# Patient Record
Sex: Female | Born: 1955 | Race: White | Hispanic: No | Marital: Married | Smoking: Never smoker
Health system: Southern US, Community
[De-identification: ages and names within clinical notes are randomized; demographics above are authoritative.]

## PROBLEM LIST (undated history)

## (undated) DIAGNOSIS — Z923 Personal history of irradiation: Secondary | ICD-10-CM

## (undated) DIAGNOSIS — D649 Anemia, unspecified: Secondary | ICD-10-CM

## (undated) DIAGNOSIS — L309 Dermatitis, unspecified: Secondary | ICD-10-CM

## (undated) DIAGNOSIS — J189 Pneumonia, unspecified organism: Secondary | ICD-10-CM

## (undated) DIAGNOSIS — Z1379 Encounter for other screening for genetic and chromosomal anomalies: Secondary | ICD-10-CM

## (undated) DIAGNOSIS — M1990 Unspecified osteoarthritis, unspecified site: Secondary | ICD-10-CM

## (undated) DIAGNOSIS — C50919 Malignant neoplasm of unspecified site of unspecified female breast: Secondary | ICD-10-CM

## (undated) DIAGNOSIS — F419 Anxiety disorder, unspecified: Secondary | ICD-10-CM

## (undated) DIAGNOSIS — T7840XA Allergy, unspecified, initial encounter: Secondary | ICD-10-CM

## (undated) DIAGNOSIS — Z9221 Personal history of antineoplastic chemotherapy: Secondary | ICD-10-CM

## (undated) DIAGNOSIS — Z9889 Other specified postprocedural states: Secondary | ICD-10-CM

## (undated) DIAGNOSIS — C4491 Basal cell carcinoma of skin, unspecified: Secondary | ICD-10-CM

## (undated) DIAGNOSIS — R112 Nausea with vomiting, unspecified: Secondary | ICD-10-CM

## (undated) DIAGNOSIS — F32A Depression, unspecified: Secondary | ICD-10-CM

## (undated) DIAGNOSIS — K635 Polyp of colon: Secondary | ICD-10-CM

## (undated) DIAGNOSIS — E785 Hyperlipidemia, unspecified: Secondary | ICD-10-CM

## (undated) HISTORY — DX: Personal history of irradiation: Z92.3

## (undated) HISTORY — DX: Hyperlipidemia, unspecified: E78.5

## (undated) HISTORY — PX: BREAST LUMPECTOMY: SHX2

## (undated) HISTORY — PX: BREAST BIOPSY: SHX20

## (undated) HISTORY — DX: Encounter for other screening for genetic and chromosomal anomalies: Z13.79

## (undated) HISTORY — PX: OTHER SURGICAL HISTORY: SHX169

## (undated) HISTORY — DX: Polyp of colon: K63.5

## (undated) HISTORY — DX: Allergy, unspecified, initial encounter: T78.40XA

## (undated) HISTORY — DX: Basal cell carcinoma of skin, unspecified: C44.91

## (undated) HISTORY — DX: Anxiety disorder, unspecified: F41.9

## (undated) HISTORY — DX: Dermatitis, unspecified: L30.9

---

## 2000-05-21 HISTORY — PX: DILATION AND CURETTAGE OF UTERUS: SHX78

## 2007-03-31 ENCOUNTER — Encounter: Admission: RE | Admit: 2007-03-31 | Discharge: 2007-03-31 | Payer: Self-pay | Admitting: Neurology

## 2007-08-20 HISTORY — PX: COLONOSCOPY W/ POLYPECTOMY: SHX1380

## 2008-04-06 ENCOUNTER — Encounter: Admission: RE | Admit: 2008-04-06 | Discharge: 2008-04-06 | Payer: Self-pay | Admitting: Neurology

## 2008-05-21 HISTORY — PX: MOHS SURGERY: SUR867

## 2009-04-11 ENCOUNTER — Encounter: Admission: RE | Admit: 2009-04-11 | Discharge: 2009-04-11 | Payer: Self-pay | Admitting: Family Medicine

## 2010-04-18 ENCOUNTER — Encounter: Admission: RE | Admit: 2010-04-18 | Discharge: 2010-04-18 | Payer: Self-pay | Admitting: Family Medicine

## 2011-03-08 ENCOUNTER — Other Ambulatory Visit: Payer: Self-pay | Admitting: Neurology

## 2011-03-08 DIAGNOSIS — Z1231 Encounter for screening mammogram for malignant neoplasm of breast: Secondary | ICD-10-CM

## 2011-04-24 ENCOUNTER — Ambulatory Visit
Admission: RE | Admit: 2011-04-24 | Discharge: 2011-04-24 | Disposition: A | Discharge status: Home / Self Care | Payer: Self-pay | Source: Ambulatory Visit | Attending: Neurology | Admitting: Neurology

## 2011-04-24 DIAGNOSIS — Z1231 Encounter for screening mammogram for malignant neoplasm of breast: Secondary | ICD-10-CM

## 2012-02-19 HISTORY — PX: BUNIONECTOMY: SHX129

## 2012-04-24 ENCOUNTER — Other Ambulatory Visit: Payer: Self-pay | Admitting: Neurology

## 2012-04-24 DIAGNOSIS — Z1231 Encounter for screening mammogram for malignant neoplasm of breast: Secondary | ICD-10-CM

## 2012-04-29 ENCOUNTER — Ambulatory Visit (INDEPENDENT_AMBULATORY_CARE_PROVIDER_SITE_OTHER): Payer: 59

## 2012-04-29 DIAGNOSIS — Z1231 Encounter for screening mammogram for malignant neoplasm of breast: Secondary | ICD-10-CM

## 2013-01-20 DIAGNOSIS — M8580 Other specified disorders of bone density and structure, unspecified site: Secondary | ICD-10-CM | POA: Insufficient documentation

## 2013-01-21 DIAGNOSIS — M21619 Bunion of unspecified foot: Secondary | ICD-10-CM | POA: Insufficient documentation

## 2013-03-24 ENCOUNTER — Other Ambulatory Visit: Payer: Self-pay | Admitting: Family Medicine

## 2013-03-24 DIAGNOSIS — Z1231 Encounter for screening mammogram for malignant neoplasm of breast: Secondary | ICD-10-CM

## 2013-04-30 ENCOUNTER — Ambulatory Visit (INDEPENDENT_AMBULATORY_CARE_PROVIDER_SITE_OTHER): Payer: 59

## 2013-04-30 DIAGNOSIS — Z1231 Encounter for screening mammogram for malignant neoplasm of breast: Secondary | ICD-10-CM

## 2013-07-20 DIAGNOSIS — E039 Hypothyroidism, unspecified: Secondary | ICD-10-CM | POA: Insufficient documentation

## 2013-07-20 DIAGNOSIS — E038 Other specified hypothyroidism: Secondary | ICD-10-CM | POA: Insufficient documentation

## 2014-03-26 ENCOUNTER — Other Ambulatory Visit: Payer: Self-pay | Admitting: Family Medicine

## 2014-03-26 DIAGNOSIS — Z1231 Encounter for screening mammogram for malignant neoplasm of breast: Secondary | ICD-10-CM

## 2014-05-05 ENCOUNTER — Ambulatory Visit (INDEPENDENT_AMBULATORY_CARE_PROVIDER_SITE_OTHER): Payer: 59

## 2014-05-05 DIAGNOSIS — Z1231 Encounter for screening mammogram for malignant neoplasm of breast: Secondary | ICD-10-CM

## 2014-08-02 LAB — BASIC METABOLIC PANEL WITH GFR
CALCIUM: 9 mg/dL
EGFR (Non-African Amer.): >60

## 2014-08-02 LAB — BASIC METABOLIC PANEL
BUN: 13 mg/dL (ref 4–21)
Creatinine: 0.7 mg/dL (ref <=1.1)
GLUCOSE: 73 mg/dL
Potassium: 4.1 mmol/L (ref 3.4–5.3)
SODIUM: 138 mmol/L (ref 137–147)

## 2014-08-02 LAB — CBC AND DIFFERENTIAL
HEMATOCRIT: 39 % (ref 36–46)
HEMOGLOBIN: 12.5 g/dL (ref 12.0–16.0)
Platelets: 284 10*3/uL (ref 150–399)
WBC: 5.6 10^3/mL

## 2014-08-02 LAB — LIPID PANEL
CHOLESTEROL: 189 mg/dL (ref 0–200)
HDL: 62 mg/dL (ref 35–70)
LDL CALC: 124 mg/dL
TRIGLYCERIDES: 71 mg/dL (ref 40–160)

## 2014-08-02 LAB — HEPATIC FUNCTION PANEL
ALT: 16 U/L (ref 7–35)
AST: 19 U/L (ref 13–35)

## 2015-04-21 ENCOUNTER — Other Ambulatory Visit: Payer: Self-pay | Admitting: Neurology

## 2015-04-21 DIAGNOSIS — Z1231 Encounter for screening mammogram for malignant neoplasm of breast: Secondary | ICD-10-CM

## 2015-05-09 ENCOUNTER — Ambulatory Visit (HOSPITAL_BASED_OUTPATIENT_CLINIC_OR_DEPARTMENT_OTHER)
Admission: RE | Admit: 2015-05-09 | Discharge: 2015-05-09 | Disposition: A | Discharge status: Home / Self Care | Payer: Managed Care, Other (non HMO) | Source: Ambulatory Visit | Attending: Neurology | Admitting: Neurology

## 2015-05-09 DIAGNOSIS — Z1231 Encounter for screening mammogram for malignant neoplasm of breast: Secondary | ICD-10-CM | POA: Insufficient documentation

## 2015-05-09 DIAGNOSIS — R928 Other abnormal and inconclusive findings on diagnostic imaging of breast: Secondary | ICD-10-CM | POA: Insufficient documentation

## 2015-05-11 ENCOUNTER — Other Ambulatory Visit: Payer: Self-pay | Admitting: Neurology

## 2015-05-11 DIAGNOSIS — R928 Other abnormal and inconclusive findings on diagnostic imaging of breast: Secondary | ICD-10-CM

## 2015-05-25 ENCOUNTER — Ambulatory Visit
Admission: RE | Admit: 2015-05-25 | Discharge: 2015-05-25 | Disposition: A | Discharge status: Home / Self Care | Payer: Managed Care, Other (non HMO) | Source: Ambulatory Visit | Attending: Neurology | Admitting: Neurology

## 2015-05-25 DIAGNOSIS — R928 Other abnormal and inconclusive findings on diagnostic imaging of breast: Secondary | ICD-10-CM

## 2015-08-04 ENCOUNTER — Encounter: Payer: Self-pay | Admitting: Osteopathic Medicine

## 2015-08-04 ENCOUNTER — Ambulatory Visit (INDEPENDENT_AMBULATORY_CARE_PROVIDER_SITE_OTHER): Payer: Managed Care, Other (non HMO) | Admitting: Osteopathic Medicine

## 2015-08-04 VITALS — BP 111/61 | HR 70 | Ht 66.0 in | Wt 152.0 lb

## 2015-08-04 DIAGNOSIS — Z1159 Encounter for screening for other viral diseases: Secondary | ICD-10-CM

## 2015-08-04 DIAGNOSIS — R946 Abnormal results of thyroid function studies: Secondary | ICD-10-CM

## 2015-08-04 DIAGNOSIS — Z79899 Other long term (current) drug therapy: Secondary | ICD-10-CM

## 2015-08-04 DIAGNOSIS — Z131 Encounter for screening for diabetes mellitus: Secondary | ICD-10-CM

## 2015-08-04 DIAGNOSIS — Z0000 Encounter for general adult medical examination without abnormal findings: Secondary | ICD-10-CM | POA: Diagnosis not present

## 2015-08-04 DIAGNOSIS — Z13220 Encounter for screening for lipoid disorders: Secondary | ICD-10-CM | POA: Diagnosis not present

## 2015-08-04 DIAGNOSIS — M8580 Other specified disorders of bone density and structure, unspecified site: Secondary | ICD-10-CM

## 2015-08-04 DIAGNOSIS — Z114 Encounter for screening for human immunodeficiency virus [HIV]: Secondary | ICD-10-CM

## 2015-08-04 NOTE — Progress Notes (Signed)
HPI: Jamie [name] is a 60 y.o. female who presents to [place] today for chief complaint of:  Chief Complaint  Patient presents with  . Establish Care    request annual wellness checkup    No complaints, brings previous lab records, preventive care reviewed as below. Reports hx abnormal thyroid test in remote past. (+)FH ovarian cancer in mom, no breast cancer, following with OBGYN.   Past medical, social and family history reviewed: History reviewed. No pertinent past medical history.st medical history. Past Surgical History  Procedure Laterality Date  . Bunionectomy  10/13  . Mohs surgery  2010  . Colonoscopy w/ polypectomy  08/2007  . Dilation and curettage of uterus  2002  . Bone spur  2001 AND 1988   Social History  Substance Use Topics  . Smoking status: Never Smoker   . Smokeless tobacco: Not on file  . Alcohol Use: Not on file   Family History  Problem Relation Age of Onset  . Cancer Mother   . Hypertension Father   . Stroke Father   . Cancer Maternal Aunt   . Heart disease Father   . Heart disease Paternal Grandmother   . Osteoporosis Sister     Current Outpatient Prescriptions  Medication Sig Dispense Refill  . Cholecalciferol (VITAMIN D3) 10000 units TABS Take by mouth. TAKE 1 TABLET TWICE A DAY    . Cyanocobalamin (B-12) 2500 MCG TABS Take by mouth.    . Ginkgo Biloba 40 MG TABS Take 120 mg by mouth daily.    . Red Yeast Rice Extract 600 MG CAPS Take 600 mg by mouth.    [name] UNABLE TO FIND 400 mg.     No current facility-administered medications for this visit.   Allergies  Allergen Reactions  . Codeine Other (See Comments)    unknown  . Sulfamethoxazole Other (See Comments)    unknown      Review of Systems: CONSTITUTIONAL:  No  fever, no chills, No  unintentional weight changes HEAD/EYES/EARS/NOSE/THROAT: No  headache, no vision change, no hearing change, No  sore throat, No  sinus pressure CARDIAC: No  chest  pain, No  pressure, No palpitations, No  orthopnea RESPIRATORY: No  cough, No  shortness of breath/wheeze GASTROINTESTINAL: No  nausea, No  vomiting, No  abdominal pain, No  blood in stool, No  diarrhea, No  constipation  MUSCULOSKELETAL: No  myalgia/arthralgia GENITOURINARY: No  incontinence, No  abnormal genital bleeding/discharge SKIN: No  rash/wounds/concerning lesions HEM/ONC: No  easy bruising/bleeding, No  abnormal lymph node ENDOCRINE: No polyuria/polydipsia/polyphagia, No  heat/cold intolerance  NEUROLOGIC: No  weakness, No  dizziness, No  slurred speech PSYCHIATRIC: No  concerns with depression, No  concerns with anxiety, No sleep problems  Exam:  BP 111/61 mmHg  Pulse 70  Ht 5\' 6"  (1.676 m)  Wt 152 lb (68.947 kg)  BMI 24.55 kg/m2 Constitutional: VS see above. General Appearance: alert, well-developed, well-nourished, NAD Eyes: Normal lids and conjunctive, non-icteric sclera, PERRLA Ears, Nose, Mouth, Throat: MMM, Normal external inspection ears/nares/mouth/lips/gums, TM normal bilaterally. Pharynx no erythema, no exudate.  Neck: No masses, trachea midline. No thyroid enlargement/tenderness/mass appreciated. No lymphadenopathy Respiratory: Normal respiratory effort. no wheeze, no rhonchi, no rales Cardiovascular: S1/S2 normal, no murmur, no rub/gallop auscultated. RRR. No lower extremity edema. Gastrointestinal: Nontender, no masses. No hepatomegaly, no splenomegaly. No hernia appreciated. Bowel sounds normal. Rectal exam deferred.  Musculoskeletal: Gait normal. No clubbing/cyanosis of digits.  Neurological: No cranial nerve deficit  on limited exam. Motor and sensation intact and symmetric Skin: warm, dry, intact. No rash/ulcer. No concerning nevi or subq nodules on limited exam.   Psychiatric: Normal judgment/insight. Normal mood and affect. Oriented x3.     ASSESSMENT/PLAN: See pt instructions   Annual physical exam  Osteopenia - Plan: VITAMIN D 25 Hydroxy (Vit-D  Deficiency, Fractures)  Screening, lipid - Plan: Lipid panel  Screening for diabetes mellitus  Medication management - Plan: COMPLETE METABOLIC PANEL WITH GFR, CBC with Differential/Platelet  Abnormal thyroid exam - Plan: TSH  Encounter for screening for HIV - Plan: HIV antibody  Need for hepatitis C screening test - Plan: Hepatitis C antibody   FEMALE PREVENTIVE CARE  ANNUAL SCREENING/COUNSELING Tobacco - Never  Alcohol - social drinker Diet/Exercise - HEALTHY HABITS DISCUSSED TO DECREASE CV RISK -  Sexual Health - Yes with female. STI - The patient denies history of sexually transmitted disease. INTERESTED IN STI TESTING - no  Depression - PQH2 Negative Domestic violence concerns - no HTN SCREENING - SEE VITALS Vaccination status - SEE BELOW  INFECTIOUS DISEASE SCREENING HIV - all adults 15-65 - needs GC/CT - sexually active - does not need HepC - born 38-1966 - needs TB - if risk/required by employer - does not need  DISEASE SCREENING Lipid - (Low risk screen M35/F45; High risk screen M25/F35 if HTN, Tob, FH CHD M<55/F<65) - needs DM2 (45+ or Risk = FH 1st deg DM, Hx GDM, overweight/sedentary, high-risk ethnicity, HTN) - needs Osteoporosis - age 30+ or one sooner if risk - needs in one year (last one done 09/01/14 positive osteoporosis)   CANCER SCREENING Cervical - Pap q3 yr age 59+, Pap + HPV q5y age 68+ - PAP - does not need Breast - Mammo age 83+ (C) and biennial age 61-75 (A) - 17 - does not need Lung - annual low dose CT Chest age 20-75 w/ 30+ PY, current/quit past 15 years - CT - does not need Colon - age 36+ or 61 years of age prior to [place] Dx - GI REFERRAL - does not need  ADULT VACCINATION Influenza - annual - already has Td booster every 10 years - already has HPV - age <64yo - was not indicated Zoster - age 44+ - already has Pneumonia - age 51+ sooner if risk (DM, smoker, other) - was not indicated  OTHER Fall - exercise and Vit D age 40+ - does not  need Consider ASA - age 63-59 - does not need   Return in about 1 year (around 08/03/2016), or sooner if needed, for [employer].

## 2015-08-04 NOTE — Patient Instructions (Signed)
OTHER PREVENTIVE CARE Plan to repeat bone density test 2018 - Vitamin D daily [phone] IU daily, Calcium 1200-1500 mg daily - total from food or spuplements  Skeletal Strength supplements have Vit D 100 IU and Calcium 300 mg Plan to repeat Pap test age 61 if Pap + HPV test were negative Repeat cholesterol and diabetes screening in 1 year Repeat mammogram in 1 year Plan for repeat colonoscopy 2019 or can discuss Cologuard screening at that time Tetanus booster due in 2022 Pneumonia vaccine series starts age 83 Flu vaccine annually - call our office so we can document when you get this elsewhere, or you can get this done here!   We will call you about lab results - please get these done fasting within the next few weeks.  Assuming all is ok - folow up in 1 year for repeat annual wellness exam! Come back sooner if needed! Take care! -Dr. Loni Muse.

## 2015-08-05 LAB — COMPLETE METABOLIC PANEL WITH GFR
ALT: 18 U/L (ref 6–29)
AST: 22 U/L (ref 10–35)
Albumin: 4.3 g/dL (ref 3.6–5.1)
Alkaline Phosphatase: 50 U/L (ref 33–130)
BILIRUBIN TOTAL: 0.4 mg/dL (ref 0.2–1.2)
BUN: 12 mg/dL (ref 7–25)
CO2: 29 mmol/L (ref 20–31)
CREATININE: 0.76 mg/dL (ref 0.50–0.99)
Calcium: 9.2 mg/dL (ref 8.6–10.4)
Chloride: 100 mmol/L (ref 98–110)
GFR, Est African American: >89 mL/min (ref >=60)
GFR, Est Non African American: 86 mL/min (ref >=60)
GLUCOSE: 84 mg/dL (ref 65–99)
Potassium: 4.2 mmol/L (ref 3.5–5.3)
SODIUM: 136 mmol/L (ref 135–146)
TOTAL PROTEIN: 6.7 g/dL (ref 6.1–8.1)

## 2015-08-05 LAB — LIPID PANEL
CHOLESTEROL: 195 mg/dL (ref 125–200)
HDL: 65 mg/dL (ref >=46)
LDL Cholesterol: 108 mg/dL (ref <130)
TRIGLYCERIDES: 112 mg/dL (ref <150)
Total CHOL/HDL Ratio: 3 Ratio (ref <=5.0)
VLDL: 22 mg/dL (ref <30)

## 2015-08-05 LAB — TSH: TSH: 4.44 mIU/L

## 2015-08-06 LAB — CBC WITH DIFFERENTIAL/PLATELET
Basophils Absolute: 0.1 10*3/uL (ref 0.0–0.1)
Basophils Relative: 1 % (ref 0–1)
Eosinophils Absolute: 0.3 10*3/uL (ref 0.0–0.7)
Eosinophils Relative: 5 % (ref 0–5)
HEMATOCRIT: 39.3 % (ref 36.0–46.0)
HEMOGLOBIN: 12.7 g/dL (ref 12.0–15.0)
LYMPHS ABS: 2.4 10*3/uL (ref 0.7–4.0)
LYMPHS PCT: 43 % (ref 12–46)
MCH: 30.2 pg (ref 26.0–34.0)
MCHC: 32.3 g/dL (ref 30.0–36.0)
MCV: 93.6 fL (ref 78.0–100.0)
MONO ABS: 0.4 10*3/uL (ref 0.1–1.0)
MONOS PCT: 7 % (ref 3–12)
MPV: 9.7 fL (ref 8.6–12.4)
NEUTROS ABS: 2.5 10*3/uL (ref 1.7–7.7)
Neutrophils Relative %: 44 % (ref 43–77)
PLATELETS: 343 10*3/uL (ref 150–400)
RBC: 4.2 MIL/uL (ref 3.87–5.11)
RDW: 13.1 % (ref 11.5–15.5)
WBC: 5.6 10*3/uL (ref 4.0–10.5)

## 2015-08-06 LAB — HEPATITIS C ANTIBODY: HCV Ab: NEGATIVE

## 2015-08-06 LAB — HIV ANTIBODY (ROUTINE TESTING W REFLEX): HIV 1&2 Ab, 4th Generation: NONREACTIVE

## 2015-08-06 LAB — VITAMIN D 25 HYDROXY (VIT D DEFICIENCY, FRACTURES): VIT D 25 HYDROXY: 47 ng/mL (ref 30–100)

## 2015-08-10 ENCOUNTER — Encounter: Payer: Self-pay | Admitting: Osteopathic Medicine

## 2016-04-25 ENCOUNTER — Other Ambulatory Visit: Payer: Self-pay | Admitting: Neurology

## 2016-04-25 ENCOUNTER — Other Ambulatory Visit: Payer: Self-pay | Admitting: Urology

## 2016-04-25 DIAGNOSIS — Z1231 Encounter for screening mammogram for malignant neoplasm of breast: Secondary | ICD-10-CM

## 2016-06-12 ENCOUNTER — Ambulatory Visit: Payer: Managed Care, Other (non HMO)

## 2016-06-13 ENCOUNTER — Ambulatory Visit (INDEPENDENT_AMBULATORY_CARE_PROVIDER_SITE_OTHER): Payer: Managed Care, Other (non HMO) | Admitting: Osteopathic Medicine

## 2016-06-13 ENCOUNTER — Encounter: Payer: Self-pay | Admitting: Osteopathic Medicine

## 2016-06-13 ENCOUNTER — Ambulatory Visit (INDEPENDENT_AMBULATORY_CARE_PROVIDER_SITE_OTHER): Payer: Managed Care, Other (non HMO)

## 2016-06-13 VITALS — BP 109/68 | HR 93 | Ht 66.0 in | Wt 151.0 lb

## 2016-06-13 DIAGNOSIS — L309 Dermatitis, unspecified: Secondary | ICD-10-CM | POA: Insufficient documentation

## 2016-06-13 DIAGNOSIS — J209 Acute bronchitis, unspecified: Secondary | ICD-10-CM | POA: Diagnosis not present

## 2016-06-13 DIAGNOSIS — R05 Cough: Secondary | ICD-10-CM | POA: Diagnosis not present

## 2016-06-13 DIAGNOSIS — Z86010 Personal history of colonic polyps: Secondary | ICD-10-CM | POA: Insufficient documentation

## 2016-06-13 DIAGNOSIS — E785 Hyperlipidemia, unspecified: Secondary | ICD-10-CM | POA: Insufficient documentation

## 2016-06-13 DIAGNOSIS — G2581 Restless legs syndrome: Secondary | ICD-10-CM | POA: Insufficient documentation

## 2016-06-13 DIAGNOSIS — M722 Plantar fascial fibromatosis: Secondary | ICD-10-CM | POA: Insufficient documentation

## 2016-06-13 DIAGNOSIS — J309 Allergic rhinitis, unspecified: Secondary | ICD-10-CM | POA: Insufficient documentation

## 2016-06-13 DIAGNOSIS — R059 Cough, unspecified: Secondary | ICD-10-CM

## 2016-06-13 DIAGNOSIS — Z85828 Personal history of other malignant neoplasm of skin: Secondary | ICD-10-CM | POA: Insufficient documentation

## 2016-06-13 MED ORDER — BUDESONIDE-FORMOTEROL FUMARATE 160-4.5 MCG/ACT IN AERO: 2.0000 | INHALATION_SPRAY | Freq: Two times a day (BID) | RESPIRATORY_TRACT | 1 refills | Status: DC

## 2016-06-13 MED ORDER — BENZONATATE 200 MG PO CAPS: 200.0000 mg | ORAL_CAPSULE | Freq: Three times a day (TID) | ORAL | 1 refills | Status: AC | PRN

## 2016-06-13 MED ORDER — MOMETASONE FURO-FORMOTEROL FUM 200-5 MCG/ACT IN AERO: 2.0000 | INHALATION_SPRAY | Freq: Two times a day (BID) | RESPIRATORY_TRACT | 0 refills | Status: DC

## 2016-06-13 NOTE — Patient Instructions (Addendum)
Cough & Sore Throat Prescription cough pills or syrups Dextromethorphan (Robitussin, others) - cough suppressant Guaifenesin (Robitussin, Mucinex, others) - expectorant (helps cough up mucus) (Dextromethorphan and Guaifenesin also come in a combination tablet) Lozenges w/ Benzocaine + Menthol (Cepacol) Honey - as much as you want! Teas which "coat the throat" - look for ingredients Elm Bark, Licorice Root, Marshmallow Root       Acute Bronchitis, Adult Acute bronchitis is sudden (acute) swelling of the air tubes (bronchi) in the lungs. Acute bronchitis causes these tubes to fill with mucus, which can make it hard to breathe. It can also cause coughing or wheezing. In adults, acute bronchitis usually goes away within 2 weeks. A cough caused by bronchitis may last up to 3 weeks. Smoking, allergies, and asthma can make the condition worse. Repeated episodes of bronchitis may cause further lung problems, such as chronic obstructive pulmonary disease (COPD). What are the causes? This condition can be caused by germs and by substances that irritate the lungs, including:  Cold and flu viruses. This condition is most often caused by the same virus that causes a cold.  Bacteria.  Exposure to tobacco smoke, dust, fumes, and air pollution. What increases the risk? This condition is more likely to develop in people who:  Have close contact with someone with acute bronchitis.  Are exposed to lung irritants, such as tobacco smoke, dust, fumes, and vapors.  Have a weak immune system.  Have a respiratory condition such as asthma. What are the signs or symptoms? Symptoms of this condition include:  A cough.  Coughing up clear, yellow, or green mucus.  Wheezing.  Chest congestion.  Shortness of breath.  A fever.  Body aches.  Chills.  A sore throat. How is this diagnosed? This condition is usually diagnosed with a physical exam. During the exam, your health care provider may  order tests, such as chest X-rays, to rule out other conditions. He or she may also:  Test a sample of your mucus for bacterial infection.  Check the level of oxygen in your blood. This is done to check for pneumonia.  Do a chest X-ray or lung function testing to rule out pneumonia and other conditions.  Perform blood tests. Your health care provider will also ask about your symptoms and medical history. How is this treated? Most cases of acute bronchitis clear up over time without treatment. Your health care provider may recommend:  Drinking more fluids. Drinking more makes your mucus thinner, which may make it easier to breathe.  Taking a medicine for a fever or cough.  Taking an antibiotic medicine.  Using an inhaler to help improve shortness of breath and to control a cough.  Using a cool mist vaporizer or humidifier to make it easier to breathe. Follow these instructions at home: Medicines  Take over-the-counter and prescription medicines only as told by your health care provider.  If you were prescribed an antibiotic, take it as told by your health care provider. Do not stop taking the antibiotic even if you start to feel better. General instructions  Get plenty of rest.  Drink enough fluids to keep your urine clear or pale yellow.  Avoid smoking and secondhand smoke. Exposure to cigarette smoke or irritating chemicals will make bronchitis worse. If you smoke and you need help quitting, ask your health care provider. Quitting smoking will help your lungs heal faster.  Use an inhaler, cool mist vaporizer, or humidifier as told by your health care provider.  Keep  all follow-up visits as told by your health care provider. This is important. How is this prevented? To lower your risk of getting this condition again:  Wash your hands often with soap and water. If soap and water are not available, use hand sanitizer.  Avoid contact with people who have cold symptoms.  Try  not to touch your hands to your mouth, nose, or eyes.  Make sure to get the flu shot every year. Contact a health care provider if:  Your symptoms do not improve in 2 weeks of treatment. Get help right away if:  You cough up blood.  You have chest pain.  You have severe shortness of breath.  You become dehydrated.  You faint or keep feeling like you are going to faint.  You keep vomiting.  You have a severe headache.  Your fever or chills gets worse. This information is not intended to replace advice given to you by your health care provider. Make sure you discuss any questions you have with your health care provider. Document Released: 06/14/2004 Document Revised: 11/30/2015 Document Reviewed: 10/26/2015 Elsevier Interactive Patient Education  2017 [employer].

## 2016-06-13 NOTE — Progress Notes (Signed)
HPI: Jamie [name] is a 61 y.o. female who presents to [facility] 06/13/16 for chief complaint of:  Chief Complaint  Patient presents with  . Cough   Acute Illness: . Context: see below for records reviewed. Pt states cough not gone though it is better and not keeping her up at night anymore.  . Location:  . Quality: dry mostly but occasionally minimally productive  . Assoc signs/symptoms: see ROS . Duration: 3 weeks or so total . Modifying factors: has tried the following OTC/Rx medications: Rx from [facility], see below  Records reviewed: Patient seen in urgent care 06/07/2016 (6 days ago). Symptoms for about 2 weeks, diagnosed with bronchitis with symptoms of cough, sinus congestion, fatigue/aches, occasional productive phlegm, wheezing/SOB with cough. Lung exam noted mild rhonchi bilaterally without rales. New prescriptions included benzonatate, doxycycline, fluconazole for yeast infection, prednisone, promethazine. Nebulized medication was given in the office.   Past medical, social and family history reviewed. Current medications and allergies reviewed.     Review of Systems:  Constitutional: No  Fever/chills, Yes achiness and significant fatigue  HEENT: Yes  headache, No  sore throat, No  swollen glands  Cardiovascular: No chest pain  Respiratory:Yes  cough, Yes  shortness of breath  Gastrointestinal: No  nausea, No  vomiting,  No  diarrhea  Musculoskeletal:   Yes  myalgia/arthralgia  Skin/Integument:  No  rash   Exam:  BP 109/68   Pulse 93   Ht 5\' 6"  (1.676 m)   Wt 151 lb (68.5 kg)   SpO2 96%   BMI 24.37 kg/m   Constitutional: VSS, see above. General Appearance: alert, well-developed, well-nourished, NAD  Eyes: Normal lids and conjunctive, non-icteric sclera, PERRLA  Ears, Nose, Mouth, Throat: Normal external inspection ears/nares/mouth/lips/gums, normal TM, MMM; posterior pharynx without erythema, without exudate, nasal mucosa  normal  Neck: No masses, trachea midline. normal lymph nodes  Respiratory: Normal respiratory effort. No  wheeze/rhonchi/rales  Cardiovascular: S1/S2 normal, no murmur/rub/gallop auscultated. RRR.    CXR personally reviewed with patient, ?LLL atelectasis/scarring but no obvious PNA/edema/effusion/mass, radiology overread below.   Dg Chest 2 View  Result Date: 06/13/2016 CLINICAL DATA:  Cough for 3 weeks EXAM: CHEST  2 VIEW COMPARISON:  None. FINDINGS: There is linear scarring or atelectasis in the lingula. No definite pneumonia or effusion is seen. Mediastinal and hilar contours are unremarkable. The heart is within normal limits in size. No bony abnormality is seen. IMPRESSION: Linear scarring or atelectasis in the lingula. No definite active process. Electronically Signed   By: Ivar Drape M.D.   On: 06/13/2016 10:29     ASSESSMENT/PLAN: Patient advised likely normal course of viral bronchitis, doxycycline should have covered for any pathogens for community acquired pneumonia and sinusitis, would go ahead and complete the course of antibiotics. Education provided also however that antibiotics will not help viral infection which this may also be. Addition of inhaler may be helpful. Overall, cough is improved. Optimize nutrition and hydration. Reassurance that this will likely run its course but ER/RTC precautions were reviewed.   Acute bronchitis, unspecified organism - Plan: DG Chest 2 View, mometasone-formoterol (DULERA) 200-5 MCG/ACT AERO  Cough - Plan: DG Chest 2 View, benzonatate (TESSALON) 200 MG capsule     Patient Instructions   Cough & Sore Throat Prescription cough pills or syrups Dextromethorphan (Robitussin, others) - cough suppressant Guaifenesin (Robitussin, Mucinex, others) - expectorant (helps cough up mucus) (Dextromethorphan and Guaifenesin also come in a combination tablet) Lozenges w/ Benzocaine +  Menthol (Cepacol) Honey - as much as you want! Teas which "coat  the throat" - look for ingredients Elm Bark, Licorice Root, Marshmallow Root       Acute Bronchitis, Adult Acute bronchitis is sudden (acute) swelling of the air tubes (bronchi) in the lungs. Acute bronchitis causes these tubes to fill with mucus, which can make it hard to breathe. It can also cause coughing or wheezing. In adults, acute bronchitis usually goes away within 2 weeks. A cough caused by bronchitis may last up to 3 weeks. Smoking, allergies, and asthma can make the condition worse. Repeated episodes of bronchitis may cause further lung problems, such as chronic obstructive pulmonary disease (COPD). What are the causes? This condition can be caused by germs and by substances that irritate the lungs, including:  Cold and flu viruses. This condition is most often caused by the same virus that causes a cold.  Bacteria.  Exposure to tobacco smoke, dust, fumes, and air pollution. What increases the risk? This condition is more likely to develop in people who:  Have close contact with someone with acute bronchitis.  Are exposed to lung irritants, such as tobacco smoke, dust, fumes, and vapors.  Have a weak immune system.  Have a respiratory condition such as asthma. What are the signs or symptoms? Symptoms of this condition include:  A cough.  Coughing up clear, yellow, or green mucus.  Wheezing.  Chest congestion.  Shortness of breath.  A fever.  Body aches.  Chills.  A sore throat. How is this diagnosed? This condition is usually diagnosed with a physical exam. During the exam, your health care provider may order tests, such as chest X-rays, to rule out other conditions. He or she may also:  Test a sample of your mucus for bacterial infection.  Check the level of oxygen in your blood. This is done to check for pneumonia.  Do a chest X-ray or lung function testing to rule out pneumonia and other conditions.  Perform blood tests. Your health care  provider will also ask about your symptoms and medical history. How is this treated? Most cases of acute bronchitis clear up over time without treatment. Your health care provider may recommend:  Drinking more fluids. Drinking more makes your mucus thinner, which may make it easier to breathe.  Taking a medicine for a fever or cough.  Taking an antibiotic medicine.  Using an inhaler to help improve shortness of breath and to control a cough.  Using a cool mist vaporizer or humidifier to make it easier to breathe. Follow these instructions at home: Medicines  Take over-the-counter and prescription medicines only as told by your health care provider.  If you were prescribed an antibiotic, take it as told by your health care provider. Do not stop taking the antibiotic even if you start to feel better. General instructions  Get plenty of rest.  Drink enough fluids to keep your urine clear or pale yellow.  Avoid smoking and secondhand smoke. Exposure to cigarette smoke or irritating chemicals will make bronchitis worse. If you smoke and you need help quitting, ask your health care provider. Quitting smoking will help your lungs heal faster.  Use an inhaler, cool mist vaporizer, or humidifier as told by your health care provider.  Keep all follow-up visits as told by your health care provider. This is important. How is this prevented? To lower your risk of getting this condition again:  Wash your hands often with soap and water. If soap  and water are not available, use hand sanitizer.  Avoid contact with people who have cold symptoms.  Try not to touch your hands to your mouth, nose, or eyes.  Make sure to get the flu shot every year. Contact a health care provider if:  Your symptoms do not improve in 2 weeks of treatment. Get help right away if:  You cough up blood.  You have chest pain.  You have severe shortness of breath.  You become dehydrated.  You faint or keep  feeling like you are going to faint.  You keep vomiting.  You have a severe headache.  Your fever or chills gets worse. This information is not intended to replace advice given to you by your health care provider. Make sure you discuss any questions you have with your health care provider. Document Released: 06/14/2004 Document Revised: 11/30/2015 Document Reviewed: 10/26/2015 Elsevier Interactive Patient Education  2017 [employer].     Visit summary was printed for the patient with medications and pertinent instructions for patient to review. ER/RTC precautions reviewed. All questions answered. Return if symptoms worsen or fail to improve.

## 2016-06-13 NOTE — Addendum Note (Signed)
Addended by: Maryla Morrow on: 06/13/2016 03:10 PM   Modules accepted: Orders

## 2016-06-21 DIAGNOSIS — C50919 Malignant neoplasm of unspecified site of unspecified female breast: Secondary | ICD-10-CM

## 2016-06-21 HISTORY — DX: Malignant neoplasm of unspecified site of unspecified female breast: C50.919

## 2016-06-22 ENCOUNTER — Ambulatory Visit
Admission: RE | Admit: 2016-06-22 | Discharge: 2016-06-22 | Disposition: A | Discharge status: Home / Self Care | Payer: 59 | Source: Ambulatory Visit | Attending: Urology | Admitting: Urology

## 2016-06-22 DIAGNOSIS — Z1231 Encounter for screening mammogram for malignant neoplasm of breast: Secondary | ICD-10-CM

## 2016-06-25 ENCOUNTER — Other Ambulatory Visit: Payer: Self-pay | Admitting: Urology

## 2016-06-25 DIAGNOSIS — R928 Other abnormal and inconclusive findings on diagnostic imaging of breast: Secondary | ICD-10-CM

## 2016-07-02 ENCOUNTER — Ambulatory Visit
Admission: RE | Admit: 2016-07-02 | Discharge: 2016-07-02 | Disposition: A | Discharge status: Home / Self Care | Payer: 59 | Source: Ambulatory Visit | Attending: Urology | Admitting: Urology

## 2016-07-02 ENCOUNTER — Other Ambulatory Visit: Payer: Self-pay | Admitting: Urology

## 2016-07-02 DIAGNOSIS — N6320 Unspecified lump in the left breast, unspecified quadrant: Secondary | ICD-10-CM

## 2016-07-02 DIAGNOSIS — R928 Other abnormal and inconclusive findings on diagnostic imaging of breast: Secondary | ICD-10-CM

## 2016-07-03 ENCOUNTER — Ambulatory Visit
Admission: RE | Admit: 2016-07-03 | Discharge: 2016-07-03 | Disposition: A | Discharge status: Home / Self Care | Payer: 59 | Source: Ambulatory Visit | Attending: Urology | Admitting: Urology

## 2016-07-03 ENCOUNTER — Other Ambulatory Visit: Payer: Self-pay | Admitting: Urology

## 2016-07-03 DIAGNOSIS — R928 Other abnormal and inconclusive findings on diagnostic imaging of breast: Secondary | ICD-10-CM

## 2016-07-03 DIAGNOSIS — N6320 Unspecified lump in the left breast, unspecified quadrant: Secondary | ICD-10-CM

## 2016-07-05 ENCOUNTER — Telehealth: Payer: Self-pay | Admitting: *Deleted

## 2016-07-05 NOTE — Telephone Encounter (Signed)
Confirmed [facility] for 07/11/16 at 0815 .  Instructions and contact information given.

## 2016-07-10 ENCOUNTER — Other Ambulatory Visit: Payer: Self-pay | Admitting: *Deleted

## 2016-07-10 DIAGNOSIS — Z170 Estrogen receptor positive status [ER+]: Principal | ICD-10-CM

## 2016-07-10 DIAGNOSIS — C50412 Malignant neoplasm of upper-outer quadrant of left female breast: Secondary | ICD-10-CM

## 2016-07-11 ENCOUNTER — Other Ambulatory Visit (HOSPITAL_BASED_OUTPATIENT_CLINIC_OR_DEPARTMENT_OTHER): Payer: 59

## 2016-07-11 ENCOUNTER — Ambulatory Visit: Payer: 59 | Attending: General Surgery | Admitting: Physical Therapy

## 2016-07-11 ENCOUNTER — Encounter: Payer: Self-pay | Admitting: Genetic Counselor

## 2016-07-11 ENCOUNTER — Ambulatory Visit (HOSPITAL_BASED_OUTPATIENT_CLINIC_OR_DEPARTMENT_OTHER): Payer: 59 | Admitting: Oncology

## 2016-07-11 ENCOUNTER — Encounter: Payer: Self-pay | Admitting: Physical Therapy

## 2016-07-11 ENCOUNTER — Encounter: Payer: Self-pay | Admitting: *Deleted

## 2016-07-11 ENCOUNTER — Other Ambulatory Visit: Payer: Self-pay | Admitting: *Deleted

## 2016-07-11 ENCOUNTER — Ambulatory Visit
Admission: RE | Admit: 2016-07-11 | Discharge: 2016-07-11 | Disposition: A | Discharge status: Home / Self Care | Payer: 59 | Source: Ambulatory Visit | Attending: Radiation Oncology | Admitting: Radiation Oncology

## 2016-07-11 VITALS — BP 108/64 | HR 69 | Temp 98.2°F | Resp 20 | Ht 66.0 in | Wt 149.5 lb

## 2016-07-11 DIAGNOSIS — Z8041 Family history of malignant neoplasm of ovary: Secondary | ICD-10-CM

## 2016-07-11 DIAGNOSIS — Z85828 Personal history of other malignant neoplasm of skin: Secondary | ICD-10-CM

## 2016-07-11 DIAGNOSIS — R293 Abnormal posture: Secondary | ICD-10-CM

## 2016-07-11 DIAGNOSIS — Z170 Estrogen receptor positive status [ER+]: Secondary | ICD-10-CM | POA: Diagnosis not present

## 2016-07-11 DIAGNOSIS — C50412 Malignant neoplasm of upper-outer quadrant of left female breast: Secondary | ICD-10-CM

## 2016-07-11 LAB — COMPREHENSIVE METABOLIC PANEL
ALBUMIN: 4 g/dL (ref 3.5–5.0)
ALK PHOS: 59 U/L (ref 40–150)
ALT: 17 U/L (ref 0–55)
AST: 18 U/L (ref 5–34)
Anion Gap: 8 mEq/L (ref 3–11)
BUN: 9.2 mg/dL (ref 7.0–26.0)
CALCIUM: 9.7 mg/dL (ref 8.4–10.4)
CHLORIDE: 104 meq/L (ref 98–109)
CO2: 29 mEq/L (ref 22–29)
Creatinine: 0.8 mg/dL (ref 0.6–1.1)
EGFR: 82 mL/min/{1.73_m2} — AB (ref >90)
GLUCOSE: 69 mg/dL — AB (ref 70–140)
POTASSIUM: 3.8 meq/L (ref 3.5–5.1)
SODIUM: 141 meq/L (ref 136–145)
Total Bilirubin: 0.41 mg/dL (ref 0.20–1.20)
Total Protein: 7.1 g/dL (ref 6.4–8.3)

## 2016-07-11 LAB — CBC WITH DIFFERENTIAL/PLATELET
BASO%: 1.4 % (ref 0.0–2.0)
BASOS ABS: 0.1 10*3/uL (ref 0.0–0.1)
EOS ABS: 0.2 10*3/uL (ref 0.0–0.5)
EOS%: 4.1 % (ref 0.0–7.0)
HCT: 39.3 % (ref 34.8–46.6)
HEMOGLOBIN: 13.6 g/dL (ref 11.6–15.9)
LYMPH%: 31.9 % (ref 14.0–49.7)
MCH: 31.5 pg (ref 25.1–34.0)
MCHC: 34.6 g/dL (ref 31.5–36.0)
MCV: 91 fL (ref 79.5–101.0)
MONO#: 0.6 10*3/uL (ref 0.1–0.9)
MONO%: 9.6 % (ref 0.0–14.0)
NEUT#: 3.2 10*3/uL (ref 1.5–6.5)
NEUT%: 53 % (ref 38.4–76.8)
Platelets: 347 10*3/uL (ref 145–400)
RBC: 4.32 10*6/uL (ref 3.70–5.45)
RDW: 13.3 % (ref 11.2–14.5)
WBC: 6.1 10*3/uL (ref 3.9–10.3)
lymph#: 1.9 10*3/uL (ref 0.9–3.3)

## 2016-07-11 NOTE — Therapy (Signed)
[place], [name], [address] Phone: [phone]   Fax:  [phone]  Physical Therapy Evaluation  Patient Details  Name: Jamie [name] MRN: [id number] Date of Birth: [birth year] Referring Provider: Dr. [name]  Encounter Date: 07/11/2016      PT End of Session - 07/11/16 1323    Visit Number 1   Number of Visits 1   PT Start Time 1024   PT Stop Time 1034  Also saw pt from 1130-1156 for a total of 36 minutes   PT Time Calculation (min) 10 min   Activity Tolerance Patient tolerated treatment well   Behavior During Therapy [facility] for tasks assessed/performed      Past Medical History:  Diagnosis Date  . Anxiety   . Basal cell carcinoma   . Eczema     Past Surgical History:  Procedure Laterality Date  . BONE SPUR  2001 AND 1988  . BUNIONECTOMY  10/13  . COLONOSCOPY W/ POLYPECTOMY  08/2007  . DILATION AND CURETTAGE OF UTERUS  2002  . MOHS SURGERY  2010    There were no vitals filed for this visit.       Subjective Assessment - 07/11/16 1130    Subjective Patient reports she is here to be seen by her medical team for her newly diagnosed left breast cancer.   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 It is ER positive, PR negative, and HER2 positive with a Ki67 of 50%. It measures 7 mm and is located in the upper outer quadrant.   Patient Stated Goals reduce lymphedema risk and learn post op shoulder ROM HEP   Currently in Pain? No/denies            [facility] PT Assessment - 07/11/16 0001      Assessment   Medical Diagnosis Left breast cancer   Referring Provider Dr. [name]   Onset Date/Surgical Date 06/22/16   Hand Dominance Right   Prior Therapy none     Precautions   Precautions Other (comment)   Precaution Comments active cancer     Restrictions   Weight Bearing Restrictions No     Balance Screen    Has the patient fallen in the past 6 months No   Has the patient had a decrease in activity level because of a fear of falling?  No   Is the patient reluctant to leave their home because of a fear of falling?  No     Home Ecologist residence   Living Arrangements Spouse/significant other   Available Help at Discharge Family     Prior Function   Level of Independence Independent   Vocation Retired   Biomedical scientist N/A   Leisure Either gym, walking, cardio or elliptical 4-5x/week for > 30 minutes     Cognition   Overall Cognitive Status Within Functional Limits for tasks assessed     Posture/Postural Control   Posture/Postural Control Postural limitations   Postural Limitations Forward head;Rounded Shoulders     ROM / Strength   AROM / PROM / Strength AROM;Strength     AROM   AROM Assessment Site Shoulder;Cervical   Right/Left Shoulder Right;Left   Right Shoulder Extension 50 Degrees   Right Shoulder Flexion 152 Degrees   Right Shoulder ABduction 170 Degrees   Right Shoulder Internal Rotation 73 Degrees   Right Shoulder External Rotation 88 Degrees  Left Shoulder Extension 60 Degrees   Left Shoulder Flexion 154 Degrees   Left Shoulder ABduction 150 Degrees   Left Shoulder Internal Rotation 62 Degrees   Left Shoulder External Rotation 83 Degrees   Cervical Flexion WNL   Cervical Extension WNL   Cervical - Right Side Bend 50% limited with c/o tightness   Cervical - Left Side Bend WNL   Cervical - Right Rotation WNL   Cervical - Left Rotation WNL     Strength   Overall Strength Within functional limits for tasks performed           LYMPHEDEMA/ONCOLOGY QUESTIONNAIRE - 07/11/16 1204      Type   Cancer Type Left breast cancer     Lymphedema Assessments   Lymphedema Assessments Upper extremities     Right Upper Extremity Lymphedema   10 cm Proximal to Olecranon Process 29.2 cm   Olecranon Process 24.8 cm   10 cm Proximal  to Ulnar Styloid Process 23 cm   Just Proximal to Ulnar Styloid Process 15.4 cm   Across Hand at [employer] 18.6 cm   At [place] of 2nd Digit 6.2 cm     Left Upper Extremity Lymphedema   10 cm Proximal to Olecranon Process 29.1 cm   Olecranon Process 25.8 cm   10 cm Proximal to Ulnar Styloid Process 22 cm   Just Proximal to Ulnar Styloid Process 16 cm   Across Hand at [employer] 18.5 cm   At [place] of 2nd Digit 6.2 cm      Patient was instructed today in a home exercise program today for post op shoulder range of motion. These included active assist shoulder flexion in sitting, scapular retraction, wall walking with shoulder abduction, and hands behind head external rotation.  She was encouraged to do these twice a day, holding 3 seconds and repeating 5 times when permitted by her physician.         PT Education - 07/11/16 1323    Education provided Yes   Education Details Lymphedema risk reduction and post op shoulder ROM HEP   Person(s) Educated Patient;Spouse   Methods Explanation;Demonstration;Handout   Comprehension Returned demonstration;Verbalized understanding              Breast Clinic Goals - 07/11/16 1327      Patient will be able to verbalize understanding of pertinent lymphedema risk reduction practices relevant to her diagnosis specifically related to skin care.   Time 1   Period Days   Status Achieved     Patient will be able to return demonstrate and/or verbalize understanding of the post-op home exercise program related to regaining shoulder range of motion.   Time 1   Period Days   Status Achieved     Patient will be able to verbalize understanding of the importance of attending the postoperative After Breast Cancer Class for further lymphedema risk reduction education and therapeutic exercise.   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 It is ER positive, PR negative, and HER2 positive with a Ki67 of 50%. It measures 7 mm and is located in the upper outer quadrant. Her multidisciplinary medical team met prior to her assessments to determine a recommended treatment plan.  She is planning to have  genetic testing, a left lumpectomy and sentinel node biopsy followed by radiation and anti-estrogen therapy. She may benefit from post op PT to regain shoulder ROM and reduce lymphedema risk. Due to her lack of comorbidities, her eval is of low complexity.   Rehab Potential Excellent   Clinical Impairments Affecting Rehab Potential none   PT Frequency One time visit   PT Treatment/Interventions Therapeutic exercise;Patient/family education   PT Next Visit Plan Will f/u after surgery to determine PT needs   PT Home Exercise Plan post op shoulder ROM HEP   Consulted and Agree with Plan of Care Patient;Family member/caregiver   Family Member Consulted Husband      Patient will benefit from skilled therapeutic intervention in order to improve the following deficits and impairments:  Postural dysfunction, Decreased knowledge of precautions, Pain, Impaired UE functional use, Decreased range of motion  Visit Diagnosis: Carcinoma of upper-outer quadrant of left breast in female, estrogen receptor positive ([place]) - Plan: PT plan of care cert/re-cert  Abnormal posture - Plan: PT plan of care cert/re-cert   Patient will follow up at outpatient cancer rehab if needed following surgery.  If the patient requires physical therapy at that time, a specific plan will be dictated and sent to the referring physician for approval. The patient was educated today on appropriate basic range of motion exercises to begin post operatively and the importance of attending the After Breast Cancer class following surgery.  Patient was educated today on lymphedema risk reduction practices as it pertains to  recommendations that will benefit the patient immediately following surgery.  She verbalized good understanding.  No additional physical therapy is indicated at this time.      Problem List Patient Active Problem List   Diagnosis Date Noted  . Malignant neoplasm of upper-outer quadrant of left breast in female, estrogen receptor positive ([place]) 07/10/2016  . Allergic rhinitis 06/13/2016  . History of basal cell carcinoma 06/13/2016  . Eczema 06/13/2016  . Hyperlipemia 06/13/2016  . History of colon polyps 06/13/2016  . 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 RLS (restless legs syndrome) 06/13/2016  . Hypothyroidism 07/20/2013  . Bunion 01/21/2013  . Osteopenia 01/20/2013    Annia Friendly, PT 07/11/16 1:29 PM  [place], [name], [address] Phone: [phone]   Fax:  [phone]  Name: Jamie [name] MRN: [id number] Date of Birth: December 17, 1955

## 2016-07-11 NOTE — Progress Notes (Signed)
Clinical Social Work [place] Psychosocial Distress Screening [place]  Patient completed distress screening protocol and scored a 5 on the Psychosocial Distress Thermometer which indicates moderate distress. Clinical Social Worker met with patient and patients husband in [facility] to assess for distress and other psychosocial needs. Patient stated she was feeling overwhelmed and was anxious about having chemo, but felt positive after meeting with the treatment team and getting more information on her treatment plan. CSW and patient discussed common feeling and emotions when being diagnosed with cancer, and the importance of support during treatment. CSW informed patient of the support team and support services at [facility], and patient was agreeable to an [employer] referral. CSW provided contact information and encouraged patient to call with any questions or concerns.   ONCBCN DISTRESS SCREENING 07/11/2016  Screening Type Initial Screening  Distress experienced in past week (1-10) 5  Information Concerns Type Lack of info about treatment  Referral to clinical social work Yes  Referral to support programs Yes    Johnnye Lana, MSW, LCSW, OSW-C Clinical Social Worker [place] [phone]

## 2016-07-11 NOTE — Progress Notes (Signed)
START OFF PATHWAY REGIMEN - Breast  Off Pathway: Nab-Paclitaxel (Abraxane(R)) 100 mg/m2 D1,8,15 q28 days followed by Trastuzumab (Maintenance - w/Loading Dose) x 11 Cycles  [id number]:Nab-Paclitaxel (Abraxane(R)) 100 mg/m2 D1,8,15 q28 days:   A cycle is every 28 days (3 weeks on and 1 week off):     Nab-paclitaxel (protein bound) (Abraxane(R)) 100 mg/m2 in solution volume per manufacturer guidelines via IV over 30 minutes days 1, 8, 15 (3 weeks on followed by one week off)       Dose Mod: None  **Always confirm dose/schedule in your pharmacy ordering system**    [id number]:Trastuzumab (Maintenance - w/Loading Dose) x 11 Cycles:   A cycle is every 21 days:     Trastuzumab (Herceptin(R)) 8 mg/kg in 250 mL NS IV over 90 minutes as a loading dose first dose only. LVEF assessment recommended at baseline.       Dose Mod: None     Trastuzumab (Herceptin(R)) 6 mg/kg in 250 mL NS IV over 90 minutes as a maintenance dose for cycles 2-11. Recommended Monitoring: LVEF q3-4 months for adjuvant treatment, or with the development of cardiac symptoms and regimen changes for metastatic  treatment.       Dose Mod: None  **Always confirm dose/schedule in your pharmacy ordering system**    Patient Characteristics: Adjuvant Therapy, Node Negative, HER2/neu Positive, ER Positive, Stage Ia and Ib, T1b, Chemotherapy Indicated AJCC Stage Grouping: IA Current Disease Status: No Distant Mets or Local Recurrence AJCC M Stage: 0 ER Status: Positive (+) AJCC N Stage: 0 AJCC T Stage: 1b HER2/neu: Positive (+) PR Status: Negative (-) Node Status: Negative (-) Intervention Indicated: Chemotherapy  Intent of Therapy: Curative Intent, Discussed with Patient

## 2016-07-11 NOTE — Progress Notes (Signed)
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 This showed a possible mass in the left breast. On 07/02/2016 she underwent left diagnostic mammography with tomography and left breast ultrasonography. The breast density was category C. In the left breast upper outer quadrant there was a microlobulated mass which was not directly palpable, although there was slight thickening in the left breast 1:00 position. Targeted ultrasonography confirmed a solid hypoechoic microlobulated mass in the left breast 1:00 radiant 4 cm from the nipple, measuring 0.8 cm.  On 07/03/2016 Jamie [name] underwent biopsy of the left breast mass in question, and this showed ([id number]) invasive ductal carcinoma, grade 2 or 3, with extracellular mucin, estrogen receptor 5% positive with weak staining intensity, progesterone receptor negative, with an MIB-1 of 50%, and HER-2 amplified, the signals ratio being 5.71 and the number per cell 14.55.  Her subsequent history is as detailed below  INTERVAL HISTORY: Jamie [name] was evaluated in the multidisciplinary breast cancer clinic 07/11/2016, accompanied by her husband Jamie [name]. Her case was also presented in the multidisciplinary breast cancer conference that same morning. At that time a preliminary plan was proposed:Genetics testing, breast MRI given the breast density,  but likely breast conserving surgery to follow, chemotherapy and anti-HER-2 immunotherapy, radiation, and finally anti-estrogens.  REVIEW OF SYSTEMS: There were no specific symptoms leading to the original mammogram, which was routinely scheduled. The patient denies unusual headaches, visual changes, nausea, vomiting, stiff neck, dizziness, or gait imbalance. There has been no cough, phlegm production, or pleurisy, no chest pain or pressure, and no change in bowel or bladder habits. The patient denies fever, rash, bleeding, unexplained fatigue or unexplained weight loss. Jamie [name] exercises regularly, going to the gym 3-4 times a week. She admits to problems with insomnia, mild sinus symptoms, chronic low back pain which is not disabling, rare headaches, and some anxiety but no depression. She has occasional hot flashes. A detailed review of systems was otherwise entirely negative.   PAST MEDICAL HISTORY: Past Medical History:  Diagnosis Date  . Anxiety   . Basal cell carcinoma   . Eczema     PAST SURGICAL HISTORY: Past Surgical History:  Procedure Laterality Date  . BONE SPUR  2001 AND 1988  . BUNIONECTOMY  10/13  . COLONOSCOPY W/ POLYPECTOMY  08/2007  . DILATION AND CURETTAGE OF UTERUS  2002  . MOHS SURGERY  2010    FAMILY HISTORY Family History  Problem Relation Age of Onset  . Cancer Mother     ovarian  . Hypertension Father   . Stroke Father   . Heart disease Father   . Cancer Maternal Aunt     breast  . Heart disease Paternal Grandmother   . Osteoporosis Sister   The patient's father died at age 63, with some form of skin cancer, most likely melanoma. The patient's mother died at the age of 26 with ovarian cancer, which had been diagnosed a few months prior. The patient had no brothers, 1 sister. A  maternal aunt was diagnosed with breast cancer at age 57, recurrent age 20.  GYNECOLOGIC HISTORY:  No LMP recorded. Patient is postmenopausal. Menarche age 29, the patient is GX  P0. She stopped having periods approximately age 64. 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 Her husband Jamie [name] worked as a Dance movement psychotherapist for a [employer]. Their last name is pronounced foh-TEE-ah and they tell me it means "burning" in [place]. Their children are adopted. Jamie [name] lives in [place] and works in Press photographer, and Jamie [name] lives in [place] and is an Scientist, water quality. The patient has 3 grandchildren. She is not a Ambulance person.    ADVANCED DIRECTIVES: In place   HEALTH MAINTENANCE: Social History  Substance Use Topics  . Smoking status: Never Smoker  . Smokeless tobacco: Never Used  . Alcohol use 0.0 oz/week     Comment: 1-2     Colonoscopy:2009  WSF:KCLEXN 2018  Bone density:April 2016   Allergies  Allergen Reactions  . Codeine Other (See Comments)    unknown  . Sulfamethoxazole Other (See Comments)    unknown    Current Outpatient Prescriptions  Medication Sig Dispense Refill  . budesonide-formoterol (SYMBICORT) 160-4.5 MCG/ACT inhaler Inhale 2 puffs into the lungs 2 (two) times daily. 1 Inhaler 1  . Cholecalciferol (VITAMIN D3) 10000 units TABS Take by mouth. TAKE 1 TABLET TWICE A DAY    . Cyanocobalamin (B-12) 2500 MCG TABS Take by mouth.    . Ginkgo Biloba 40 MG TABS Take 120 mg by mouth daily.    . promethazine (PHENERGAN) 25 MG tablet Take by mouth.    . Red Yeast Rice Extract 600 MG CAPS Take 600 mg by mouth.    Jamie [name] Kitchen UNABLE TO FIND 400 mg.     No current facility-administered medications for this visit.     OBJECTIVE: Middle-aged white woman who appears well  Vitals:   07/11/16 0832  BP: 108/64  Pulse: 69  Resp: 20  Temp: 98.2 F (36.8 C)     Body mass index is 24.13 kg/m.    ECOG FS:0 - Asymptomatic  Ocular: Sclerae unicteric, pupils equal, round and reactive to light Ear-nose-throat: Oropharynx clear and moist Lymphatic: No cervical or supraclavicular  adenopathy Lungs no rales or rhonchi, good excursion bilaterally Heart regular rate and rhythm, no murmur appreciated Abd soft, nontender, positive bowel sounds MSK no focal spinal tenderness, no joint edema Neuro: non-focal, well-oriented, appropriate affect Breasts: The right breast is unremarkable. I do not palpate a mass in the left breast. There is no skin or nipple changes of concern. The axillae are benign.   LAB RESULTS:  CMP     Component Value Date/Time   NA 141 07/11/2016 0821   K 3.8 07/11/2016 0821   CL 100 08/04/2015 0831   CO2 29 07/11/2016 0821   GLUCOSE 69 (L) 07/11/2016 0821   BUN 9.2 07/11/2016 0821   CREATININE 0.8 07/11/2016 0821   CALCIUM 9.7 07/11/2016 0821   PROT 7.1 07/11/2016 0821   ALBUMIN 4.0 07/11/2016 0821   AST 18 07/11/2016 0821   ALT 17 07/11/2016 0821   ALKPHOS 59 07/11/2016 0821   BILITOT 0.41 07/11/2016 0821   GFRNONAA 86 08/04/2015 0831   GFRAA >89 08/04/2015 0831    INo results found for: SPEP, UPEP  Lab Results  Component Value Date   WBC 6.1 07/11/2016   NEUTROABS 3.2 07/11/2016   HGB 13.6  07/11/2016   HCT 39.3 07/11/2016   MCV 91.0 07/11/2016   PLT 347 07/11/2016      Chemistry      Component Value Date/Time   NA 141 07/11/2016 0821   K 3.8 07/11/2016 0821   CL 100 08/04/2015 0831   CO2 29 07/11/2016 0821   BUN 9.2 07/11/2016 0821   CREATININE 0.8 07/11/2016 0821   GLU 73 08/02/2014      Component Value Date/Time   CALCIUM 9.7 07/11/2016 0821   ALKPHOS 59 07/11/2016 0821   AST 18 07/11/2016 0821   ALT 17 07/11/2016 0821   BILITOT 0.41 07/11/2016 0821       No results found for: LABCA2  No components found for: LABCA125  No results for input(s): INR in the last 168 hours.  Urinalysis No results found for: COLORURINE, APPEARANCEUR, LABSPEC, PHURINE, GLUCOSEU, HGBUR, BILIRUBINUR, KETONESUR, PROTEINUR, UROBILINOGEN, NITRITE, LEUKOCYTESUR   STUDIES: Dg Chest 2 View  Result Date: 06/13/2016 CLINICAL DATA:   Cough for 3 weeks EXAM: CHEST  2 VIEW COMPARISON:  None. FINDINGS: There is linear scarring or atelectasis in the lingula. No definite pneumonia or effusion is seen. Mediastinal and hilar contours are unremarkable. The heart is within normal limits in size. No bony abnormality is seen. IMPRESSION: Linear scarring or atelectasis in the lingula. No definite active process. Electronically Signed   By: Ivar Drape M.D.   On: 06/13/2016 10:29   US Breast Ltd Uni Left Inc Axilla  Result Date: 07/02/2016 CLINICAL DATA:  Left breast upper outer quadrant focal asymmetry seen on most recent screening mammography. EXAM: 2D DIGITAL DIAGNOSTIC LEFT MAMMOGRAM WITH CAD AND ADJUNCT TOMO ULTRASOUND LEFT BREAST COMPARISON:  Previous exam(s). ACR Breast Density Category c: The breast tissue is heterogeneously dense, which may obscure small masses. FINDINGS: Mammographically, there is a partially obscured macrolobulated isodense to breast parenchyma mass in the left breast upper outer quadrant, posterior depth. Mammographic images were processed with CAD. On physical exam, no suspicious masses are palpated. There is a slight thickening in the left 12-1 o'clock breast, middle to posterior depth. Targeted ultrasound is performed, showing solid hypoechoic macrolobulated taller than wide mass in the left breast 1 o'clock 4 cm from the nipple which measures 0.7 x 0.8 x 0.8 cm. There is no evidence of left axillary lymphadenopathy. IMPRESSION: Solid left breast 1 o'clock mass, for which ultrasound-guided core needle biopsy is recommended. RECOMMENDATION: Ultrasound-guided core needle biopsy of the left breast. I have discussed the findings and recommendations with the patient. Results were also provided in writing at the conclusion of the visit. If applicable, a reminder letter will be sent to the patient regarding the next appointment. BI-RADS CATEGORY  4: Suspicious. Electronically Signed   By: Fidela Salisbury M.D.   On:  07/02/2016 14:48   Mm Diag Breast Tomo Uni Left  Result Date: 07/02/2016 CLINICAL DATA:  Left breast upper outer quadrant focal asymmetry seen on most recent screening mammography. EXAM: 2D DIGITAL DIAGNOSTIC LEFT MAMMOGRAM WITH CAD AND ADJUNCT TOMO ULTRASOUND LEFT BREAST COMPARISON:  Previous exam(s). ACR Breast Density Category c: The breast tissue is heterogeneously dense, which may obscure small masses. FINDINGS: Mammographically, there is a partially obscured macrolobulated isodense to breast parenchyma mass in the left breast upper outer quadrant, posterior depth. Mammographic images were processed with CAD. On physical exam, no suspicious masses are palpated. There is a slight thickening in the left 12-1 o'clock breast, middle to posterior depth. Targeted ultrasound is performed, showing solid hypoechoic macrolobulated taller  than wide mass in the left breast 1 o'clock 4 cm from the nipple which measures 0.7 x 0.8 x 0.8 cm. There is no evidence of left axillary lymphadenopathy. IMPRESSION: Solid left breast 1 o'clock mass, for which ultrasound-guided core needle biopsy is recommended. RECOMMENDATION: Ultrasound-guided core needle biopsy of the left breast. I have discussed the findings and recommendations with the patient. Results were also provided in writing at the conclusion of the visit. If applicable, a reminder letter will be sent to the patient regarding the next appointment. BI-RADS CATEGORY  4: Suspicious. Electronically Signed   By: Fidela Salisbury M.D.   On: 07/02/2016 14:48   Mm Screening Breast Tomo Bilateral  Result Date: 06/22/2016 CLINICAL DATA:  Screening. EXAM: 2D DIGITAL SCREENING BILATERAL MAMMOGRAM WITH CAD AND ADJUNCT TOMO COMPARISON:  Previous exam(s). ACR Breast Density Category c: The breast tissue is heterogeneously dense, which may obscure small masses. FINDINGS: In the left breast, a possible mass warrants further evaluation. In the right breast, no findings suspicious  for malignancy. Images were processed with CAD. IMPRESSION: Further evaluation is suggested for possible mass in the left breast. RECOMMENDATION: Diagnostic mammogram and possibly ultrasound of the left breast. (Code:[id number]) The patient will be contacted regarding the findings, and additional imaging will be scheduled. BI-RADS CATEGORY  0: Incomplete. Need additional imaging evaluation and/or prior mammograms for comparison. Electronically Signed   By: Marin Olp M.D.   On: 06/22/2016 15:05   Mm Clip Placement Left  Result Date: 07/03/2016 CLINICAL DATA:  Post biopsy mammogram of the left breast for clip placement. EXAM: DIAGNOSTIC LEFT MAMMOGRAM POST ULTRASOUND BIOPSY COMPARISON:  Previous exam(s). FINDINGS: Mammographic images were obtained following ultrasound guided biopsy of left breast mass at 1 o'clock. The ribbon shaped biopsy marking clip is appropriately positioned within the biopsied mass at 1 o'clock. IMPRESSION: Appropriate positioning of the ribbon shaped biopsy marking clip within the mass at 1 o'clock. Final Assessment: Post Procedure Mammograms for Marker Placement Electronically Signed   By: Ammie Ferrier M.D.   On: 07/03/2016 16:48   [place] Lt Breast Bx W Loc Dev 1st Lesion Img Bx Spec US Guide  Addendum Date: 07/05/2016   ADDENDUM REPORT: 07/04/2016 10:15 ADDENDUM: Pathology revealed GRADE II-III INVASIVE DUCTAL CARCINOMA WITH EXTRACELLULAR MUCIN, DUCTAL CARCINOMA IN SITU WITH CALCIFICATIONS of the Left breast, 1:00 o'clock. This was found to be concordant by Dr. Ammie Ferrier. Pathology results were discussed with the patient by telephone. The patient reported doing well after the biopsy with tenderness and minimal bleeding at the site. Post biopsy instructions and care were reviewed and questions were answered. The patient was encouraged to call [facility] for any additional concerns. The patient was referred to [facility] on July 11, 2016. Pathology results reported by Terie Purser, RN on 07/04/2016. Electronically Signed   By: Ammie Ferrier M.D.   On: 07/04/2016 10:15   Result Date: 07/05/2016 CLINICAL DATA:  61 year old female presenting for ultrasound-guided biopsy of a left breast mass. EXAM: ULTRASOUND GUIDED LEFT BREAST CORE NEEDLE BIOPSY COMPARISON:  Previous exam(s). FINDINGS: I met with the patient and we discussed the procedure of ultrasound-guided biopsy, including benefits and alternatives. We discussed the high likelihood of a successful procedure. We discussed the risks of the procedure, including infection, bleeding, tissue injury, clip migration, and inadequate sampling. Informed written consent was given. The usual time-out protocol was performed immediately prior to the procedure. Using sterile  technique and 1% Lidocaine as local anesthetic, under direct ultrasound visualization, a 14 gauge spring-loaded device was used to perform biopsy of mass in the left breast at 1 o'clock using a lateral approach. At the conclusion of the procedure a ribbon shaped tissue marker clip was deployed into the biopsy cavity. Follow up 2 view mammogram was performed and dictated separately. IMPRESSION: Ultrasound guided biopsy of left breast mass at 1 o'clock. No apparent complications. Electronically Signed: By: Ammie Ferrier M.D. On: 07/03/2016 16:41    ELIGIBLE FOR AVAILABLE RESEARCH PROTOCOL: no  ASSESSMENT: 61 y.o. Jamie [name], Jamie [name] woman status post biopsy of the left breast upper outer quadrant lesion 07/03/2016 showing a clinical T1b N 0, stage 1B invasive ductal carcinoma, grade 2 or 3, estrogen receptor weakly positive at 5%, progesterone receptor negative, but HER-2 strongly amplified, with an MIB-1 of 50%.  (1) breast conserving surgery with sentinel lymph node sampling pending  (2) chemotherapy will consist of Abraxane weekly 12 With  anti-HER-2 immunotherapy   (3) trastuzumab to start with chemotherapy, continue for one year  (a) baseline echocardiogram pending  (4) adjuvant radiation to follow chemotherapy  (5) anti-estrogens to follow at the completion of radiation  (6) genetics testing pending   PLAN: We spent the better part of today's hour-long appointment discussing the biology of breast cancer in general, and the specifics of the patient's tumor in particular.We first reviewed the fact that cancer is not one disease but more than 100 different diseases and that it is important to keep them separate-- otherwise when friends and relatives discuss their own cancer experiences with Jamie [name] confusion can result. Similarly we discussed that if breast cancer spreads to the bones, lungs or liver, Jamie [name]  would not have bone cancer or liver cancer, but breast cancer in the bone and breast cancer in the liver-- one cancer in three places-- not three different cancers, which otherwise would have to be treated in 3 different ways.  We emphasized the difference between local and systemic therapies. In terms of loco/regional treatment, lumpectomy plus radiation is equivalent to mastectomy as far as survival is concerned. We also noted that in terms of sequencing of treatments, whether systemic therapy or surgery is done first does not affect the ultimate outcome.  We then reviewed systemic therapy options. These include anti-estrogens, anti-HER-2 treatment, and chemotherapy. In terms of anti-estrogens, while there is minimal estrogen receptor positivity, she will benefit to some small extent from anti-estrogen pills. This will be started at the completion of her local treatment. Clearly however anti-estrogens cannot be her primary form of systemic therapy  Instead, she will need anti-HER-2 immunotherapy and the standard of care in this setting requires chemotherapy as well. In larger tumors we generally use a combination of carboplatin  and Taxotere. In smaller tumors we can use attack same, with Abraxane being the best tolerated. This will be given on a weekly basis 12 and we discussed the possible toxicities, side effects and complications of this agent in detail today.  She will need Herceptin for 1 year. She understands that this can weaken the heart muscle as well as cause immune reactions and other possible side effects. Jamie [name] is being set up for an echocardiogram and a visit with cardio oncology as well as our "chemotherapy school". She will have a port placed within the next week or so.  Given the family history of ovarian cancer Jamie [name] genetics testing. We discussed the fact that if she does carry a deleterious mutation bilateral  mastectomies or intensified screening with yearly MRI in addition to yearly mammography are equally safe. She tells me that she would clearly prefer the intensified screening to bilateral mastectomies. Accordingly there is no reason to postpone her initial surgery pending genetics results.  The family has a trip to [place] with her granddaughter planned between April 18 and April 30. Accordingly we will start her anti-HER-2 immunotherapy on 08/08/2016, but we will hold the chemotherapy until she returns from that trip so there will not be a significant break in the treatment.  Jamie [name]  has a good understanding of the overall plan. She agrees with it. She knows the goal of treatment in her case is cure. She will call with any problems that may develop before her next visit here.  Jamie Cruel, MD   07/11/2016 10:07 AM Medical Oncology and Hematology [facility] [address] [place], [place] [address] Tel. [phone]    Fax. [phone]

## 2016-07-11 NOTE — Progress Notes (Signed)
Nutrition Assessment  Reason for Assessment:  Pt seen in Breast Clinic  ASSESSMENT:   61 year old female with new diagnosis left breast cancer.  Past medical history of HLD, basal cell carcinoma  Patient tearful during visit.  Medications:  Reviewed and encouraged patient to stop all herbal supplements not recommended by MD during treatment.   Labs: reviewed  Anthropometrics:   Height: 66 inches Weight: 149 pound BMI: 24.2   NUTRITION DIAGNOSIS: Food and nutrition related knowledge deficit related to new diagnosis of breast cancer as evidenced by no prior need for nutrition related information.  INTERVENTION:  Discussed and provided packet of information regarding nutritional tips for breast cancer patients.  Questions answered.  Teachback method used.      MONITORING, EVALUATION, and GOAL: Pt will consume a healthy plant based diet to maintain lean body mass throughout treatment.   Sherion Dooly B. Zenia Resides, [place], [place] (pager)

## 2016-07-11 NOTE — Progress Notes (Signed)
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 female who had a screening mammogram on 06/22/16 showing a possible mass in the left breast. Diagnostic mammogram of the left breast on 07/02/16 showed a partially obscured mass in the upper outer quadrant posterior depth. Physical exam at the time revealed no palpable suspicious masses, but there was slight thickening in the 12-1:00 position middle to posterior depth. Ultrasound at the time showed a 0.7 x 0.8 x 0.8 cm mass in the 1:00 position 4 cm from the nipple. Ultrasound of the left axilla revealed no lymphadenopathy. 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 The patient presents today in multidisciplinary breast clinic to discuss treatment options for the management of her disease.  Gynecologic History  Age at first menstrual period? 11  Are you still having periods? No  If you no longer have periods: Have you used hormone replacement? No Obstetric History:  How many children have you carried to term? 0 (adopted)  Pregnant now or trying to get pregnant? No  Have you used birth control pills or hormone shots for contraception? Yes, BC  If so, for how long (or approximate dates)? [phone] and ~2002  Would you be interested in learning more about the options to preserve fertility? No Health Maintenance:  Have you ever had a colonoscopy? Yes If yes, date? 2009/2010  Have you ever had  a bone density? Yes If yes, date? 2014 and 09/01/14  Date of your last PAP smear? 07/10/2016 Date of your FIRST mammogram? ~25 years ago  PREVIOUS RADIATION THERAPY: No  PAST MEDICAL HISTORY:  has a past medical history of Anxiety; Basal cell carcinoma; and Eczema.    PAST SURGICAL HISTORY: Past Surgical History:  Procedure Laterality Date  . BONE SPUR  2001 AND 1988  . BUNIONECTOMY  10/13  . COLONOSCOPY W/ POLYPECTOMY  08/2007  . DILATION AND CURETTAGE OF UTERUS  2002  . MOHS SURGERY  2010    FAMILY HISTORY: family history includes Cancer in her maternal aunt and mother; Heart disease in her father and paternal grandmother; Hypertension in her father; Osteoporosis in her sister; Stroke in her father.  SOCIAL HISTORY:  reports that she has never smoked. She has never used smokeless tobacco. She reports that she drinks alcohol. She reports that she does not use drugs.  ALLERGIES: Adhesive [tape]; Codeine; and Sulfamethoxazole  MEDICATIONS:  Current Outpatient Prescriptions  Medication Sig Dispense Refill  . Biotin 1000 MCG CHEW Chew 1 tablet by mouth daily.    . budesonide-formoterol (SYMBICORT) 160-4.5 MCG/ACT inhaler Inhale 2 puffs into the lungs 2 (two) times daily. 1 Inhaler 1  . CALCIUM PO Take by mouth 2 (two) times daily. Skeletal strength 343m Calium    . cetirizine (ZYRTEC) 10 MG tablet Take 10 mg by mouth daily.    . Cholecalciferol (VITAMIN D3) 1000 units CAPS Take 1 capsule by mouth daily.    . Cyanocobalamin (B-12) 2500 MCG TABS Take by mouth.    .[name]  Lactobacillus (ACIDOPHILUS PROBIOTIC PO) Take by mouth daily.    . LUTEIN PO Take 20 mcg by mouth daily.    . Omega-3 Fatty Acids (FISH OIL OMEGA-3 PO) Take by mouth daily.    [name] UNABLE TO FIND 2 (two) times daily. Protandim NRF2 Synergizer     No current facility-administered medications for this encounter.     REVIEW OF SYSTEMS:  A 15 point review of systems is documented in the electronic medical record. This was  obtained by the nursing staff. However, I reviewed this with the patient to discuss relevant findings and make appropriate changes.  Pertinent items noted in HPI and remainder of comprehensive ROS otherwise negative.   The patient complains of loss of sleep, wears contacts, a sore throat, sinus problems, SOB walking up stairs, a productive cough, left breast lumps, skin rash, a history of skin cancer, back pain, headaches, anxiety, and hot flashes.   PHYSICAL EXAM:  Vitals with BMI 07/11/2016  Height 5' 6"   Weight 149 lbs 8 oz  BMI 35.5  Systolic 732  Diastolic 64  Pulse 69  Respirations 20  General: Alert and oriented, teary eyed. HEENT: Head is normocephalic. Extraocular movements are intact. Oropharynx is clear. Neck: Neck is supple, no palpable cervical or supraclavicular lymphadenopathy. Heart: Regular in rate and rhythm with no murmurs, rubs, or gallops. Chest: Clear to auscultation bilaterally, with no rhonchi, wheezes, or rales. Abdomen: Soft, nontender, nondistended, with no rigidity or guarding. Extremities: No cyanosis or edema. Lymphatics: see Neck Exam Skin: No concerning lesions. Musculoskeletal: symmetric strength and muscle tone throughout. Neurologic: Cranial nerves II through XII are grossly intact. No obvious focalities. Speech is fluent. Coordination is intact. Psychiatric: Judgment and insight are intact. Affect is appropriate. Breast: Right breast no palpable mass or nipple discharge. Left breast no palpable mass or nipple discharge, some bruising in the UOQ of the left breast.  ECOG = 1  LABORATORY DATA:  Lab Results  Component Value Date   WBC 6.1 07/11/2016   HGB 13.6 07/11/2016   HCT 39.3 07/11/2016   MCV 91.0 07/11/2016   PLT 347 07/11/2016   NEUTROABS 3.2 07/11/2016   Lab Results  Component Value Date   NA 141 07/11/2016   K 3.8 07/11/2016   CL 100 08/04/2015   CO2 29 07/11/2016   GLUCOSE 69 (L) 07/11/2016   CREATININE 0.8 07/11/2016   CALCIUM  9.7 07/11/2016      RADIOGRAPHY: Dg Chest 2 View  Result Date: 06/13/2016 CLINICAL DATA:  Cough for 3 weeks EXAM: CHEST  2 VIEW COMPARISON:  None. FINDINGS: There is linear scarring or atelectasis in the lingula. No definite pneumonia or effusion is seen. Mediastinal and hilar contours are unremarkable. The heart is within normal limits in size. No bony abnormality is seen. IMPRESSION: Linear scarring or atelectasis in the lingula. No definite active process. Electronically Signed   By: Ivar Drape M.D.   On: 06/13/2016 10:29   US Breast Ltd Uni Left Inc Axilla  Result Date: 07/02/2016 CLINICAL DATA:  Left breast upper outer quadrant focal asymmetry seen on most recent screening mammography. EXAM: 2D DIGITAL DIAGNOSTIC LEFT MAMMOGRAM WITH CAD AND ADJUNCT TOMO ULTRASOUND LEFT BREAST COMPARISON:  Previous exam(s). ACR Breast Density Category c: The breast tissue is heterogeneously dense, which may obscure small masses. FINDINGS: Mammographically, there is a partially obscured macrolobulated isodense to breast parenchyma mass in the left breast upper outer quadrant, posterior depth. Mammographic images were processed with CAD. On physical exam, no suspicious  masses are palpated. There is a slight thickening in the left 12-1 o'clock breast, middle to posterior depth. Targeted ultrasound is performed, showing solid hypoechoic macrolobulated taller than wide mass in the left breast 1 o'clock 4 cm from the nipple which measures 0.7 x 0.8 x 0.8 cm. There is no evidence of left axillary lymphadenopathy. IMPRESSION: Solid left breast 1 o'clock mass, for which ultrasound-guided core needle biopsy is recommended. RECOMMENDATION: Ultrasound-guided core needle biopsy of the left breast. I have discussed the findings and recommendations with the patient. Results were also provided in writing at the conclusion of the visit. If applicable, a reminder letter will be sent to the patient regarding the next appointment.  BI-RADS CATEGORY  4: Suspicious. Electronically Signed   By: Fidela Salisbury M.D.   On: 07/02/2016 14:48   Mm Diag Breast Tomo Uni Left  Result Date: 07/02/2016 CLINICAL DATA:  Left breast upper outer quadrant focal asymmetry seen on most recent screening mammography. EXAM: 2D DIGITAL DIAGNOSTIC LEFT MAMMOGRAM WITH CAD AND ADJUNCT TOMO ULTRASOUND LEFT BREAST COMPARISON:  Previous exam(s). ACR Breast Density Category c: The breast tissue is heterogeneously dense, which may obscure small masses. FINDINGS: Mammographically, there is a partially obscured macrolobulated isodense to breast parenchyma mass in the left breast upper outer quadrant, posterior depth. Mammographic images were processed with CAD. On physical exam, no suspicious masses are palpated. There is a slight thickening in the left 12-1 o'clock breast, middle to posterior depth. Targeted ultrasound is performed, showing solid hypoechoic macrolobulated taller than wide mass in the left breast 1 o'clock 4 cm from the nipple which measures 0.7 x 0.8 x 0.8 cm. There is no evidence of left axillary lymphadenopathy. IMPRESSION: Solid left breast 1 o'clock mass, for which ultrasound-guided core needle biopsy is recommended. RECOMMENDATION: Ultrasound-guided core needle biopsy of the left breast. I have discussed the findings and recommendations with the patient. Results were also provided in writing at the conclusion of the visit. If applicable, a reminder letter will be sent to the patient regarding the next appointment. BI-RADS CATEGORY  4: Suspicious. Electronically Signed   By: Fidela Salisbury M.D.   On: 07/02/2016 14:48   Mm Screening Breast Tomo Bilateral  Result Date: 06/22/2016 CLINICAL DATA:  Screening. EXAM: 2D DIGITAL SCREENING BILATERAL MAMMOGRAM WITH CAD AND ADJUNCT TOMO COMPARISON:  Previous exam(s). ACR Breast Density Category c: The breast tissue is heterogeneously dense, which may obscure small masses. FINDINGS: In the left  breast, a possible mass warrants further evaluation. In the right breast, no findings suspicious for malignancy. Images were processed with CAD. IMPRESSION: Further evaluation is suggested for possible mass in the left breast. RECOMMENDATION: Diagnostic mammogram and possibly ultrasound of the left breast. (Code:[id number]-[id number]) The patient will be contacted regarding the findings, and additional imaging will be scheduled. BI-RADS CATEGORY  0: Incomplete. Need additional imaging evaluation and/or prior mammograms for comparison. Electronically Signed   By: Marin Olp M.D.   On: 06/22/2016 15:05   Mm Clip Placement Left  Result Date: 07/03/2016 CLINICAL DATA:  Post biopsy mammogram of the left breast for clip placement. EXAM: DIAGNOSTIC LEFT MAMMOGRAM POST ULTRASOUND BIOPSY COMPARISON:  Previous exam(s). FINDINGS: Mammographic images were obtained following ultrasound guided biopsy of left breast mass at 1 o'clock. The ribbon shaped biopsy marking clip is appropriately positioned within the biopsied mass at 1 o'clock. IMPRESSION: Appropriate positioning of the ribbon shaped biopsy marking clip within the mass at 1 o'clock. Final Assessment: Post Procedure Mammograms for Marker Placement Electronically Signed  By: Ammie Ferrier M.D.   On: 07/03/2016 16:48   [place] Lt Breast Bx W Loc Dev 1st Lesion Img Bx Spec US Guide  Addendum Date: 07/05/2016   ADDENDUM REPORT: 07/04/2016 10:15 ADDENDUM: Pathology revealed GRADE II-III INVASIVE DUCTAL CARCINOMA WITH EXTRACELLULAR MUCIN, DUCTAL CARCINOMA IN SITU WITH CALCIFICATIONS of the Left breast, 1:00 o'clock. This was found to be concordant by Dr. Ammie Ferrier. Pathology results were discussed with the patient by telephone. The patient reported doing well after the biopsy with tenderness and minimal bleeding at the site. Post biopsy instructions and care were reviewed and questions were answered. The patient was encouraged to call [facility] for any additional concerns. The patient was referred to [facility] on July 11, 2016. Pathology results reported by Terie Purser, RN on 07/04/2016. Electronically Signed   By: Ammie Ferrier M.D.   On: 07/04/2016 10:15   Result Date: 07/05/2016 CLINICAL DATA:  61 year old female presenting for ultrasound-guided biopsy of a left breast mass. EXAM: ULTRASOUND GUIDED LEFT BREAST CORE NEEDLE BIOPSY COMPARISON:  Previous exam(s). FINDINGS: I met with the patient and we discussed the procedure of ultrasound-guided biopsy, including benefits and alternatives. We discussed the high likelihood of a successful procedure. We discussed the risks of the procedure, including infection, bleeding, tissue injury, clip migration, and inadequate sampling. Informed written consent was given. The usual time-out protocol was performed immediately prior to the procedure. Using sterile technique and 1% Lidocaine as local anesthetic, under direct ultrasound visualization, a 14 gauge spring-loaded device was used to perform biopsy of mass in the left breast at 1 o'clock using a lateral approach. At the conclusion of the procedure a ribbon shaped tissue marker clip was deployed into the biopsy cavity. Follow up 2 view mammogram was performed and dictated separately. IMPRESSION: Ultrasound guided biopsy of left breast mass at 1 o'clock. No apparent complications. Electronically Signed: By: Ammie Ferrier M.D. 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 The patient's mother did have a history of ovarian cancer and therefore the patient would qualify for genetic testing. The patient discussed she would not have additional  surgeries (mastectomy), even if  genetics came back positive for mutations. Therefore, radiation will be directed to the left breast post operatively to reduce the risk of recurrence.  I spoke to the patient today regarding her diagnosis and options for treatment. We discussed the equivalence in terms of survival and local failure between mastectomy and breast conservation. We discussed the role of radiation in decreasing local failures in patients who undergo lumpectomy. We discussed the process of CT simulation and the placement tattoos. We discussed 4-6 weeks of treatment as an outpatient. We discussed the possibility of asymptomatic lung damage. We discussed the low likelihood of secondary malignancies. We discussed the possible side effects including but not limited to skin redness, fatigue, permanent skin darkening, and breast swelling.  We discussed the use of cardiac sparing with deep inspiration breath hold if needed.  PLAN: The patient has been scheduled for genetic counseling on 07/16/16 given her family history (Mother: Ovarian and Maternal Aunt: Recurrent breast cancer). The patient would like to proceed with a left lumpectomy and sentinel lymph node biopsy without  waiting on the genetic results. The patient will be placed with a port at that time. The patient will then undergo chemotherapy under the care of Dr. [name] given the patient's HER2 positive disease. After the completion of chemotherapy, the patient will return to see me to further discuss radiation therapy.  Patient will also proceed with an MRI for further evaluation.     ------------------------------------------------  Blair Promise, PhD, MD  This document serves as a record of services personally performed by [name], MD. It was created on his behalf by Darcus Austin, a trained medical scribe. The creation of this record is based on the scribe's personal observations and the provider's statements to them. This document  has been checked and approved by the attending provider.

## 2016-07-12 ENCOUNTER — Ambulatory Visit
Admission: RE | Admit: 2016-07-12 | Discharge: 2016-07-12 | Disposition: A | Discharge status: Home / Self Care | Payer: 59 | Source: Ambulatory Visit | Attending: General Surgery | Admitting: General Surgery

## 2016-07-12 ENCOUNTER — Encounter: Payer: Self-pay | Admitting: Oncology

## 2016-07-12 DIAGNOSIS — C50412 Malignant neoplasm of upper-outer quadrant of left female breast: Secondary | ICD-10-CM

## 2016-07-12 DIAGNOSIS — Z170 Estrogen receptor positive status [ER+]: Principal | ICD-10-CM

## 2016-07-12 MED ORDER — GADOBENATE DIMEGLUMINE 529 MG/ML IV SOLN
15.0000 mL | Freq: Once | INTRAVENOUS | Status: AC | PRN
  Administered 2016-07-12: 15 mL via INTRAVENOUS

## 2016-07-12 NOTE — Progress Notes (Signed)
Called patient referred by Varney Biles to introduce myself as her Estate manager/land agent and to discuss financial questions or concerns.Patient asked about the code for the genetic testing. I checked with [name] who does the pre-certs for this kind of testing and she states it depend s on the specific test that is ordered. Advised patient I wasn't familiar with the testing but she states someone would call her back with the code anyway.  Patient was able to review her benefits online while we were on the phone and become familiar with her plan and the remaining amounts before her OOP is met. Her deductible has already been satisfied.Patient has a 90/10 plan.   Advised patient there is a co-pay program for Herceptin if she does not meet her OOP by the time she begins. Also advised patient there may be other assistance available for treatment that she may apply for and we can apply as needed. Gave patient my contact information for any additional questions or concerns. Patient very appreciative.

## 2016-07-14 ENCOUNTER — Ambulatory Visit (HOSPITAL_COMMUNITY): Payer: 59

## 2016-07-16 ENCOUNTER — Other Ambulatory Visit: Payer: Self-pay | Admitting: General Surgery

## 2016-07-16 ENCOUNTER — Telehealth: Payer: Self-pay | Admitting: *Deleted

## 2016-07-16 ENCOUNTER — Other Ambulatory Visit: Payer: 59

## 2016-07-16 DIAGNOSIS — Z170 Estrogen receptor positive status [ER+]: Principal | ICD-10-CM

## 2016-07-16 DIAGNOSIS — C50412 Malignant neoplasm of upper-outer quadrant of left female breast: Secondary | ICD-10-CM

## 2016-07-16 NOTE — Telephone Encounter (Signed)
  Oncology Nurse Navigator Documentation  Navigator Location: [facility] (07/16/16 1100)   )Navigator Encounter Type: Telephone (07/16/16 1100) Telephone: [facility] Call;Clinic/[facility] Follow-up (07/16/16 1100)                       Barriers/Navigation Needs: No barriers at this time (07/16/16 1100)                          Time Spent with Patient: 15 (07/16/16 1100)

## 2016-07-16 NOTE — Progress Notes (Signed)
Please let patient know that MRI shows slightly larger mass, but would still plan to do surgery first with lumpectomy.  Will write orders for surgery. Happy to see back to talk about plan if desired.

## 2016-07-20 ENCOUNTER — Telehealth (INDEPENDENT_AMBULATORY_CARE_PROVIDER_SITE_OTHER): Payer: Self-pay | Admitting: Orthopedic Surgery

## 2016-07-20 ENCOUNTER — Other Ambulatory Visit: Payer: Self-pay | Admitting: General Surgery

## 2016-07-20 ENCOUNTER — Encounter (HOSPITAL_BASED_OUTPATIENT_CLINIC_OR_DEPARTMENT_OTHER): Payer: Self-pay | Admitting: *Deleted

## 2016-07-20 DIAGNOSIS — C50412 Malignant neoplasm of upper-outer quadrant of left female breast: Secondary | ICD-10-CM

## 2016-07-20 DIAGNOSIS — Z170 Estrogen receptor positive status [ER+]: Principal | ICD-10-CM

## 2016-07-20 NOTE — Telephone Encounter (Signed)
Patient left message on my voice mail.  Wants to know the names of the 2 pain medications she took in 2013. CB [phone].

## 2016-07-23 NOTE — Telephone Encounter (Signed)
Pt had surgery in 2013 and the only rx written by Dr. Sharol Given at that time was Vicodin. I called pt to advise. She will call with any further questions.

## 2016-07-24 ENCOUNTER — Ambulatory Visit
Admission: RE | Admit: 2016-07-24 | Discharge: 2016-07-24 | Disposition: A | Discharge status: Home / Self Care | Payer: 59 | Source: Ambulatory Visit | Attending: General Surgery | Admitting: General Surgery

## 2016-07-24 DIAGNOSIS — Z170 Estrogen receptor positive status [ER+]: Principal | ICD-10-CM

## 2016-07-24 DIAGNOSIS — C50412 Malignant neoplasm of upper-outer quadrant of left female breast: Secondary | ICD-10-CM

## 2016-07-24 NOTE — Progress Notes (Signed)
Boost drink given with instructions to complete by 0930, pt verbalized understanding. Hibiclens given to patient with instructions/ pt verbalized understanding.

## 2016-07-25 ENCOUNTER — Other Ambulatory Visit: Payer: 59

## 2016-07-25 ENCOUNTER — Ambulatory Visit (HOSPITAL_BASED_OUTPATIENT_CLINIC_OR_DEPARTMENT_OTHER)
Admission: RE | Admit: 2016-07-25 | Discharge: 2016-07-25 | Disposition: A | Discharge status: Home / Self Care | Payer: Managed Care, Other (non HMO) | Source: Ambulatory Visit

## 2016-07-25 ENCOUNTER — Ambulatory Visit (HOSPITAL_COMMUNITY)
Admission: RE | Admit: 2016-07-25 | Discharge: 2016-07-25 | Disposition: A | Discharge status: Home / Self Care | Payer: Managed Care, Other (non HMO) | Source: Ambulatory Visit | Attending: Cardiology | Admitting: Cardiology

## 2016-07-25 ENCOUNTER — Encounter (HOSPITAL_COMMUNITY): Payer: Self-pay

## 2016-07-25 VITALS — BP 114/76 | HR 87 | Wt 148.0 lb

## 2016-07-25 DIAGNOSIS — I340 Nonrheumatic mitral (valve) insufficiency: Secondary | ICD-10-CM | POA: Insufficient documentation

## 2016-07-25 DIAGNOSIS — I071 Rheumatic tricuspid insufficiency: Secondary | ICD-10-CM | POA: Insufficient documentation

## 2016-07-25 DIAGNOSIS — Z170 Estrogen receptor positive status [ER+]: Secondary | ICD-10-CM

## 2016-07-25 DIAGNOSIS — C50412 Malignant neoplasm of upper-outer quadrant of left female breast: Secondary | ICD-10-CM | POA: Diagnosis not present

## 2016-07-25 LAB — ECHOCARDIOGRAM COMPLETE
AVLVOTPG: 4 mmHg
CHL CUP STROKE VOLUME: 48 mL
E/e' ratio: 5.65
EWDT: 292 ms
FS: 38 % (ref 28–44)
IVS/LV PW RATIO, ED: 0.85
LA ID, A-P, ES: 31 mm
LA diam end sys: 31 mm
LA diam index: 1.74 cm/m2
LA vol A4C: 35 ml
LA vol: 37.1 mL
LAVOLIN: 20.8 mL/m2
LV E/e' medial: 5.65
LV PW d: 9.7 mm — AB (ref 0.6–1.1)
LV SIMPSON'S DISK: 64
LV TDI E'MEDIAL: 7.07
LV dias vol index: 42 mL/m2
LV dias vol: 75 mL (ref 46–106)
LV e' LATERAL: 10.9 cm/s
LV sys vol: 27 mL (ref 14–42)
LVEEAVG: 5.65
LVOT SV: 62 mL
LVOT VTI: 19.9 cm
LVOT area: 3.14 cm2
LVOT diameter: 20 mm
LVOTPV: 106 cm/s
LVSYSVOLIN: 15 mL/m2
Lateral S' vel: 9.36 cm/s
MV Dec: 292
MV pk A vel: 58.2 m/s
MVPKEVEL: 61.6 m/s
RV TAPSE: 20.4 mm
RV sys press: 15 mmHg
Reg peak vel: 174 cm/s
TDI e' lateral: 10.9
TR max vel: 174 cm/s

## 2016-07-25 NOTE — H&P (Signed)
Jamie [name] Jamie [name] 07/11/2016 7:51 AM Location: [facility] Patient #: [id number] DOB: 10-Mar-1956 Undefined / Language: Jamie [name] / Race: White Female   History of Present Illness Jamie Klein MD; 07/25/2016 5:25 PM) The patient is a 61 year old female who presents with breast cancer. Patient is a 61 year old female who presented with a screening detected asymmetry in her left breast. She is referred for consultation by Jamie Reeve, DO for a new diagnosis of left breast cancer. Her diagnostic imaging following the abnormal mammogram showed a 7 mm macrolobulated mass at 1:00. 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 This was ER positive, PR negative, and HER-2 overexpressed. The ER staining was week. Ki-67 was 50%.  Of note, she has a mother that had ovarian cancer and a maternal aunt who had breast cancer twice. Father had skin cancer but she does not think this is melanoma. She had menarche at age 62. She is nulliparous as her children are adopted. She does not use hormone replacement therapy. She is no longer having periods. She did use birth control pills for a total of 2-4 years. She has had a colonoscopy in the last one was in 2009 or 10. She is up-to-date with bone density. She has had a Pap smear this month. She started getting mammograms 25 years ago.  pathology Diagnosis Breast, left, needle core biopsy, 1:00 o'clock - INVASIVE DUCTAL CARCINOMA WITH EXTRACELLULAR MUCIN, SEE COMMENT. - DUCTAL CARCINOMA IN SITU WITH CALCIFICATIONS.  dx mammogram/us FINDINGS: Mammographically, there is a partially obscured macrolobulated isodense to breast parenchyma mass in the left breast upper outer quadrant, posterior depth.  Mammographic images were processed with CAD.  On physical exam, no suspicious masses are palpated. There is a slight thickening in the left 12-1 o'clock breast, middle  to posterior depth.  Targeted ultrasound is performed, showing solid hypoechoic macrolobulated taller than wide mass in the left breast 1 o'clock 4 cm from the nipple which measures 0.7 x 0.8 x 0.8 cm.  There is no evidence of left axillary lymphadenopathy.  IMPRESSION: Solid left breast 1 o'clock mass, for which ultrasound-guided core needle biopsy is recommended.  CMET, CBC essentially normal.    Past Surgical History Jamie Pummel, RN; 07/11/2016 7:51 AM) Breast Biopsy  Left. Foot Surgery  Bilateral.  Diagnostic Studies History Jamie Pummel, RN; 07/11/2016 7:51 AM) Colonoscopy  5-10 years ago Mammogram  within last year Pap Smear  1-5 years ago  Medication History Jamie Pummel, RN; 07/11/2016 7:51 AM) Medications Reconciled  Social History Jamie Pummel, RN; 07/11/2016 7:51 AM) Alcohol use  Occasional alcohol use. No caffeine use  No drug use  Tobacco use  Never smoker.  Family History Jamie Pummel, RN; 07/11/2016 7:51 AM) Arthritis  Family Members In General. Breast Cancer  Family Members In General. Cerebrovascular Accident  Father. Diabetes Mellitus  Family Members In General. Heart Disease  Father. Hypertension  Father, Mother. Melanoma  Father. Ovarian Cancer  Mother.  Pregnancy / Birth History Jamie Pummel, RN; 07/11/2016 7:51 AM) Age at menarche  44 years. Age of menopause  [age] Contraceptive History  Oral contraceptives.  Other Problems Jamie Pummel, RN; 07/11/2016 7:51 AM) Anxiety Disorder  Arthritis  Back Pain  General anesthesia - complications  Melanoma     Review of Systems Jamie Spillers Ledford RN; 07/11/2016 7:51 AM) General Not Present- Appetite Loss, Chills, Fatigue, Fever, Night Sweats, Weight Gain and Weight Loss. Skin Present- Dryness. Not Present- Change  in Wart/Mole, Hives, Jaundice, New Lesions, Non-Healing Wounds, Rash and Ulcer. HEENT Present- Seasonal Allergies and Wears glasses/contact lenses.  Not Present- Earache, Hearing Loss, Hoarseness, Nose Bleed, Oral Ulcers, Ringing in the Ears, Sinus Pain, Sore Throat, Visual Disturbances and Yellow Eyes. Respiratory Not Present- Bloody sputum, Chronic Cough, Difficulty Breathing, Snoring and Wheezing. Breast Present- Breast Mass. Not Present- Breast Pain, Nipple Discharge and Skin Changes. Cardiovascular Not Present- Chest Pain, Difficulty Breathing Lying Down, Leg Cramps, Palpitations, Rapid Heart Rate, Shortness of Breath and Swelling of Extremities. Gastrointestinal Not Present- Abdominal Pain, Bloating, Bloody Stool, Change in Bowel Habits, Chronic diarrhea, Constipation, Difficulty Swallowing, Excessive gas, Gets full quickly at meals, Hemorrhoids, Indigestion, Nausea, Rectal Pain and Vomiting. Female Genitourinary Not Present- Frequency, Nocturia, Painful Urination, Pelvic Pain and Urgency. Musculoskeletal Present- Joint Stiffness. Not Present- Back Pain, Joint Pain, Muscle Pain, Muscle Weakness and Swelling of Extremities. Neurological Not Present- Decreased Memory, Fainting, Headaches, Numbness, Seizures, Tingling, Tremor, Trouble walking and Weakness. Psychiatric Present- Anxiety. Not Present- Bipolar, Change in Sleep Pattern, Depression, Fearful and Frequent crying. Endocrine Present- Hot flashes. Not Present- Cold Intolerance, Excessive Hunger, Hair Changes, Heat Intolerance and New Diabetes. Hematology Not Present- Blood Thinners, Easy Bruising, Excessive bleeding, Gland problems, HIV and Persistent Infections.  Vitals Jamie Klein MD; 07/25/2016 5:27 PM) 07/11/2016 5:26 PM Weight: 149.5 lb Height: 66in Body Surface Area: 1.77 m Body Mass Index: 24.13 kg/m  Temp.: 98.45F  Pulse: 69 (Regular)  Resp.: 20 (Unlabored)  BP: 108/64 (Sitting, Left Arm, Standard)       Physical Exam Jamie Klein MD; 07/25/2016 5:26 PM) General Mental Status-Alert. General Appearance-Consistent with stated age. Hydration-Well  hydrated. Voice-Normal.  Head and Neck Head-normocephalic, atraumatic with no lesions or palpable masses. Trachea-midline. Thyroid Gland Characteristics - normal size and consistency.  Eye Eyeball - Bilateral-Extraocular movements intact. Sclera/Conjunctiva - Bilateral-No scleral icterus.  Chest and Lung Exam Chest and lung exam reveals -quiet, even and easy respiratory effort with no use of accessory muscles and on auscultation, normal breath sounds, no adventitious sounds and normal vocal resonance. Inspection Chest Wall - Normal. Back - normal.  Breast Note: No palpable mass in either breast. Breast are minimally ptotic bilaterally. She has some bruising and tenderness at the biopsy site in the upper outer quadrant of the left breast. She has no nipple retraction or skin dimpling. No nipple discharge. No axillary lymphadenopathy.   Cardiovascular Cardiovascular examination reveals -normal heart sounds, regular rate and rhythm with no murmurs and normal pedal pulses bilaterally.  Abdomen Inspection Inspection of the abdomen reveals - No Hernias. Palpation/Percussion Palpation and Percussion of the abdomen reveal - Soft, Non Tender, No Rebound tenderness, No Rigidity (guarding) and No hepatosplenomegaly. Auscultation Auscultation of the abdomen reveals - Bowel sounds normal.  Neurologic Neurologic evaluation reveals -alert and oriented x 3 with no impairment of recent or remote memory. Mental Status-Normal.  Musculoskeletal Global Assessment -Note: no gross deformities.  Normal Exam - Left-Upper Extremity Strength Normal and Lower Extremity Strength Normal. Normal Exam - Right-Upper Extremity Strength Normal and Lower Extremity Strength Normal.  Lymphatic Head & Neck  General Head & Neck Lymphatics: Bilateral - Description - Normal. Axillary  General Axillary Region: Bilateral - Description - Normal. Tenderness - Non Tender. Femoral &  Inguinal  Generalized Femoral & Inguinal Lymphatics: Bilateral - Description - No Generalized lymphadenopathy.    Assessment & Plan Jamie Klein MD; 07/25/2016 5:30 PM) MALIGNANT NEOPLASM OF UPPER-OUTER QUADRANT OF LEFT BREAST IN FEMALE, ESTROGEN RECEPTOR POSITIVE (C50.412) Impression: Patient has a new  diagnosis of a clinical stage IB left breast cancer. We are recommending an MRI based on her breast density and positive HER-2/neu status. Assuming this does not show any dramatic differences, we recommend a seed localized lumpectomy with sentinel lymph node biopsy. She will need a port placement for adjuvant chemotherapy given her receptor profile. I reviewed the risks of surgery as well as the process by which this occurs. I discussed the block in the sentinel node injection as well as the seed placement. I reviewed that this is an outpatient operation and that she would need someone to drive her home and be with her overnight. I reviewed the risks of surgery including bleeding, infection, chronic pain, possible need for additional procedures, possible heart or lung complications, and others. She has a history of nausea and vomiting with anesthesia. I will make a note of that so that she can discuss additional antiemetics prior to and during surgery.  Patient will also be recommended to receive adjuvant radiation, genetic referral due to a mother with ovarian cancer and a personal history of breast cancer. She will also be recommended to receive adjuvant antiestrogen treatment.    Signed by Jamie Klein, MD (07/25/2016 5:32 PM)

## 2016-07-25 NOTE — Progress Notes (Signed)
Oncology: Dr. [name]  61 yo with history of breast cancer presents for cardio-oncology evaluation.  Left breast cancer was diagnosed in 2/18, ER+/PR-/HER2+.  She will have lumpectomy followed by Abraxane 12 cycles.  She will get Herceptin x 1 year to start later this month.    No prior cardiac problems.  She has never smoked.  Her father had an MI in his 80s. No chest pain, no exertional dyspnea.   PMH: 1. Breast cancer: Left breast cancer diagnosed in 2/18, ER+/PR-/HER2+.  She will have lumpectomy followed by Abraxane 12 cycles.  She will get Herceptin x 1 year to start later this month.  - Echo (3/18): EF 60-65%, GLS -21.8%.   Social History   Social History  . Marital status: Married    Spouse name: N/A  . Number of children: N/A  . Years of education: N/A   Occupational History  . Not on file.   Social History Main Topics  . Smoking status: Never Smoker  . Smokeless tobacco: Never Used  . Alcohol use 0.0 oz/week     Comment: 1-2  . Drug use: No  . Sexual activity: Not on file   Other Topics Concern  . Not on file   Social History Narrative  . No narrative on file   Family History  Problem Relation Age of Onset  . Cancer Mother     ovarian  . Hypertension Father   . Stroke Father   . Heart disease Father   . Cancer Maternal Aunt     breast  . Heart disease Paternal Grandmother   . Osteoporosis Sister    ROS: All systems reviewed and negative except as per HPI.   Current Outpatient Prescriptions  Medication Sig Dispense Refill  . CALCIUM PO Take by mouth 2 (two) times daily. Skeletal strength 363m Calium    . cetirizine (ZYRTEC) 10 MG tablet Take 10 mg by mouth daily.    . Cholecalciferol (VITAMIN D3) 1000 units CAPS Take 1 capsule by mouth daily.    . Cyanocobalamin (B-12) 2500 MCG TABS Take by mouth.    . Lactobacillus (ACIDOPHILUS PROBIOTIC PO) Take by mouth daily.    .Marland KitchenUNABLE TO FIND 2 (two) times daily. Protandim NRF2 Synergizer     No current  facility-administered medications for this encounter.    BP 114/76 (BP Location: Left Arm, Patient Position: Sitting, Cuff Size: Normal)   Pulse 87   Wt 148 lb (67.1 kg)   SpO2 97%   BMI 23.89 kg/m  General: NAD Neck: No JVD, no thyromegaly or thyroid nodule.  Lungs: Clear to auscultation bilaterally with normal respiratory effort. CV: Nondisplaced PMI.  Heart regular S1/S2, no S3/S4, no murmur.  No peripheral edema.  No carotid bruit.  Normal pedal pulses.  Abdomen: Soft, nontender, no hepatosplenomegaly, no distention.  Skin: Intact without lesions or rashes.  Neurologic: Alert and oriented x 3.  Psych: Normal affect. Extremities: No clubbing or cyanosis.  HEENT: Normal.   Assessment/Plan: 61yo with newly diagnosed breast cancer, ER+/PR-/HER2+.  She is planned for therapy involving 1 year of Herceptin.  This will start later this month.  I reviewed today's echo, normal EF and global longitudinal strain.  We discussed the potential cardiac risk of Herceptin and the rationale behind echo screening.  She will followup in 3 months with a repeat echo.  DLoralie Champagne3/11/2016

## 2016-07-25 NOTE — Patient Instructions (Signed)
Your physician recommends that you schedule a follow-up appointment in: 3 months with echocardiogram  

## 2016-07-25 NOTE — Progress Notes (Signed)
Echocardiogram 2D Echocardiogram has been performed.  Aggie Cosier 07/25/2016, 9:55 AM

## 2016-07-26 ENCOUNTER — Ambulatory Visit (HOSPITAL_COMMUNITY): Payer: Managed Care, Other (non HMO)

## 2016-07-26 ENCOUNTER — Encounter: Payer: Self-pay | Admitting: Osteopathic Medicine

## 2016-07-26 ENCOUNTER — Ambulatory Visit (HOSPITAL_BASED_OUTPATIENT_CLINIC_OR_DEPARTMENT_OTHER): Payer: Managed Care, Other (non HMO) | Admitting: Anesthesiology

## 2016-07-26 ENCOUNTER — Ambulatory Visit
Admission: RE | Admit: 2016-07-26 | Discharge: 2016-07-26 | Disposition: A | Discharge status: Home / Self Care | Payer: Managed Care, Other (non HMO) | Source: Ambulatory Visit | Attending: General Surgery | Admitting: General Surgery

## 2016-07-26 ENCOUNTER — Encounter (HOSPITAL_BASED_OUTPATIENT_CLINIC_OR_DEPARTMENT_OTHER): Payer: Self-pay | Admitting: Anesthesiology

## 2016-07-26 ENCOUNTER — Encounter (HOSPITAL_BASED_OUTPATIENT_CLINIC_OR_DEPARTMENT_OTHER)
Admission: RE | Disposition: A | Discharge status: Home / Self Care | Payer: Self-pay | Source: Ambulatory Visit | Attending: General Surgery

## 2016-07-26 ENCOUNTER — Ambulatory Visit (HOSPITAL_BASED_OUTPATIENT_CLINIC_OR_DEPARTMENT_OTHER)
Admission: RE | Admit: 2016-07-26 | Discharge: 2016-07-26 | Disposition: A | Discharge status: Home / Self Care | Payer: Managed Care, Other (non HMO) | Source: Ambulatory Visit | Attending: General Surgery | Admitting: General Surgery

## 2016-07-26 ENCOUNTER — Encounter (HOSPITAL_COMMUNITY)
Admission: RE | Admit: 2016-07-26 | Discharge: 2016-07-26 | Disposition: A | Discharge status: Home / Self Care | Payer: Managed Care, Other (non HMO) | Source: Ambulatory Visit | Attending: General Surgery | Admitting: General Surgery

## 2016-07-26 DIAGNOSIS — C50919 Malignant neoplasm of unspecified site of unspecified female breast: Secondary | ICD-10-CM | POA: Insufficient documentation

## 2016-07-26 DIAGNOSIS — Z803 Family history of malignant neoplasm of breast: Secondary | ICD-10-CM | POA: Diagnosis not present

## 2016-07-26 DIAGNOSIS — F419 Anxiety disorder, unspecified: Secondary | ICD-10-CM | POA: Diagnosis not present

## 2016-07-26 DIAGNOSIS — Z833 Family history of diabetes mellitus: Secondary | ICD-10-CM | POA: Diagnosis not present

## 2016-07-26 DIAGNOSIS — Z170 Estrogen receptor positive status [ER+]: Secondary | ICD-10-CM | POA: Diagnosis not present

## 2016-07-26 DIAGNOSIS — D0512 Intraductal carcinoma in situ of left breast: Secondary | ICD-10-CM | POA: Diagnosis not present

## 2016-07-26 DIAGNOSIS — C50412 Malignant neoplasm of upper-outer quadrant of left female breast: Secondary | ICD-10-CM

## 2016-07-26 DIAGNOSIS — Z8041 Family history of malignant neoplasm of ovary: Secondary | ICD-10-CM | POA: Insufficient documentation

## 2016-07-26 DIAGNOSIS — Z8249 Family history of ischemic heart disease and other diseases of the circulatory system: Secondary | ICD-10-CM | POA: Diagnosis not present

## 2016-07-26 DIAGNOSIS — C50912 Malignant neoplasm of unspecified site of left female breast: Secondary | ICD-10-CM | POA: Diagnosis present

## 2016-07-26 DIAGNOSIS — Z823 Family history of stroke: Secondary | ICD-10-CM | POA: Insufficient documentation

## 2016-07-26 DIAGNOSIS — M1990 Unspecified osteoarthritis, unspecified site: Secondary | ICD-10-CM | POA: Insufficient documentation

## 2016-07-26 DIAGNOSIS — Z8261 Family history of arthritis: Secondary | ICD-10-CM | POA: Insufficient documentation

## 2016-07-26 DIAGNOSIS — L309 Dermatitis, unspecified: Secondary | ICD-10-CM | POA: Insufficient documentation

## 2016-07-26 DIAGNOSIS — M549 Dorsalgia, unspecified: Secondary | ICD-10-CM | POA: Diagnosis not present

## 2016-07-26 DIAGNOSIS — E039 Hypothyroidism, unspecified: Secondary | ICD-10-CM | POA: Insufficient documentation

## 2016-07-26 DIAGNOSIS — Z95828 Presence of other vascular implants and grafts: Secondary | ICD-10-CM

## 2016-07-26 DIAGNOSIS — Z808 Family history of malignant neoplasm of other organs or systems: Secondary | ICD-10-CM | POA: Diagnosis not present

## 2016-07-26 DIAGNOSIS — Z853 Personal history of malignant neoplasm of breast: Secondary | ICD-10-CM

## 2016-07-26 HISTORY — DX: Other specified postprocedural states: Z98.890

## 2016-07-26 HISTORY — DX: Other specified postprocedural states: R11.2

## 2016-07-26 HISTORY — PX: PORTACATH PLACEMENT: SHX2246

## 2016-07-26 HISTORY — DX: Unspecified osteoarthritis, unspecified site: M19.90

## 2016-07-26 HISTORY — DX: Malignant neoplasm of unspecified site of unspecified female breast: C50.919

## 2016-07-26 HISTORY — PX: BREAST LUMPECTOMY WITH RADIOACTIVE SEED AND SENTINEL LYMPH NODE BIOPSY: SHX6550

## 2016-07-26 SURGERY — BREAST LUMPECTOMY WITH RADIOACTIVE SEED AND SENTINEL LYMPH NODE BIOPSY
Anesthesia: General | Site: Chest | Laterality: Right

## 2016-07-26 MED ORDER — ACETAMINOPHEN 500 MG PO TABS
ORAL_TABLET | ORAL | Status: AC
  Filled 2016-07-26: qty 2

## 2016-07-26 MED ORDER — MIDAZOLAM HCL 2 MG/2ML IJ SOLN
INTRAMUSCULAR | Status: AC
  Filled 2016-07-26: qty 2

## 2016-07-26 MED ORDER — SCOPOLAMINE 1 MG/3DAYS TD PT72
MEDICATED_PATCH | TRANSDERMAL | Status: AC
  Filled 2016-07-26: qty 1

## 2016-07-26 MED ORDER — FENTANYL CITRATE (PF) 100 MCG/2ML IJ SOLN
INTRAMUSCULAR | Status: AC
  Filled 2016-07-26: qty 2

## 2016-07-26 MED ORDER — DEXAMETHASONE SODIUM PHOSPHATE 10 MG/ML IJ SOLN
INTRAMUSCULAR | Status: AC
  Filled 2016-07-26: qty 1

## 2016-07-26 MED ORDER — MIDAZOLAM HCL 2 MG/2ML IJ SOLN
1.0000 mg | INTRAMUSCULAR | Status: DC | PRN
  Administered 2016-07-26 (×2): 2 mg via INTRAVENOUS

## 2016-07-26 MED ORDER — ACETAMINOPHEN 500 MG PO TABS
1000.0000 mg | ORAL_TABLET | ORAL | Status: AC
  Administered 2016-07-26: 1000 mg via ORAL

## 2016-07-26 MED ORDER — SCOPOLAMINE 1 MG/3DAYS TD PT72: 1.0000 | MEDICATED_PATCH | TRANSDERMAL | Status: DC

## 2016-07-26 MED ORDER — TECHNETIUM TC 99M SULFUR COLLOID FILTERED
1.0000 | Freq: Once | INTRAVENOUS | Status: AC | PRN
  Administered 2016-07-26: 1 via INTRADERMAL

## 2016-07-26 MED ORDER — SODIUM CHLORIDE 0.9 % IJ SOLN
INTRAMUSCULAR | Status: AC
  Filled 2016-07-26: qty 10

## 2016-07-26 MED ORDER — MIDAZOLAM HCL 2 MG/2ML IJ SOLN
INTRAMUSCULAR | Status: AC
Start: 2016-07-26 — End: 2016-07-26
  Filled 2016-07-26: qty 2

## 2016-07-26 MED ORDER — LIDOCAINE-EPINEPHRINE (PF) 1 %-1:200000 IJ SOLN
INTRAMUSCULAR | Status: DC | PRN
  Administered 2016-07-26: 10 mL

## 2016-07-26 MED ORDER — LACTATED RINGERS IV SOLN
INTRAVENOUS | Status: DC
  Administered 2016-07-26: 12:00:00 via INTRAVENOUS

## 2016-07-26 MED ORDER — LIDOCAINE-EPINEPHRINE (PF) 1 %-1:200000 IJ SOLN
INTRAMUSCULAR | Status: AC
  Filled 2016-07-26: qty 30

## 2016-07-26 MED ORDER — METOCLOPRAMIDE HCL 5 MG/ML IJ SOLN: 10.0000 mg | Freq: Once | INTRAMUSCULAR | Status: DC | PRN

## 2016-07-26 MED ORDER — OXYCODONE HCL 5 MG PO TABS: 5.0000 mg | ORAL_TABLET | Freq: Four times a day (QID) | ORAL | 0 refills | Status: DC | PRN

## 2016-07-26 MED ORDER — METHYLENE BLUE 0.5 % INJ SOLN
INTRAVENOUS | Status: AC
  Filled 2016-07-26: qty 10

## 2016-07-26 MED ORDER — SCOPOLAMINE 1 MG/3DAYS TD PT72
1.0000 | MEDICATED_PATCH | Freq: Once | TRANSDERMAL | Status: DC | PRN
  Administered 2016-07-26: 1.5 mg via TRANSDERMAL

## 2016-07-26 MED ORDER — DEXAMETHASONE SODIUM PHOSPHATE 4 MG/ML IJ SOLN
INTRAMUSCULAR | Status: DC | PRN
  Administered 2016-07-26: 10 mg via INTRAVENOUS

## 2016-07-26 MED ORDER — LIDOCAINE HCL (CARDIAC) 20 MG/ML IV SOLN
INTRAVENOUS | Status: DC | PRN
  Administered 2016-07-26: 30 mg via INTRAVENOUS

## 2016-07-26 MED ORDER — LIDOCAINE 2% (20 MG/ML) 5 ML SYRINGE
INTRAMUSCULAR | Status: AC
  Filled 2016-07-26: qty 5

## 2016-07-26 MED ORDER — ONDANSETRON HCL 4 MG/2ML IJ SOLN
INTRAMUSCULAR | Status: DC | PRN
  Administered 2016-07-26: 4 mg via INTRAVENOUS

## 2016-07-26 MED ORDER — HEPARIN (PORCINE) IN NACL 2-0.9 UNIT/ML-% IJ SOLN
INTRAMUSCULAR | Status: AC
  Filled 2016-07-26: qty 500

## 2016-07-26 MED ORDER — CHLORHEXIDINE GLUCONATE CLOTH 2 % EX PADS: 6.0000 | MEDICATED_PAD | Freq: Once | CUTANEOUS | Status: DC

## 2016-07-26 MED ORDER — HYDROCODONE-ACETAMINOPHEN 7.5-325 MG PO TABS: 1.0000 | ORAL_TABLET | Freq: Once | ORAL | Status: DC | PRN

## 2016-07-26 MED ORDER — EPHEDRINE SULFATE 50 MG/ML IJ SOLN
INTRAMUSCULAR | Status: DC | PRN
  Administered 2016-07-26: 10 mg via INTRAVENOUS
  Administered 2016-07-26 (×2): 15 mg via INTRAVENOUS

## 2016-07-26 MED ORDER — FENTANYL CITRATE (PF) 100 MCG/2ML IJ SOLN
50.0000 ug | INTRAMUSCULAR | Status: AC | PRN
  Administered 2016-07-26 (×3): 50 ug via INTRAVENOUS
  Administered 2016-07-26: 25 ug via INTRAVENOUS

## 2016-07-26 MED ORDER — PROPOFOL 10 MG/ML IV BOLUS
INTRAVENOUS | Status: DC | PRN
  Administered 2016-07-26: 150 mg via INTRAVENOUS
  Administered 2016-07-26: 20 mg via INTRAVENOUS

## 2016-07-26 MED ORDER — PROPOFOL 10 MG/ML IV BOLUS
INTRAVENOUS | Status: AC
  Filled 2016-07-26: qty 20

## 2016-07-26 MED ORDER — BUPIVACAINE HCL (PF) 0.25 % IJ SOLN
INTRAMUSCULAR | Status: AC
  Filled 2016-07-26: qty 30

## 2016-07-26 MED ORDER — EPHEDRINE 5 MG/ML INJ
INTRAVENOUS | Status: AC
  Filled 2016-07-26: qty 10

## 2016-07-26 MED ORDER — CEFAZOLIN SODIUM-DEXTROSE 2-4 GM/100ML-% IV SOLN
2.0000 g | INTRAVENOUS | Status: AC
Start: 2016-07-26 — End: 2016-07-26
  Administered 2016-07-26: 2 g via INTRAVENOUS

## 2016-07-26 MED ORDER — HEPARIN SOD (PORK) LOCK FLUSH 100 UNIT/ML IV SOLN
INTRAVENOUS | Status: AC
  Filled 2016-07-26: qty 5

## 2016-07-26 MED ORDER — HEPARIN SOD (PORK) LOCK FLUSH 100 UNIT/ML IV SOLN
INTRAVENOUS | Status: DC | PRN
Start: 2016-07-26 — End: 2016-07-26
  Administered 2016-07-26: 500 [IU] via INTRAVENOUS

## 2016-07-26 MED ORDER — BUPIVACAINE-EPINEPHRINE (PF) 0.5% -1:200000 IJ SOLN
INTRAMUSCULAR | Status: DC | PRN
  Administered 2016-07-26: 30 mL via PERINEURAL

## 2016-07-26 MED ORDER — ONDANSETRON HCL 4 MG/2ML IJ SOLN
INTRAMUSCULAR | Status: AC
  Filled 2016-07-26: qty 2

## 2016-07-26 MED ORDER — FENTANYL CITRATE (PF) 100 MCG/2ML IJ SOLN: 25.0000 ug | INTRAMUSCULAR | Status: DC | PRN

## 2016-07-26 MED ORDER — MEPERIDINE HCL 25 MG/ML IJ SOLN: 6.2500 mg | INTRAMUSCULAR | Status: DC | PRN

## 2016-07-26 MED ORDER — CEFAZOLIN SODIUM-DEXTROSE 2-4 GM/100ML-% IV SOLN
INTRAVENOUS | Status: AC
  Filled 2016-07-26: qty 100

## 2016-07-26 SURGICAL SUPPLY — 76 items
BAG DECANTER FOR FLEXI CONT (MISCELLANEOUS) ×3 IMPLANT
BINDER BREAST LRG (GAUZE/BANDAGES/DRESSINGS) ×3 IMPLANT
BINDER BREAST MEDIUM (GAUZE/BANDAGES/DRESSINGS) IMPLANT
BINDER BREAST XLRG (GAUZE/BANDAGES/DRESSINGS) IMPLANT
BINDER BREAST XXLRG (GAUZE/BANDAGES/DRESSINGS) IMPLANT
BLADE HEX COATED 2.75 (ELECTRODE) ×3 IMPLANT
BLADE SURG 10 STRL SS (BLADE) ×3 IMPLANT
BLADE SURG 11 STRL SS (BLADE) ×3 IMPLANT
BLADE SURG 15 STRL LF DISP TIS (BLADE) ×2 IMPLANT
BLADE SURG 15 STRL SS (BLADE) ×1
BNDG COHESIVE 4X5 TAN STRL (GAUZE/BANDAGES/DRESSINGS) ×3 IMPLANT
CANISTER SUC SOCK COL 7IN (MISCELLANEOUS) IMPLANT
CANISTER SUCT 1200ML W/VALVE (MISCELLANEOUS) ×3 IMPLANT
CHLORAPREP W/TINT 26ML (MISCELLANEOUS) ×3 IMPLANT
CLIP TI LARGE 6 (CLIP) ×3 IMPLANT
CLIP TI MEDIUM 6 (CLIP) ×6 IMPLANT
CLIP TI WIDE RED SMALL 6 (CLIP) IMPLANT
COVER BACK TABLE 60X90IN (DRAPES) ×3 IMPLANT
COVER MAYO STAND STRL (DRAPES) ×6 IMPLANT
COVER PROBE W GEL 5X96 (DRAPES) ×3 IMPLANT
DECANTER SPIKE VIAL GLASS SM (MISCELLANEOUS) ×3 IMPLANT
DERMABOND ADVANCED (GAUZE/BANDAGES/DRESSINGS) ×2
DERMABOND ADVANCED .7 DNX12 (GAUZE/BANDAGES/DRESSINGS) ×4 IMPLANT
DEVICE DUBIN W/COMP PLATE 8390 (MISCELLANEOUS) ×3 IMPLANT
DRAPE C-ARM 42X72 X-RAY (DRAPES) ×3 IMPLANT
DRAPE LAPAROTOMY TRNSV 102X78 (DRAPE) ×3 IMPLANT
DRAPE UTILITY XL STRL (DRAPES) ×3 IMPLANT
DRSG PAD ABDOMINAL 8X10 ST (GAUZE/BANDAGES/DRESSINGS) ×6 IMPLANT
DRSG TEGADERM 4X4.75 (GAUZE/BANDAGES/DRESSINGS) ×3 IMPLANT
ELECT COATED BLADE 2.86 ST (ELECTRODE) ×3 IMPLANT
ELECT REM PT RETURN 9FT ADLT (ELECTROSURGICAL) ×3
ELECTRODE REM PT RTRN 9FT ADLT (ELECTROSURGICAL) ×2 IMPLANT
GLOVE BIO SURGEON STRL SZ 6 (GLOVE) ×9 IMPLANT
GLOVE BIO SURGEON STRL SZ 6.5 (GLOVE) ×3 IMPLANT
GLOVE BIOGEL PI IND STRL 6.5 (GLOVE) ×6 IMPLANT
GLOVE BIOGEL PI IND STRL 7.0 (GLOVE) ×2 IMPLANT
GLOVE BIOGEL PI INDICATOR 6.5 (GLOVE) ×3
GLOVE BIOGEL PI INDICATOR 7.0 (GLOVE) ×1
GOWN STRL REUS W/ TWL LRG LVL3 (GOWN DISPOSABLE) ×2 IMPLANT
GOWN STRL REUS W/TWL 2XL LVL3 (GOWN DISPOSABLE) ×6 IMPLANT
GOWN STRL REUS W/TWL LRG LVL3 (GOWN DISPOSABLE) ×1
IV CONNECTOR ONE LINK NDLESS (IV SETS) IMPLANT
KIT MARKER MARGIN INK (KITS) ×3 IMPLANT
KIT PORT POWER 8FR ISP CVUE (Catheter) ×3 IMPLANT
LIGHT WAVEGUIDE WIDE FLAT (MISCELLANEOUS) ×3 IMPLANT
NDL SAFETY ECLIPSE 18X1.5 (NEEDLE) IMPLANT
NEEDLE HYPO 18GX1.5 SHARP (NEEDLE)
NEEDLE HYPO 25X1 1.5 SAFETY (NEEDLE) ×3 IMPLANT
NS IRRIG 1000ML POUR BTL (IV SOLUTION) ×3 IMPLANT
PACK BASIN DAY SURGERY FS (CUSTOM PROCEDURE TRAY) ×3 IMPLANT
PACK UNIVERSAL I (CUSTOM PROCEDURE TRAY) ×3 IMPLANT
PENCIL BUTTON HOLSTER BLD 10FT (ELECTRODE) ×3 IMPLANT
SLEEVE SCD COMPRESS KNEE MED (MISCELLANEOUS) ×3 IMPLANT
SPONGE GAUZE 4X4 12PLY STER LF (GAUZE/BANDAGES/DRESSINGS) ×3 IMPLANT
SPONGE LAP 18X18 X RAY DECT (DISPOSABLE) ×6 IMPLANT
STAPLER VISISTAT 35W (STAPLE) IMPLANT
STOCKINETTE IMPERVIOUS LG (DRAPES) ×3 IMPLANT
STRIP CLOSURE SKIN 1/2X4 (GAUZE/BANDAGES/DRESSINGS) ×3 IMPLANT
SUT ETHILON 2 0 FS 18 (SUTURE) IMPLANT
SUT MNCRL AB 4-0 PS2 18 (SUTURE) ×9 IMPLANT
SUT MON AB 5-0 PS2 18 (SUTURE) IMPLANT
SUT PROLENE 2 0 SH DA (SUTURE) ×6 IMPLANT
SUT SILK 2 0 SH (SUTURE) IMPLANT
SUT VIC AB 2-0 SH 27 (SUTURE) ×1
SUT VIC AB 2-0 SH 27XBRD (SUTURE) ×2 IMPLANT
SUT VIC AB 3-0 SH 27 (SUTURE) ×2
SUT VIC AB 3-0 SH 27X BRD (SUTURE) ×4 IMPLANT
SUT VICRYL 3-0 CR8 SH (SUTURE) IMPLANT
SYR 10ML LL (SYRINGE) ×3 IMPLANT
SYR 5ML LUER SLIP (SYRINGE) ×3 IMPLANT
SYR BULB 3OZ (MISCELLANEOUS) ×3 IMPLANT
SYR CONTROL 10ML LL (SYRINGE) ×3 IMPLANT
TOWEL OR 17X24 6PK STRL BLUE (TOWEL DISPOSABLE) ×3 IMPLANT
TOWEL OR NON WOVEN STRL DISP B (DISPOSABLE) ×3 IMPLANT
TUBE CONNECTING 20X1/4 (TUBING) ×3 IMPLANT
YANKAUER SUCT BULB TIP NO VENT (SUCTIONS) ×3 IMPLANT

## 2016-07-26 NOTE — Anesthesia Postprocedure Evaluation (Signed)
Anesthesia Post Note  Patient: Jamie [name]  Procedure(s) Performed: Procedure(s) (LRB): BREAST LUMPECTOMY WITH RADIOACTIVE SEED AND SENTINEL LYMPH NODE BIOPSY (Left) INSERTION PORT-A-CATH (Right)  Patient location during evaluation: PACU Anesthesia Type: General Level of consciousness: awake and alert and oriented Pain management: pain level controlled Vital Signs Assessment: post-procedure vital signs reviewed and stable Respiratory status: spontaneous breathing, nonlabored ventilation and respiratory function stable Cardiovascular status: blood pressure returned to baseline and stable Postop Assessment: no signs of nausea or vomiting Anesthetic complications: no       Last Vitals:  Vitals:   07/26/16 1615 07/26/16 1630  BP: (!) 110/96 114/67  Pulse: 85 77  Resp: 12 (!) 8  Temp:      Last Pain:  Vitals:   07/26/16 1630  TempSrc:   PainSc: 0-No pain                 Aminata Buffalo A.

## 2016-07-26 NOTE — Interval H&P Note (Signed)
History and Physical Interval Note:  07/26/2016 1:48 PM  [name]  has presented today for surgery, with the diagnosis of LEFT BREAST CANCER  The various methods of treatment have been discussed with the patient and family. After consideration of risks, benefits and other options for treatment, the patient has consented to  Procedure(s): BREAST LUMPECTOMY WITH RADIOACTIVE SEED AND SENTINEL LYMPH NODE BIOPSY (Left) INSERTION PORT-A-CATH (N/A) as a surgical intervention .  The patient's history has been reviewed, patient examined, no change in status, stable for surgery.  I have reviewed the patient's chart and labs.  Questions were answered to the patient's satisfaction.     Jamie [name]

## 2016-07-26 NOTE — Discharge Instructions (Addendum)
[facility] °Office Phone Number [phone] ° °BREAST BIOPSY/ PARTIAL MASTECTOMY: POST OP INSTRUCTIONS ° °Always review your discharge instruction sheet given to you by the facility where your surgery was performed. ° °IF YOU HAVE DISABILITY OR FAMILY LEAVE FORMS, YOU MUST BRING THEM TO THE OFFICE FOR PROCESSING.  DO NOT GIVE THEM TO YOUR DOCTOR. ° °1. A prescription for pain medication may be given to you upon discharge.  Take your pain medication as prescribed, if needed.  If narcotic pain medicine is not needed, then you may take acetaminophen (Tylenol) or ibuprofen (Advil) as needed. °2. Take your usually prescribed medications unless otherwise directed °3. If you need a refill on your pain medication, please contact your pharmacy.  They will contact our office to request authorization.  Prescriptions will not be filled after 5pm or on week-ends. °4. You should eat very light the first 24 hours after surgery, such as soup, crackers, pudding, etc.  Resume your normal diet the day after surgery. °5. Most patients will experience some swelling and bruising in the breast.  Ice packs and a good support bra will help.  Swelling and bruising can take several days to resolve.  °6. It is common to experience some constipation if taking pain medication after surgery.  Increasing fluid intake and taking a stool softener will usually help or prevent this problem from occurring.  A mild laxative (Milk of Magnesia or Miralax) should be taken according to package directions if there are no bowel movements after 48 hours. °7. Unless discharge instructions indicate otherwise, you may remove your bandages 48 hours after surgery, and you may shower at that time.  You may have steri-strips (small skin tapes) in place directly over the incision.  These strips should be left on the skin for 7-10 days.   Any sutures or staples will be removed at the office during your follow-up visit. °8. ACTIVITIES:  You may resume  regular daily activities (gradually increasing) beginning the next day.  Wearing a good support bra or sports bra (or the breast binder) minimizes pain and swelling.  You may have sexual intercourse when it is comfortable. °a. You may drive when you no longer are taking prescription pain medication, you can comfortably wear a seatbelt, and you can safely maneuver your car and apply brakes. °b. RETURN TO WORK:  __________1 week_______________ °9. You should see your doctor in the office for a follow-up appointment approximately two weeks after your surgery.  Your doctor’s nurse will typically make your follow-up appointment when she calls you with your pathology report.  Expect your pathology report 2-3 business days after your surgery.  You may call to check if you do not hear from us after three days. ° ° °WHEN TO CALL YOUR DOCTOR: °1. Fever over 101.0 °2. Nausea and/or vomiting. °3. Extreme swelling or bruising. °4. Continued bleeding from incision. °5. Increased pain, redness, or drainage from the incision. ° °The clinic staff is available to answer your questions during regular business hours.  Please don’t hesitate to call and ask to speak to one of the nurses for clinical concerns.  If you have a medical emergency, go to the nearest emergency room or call 911.  A surgeon from [facility] is always on call at the hospital. ° °For further questions, please visit [email or web address]  ° ° °Post Anesthesia Home Care Instructions ° °Activity: °Get plenty of rest for the remainder of the day. A responsible adult should stay with you for 24   hours following the procedure.  °For the next 24 hours, DO NOT: °-Drive a car °-Operate machinery °-Drink alcoholic beverages °-Take any medication unless instructed by your physician °-Make any legal decisions or sign important papers. ° °Meals: °Start with liquid foods such as gelatin or soup. Progress to regular foods as tolerated. Avoid greasy, spicy, heavy  foods. If nausea and/or vomiting occur, drink only clear liquids until the nausea and/or vomiting subsides. Call your physician if vomiting continues. ° °Special Instructions/Symptoms: °Your throat may feel dry or sore from the anesthesia or the breathing tube placed in your throat during surgery. If this causes discomfort, gargle with warm salt water. The discomfort should disappear within 24 hours. ° °If you had a scopolamine patch placed behind your ear for the management of post- operative nausea and/or vomiting: ° °1. The medication in the patch is effective for 72 hours, after which it should be removed.  Wrap patch in a tissue and discard in the trash. Wash hands thoroughly with soap and water. °2. You may remove the patch earlier than 72 hours if you experience unpleasant side effects which may include dry mouth, dizziness or visual disturbances. °3. Avoid touching the patch. Wash your hands with soap and water after contact with the patch. °  ° °

## 2016-07-26 NOTE — Anesthesia Preprocedure Evaluation (Signed)
Anesthesia Evaluation  Patient identified by MRN, date of birth, ID band Patient awake    Reviewed: Allergy & Precautions, NPO status , Patient's Chart, lab work & pertinent test results  History of Anesthesia Complications (+) PONV and history of anesthetic complications  Airway Mallampati: II  TM Distance: >3 FB Neck ROM: Full    Dental no notable dental hx. (+) Teeth Intact   Pulmonary neg pulmonary ROS,    Pulmonary exam normal breath sounds clear to auscultation       Cardiovascular negative cardio ROS Normal cardiovascular exam Rhythm:Regular Rate:Normal     Neuro/Psych Anxiety negative neurological ROS     GI/Hepatic negative GI ROS, Neg liver ROS,   Endo/Other  Hypothyroidism Left breast Ca  Renal/GU negative Renal ROS  negative genitourinary   Musculoskeletal  (+) Arthritis , Osteoarthritis,  Eczema   Abdominal   Peds  Hematology negative hematology ROS (+)   Anesthesia Other Findings   Reproductive/Obstetrics                             Anesthesia Physical Anesthesia Plan  ASA: II  Anesthesia Plan: General and Regional   Post-op Pain Management:  Regional for Post-op pain   Induction: Intravenous  Airway Management Planned: LMA  Additional Equipment:   Intra-op Plan:   Post-operative Plan: Extubation in OR  Informed Consent: I have reviewed the patients History and Physical, chart, labs and discussed the procedure including the risks, benefits and alternatives for the proposed anesthesia with the patient or authorized representative who has indicated his/her understanding and acceptance.   Dental advisory given  Plan Discussed with: Anesthesiologist, CRNA and Surgeon  Anesthesia Plan Comments:         Anesthesia Quick Evaluation

## 2016-07-26 NOTE — Transfer of Care (Signed)
Immediate Anesthesia Transfer of Care Note  Patient: Jamie [name]  Procedure(s) Performed: Procedure(s): BREAST LUMPECTOMY WITH RADIOACTIVE SEED AND SENTINEL LYMPH NODE BIOPSY (Left) INSERTION PORT-A-CATH (Right)  Patient Location: PACU  Anesthesia Type:General  Level of Consciousness: awake and sedated  Airway & Oxygen Therapy: Patient Spontanous Breathing and Patient connected to face mask oxygen  Post-op Assessment: Report given to RN and Post -op Vital signs reviewed and stable  Post vital signs: Reviewed and stable  Last Vitals:  Vitals:   07/26/16 1305 07/26/16 1310  BP: (!) 95/58 (!) 103/54  Pulse: 92 93  Resp: 16 16  Temp:      Last Pain:  Vitals:   07/26/16 1141  TempSrc: Oral  PainSc: 0-No pain         Complications: No apparent anesthesia complications

## 2016-07-26 NOTE — Progress Notes (Signed)
Nuc med staff performed nuc med inj. Pt tol well with additional fentanyl for sedation. Will retrieve family from lobby and update/provide emotional support.

## 2016-07-26 NOTE — Progress Notes (Signed)
Assisted Dr. Royce Macadamia with left, ultrasound guided, pectoralis block. Side rails up, monitors on throughout procedure. See vital signs in flow sheet. Tolerated Procedure well.

## 2016-07-26 NOTE — Anesthesia Procedure Notes (Signed)
Procedure Name: LMA Insertion Date/Time: 07/26/2016 1:59 PM Performed by: Melynda Ripple D Pre-anesthesia Checklist: Patient identified, Emergency Drugs available, Suction available and Patient being monitored Patient Re-evaluated:Patient Re-evaluated prior to inductionOxygen Delivery Method: Circle system utilized Preoxygenation: Pre-oxygenation with 100% oxygen Intubation Type: IV induction Ventilation: Mask ventilation without difficulty LMA: LMA inserted LMA Size: 3.0 Number of attempts: 1 Airway Equipment and Method: Bite block Placement Confirmation: positive ETCO2 Tube secured with: Tape Dental Injury: Teeth and Oropharynx as per pre-operative assessment

## 2016-07-26 NOTE — Op Note (Signed)
Left Breast Radioactive seed localized lumpectomy and sentinel lymph node biopsy, port placement  Indications: This patient presents with history of left breast cancer, UOQ, cT1bN0, ER+/PR-/Her2+  Pre-operative Diagnosis: left breast cancer  Post-operative Diagnosis: same  Surgeon: [name]   Anesthesia: General endotracheal anesthesia  ASA Class: 2  Procedure Details  The patient was seen in the Holding Room. The risks, benefits, complications, treatment options, and expected outcomes were discussed with the patient. The possibilities of bleeding, infection, the need for additional procedures, failure to diagnose a condition, and creating a complication requiring transfusion or operation were discussed with the patient. The patient concurred with the proposed plan, giving informed consent.  The site of surgery properly noted/marked. The patient was taken to Operating Room # 7, identified, and the procedure verified as Left Breast Seed localized Lumpectomy, sentinel lymph node biopsy, and port placement. A Time Out was held and the above information confirmed.  The left arm, breast, and bilateral chest were prepped and draped in standard fashion.  Local anesthetic was administered over this   area at the angle of the clavicle.  The vein was accessed with 3 passes of the needle. There was good venous return and the wire passed easily with no ectopy.   Fluoroscopy was used to confirm that the wire was in the vena cava.      The patient was placed back level and the area for the pocket was anethetized   with local anesthetic.  A 3-cm transverse incision was made with a #15   blade.  Cautery was used to divide the subcutaneous tissues down to the   pectoralis muscle.  An [employer] retractor was used to elevate the skin   while a pocket was created on top of the pectoralis fascia.  The port   was placed into the pocket to confirm that it was of adequate size.  The   catheter was preattached  to the port.  The port was then secured to the   pectoralis fascia with four 2-0 Prolene sutures.  These were clamped and   not tied down yet.    The catheter was tunneled through to the wire exit   site.  The catheter was placed along the wire to determine what length it should be to be in the SVC.  The catheter was cut at 20 cm.  The tunneler sheath and dilator were passed over the wire and the dilator and wire were removed.  The catheter was advanced through the tunneler sheath and the tunneler sheath was pulled away.  Care was taken to keep the catheter in the tunneler sheath as this occurred. This was advanced and the tunneler sheath was removed.  There was good venous   return and easy flush of the catheter.  The Prolene sutures were tied   down to the pectoral fascia.  The skin was reapproximated using 3-0   Vicryl interrupted deep dermal sutures.    Fluoroscopy was used to re-confirm good position of the catheter.  The skin   was then closed using 4-0 Monocryl in a subcuticular fashion.  The port was flushed with concentrated heparin flush as well.   The lumpectomy was performed by creating an transverse incision over the upper outer quadrant of the breast over the previously placed radioactive seed.  Dissection was carried down to around the point of maximum signal intensity. The cautery was used to perform the dissection.  Hemostasis was achieved with cautery. The edges of the cavity were  marked with large clips, with one each medial, lateral, inferior and superior, and two clips posteriorly.   The specimen was inked with the margin marker paint kit.    Specimen radiography confirmed inclusion of the mammographic lesion, the clip, and the seed.  The background signal in the breast was zero.  The wound was irrigated and closed with 3-0 vicryl in layers and 4-0 monocryl subcuticular suture.    Using a hand-held gamma probe, two deep axillary sentinel nodes were identified transcutaneously.  An  oblique incision was created below the axillary hairline.  Dissection was carried through the clavipectoral fascia.  Two level 2 axillary sentinel nodes were removed.  Counts per second were 400 and 120.    The background count was 0 cps.  The wound was irrigated.  Hemostasis was achieved with cautery.  The axillary incision was closed with a 3-0 vicryl deep dermal interrupted sutures and a 4-0 monocryl subcuticular closure.    Sterile dressings were applied. At the end of the operation, all sponge, instrument, and needle counts were correct.  Findings: grossly clear surgical margins and no adenopathy  Estimated Blood Loss:  min         Specimens: Left breast lumpectomy and two left axillary sentinel lymph nodes.             Complications:  None; patient tolerated the procedure well.         Disposition: PACU - hemodynamically stable.         Condition: stable

## 2016-07-26 NOTE — Anesthesia Procedure Notes (Addendum)
Anesthesia Regional Block: Pectoralis block   Pre-Anesthetic Checklist: ,, timeout performed, Correct Patient, Correct Site, Correct Laterality, Correct Procedure, Correct Position, site marked, Risks and benefits discussed,  Surgical consent,  Pre-op evaluation,  At surgeon's request and post-op pain management  Laterality: Left  Prep: chloraprep       Needles:  Injection technique: Single-shot  Needle Type: Echogenic Stimulator Needle     Needle Length: 9cm  Needle Gauge: 21   Needle insertion depth: 5 cm   Additional Needles:   Procedures: ultrasound guided,,,,,,,,  Narrative:  Start time: 07/26/2016 12:54 PM End time: 07/26/2016 1:00 PM Injection made incrementally with aspirations every 5 mL.  Performed by: Personally  Anesthesiologist: Josephine Igo  Additional Notes: Timeout performed. Patient sedated. Relevant anatomy ID'd using [place]. Incremental 93ml injection with frequent aspiration. Patient tolerated procedure well.

## 2016-07-27 ENCOUNTER — Other Ambulatory Visit (HOSPITAL_COMMUNITY): Payer: 59

## 2016-07-27 ENCOUNTER — Ambulatory Visit (HOSPITAL_COMMUNITY): Payer: 59

## 2016-07-29 ENCOUNTER — Encounter (HOSPITAL_BASED_OUTPATIENT_CLINIC_OR_DEPARTMENT_OTHER): Payer: Self-pay | Admitting: General Surgery

## 2016-07-29 NOTE — Progress Notes (Signed)
Please let patient know margins are negative and lymph nodes are negative.

## 2016-07-30 ENCOUNTER — Other Ambulatory Visit: Payer: Managed Care, Other (non HMO)

## 2016-07-30 ENCOUNTER — Ambulatory Visit (HOSPITAL_BASED_OUTPATIENT_CLINIC_OR_DEPARTMENT_OTHER): Payer: Managed Care, Other (non HMO) | Admitting: Genetics

## 2016-07-30 ENCOUNTER — Encounter: Payer: Self-pay | Admitting: Genetics

## 2016-07-30 DIAGNOSIS — Z803 Family history of malignant neoplasm of breast: Secondary | ICD-10-CM

## 2016-07-30 DIAGNOSIS — Z315 Encounter for genetic counseling: Secondary | ICD-10-CM

## 2016-07-30 DIAGNOSIS — Z8041 Family history of malignant neoplasm of ovary: Secondary | ICD-10-CM

## 2016-07-30 DIAGNOSIS — Z853 Personal history of malignant neoplasm of breast: Secondary | ICD-10-CM

## 2016-07-30 DIAGNOSIS — C50912 Malignant neoplasm of unspecified site of left female breast: Secondary | ICD-10-CM | POA: Diagnosis not present

## 2016-07-30 NOTE — Progress Notes (Signed)
REFERRING PROVIDER: Emeterio Reeve, DO [place],  [id number]  PRIMARY PROVIDER:  Emeterio Reeve, DO  PRIMARY REASON FOR VISIT:  1. Family history of ovarian cancer   2. Family history of breast cancer   3. Personal history of breast cancer    HISTORY OF PRESENT ILLNESS:   [name], a 61 y.o. female, was seen for a [facility] cancer genetics consultation at the request of Dr. Sheppard Coil due to a personal and family history of cancer.  Ms. Ekblad presents to clinic today to discuss the possibility of a hereditary predisposition to cancer, genetic testing, and to further clarify her future cancer risks, as well as potential cancer risks for family members. She is accompanied to her appointment by her husband.  In February 2018, at the age of 31, Ms. Mullan was diagnosed with invasive ductal carcinoma of the left breast. This was treated with lumpectomy on 07/26/2016. She plans chemotherapy, anti-HER-2 immunotherapy, radiation, and anti-estrogens.  Ms. Keel also reports a history of basal cell carcinoma.  CANCER HISTORY:   No history exists.    HORMONAL RISK FACTORS:  Menarche was at age 78.  First live birth at age nulliparous. Has adopted son and daughter.  OCP use for approximately 3 years.  Ovaries intact: yes.  Hysterectomy: no.  Menopausal status: postmenopausal.  HRT use: 0 years. Colonoscopy: yes; not examined. Mammogram within the last year: yes. Number of breast biopsies: 1. Up to date with pelvic exams:  yes. Any excessive radiation exposure in the past:  no  Past Medical History:  Diagnosis Date  . Anxiety   . Arthritis    feet  . Basal cell carcinoma   . Breast cancer ([facility]) 06/2016   left breast  . Eczema   . PONV (postoperative nausea and vomiting)     Past Surgical History:  Procedure Laterality Date  . BONE SPUR  2001 AND 1988  . BREAST LUMPECTOMY WITH RADIOACTIVE SEED AND SENTINEL LYMPH NODE BIOPSY Left 07/26/2016   Procedure: BREAST LUMPECTOMY WITH RADIOACTIVE SEED AND SENTINEL LYMPH NODE BIOPSY;  Surgeon: [name], MD;  Location: [facility];  Service: General;  Laterality: Left;  . BUNIONECTOMY  10/13  . COLONOSCOPY W/ POLYPECTOMY  08/2007  . DILATION AND CURETTAGE OF UTERUS  2002  . MOHS SURGERY  2010  . PORTACATH PLACEMENT Right 07/26/2016   Procedure: INSERTION PORT-A-CATH;  Surgeon: [name], MD;  Location: [facility];  Service: General;  Laterality: Right;    Social History   Social History  . Marital status: Married    Spouse name: N/A  . Number of children: N/A  . Years of education: N/A   Social History Main Topics  . Smoking status: Never Smoker  . Smokeless tobacco: Never Used  . Alcohol use 0.0 oz/week     Comment: 1-2  . Drug use: No  . Sexual activity: Not on file   Other Topics Concern  . Not on file   Social History Narrative  . No narrative on file     FAMILY HISTORY:  We obtained a detailed, 4-generation family history.  Significant diagnoses are listed below: Family History  Problem Relation Age of Onset  . Ovarian cancer Mother 35  . Hypertension Father   . Stroke Father   . Heart disease Father   . Breast cancer Maternal Aunt 77    recurred at 39  . Heart disease Paternal Grandmother   . Osteoporosis Sister   .  Liver cancer Maternal Uncle 60   Ms. Wyse has one sister, age 35, without cancers. Ms. Maniscalco has an adopted son and daughter.   Ms. Jagger mother was diagnosed with ovarian cancer at 52 and died at 12. Ms. Rothbauer's mother had 7 brothers and 10 sisters. Two sisters and one brother died in infancy/at birth. The rest lived to adulthood, with most living beyond age 34. Of these siblings, one sister had post-menopausal breast cancer and a brother had liver cancer at 73. Ms. Remlinger has many maternal first-cousins. To her knowledge, none have had cancer.  Ms. Guzek father had a history of skin cancers and died at age  53. He had one brother (deceased at 39) and one sister (deceased at 90) with no known cancers.  All of Ms. Shanley's grandparents lived beyond age 38 without cancers. There are no known cancers is great-aunts, great-uncles, or great-grandparents.  Ms. Goodenow is unaware of previous family history of genetic testing for hereditary cancer risks. Patient's maternal ancestors are of Caucasian descent, and paternal ancestors are of [place] descent. There no reported Ashkenazi Jewish ancestry. There is no known consanguinity.  GENETIC COUNSELING ASSESSMENT: SHAQUANNA LYCAN is a 61 y.o. female with a recent personal diagnosis of breast cancer and a family history of ovarian cancer which is somewhat suggestive of Hereditary Breast and Ovarian Cancer Syndrome and predisposition to cancer. We, therefore, discussed and recommended the following at today's visit.   DISCUSSION: We reviewed the characteristics, features and inheritance patterns of hereditary cancer syndromes. We also discussed genetic testing, including the appropriate family members to test, the process of testing, insurance coverage and turn-around-time for results. We discussed the implications of a negative, positive and/or variant of uncertain significant result. We recommended Ms. Adsit pursue genetic testing for the 43-gene Common Hereditary Cancer panel offered by [facility].   Based on Ms. Wamser's personal and family history of cancer, she meets medical criteria for genetic testing. Despite that she meets criteria, she may still have an out of pocket cost. We discussed that if her out of pocket cost for testing is over $100, the laboratory will call and confirm whether she wants to proceed with testing.  If the out of pocket cost of testing is less than $100 she will be billed by the genetic testing laboratory.    PLAN: After considering the risks, benefits, and limitations, Ms. Callari  provided informed consent to pursue genetic testing and the blood  sample was sent to [facility] for analysis of the 43-gene Common Hereditary Cancer panel. Results should be available within approximately 3 weeks' time, at which point they will be disclosed by telephone to Ms. Carico, as will any additional recommendations warranted by these results. Ms. Mongillo will receive a summary of her genetic counseling visit and a copy of her results once available. This information will also be available in Epic. We encouraged Ms. Baine to remain in contact with cancer genetics annually so that we can continuously update the family history and inform her of any changes in cancer genetics and testing that may be of benefit for her family. Ms. Nunley questions were answered to her satisfaction today. Our contact information was provided should additional questions or concerns arise.  Ms.  Buckholtz questions were answered to her satisfaction today. Our contact information was provided should additional questions or concerns arise. Thank you for the referral and allowing [place] to share in the care of your patient.   Mal Misty, MS, [facility] Certified Genetic  [email or web address] phone: [phone]  The patient was seen for a total of 35 minutes in face-to-face genetic counseling.  This patient was discussed with Drs. [name], Lindi Adie and/or Burr Medico who agrees with the above.    _______________________________________________________________________ For Office Staff:  Number of people involved in session: 2 Was an Intern/ student involved with case: no

## 2016-08-03 ENCOUNTER — Ambulatory Visit (HOSPITAL_BASED_OUTPATIENT_CLINIC_OR_DEPARTMENT_OTHER): Payer: Managed Care, Other (non HMO) | Admitting: Oncology

## 2016-08-03 ENCOUNTER — Telehealth: Payer: Self-pay | Admitting: Oncology

## 2016-08-03 VITALS — BP 122/71 | HR 81 | Temp 98.9°F | Resp 18 | Wt 149.8 lb

## 2016-08-03 DIAGNOSIS — Z170 Estrogen receptor positive status [ER+]: Secondary | ICD-10-CM

## 2016-08-03 DIAGNOSIS — C50412 Malignant neoplasm of upper-outer quadrant of left female breast: Secondary | ICD-10-CM

## 2016-08-03 MED ORDER — PROCHLORPERAZINE MALEATE 10 MG PO TABS: 10.0000 mg | ORAL_TABLET | Freq: Four times a day (QID) | ORAL | 1 refills | Status: DC | PRN

## 2016-08-03 MED ORDER — ONDANSETRON HCL 8 MG PO TABS: 8.0000 mg | ORAL_TABLET | Freq: Two times a day (BID) | ORAL | 1 refills | Status: DC | PRN

## 2016-08-03 MED ORDER — LIDOCAINE-PRILOCAINE 2.5-2.5 % EX CREA: TOPICAL_CREAM | CUTANEOUS | 3 refills | Status: DC

## 2016-08-03 NOTE — Progress Notes (Signed)
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 This showed a possible mass in the left breast. On 07/02/2016 she underwent left diagnostic mammography with tomography and left breast ultrasonography. The breast density was category C. In the left breast upper outer quadrant there was a microlobulated mass which was not directly palpable, although there was slight thickening in the left breast 1:00 position. Targeted ultrasonography confirmed a solid hypoechoic microlobulated mass in the left breast 1:00 radiant 4 cm from the nipple, measuring 0.8 cm.  On 07/03/2016 Jamie [name] underwent biopsy of the left breast mass in question, and this showed ([id number]) invasive ductal carcinoma, grade 2 or 3, with extracellular mucin, estrogen receptor 5% positive with weak staining intensity, progesterone receptor negative, with an MIB-1 of 50%, and HER-2 amplified, the signals ratio being 5.71 and the number per cell 14.55.  Her subsequent history is as detailed below  INTERVAL HISTORY: Jamie [name] returns today for follow-up of her HER-2/neu positive breast cancer. Since her last visit here she underwent left lumpectomy and sentinel lymph node  sampling, performed 07/26/2016. The final pathology ([id number]) showed an invasive ductal carcinoma, grade 3, measuring 1.5 cm, with extracellular mucin. Margins were negative. Both sentinel lymph nodes were clear  She also had a right subclavian port placed, with the tip at the cavoatrial junction.  Infiltration her anti-HER-2 treatment and chemotherapy she had an echocardiogram 07/25/2016 which showed an excellent ejection fraction at 60-65%.  She is now ready to start her adjuvant therapy.  REVIEW OF SYSTEMS: She was protecting her right shoulder initially because the port is a little bit bothersome, but as she stretches it and exercises more that is improving. She did have of course soreness and she was taking the Percocet half tablets usually at bedtime. She then transitioned to Advil and Aleve and today she has taken no analgesics whatsoever. She is anxious, but not depressed. A detailed review of systems today was otherwise stable.[name]   PAST MEDICAL HISTORY: Past Medical History:  Diagnosis Date  . Anxiety   . Arthritis    feet  . Basal cell carcinoma   . Breast cancer ([place]) 06/2016   left breast  . Eczema   . PONV (postoperative nausea and vomiting)     PAST SURGICAL HISTORY: Past Surgical History:  Procedure Laterality Date  . BONE SPUR  2001 AND 1988  . BREAST LUMPECTOMY WITH RADIOACTIVE SEED AND SENTINEL LYMPH NODE BIOPSY Left 07/26/2016   Procedure: BREAST LUMPECTOMY WITH RADIOACTIVE SEED AND SENTINEL LYMPH NODE BIOPSY;  Surgeon: [name], MD;  Location: [place];  Service: General;  Laterality: Left;  . BUNIONECTOMY  10/13  . COLONOSCOPY W/ POLYPECTOMY  08/2007  . DILATION AND CURETTAGE OF UTERUS  2002  . MOHS SURGERY  2010  . PORTACATH PLACEMENT Right 07/26/2016   Procedure: INSERTION PORT-A-CATH;  Surgeon: Dorris Fetch  Barry Dienes, MD;  Location: [place];  Service: General;  Laterality: Right;    FAMILY HISTORY Family History  Problem  Relation Age of Onset  . Ovarian cancer Mother 61  . Hypertension Father   . Stroke Father   . Heart disease Father   . Breast cancer Maternal Aunt 77    recurred at 62  . Heart disease Paternal Grandmother   . Osteoporosis Sister   . Liver cancer Maternal Uncle 72  The patient's father died at age 70, with some form of skin cancer, most likely melanoma. The patient's mother died at the age of 24 with ovarian cancer, which had been diagnosed a few months prior. The patient had no brothers, 1 sister. A maternal aunt was diagnosed with breast cancer at age 27, recurrent age 73.  GYNECOLOGIC HISTORY:  No LMP recorded. Patient is postmenopausal. Menarche age 32, the patient is GX P0. She stopped having periods approximately age 58. 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 Her husband [name] worked as a Dance movement psychotherapist for a [employer]. Their last name is pronounced foh-TEE-ah and they tell me it means "burning" in [place]. Their children are adopted. Jamie [name] lives in [place] and works in Press photographer, and Jamie [name] lives in [place] and is an Scientist, water quality. The patient has 3 grandchildren. She is not a Ambulance person.    ADVANCED DIRECTIVES: In place   HEALTH MAINTENANCE: Social History  Substance Use Topics  . Smoking status: Never Smoker  . Smokeless tobacco: Never Used  . Alcohol use 0.0 oz/week     Comment: 1-2     Colonoscopy:2009  JKD:TOIZTI 2018  Bone density:April 2016   Allergies  Allergen Reactions  . Adhesive [Tape]   . Codeine Other (See Comments)    Dizzy, nausea  . Sulfamethoxazole Other (See Comments)    Sick stomach    Current Outpatient Prescriptions  Medication Sig Dispense Refill  . CALCIUM PO Take by mouth 2 (two) times daily. Skeletal strength 325m Calium    . cetirizine (ZYRTEC) 10 MG tablet Take 10 mg by mouth daily.    . Cholecalciferol (VITAMIN D3) 1000 units CAPS Take 1  capsule by mouth daily.    . Cyanocobalamin (B-12) 2500 MCG TABS Take by mouth.    . Lactobacillus (ACIDOPHILUS PROBIOTIC PO) Take by mouth daily.    .Marland KitchenoxyCODONE (OXY IR/ROXICODONE) 5 MG immediate release tablet Take 1-2 tablets (5-10 mg total) by mouth every 6 (six) hours as needed for moderate pain, severe pain or breakthrough pain. 20 tablet 0  . UNABLE TO FIND 2 (two) times daily. Protandim NRF2 Synergizer     No current facility-administered medications for this visit.     OBJECTIVE: Middle-aged white woman who appears Stated age V4   08/03/16 1609  BP: 122/71  Pulse: 81  Resp: 18  Temp: 98.9 F (37.2 C)     Body mass index is 24.18 kg/m.    ECOG FS:1 - Symptomatic but completely ambulatory  Sclerae unicteric, pupils round and equal Oropharynx clear and moist-- no thrush or other lesions No cervical or supraclavicular adenopathy Lungs no rales or rhonchi Heart regular rate and rhythm Abd soft, nontender, positive bowel sounds MSK no focal spinal tenderness, no upper extremity lymphedema Neuro: nonfocal, well oriented, appropriate affect Breasts: The right breast is benign. The left breast is status post recent lumpectomy. The incisions are healing very nicely.  There is a minimal bruise. The cosmetic result is excellent. Both axillae are benign Skin: The port is in the right upper anterior chest, its incision healing nicely. It is easy to palpate  LAB RESULTS:  CMP     Component Value Date/Time   NA 141 07/11/2016 0821   K 3.8 07/11/2016 0821   CL 100 08/04/2015 0831   CO2 29 07/11/2016 0821   GLUCOSE 69 (L) 07/11/2016 0821   BUN 9.2 07/11/2016 0821   CREATININE 0.8 07/11/2016 0821   CALCIUM 9.7 07/11/2016 0821   PROT 7.1 07/11/2016 0821   ALBUMIN 4.0 07/11/2016 0821   AST 18 07/11/2016 0821   ALT 17 07/11/2016 0821   ALKPHOS 59 07/11/2016 0821   BILITOT 0.41 07/11/2016 0821   GFRNONAA 86 08/04/2015 0831   GFRAA >89 08/04/2015 0831    INo results found  for: SPEP, UPEP  Lab Results  Component Value Date   WBC 6.1 07/11/2016   NEUTROABS 3.2 07/11/2016   HGB 13.6 07/11/2016   HCT 39.3 07/11/2016   MCV 91.0 07/11/2016   PLT 347 07/11/2016      Chemistry      Component Value Date/Time   NA 141 07/11/2016 0821   K 3.8 07/11/2016 0821   CL 100 08/04/2015 0831   CO2 29 07/11/2016 0821   BUN 9.2 07/11/2016 0821   CREATININE 0.8 07/11/2016 0821   GLU 73 08/02/2014      Component Value Date/Time   CALCIUM 9.7 07/11/2016 0821   ALKPHOS 59 07/11/2016 0821   AST 18 07/11/2016 0821   ALT 17 07/11/2016 0821   BILITOT 0.41 07/11/2016 0821       No results found for: LABCA2  No components found for: LABCA125  No results for input(s): INR in the last 168 hours.  Urinalysis No results found for: COLORURINE, APPEARANCEUR, LABSPEC, PHURINE, GLUCOSEU, HGBUR, BILIRUBINUR, KETONESUR, PROTEINUR, UROBILINOGEN, NITRITE, LEUKOCYTESUR   STUDIES: Mr Breast Bilateral W Wo Contrast  Result Date: 07/12/2016 CLINICAL DATA:  Recent diagnosis of left breast cancer LABS:  Does not apply EXAM: BILATERAL BREAST MRI WITH AND WITHOUT CONTRAST TECHNIQUE: Multiplanar, multisequence MR images of both breasts were obtained prior to and following the intravenous administration of 15 ml of MultiHance. THREE-DIMENSIONAL MR IMAGE RENDERING ON INDEPENDENT WORKSTATION: Three-dimensional MR images were rendered by post-processing of the original MR data on an independent workstation. The three-dimensional MR images were interpreted, and findings are reported in the following complete MRI report for this study. Three dimensional images were evaluated at the independent DynaCad workstation COMPARISON:  Previous exam(s). FINDINGS: Breast composition: c. Heterogeneous fibroglandular tissue. Background parenchymal enhancement: Mild Right breast: No mass or abnormal enhancement. Left breast: There is spiculated enhancing mass with plateau enhancement kinetics in the posterior  depth upper-outer quadrant left breast measuring 1.5 x 2.4 x 1.6 cm with associated clip artifact consistent with biopsy proven cancer. Lymph nodes: No abnormal appearing lymph nodes. Ancillary findings:  None. IMPRESSION: Known left breast cancer. RECOMMENDATION: Treatment plan. BI-RADS CATEGORY  6: Known biopsy-proven malignancy. Electronically Signed   By: Abelardo Diesel M.D.   On: 07/12/2016 15:40   Mm Breast Surgical Specimen  Result Date: 07/26/2016 CLINICAL DATA:  Evaluate specimen EXAM: SPECIMEN RADIOGRAPH OF THE LEFT BREAST COMPARISON:  Previous exam(s). FINDINGS: Status post excision of the left breast. The radioactive seed and biopsy marker clip are present, completely intact, and were marked for pathology. IMPRESSION: Specimen radiograph of the left breast. Electronically Signed   By: Dorise Bullion  III M.D   On: 07/26/2016 15:10   Dg Chest Port 1 View  Result Date: 07/26/2016 CLINICAL DATA:  Port-A-Cath placement EXAM: PORTABLE CHEST 1 VIEW COMPARISON:  06/13/2016 . FINDINGS: A right subclavian vein Port-A-Cath has been placed. Tip is at the cavoatrial junction. There is no pneumothorax. Upper normal heart size. Clear lungs. Postop changes in the left breast. IMPRESSION: Right subclavian vein Port-A-Cath placement with its tip at the cavoatrial junction and no pneumothorax. Electronically Signed   By: Marybelle Killings M.D.   On: 07/26/2016 16:21   Dg Fluoro Guide Cv Line-no Report  Result Date: 07/26/2016 Fluoroscopy was utilized by the requesting physician.  No radiographic interpretation.   Mm Lt Radioactive Seed Loc Mammo Guide  Result Date: 07/24/2016 CLINICAL DATA:  Patient for preoperative localization prior to left breast lumpectomy. EXAM: MAMMOGRAPHIC GUIDED RADIOACTIVE SEED LOCALIZATION OF THE LEFT BREAST COMPARISON:  Previous exam(s). FINDINGS: Patient presents for radioactive seed localization prior to left breast lumpectomy. I met with the patient and we discussed the procedure of  seed localization including benefits and alternatives. We discussed the high likelihood of a successful procedure. We discussed the risks of the procedure including infection, bleeding, tissue injury and further surgery. We discussed the low dose of radioactivity involved in the procedure. Informed, written consent was given. The usual time-out protocol was performed immediately prior to the procedure. Using mammographic guidance, sterile technique, 1% lidocaine and an I-125 radioactive seed, biopsy marking clip within the upper-outer left breast was localized using a lateral approach. The follow-up mammogram images confirm the seed in the expected location and were marked for Dr. Barry Dienes. Follow-up survey of the patient confirms presence of the radioactive seed. Order number of I-125 seed:  [id number]. Total activity:  4.193 millicuries  Reference Date: 07/10/2016 The patient tolerated the procedure well and was released from the [place]. She was given instructions regarding seed removal. IMPRESSION: Radioactive seed localization left breast. No apparent complications. Electronically Signed   By: Lovey Newcomer M.D.   On: 07/24/2016 12:40    ELIGIBLE FOR AVAILABLE RESEARCH PROTOCOL: no  ASSESSMENT: 61 y.o. Jamie [name], [name] woman status post biopsy of the left breast upper outer quadrant lesion 07/03/2016 showing a clinical T1b N 0, stage 1B invasive ductal carcinoma, grade 2 or 3, estrogen receptor weakly positive at 5%, progesterone receptor negative, but HER-2 strongly amplified, with an MIB-1 of 50%.  (1) status post left lumpectomy with sentinel lymph node sampling 07/26/2016 for a pT1c pN0, stage IA invasive ductal carcinoma, grade 3, with extracellular mucin, and negative margins  (2) chemotherapy will consist of Abraxane weekly 12 starting 09/18/2016, when the patient returns from her cruise   (3) trastuzumab to  started 08/07/2016, to continue for one year   (a) baseline echocardiogram  07/25/2016 shows an ejection fraction of 60-65%  (4) adjuvant radiation to follow chemotherapy  (5) anti-estrogens to follow at the completion of radiation  (6) genetics testing pending   PLAN: I spent approximately 45 minutes today with Jamie [name] and her husband going over her situation. She had many questions regarding supplements, diets, alcohol, exercise, and protection from infection. These were all addressed.   Jamie [name] did well with her surgery, and now is prepared for chemotherapy with a port and an excellent ejection fraction on her echocardiogram.  Today we discussed her anti-HER-2 immunotherapy, which will begin next week. She understands the possible toxicities side effects and complications of these agents. In particular she understands that it will not lower her immunity  and therefore she may get her teeth cleaned March 29 as planned.  They are planning a long anticipated cruise leaving April 18 and returning 09/17/2016. She will start her chemotherapy right after that. I have gone ahead and entered per orders as well as the orders for her supportive medications. In particular she already has the EMLA cream so she can use it this coming week.   I reassured her that she will get used to her port and that it is not dangerous to move her neck. She may swim, platelet tenderness, or golf if she wishes. She does do vigorous exercising and I have encouraged her to get back into her usual regimen although starting a little bit more gently.  I will see her again with her second Herceptin cycle just to make sure there are no problems and to prepare her for the upcoming Abraxane treatments. Today I also wrote her prescription for additional breast on for cranial per cc.  She knows to call for any problems that may develop before her return visit.   [name], MD   08/03/2016 4:16 PM Medical Oncology and Hematology [facility] [address] [place], [place]  [address] Tel. [phone]    Fax. [phone]

## 2016-08-03 NOTE — Telephone Encounter (Signed)
Gave patient  avs report and appointments for March and April. Labs on 4/10 will be after 830 am f/u due to lab meeting. Also per 3/16 los lab before and after herceptin 4/10.

## 2016-08-06 ENCOUNTER — Other Ambulatory Visit: Payer: Self-pay | Admitting: Oncology

## 2016-08-06 ENCOUNTER — Other Ambulatory Visit: Payer: Self-pay | Admitting: Adult Health

## 2016-08-06 DIAGNOSIS — Z170 Estrogen receptor positive status [ER+]: Principal | ICD-10-CM

## 2016-08-06 DIAGNOSIS — C50412 Malignant neoplasm of upper-outer quadrant of left female breast: Secondary | ICD-10-CM

## 2016-08-07 ENCOUNTER — Other Ambulatory Visit (HOSPITAL_BASED_OUTPATIENT_CLINIC_OR_DEPARTMENT_OTHER): Payer: Managed Care, Other (non HMO)

## 2016-08-07 ENCOUNTER — Other Ambulatory Visit: Payer: Self-pay | Admitting: Oncology

## 2016-08-07 ENCOUNTER — Encounter: Payer: Self-pay | Admitting: *Deleted

## 2016-08-07 ENCOUNTER — Ambulatory Visit (HOSPITAL_BASED_OUTPATIENT_CLINIC_OR_DEPARTMENT_OTHER): Payer: Managed Care, Other (non HMO)

## 2016-08-07 VITALS — BP 110/65 | HR 70 | Temp 98.5°F

## 2016-08-07 DIAGNOSIS — C50412 Malignant neoplasm of upper-outer quadrant of left female breast: Secondary | ICD-10-CM

## 2016-08-07 DIAGNOSIS — Z5112 Encounter for antineoplastic immunotherapy: Secondary | ICD-10-CM

## 2016-08-07 DIAGNOSIS — Z170 Estrogen receptor positive status [ER+]: Principal | ICD-10-CM

## 2016-08-07 LAB — COMPREHENSIVE METABOLIC PANEL
ALT: 19 U/L (ref 0–55)
AST: 18 U/L (ref 5–34)
Albumin: 4 g/dL (ref 3.5–5.0)
Alkaline Phosphatase: 57 U/L (ref 40–150)
Anion Gap: 9 mEq/L (ref 3–11)
BUN: 11.8 mg/dL (ref 7.0–26.0)
CO2: 27 mEq/L (ref 22–29)
Calcium: 9.8 mg/dL (ref 8.4–10.4)
Chloride: 104 mEq/L (ref 98–109)
Creatinine: 0.8 mg/dL (ref 0.6–1.1)
EGFR: 77 mL/min/{1.73_m2} — ABNORMAL LOW (ref >90)
Glucose: 67 mg/dl — ABNORMAL LOW (ref 70–140)
Potassium: 4.4 mEq/L (ref 3.5–5.1)
Sodium: 141 mEq/L (ref 136–145)
Total Bilirubin: 0.4 mg/dL (ref 0.20–1.20)
Total Protein: 7 g/dL (ref 6.4–8.3)

## 2016-08-07 LAB — CBC WITH DIFFERENTIAL/PLATELET
BASO%: 0.9 % (ref 0.0–2.0)
Basophils Absolute: 0.1 10*3/uL (ref 0.0–0.1)
EOS%: 4.2 % (ref 0.0–7.0)
Eosinophils Absolute: 0.3 10*3/uL (ref 0.0–0.5)
HEMATOCRIT: 38.2 % (ref 34.8–46.6)
HEMOGLOBIN: 13.1 g/dL (ref 11.6–15.9)
LYMPH#: 2 10*3/uL (ref 0.9–3.3)
LYMPH%: 29.8 % (ref 14.0–49.7)
MCH: 31.1 pg (ref 25.1–34.0)
MCHC: 34.3 g/dL (ref 31.5–36.0)
MCV: 90.8 fL (ref 79.5–101.0)
MONO#: 0.6 10*3/uL (ref 0.1–0.9)
MONO%: 9.2 % (ref 0.0–14.0)
NEUT%: 55.9 % (ref 38.4–76.8)
NEUTROS ABS: 3.7 10*3/uL (ref 1.5–6.5)
PLATELETS: 340 10*3/uL (ref 145–400)
RBC: 4.2 10*6/uL (ref 3.70–5.45)
RDW: 13.5 % (ref 11.2–14.5)
WBC: 6.7 10*3/uL (ref 3.9–10.3)

## 2016-08-07 MED ORDER — ACETAMINOPHEN 325 MG PO TABS
ORAL_TABLET | ORAL | Status: AC
Start: 2016-08-07 — End: 2016-08-07
  Filled 2016-08-07: qty 2

## 2016-08-07 MED ORDER — TRASTUZUMAB CHEMO 150 MG IV SOLR
8.0000 mg/kg | Freq: Once | INTRAVENOUS | Status: AC
  Administered 2016-08-07: 546 mg via INTRAVENOUS
  Filled 2016-08-07: qty 26

## 2016-08-07 MED ORDER — SODIUM CHLORIDE 0.9% FLUSH
10.0000 mL | INTRAVENOUS | Status: DC | PRN
  Administered 2016-08-07: 10 mL
  Filled 2016-08-07: qty 10

## 2016-08-07 MED ORDER — DIPHENHYDRAMINE HCL 25 MG PO CAPS
25.0000 mg | ORAL_CAPSULE | Freq: Once | ORAL | Status: AC
  Administered 2016-08-07: 25 mg via ORAL

## 2016-08-07 MED ORDER — ACETAMINOPHEN 325 MG PO TABS
650.0000 mg | ORAL_TABLET | Freq: Once | ORAL | Status: AC
  Administered 2016-08-07: 650 mg via ORAL

## 2016-08-07 MED ORDER — HEPARIN SOD (PORK) LOCK FLUSH 100 UNIT/ML IV SOLN
500.0000 [IU] | Freq: Once | INTRAVENOUS | Status: AC | PRN
  Administered 2016-08-07: 500 [IU]
  Filled 2016-08-07: qty 5

## 2016-08-07 MED ORDER — DIPHENHYDRAMINE HCL 25 MG PO CAPS
ORAL_CAPSULE | ORAL | Status: AC
  Filled 2016-08-07: qty 1

## 2016-08-07 MED ORDER — SODIUM CHLORIDE 0.9 % IV SOLN
Freq: Once | INTRAVENOUS | Status: AC
  Administered 2016-08-07: 10:00:00 via INTRAVENOUS

## 2016-08-07 NOTE — Progress Notes (Signed)
DISCONTINUE ON PATHWAY REGIMEN - Breast   [id number]:Nab-Paclitaxel (Abraxane(R)) 100 mg/m2 D1,8,15 q28 days:   A cycle is every 28 days (3 weeks on and 1 week off):     Nab-paclitaxel (protein bound)        Dose Mod: None  **Always confirm dose/schedule in your pharmacy ordering system**    [id number]:Trastuzumab (Maintenance - w/Loading Dose) x 11 Cycles:   A cycle is every 21 days:     Trastuzumab        Dose Mod: None     Trastuzumab        Dose Mod: None  **Always confirm dose/schedule in your pharmacy ordering system**    REASON: Other Reason PRIOR TREATMENT: Off Pathway: Nab-Paclitaxel (Abraxane(R)) 100 mg/m2 D1,8,15 q28 days followed by Trastuzumab (Maintenance - w/Loading Dose) x 11 Cycles TREATMENT RESPONSE: Unable to Evaluate  START ON PATHWAY REGIMEN - Breast   Paclitaxel Weekly + Trastuzumab Weekly:   Administer weekly:     Paclitaxel      Trastuzumab      Trastuzumab   **Always confirm dose/schedule in your pharmacy ordering system**    Trastuzumab (Maintenance - NO Loading Dose):   A cycle is every 21 days:     Trastuzumab   **Always confirm dose/schedule in your pharmacy ordering system**    Patient Characteristics: Adjuvant Therapy, Node Negative, HER2/neu Positive, ER Positive, Stage Ia and Ib, T1b, Chemotherapy Indicated AJCC Stage Grouping: IA Current Disease Status: No Distant Mets or Local Recurrence AJCC M Stage: 0 ER Status: Positive (+) AJCC N Stage: 0 AJCC T Stage: 1b HER2/neu: Positive (+) PR Status: Negative (-) Node Status: Negative (-) Intervention Indicated: Chemotherapy  Intent of Therapy: Curative Intent, Discussed with Patient

## 2016-08-07 NOTE — Patient Instructions (Signed)
[place] [facility] Discharge Instructions for Patients Receiving Chemotherapy  Today you received the following chemotherapy agents herceptin  To help prevent nausea and vomiting after your treatment, we encourage you to take your nausea medication as prescribed.   If you develop nausea and vomiting that is not controlled by your nausea medication, call the clinic.   BELOW ARE SYMPTOMS THAT SHOULD BE REPORTED IMMEDIATELY:  *FEVER GREATER THAN 100.5 F  *CHILLS WITH OR WITHOUT FEVER  NAUSEA AND VOMITING THAT IS NOT CONTROLLED WITH YOUR NAUSEA MEDICATION  *UNUSUAL SHORTNESS OF BREATH  *UNUSUAL BRUISING OR BLEEDING  TENDERNESS IN MOUTH AND THROAT WITH OR WITHOUT PRESENCE OF ULCERS  *URINARY PROBLEMS  *BOWEL PROBLEMS  UNUSUAL RASH Items with * indicate a potential emergency and should be followed up as soon as possible.  Feel free to call the clinic you have any questions or concerns. The clinic phone number is [phone].  Please show the [facility] at check-in to the Emergency Department and triage nurse.  Trastuzumab injection for infusion What is this medicine? TRASTUZUMAB (tras TOO zoo mab) is a monoclonal antibody. It is used to treat breast cancer and stomach cancer. This medicine may be used for other purposes; ask your health care provider or pharmacist if you have questions. COMMON BRAND NAME(S): Herceptin What should I tell my health care provider before I take this medicine? They need to know if you have any of these conditions: -heart disease -heart failure -lung or breathing disease, like asthma -an unusual or allergic reaction to trastuzumab, benzyl alcohol, or other medications, foods, dyes, or preservatives -pregnant or trying to get pregnant -breast-feeding How should I use this medicine? This drug is given as an infusion into a vein. It is administered in a hospital or clinic by a specially trained health care professional. Talk to your  pediatrician regarding the use of this medicine in children. This medicine is not approved for use in children. Overdosage: If you think you have taken too much of this medicine contact a poison control center or emergency room at once. NOTE: This medicine is only for you. Do not share this medicine with others. What if I miss a dose? It is important not to miss a dose. Call your doctor or health care professional if you are unable to keep an appointment. What may interact with this medicine? This medicine may interact with the following medications: -certain types of chemotherapy, such as daunorubicin, doxorubicin, epirubicin, and idarubicin This list may not describe all possible interactions. Give your health care provider a list of all the medicines, herbs, non-prescription drugs, or dietary supplements you use. Also tell them if you smoke, drink alcohol, or use illegal drugs. Some items may interact with your medicine. What should I watch for while using this medicine? Visit your doctor for checks on your progress. Report any side effects. Continue your course of treatment even though you feel ill unless your doctor tells you to stop. Call your doctor or health care professional for advice if you get a fever, chills or sore throat, or other symptoms of a cold or flu. Do not treat yourself. Try to avoid being around people who are sick. You may experience fever, chills and shaking during your first infusion. These effects are usually mild and can be treated with other medicines. Report any side effects during the infusion to your health care professional. Fever and chills usually do not happen with later infusions. Do not become pregnant while taking  this medicine or for 7 months after stopping it. Women should inform their doctor if they wish to become pregnant or think they might be pregnant. Women of child-bearing potential will need to have a negative pregnancy test before starting this  medicine. There is a potential for serious side effects to an unborn child. Talk to your health care professional or pharmacist for more information. Do not breast-feed an infant while taking this medicine or for 7 months after stopping it. Women must use effective birth control with this medicine. What side effects may I notice from receiving this medicine? Side effects that you should report to your doctor or health care professional as soon as possible: -allergic reactions like skin rash, itching or hives, swelling of the face, lips, or tongue -chest pain or palpitations -cough -dizziness -feeling faint or lightheaded, falls -fever -general ill feeling or flu-like symptoms -signs of worsening heart failure like breathing problems; swelling in your legs and feet -unusually weak or tired Side effects that usually do not require medical attention (report to your doctor or health care professional if they continue or are bothersome): -bone pain -changes in taste -diarrhea -joint pain -nausea/vomiting -weight loss This list may not describe all possible side effects. Call your doctor for medical advice about side effects. You may report side effects to FDA at 1-800-FDA-1088. Where should I keep my medicine? This drug is given in a hospital or clinic and will not be stored at home. NOTE: This sheet is a summary. It may not cover all possible information. If you have questions about this medicine, talk to your doctor, pharmacist, or health care provider.  2018 Elsevier/Gold Standard (2016-05-01 14:37:52)

## 2016-08-08 ENCOUNTER — Telehealth: Payer: Self-pay

## 2016-08-08 ENCOUNTER — Ambulatory Visit: Payer: 59

## 2016-08-08 ENCOUNTER — Other Ambulatory Visit: Payer: 59

## 2016-08-08 NOTE — Telephone Encounter (Signed)
Chemo f/u "I'm doing great". She exercised today and going out now. Much relieved first herceptin is done.

## 2016-08-15 ENCOUNTER — Ambulatory Visit: Payer: 59

## 2016-08-15 ENCOUNTER — Other Ambulatory Visit: Payer: 59

## 2016-08-15 DIAGNOSIS — Z1379 Encounter for other screening for genetic and chromosomal anomalies: Secondary | ICD-10-CM

## 2016-08-15 HISTORY — DX: Encounter for other screening for genetic and chromosomal anomalies: Z13.79

## 2016-08-17 ENCOUNTER — Encounter: Payer: Self-pay | Admitting: Genetics

## 2016-08-17 ENCOUNTER — Telehealth: Payer: Self-pay | Admitting: Genetics

## 2016-08-17 ENCOUNTER — Ambulatory Visit: Payer: Self-pay | Admitting: Genetics

## 2016-08-17 DIAGNOSIS — Z1379 Encounter for other screening for genetic and chromosomal anomalies: Secondary | ICD-10-CM

## 2016-08-17 NOTE — Progress Notes (Signed)
HPI: Jamie [name] was previously seen in the [place] clinic due to a personal and family history of cancer and concerns regarding a hereditary predisposition to cancer. Please refer to our prior cancer genetics clinic note for more information regarding Jamie [name]'s medical, social and family histories, and our assessment and recommendations, at the time. Jamie [name] recent genetic test results were disclosed to her, as were recommendations warranted by these results. These results and recommendations are discussed in more detail below.  CANCER HISTORY:   No history exists.    FAMILY HISTORY:  We obtained a detailed, 4-generation family history.  Significant diagnoses are listed below: Family History  Problem Relation Age of Onset  . Ovarian cancer Mother 53  . Hypertension Father   . Stroke Father   . Heart disease Father   . Breast cancer Maternal Aunt 77    recurred at 75  . Heart disease Paternal Grandmother   . Osteoporosis Sister   . 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 We reviewed recessive inheritance and discussed when an individual has two MYH gene mutations, this is associated with an increased risk for adenomatous (precancerous) colon polyps. Recently, the [employer]  (NCCN: V.3.2017) provided national guidelines to manage the colon cancer risk found with single (heterozygous) MUTYH mutation.  These guidelines include having a MUTYH pathogenic mutation and:  Personal history of colon cancer  Follow instructions provided by your physician based on your personal history.  Do not have a personal history of colon cancer but have a parent/sibling/child with colon cancer: Colonoscopy every 5 years starting at age 65 or 58 years  younger than the earliest age of onset, whichever is younger.  Do not have a personal history of colon cancer and do not have a parent/sibling/child with colon cancer: Data is uncertain whether specialized screening is needed.  I discussed with Jamie [name] that because she has no family history of colon cancer, her risks and screening recommendations are unclear. I recommended that she consult her gastroenterologist regarding whether her single MUTYH mutation warrants more frequent colonoscopies. [name] expressed agreement with this plan.   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 At this time, it is unknown if this variant is associated with increased cancer risk or if this is a normal finding, but most variants such as this get reclassified to being inconsequential. It should not be used to make medical management decisions. With time, we suspect the lab will determine the significance of this variant, if any. If we do learn more about it, we will try to contact Jamie [name] to discuss it further. However, it is important to stay in touch with [place] periodically and keep the address and phone number up to date.  The test report will be scanned into EPIC and will be located under the Molecular Pathology section of the Results Review tab.A portion of the result report is included below for reference.      CANCER SCREENING RECOMMENDATIONS: Because Jamie [name]'s genetic testing did not reveal any mutations associated with breast or ovarian cancer, her personal and family history of cancers remain unexplained. We discussed with Jamie [name] that since the current genetic testing is not perfect, it is possible there may be a gene  mutation in one of these genes that current testing cannot detect, but that chance is small. We also discussed, that it is possible that another gene that has not yet been discovered, or that we have not yet tested, is responsible  for the cancer diagnoses in the family, and it is, therefore, important to remain in touch with cancer genetics in the future so that we can continue to offer [name] the most up to date genetic testing. Because Jamie [name]'s genetic testing did not reveal a mutation in a breast-risk gene, I have no reason to suspect that her risk for a second breast cancer is increased. Her breast cancer treatments and surveillance should be based on other aspects of her diagnosis rather than her genetic testing results.  [name] will consult her gastroenterologist regarding frequency of colonoscopies based on a single MUTYH mutation. Due to her personal history of skin cancers, she should continue close management by a dermatologist. At this time, there is no recommended screening for individuals with a family history of ovarian cancer. If Jamie [name] would like to consider screenings for ovarian cancer, she should consult her referring physician regarding her options.  RECOMMENDATIONS FOR FAMILY MEMBERS: Women in this family might be at some increased risk of developing cancer, over the general population risk, simply due to the family history of cancer. We recommended women in this family have a yearly mammogram beginning at age 33, or 82 years younger than the earliest onset of cancer, an annual clinical breast exam, and perform monthly breast self-exams. Women in this family should also have a gynecological exam as recommended by their primary provider. All family members should have a colonoscopy by age 51.  FOLLOW-UP: Lastly, we discussed with Jamie [name] that cancer genetics is a rapidly advancing field and it is possible that new genetic tests will be appropriate for her and/or her family members in the future. We encouraged her to remain in contact with cancer genetics on an annual basis so we can update her personal and family histories and let her know of advances in cancer genetics that may benefit this family.     Our contact number was provided. Jamie [name] questions were answered to her satisfaction, and she knows she is welcome to call us at anytime with additional questions or concerns.   Mal Misty, MS, [facility] Certified Naval [email or web address] [phone]

## 2016-08-17 NOTE — Telephone Encounter (Signed)
Reviewed genetic testing results: positive for a single mutation in MUTYH and a variant of uncertain signficance in SDHB. For more detailed discussion, please see my note from 08/17/2016.

## 2016-08-22 ENCOUNTER — Ambulatory Visit: Payer: 59

## 2016-08-22 ENCOUNTER — Other Ambulatory Visit: Payer: 59

## 2016-08-28 ENCOUNTER — Telehealth: Payer: Self-pay | Admitting: *Deleted

## 2016-08-28 ENCOUNTER — Ambulatory Visit (HOSPITAL_BASED_OUTPATIENT_CLINIC_OR_DEPARTMENT_OTHER): Payer: Managed Care, Other (non HMO)

## 2016-08-28 ENCOUNTER — Ambulatory Visit (HOSPITAL_BASED_OUTPATIENT_CLINIC_OR_DEPARTMENT_OTHER): Payer: Managed Care, Other (non HMO) | Admitting: Oncology

## 2016-08-28 ENCOUNTER — Other Ambulatory Visit: Payer: Managed Care, Other (non HMO)

## 2016-08-28 ENCOUNTER — Other Ambulatory Visit (HOSPITAL_BASED_OUTPATIENT_CLINIC_OR_DEPARTMENT_OTHER): Payer: Managed Care, Other (non HMO)

## 2016-08-28 VITALS — BP 121/76 | HR 73 | Temp 97.9°F | Resp 18 | Ht 66.0 in | Wt 150.5 lb

## 2016-08-28 DIAGNOSIS — C50412 Malignant neoplasm of upper-outer quadrant of left female breast: Secondary | ICD-10-CM

## 2016-08-28 DIAGNOSIS — Z5112 Encounter for antineoplastic immunotherapy: Secondary | ICD-10-CM

## 2016-08-28 DIAGNOSIS — Z170 Estrogen receptor positive status [ER+]: Secondary | ICD-10-CM | POA: Diagnosis not present

## 2016-08-28 LAB — CBC WITH DIFFERENTIAL/PLATELET
BASO%: 0.8 % (ref 0.0–2.0)
Basophils Absolute: 0.1 10*3/uL (ref 0.0–0.1)
EOS%: 3.5 % (ref 0.0–7.0)
Eosinophils Absolute: 0.2 10*3/uL (ref 0.0–0.5)
HCT: 38.1 % (ref 34.8–46.6)
HGB: 13.1 g/dL (ref 11.6–15.9)
LYMPH%: 29 % (ref 14.0–49.7)
MCH: 31 pg (ref 25.1–34.0)
MCHC: 34.3 g/dL (ref 31.5–36.0)
MCV: 90.5 fL (ref 79.5–101.0)
MONO#: 0.6 10*3/uL (ref 0.1–0.9)
MONO%: 9.1 % (ref 0.0–14.0)
NEUT#: 3.9 10*3/uL (ref 1.5–6.5)
NEUT%: 57.6 % (ref 38.4–76.8)
Platelets: 305 10*3/uL (ref 145–400)
RBC: 4.21 10*6/uL (ref 3.70–5.45)
RDW: 13 % (ref 11.2–14.5)
WBC: 6.8 10*3/uL (ref 3.9–10.3)
lymph#: 2 10*3/uL (ref 0.9–3.3)

## 2016-08-28 LAB — COMPREHENSIVE METABOLIC PANEL
ALT: 15 U/L (ref 0–55)
AST: 20 U/L (ref 5–34)
Albumin: 4.1 g/dL (ref 3.5–5.0)
Alkaline Phosphatase: 58 U/L (ref 40–150)
Anion Gap: 9 mEq/L (ref 3–11)
BUN: 13.2 mg/dL (ref 7.0–26.0)
CHLORIDE: 105 meq/L (ref 98–109)
CO2: 25 mEq/L (ref 22–29)
CREATININE: 0.8 mg/dL (ref 0.6–1.1)
Calcium: 9.9 mg/dL (ref 8.4–10.4)
EGFR: 80 mL/min/{1.73_m2} — ABNORMAL LOW (ref >90)
Glucose: 89 mg/dl (ref 70–140)
Potassium: 4 mEq/L (ref 3.5–5.1)
Sodium: 139 mEq/L (ref 136–145)
Total Bilirubin: 0.42 mg/dL (ref 0.20–1.20)
Total Protein: 7.1 g/dL (ref 6.4–8.3)

## 2016-08-28 MED ORDER — HEPARIN SOD (PORK) LOCK FLUSH 100 UNIT/ML IV SOLN
500.0000 [IU] | Freq: Once | INTRAVENOUS | Status: AC | PRN
  Administered 2016-08-28: 500 [IU]
  Filled 2016-08-28: qty 5

## 2016-08-28 MED ORDER — SODIUM CHLORIDE 0.9 % IV SOLN
6.0000 mg/kg | Freq: Once | INTRAVENOUS | Status: AC
  Administered 2016-08-28: 399 mg via INTRAVENOUS
  Filled 2016-08-28: qty 19

## 2016-08-28 MED ORDER — DIPHENHYDRAMINE HCL 25 MG PO CAPS
ORAL_CAPSULE | ORAL | Status: AC
  Filled 2016-08-28: qty 1

## 2016-08-28 MED ORDER — DIPHENHYDRAMINE HCL 25 MG PO CAPS
25.0000 mg | ORAL_CAPSULE | Freq: Once | ORAL | Status: AC
  Administered 2016-08-28: 25 mg via ORAL

## 2016-08-28 MED ORDER — ACETAMINOPHEN 325 MG PO TABS
ORAL_TABLET | ORAL | Status: AC
  Filled 2016-08-28: qty 2

## 2016-08-28 MED ORDER — SODIUM CHLORIDE 0.9 % IV SOLN
Freq: Once | INTRAVENOUS | Status: AC
  Administered 2016-08-28: 10:00:00 via INTRAVENOUS

## 2016-08-28 MED ORDER — ACETAMINOPHEN 325 MG PO TABS
650.0000 mg | ORAL_TABLET | Freq: Once | ORAL | Status: AC
  Administered 2016-08-28: 650 mg via ORAL

## 2016-08-28 MED ORDER — SODIUM CHLORIDE 0.9% FLUSH
10.0000 mL | INTRAVENOUS | Status: DC | PRN
  Administered 2016-08-28: 10 mL
  Filled 2016-08-28: qty 10

## 2016-08-28 NOTE — Progress Notes (Signed)
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 This showed a possible mass in the left breast. On 07/02/2016 she underwent left diagnostic mammography with tomography and left breast ultrasonography. The breast density was category C. In the left breast upper outer quadrant there was a microlobulated mass which was not directly palpable, although there was slight thickening in the left breast 1:00 position. Targeted ultrasonography confirmed a solid hypoechoic microlobulated mass in the left breast 1:00 radiant 4 cm from the nipple, measuring 0.8 cm.  On 07/03/2016 [name] underwent biopsy of the left breast mass in question, and this showed ([id number]) invasive ductal carcinoma, grade 2 or 3, with extracellular mucin, estrogen receptor 5% positive with weak staining intensity, progesterone receptor negative, with an MIB-1 of 50%, and HER-2 amplified, the signals ratio being 5.71 and the number per cell 14.55.  Her subsequent history is as detailed below  INTERVAL HISTORY:  Jamie [name] returns today for follow up of her HER 2 positive breast cancer accompanied by her sister Jamie [name]. Jamie [name] had her first Herceptin dose 3 weeks ago. She  tolerated it well. She had no reaction. She was a little sleepy from the Benadryl. 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 There was also a variant of uncertain significance noted.   She has a little bit of discomfort from her port particular since she golfs and swims which require quite a bit of neck stretching but she is "putting up with it".  REVIEW OF SYSTEMS: A detailed review of systems today was otherwise noncontributory   PAST MEDICAL HISTORY: Past Medical History:  Diagnosis Date  . Anxiety   . Arthritis    feet  . Basal cell carcinoma   . Breast cancer ([place]) 06/2016   left breast  . Eczema   . Genetic testing 08/15/2016   Jamie [name] underwent genetic testing for hereditary cancer syndrome through [facility]'s 43-gene Common Hereditary Cancers Panel. 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 Result report is dated 08/15/2016. Please see genetic counseling documentation from 08/17/2016 for further discussion.  [name] PONV (postoperative nausea and vomiting)     PAST SURGICAL HISTORY: Past Surgical History:  Procedure Laterality Date  . BONE SPUR  2001 AND 1988  . BREAST LUMPECTOMY WITH RADIOACTIVE SEED AND SENTINEL LYMPH NODE BIOPSY Left 07/26/2016   Procedure: BREAST LUMPECTOMY WITH RADIOACTIVE SEED AND SENTINEL LYMPH NODE BIOPSY;  Surgeon: [name], MD;  Location: [facility];  Service: General;  Laterality: Left;  . BUNIONECTOMY  10/13  . COLONOSCOPY W/ POLYPECTOMY  08/2007  . DILATION AND CURETTAGE OF UTERUS  2002  . MOHS  SURGERY  2010  . PORTACATH PLACEMENT Right 07/26/2016   Procedure: INSERTION PORT-A-CATH;  Surgeon: [name], MD;  Location: [place];  Service: General;   Laterality: Right;    FAMILY HISTORY Family History  Problem Relation Age of Onset  . Ovarian cancer Mother 33  . Hypertension Father   . Stroke Father   . Heart disease Father   . Breast cancer Maternal Aunt 77    recurred at 70  . Heart disease Paternal Grandmother   . Osteoporosis Sister   . Liver cancer Maternal Uncle 51  The patient's father died at age 25, with some form of skin cancer, most likely melanoma. The patient's mother died at the age of 58 with ovarian cancer, which had been diagnosed a few months prior. The patient had no brothers, 1 sister. A maternal aunt was diagnosed with breast cancer at age 38, recurrent age 75.  GYNECOLOGIC HISTORY:  No LMP recorded. Patient is postmenopausal. Menarche age 61, the patient is GX P0. She stopped having periods approximately age 61. 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 Her husband [name] worked as a Dance movement psychotherapist for a [employer]. Their last name is pronounced foh-TEE-ah and they tell me it means "burning" in [place]. Their children are adopted. Jamie [name] lives in [place] and works in Press photographer, and Jamie [name] lives in [place] and is an Scientist, water quality. The patient has 3 grandchildren. She is not a Ambulance person.    ADVANCED DIRECTIVES: In place   HEALTH MAINTENANCE: Social History  Substance Use Topics  . Smoking status: Never Smoker  . Smokeless tobacco: Never Used  . Alcohol use 0.0 oz/week     Comment: 1-2     Colonoscopy:2009  KPT:WSFKCL 2018  Bone density:April 2016   Allergies  Allergen Reactions  . Adhesive [Tape]   . Codeine Other (See Comments)    Dizzy, nausea  . Sulfamethoxazole Other (See Comments)    Sick stomach    Current Outpatient Prescriptions  Medication Sig Dispense Refill  . ondansetron (ZOFRAN) 8 MG tablet Take by mouth every 8 (eight) hours as needed.    . prochlorperazine (COMPAZINE) 10 MG tablet Take 10  mg by mouth every 6 (six) hours as needed.    [name] CALCIUM PO Take 500 mg by mouth 2 (two) times daily. Skeletal strength 319m Calium    . cetirizine (ZYRTEC) 10 MG tablet Take 10 mg by mouth daily.    . Cholecalciferol (VITAMIN D3) 1000 units CAPS Take 1 capsule by mouth daily.    . Cyanocobalamin (B-12) 2500 MCG TABS Take by mouth.    . Lactobacillus (ACIDOPHILUS PROBIOTIC PO) Take by mouth daily.    .Marland KitchenoxyCODONE (OXY IR/ROXICODONE) 5 MG immediate release tablet Take 1-2 tablets (5-10 mg total) by mouth every 6 (six) hours as needed for moderate pain, severe pain or breakthrough pain. 20 tablet 0   No current facility-administered medications for this visit.     OBJECTIVE: Middle-aged white woman in no acute distress Vitals:   08/28/16 0816  BP: 121/76  Pulse: 73  Resp: 18  Temp: 97.9 F (36.6 C)     Body mass index is 24.29 kg/m.    ECOG FS:0 - Asymptomatic  Sclerae unicteric, EOMs intact Oropharynx clear and moist No cervical or supraclavicular adenopathy Lungs no rales or rhonchi Heart regular rate and rhythm Abd soft, nontender, positive bowel sounds MSK no  focal spinal tenderness, no upper extremity lymphedema Neuro: nonfocal, well oriented, appropriate affect Breasts: The right breast is unremarkable. The left breast status post lumpectomy with an excellent cosmetic result. The incision is healing nicely. There is minimal induration subjacent to it. Both axillae are benign.  LAB RESULTS:  CMP     Component Value Date/Time   NA 141 08/07/2016 0942   K 4.4 08/07/2016 0942   CL 100 08/04/2015 0831   CO2 27 08/07/2016 0942   GLUCOSE 67 (L) 08/07/2016 0942   BUN 11.8 08/07/2016 0942   CREATININE 0.8 08/07/2016 0942   CALCIUM 9.8 08/07/2016 0942   PROT 7.0 08/07/2016 0942   ALBUMIN 4.0 08/07/2016 0942   AST 18 08/07/2016 0942   ALT 19 08/07/2016 0942   ALKPHOS 57 08/07/2016 0942   BILITOT 0.40 08/07/2016 0942   GFRNONAA 86 08/04/2015 0831   GFRAA >89 08/04/2015 0831     INo results found for: SPEP, UPEP  Lab Results  Component Value Date   WBC 6.7 08/07/2016   NEUTROABS 3.7 08/07/2016   HGB 13.1 08/07/2016   HCT 38.2 08/07/2016   MCV 90.8 08/07/2016   PLT 340 08/07/2016      Chemistry      Component Value Date/Time   NA 141 08/07/2016 0942   K 4.4 08/07/2016 0942   CL 100 08/04/2015 0831   CO2 27 08/07/2016 0942   BUN 11.8 08/07/2016 0942   CREATININE 0.8 08/07/2016 0942   GLU 73 08/02/2014      Component Value Date/Time   CALCIUM 9.8 08/07/2016 0942   ALKPHOS 57 08/07/2016 0942   AST 18 08/07/2016 0942   ALT 19 08/07/2016 0942   BILITOT 0.40 08/07/2016 0942       No results found for: LABCA2  No components found for: LABCA125  No results for input(s): INR in the last 168 hours.  Urinalysis No results found for: COLORURINE, APPEARANCEUR, LABSPEC, PHURINE, GLUCOSEU, HGBUR, BILIRUBINUR, KETONESUR, PROTEINUR, UROBILINOGEN, NITRITE, LEUKOCYTESUR   STUDIES: Echo and genetics results review  ELIGIBLE FOR AVAILABLE RESEARCH PROTOCOL: no  ASSESSMENT: 61 y.o. Jule Ser, [place] woman status post biopsy of the left breast upper outer quadrant lesion 07/03/2016 showing a clinical T1b N0, stage 1B invasive ductal carcinoma, grade 2 or 3, estrogen receptor weakly positive at 5%, progesterone receptor negative, but HER-2 strongly amplified, with an MIB-1 of 50%.  (1) status post left lumpectomy with sentinel lymph node sampling 07/26/2016 for a pT1c pN0, stage IA invasive ductal carcinoma, grade 3, with extracellular mucin, and negative margins  (2) chemotherapy will consist of Abraxane weekly 12 starting 09/18/2016, when the patient returns from her cruise   (3) trastuzumab started 08/07/2016, to continue for one year   (a) baseline echocardiogram 07/25/2016 shows an ejection fraction of 60-65%  (4) adjuvant radiation to follow chemotherapy  (5) anti-estrogens to follow at the completion of radiation  (a) will avoid tamoxifen  given the history of monoclonal allelic MUTYH mutation   (6) genetics testing 08/15/2016 through the [facility]'s 43-gene Common Hereditary Cancers Panel showed a pathogenic variant called, MUTYH, 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   (a)   PLAN: Rosmery tolerated her first Herceptin well and she is proceeding with her second cycle today. She will then go on her trip to the [place] and when she returns, on 09/18/2016 she will have her third dose of Herceptin and her first dose of Abraxane.  Today we discussed the fact that she will take Compazine on the evening of her chemotherapy, and ondansetron  the next morning. After that she will choose which one of these 2 she prefers and only take that one for nausea as in general Abraxane is very well-tolerated  We discussed her genetics results. She understands she is at slightly higher risk of colon and possibly also endometrial cancers and for that reason when we do her anti-estrogens we will avoid tamoxifen  I recommend she have a colonoscopy sometime this year, of course after she completes her chemotherapy  She has a good understanding of this plan. She agrees with it. She will call with any problems that may develop before her next visit here. [name], MD   08/28/2016 8:50 AM Medical Oncology and Hematology [facility] [address] [place], [place] [address] Tel. [phone]    Fax. [phone]   Int J Cancer. 2011 Nov 1;129(9):2256-62. doi: 10.1002/ijc.25870. Epub 2011 Apr 8.  Cancer risks for monoallelic MUTYH mutation carriers with a family history of colorectal cancer. Win Kizzie Furnish SP, [place] JG, [place], [place] JP, [facility] DD, [place] MC, [place], [place] PS, [place], Tereso Newcomer, [place], [place], [place], [place], [place].  Author information Abstract Cancer risks for a person who has inherited a MUTYH mutation from only one parent (monoallelic mutation carrier) are uncertain. Using the  Colon Cancer Family Registry and [place] Familial Colon Cancer Registry, we identified 2,179 first- and second-degree relatives of 144 incident colorectal cancer (CRC) cases who were monoallelic or biallelic mutation carriers ascertained by sampling population complete cancer registries in the [place], [place] and [place]. Using Cox regression weighted to adjust for sampling on family history, we estimated that the country-, age- and sex-specific standardized incidence ratios (SIRs) for monoallelic mutation carriers, compared to the general population, were: 2.04 (95% confidence interval, CI 1.56-2.70; p < 0.001) for CRC, 3.24 (95%CI 2.18-4.98; p < 0.001) for gastric cancer, 3.09 (95%CI 1.07-12.25; p = 0.07) for liver cancer and 2.33 (95%CI 1.18-5.08; p = 0.02) for endometrial cancer. Age-specific cumulative risks to age 26 years, estimated using the SIRs and [place] population incidences, were: for CRC, 6% (95%CI 5-8%) for men and 4% (95%CI 3-6%) for women; for gastric cancer, 2% (95%CI 1-3%) for men and 0.7% (95%CI 0.5-1%) for women; for liver cancer, 1% (95%CI 0.3-3%) for men and 0.3% (95%CI 0.1-1%) for women and for endometrial cancer, 4% (95%CI 2-8%). There was no evidence of increased risks for cancers of the brain, pancreas, kidney, lung, breast or prostate. Monoallelic MUTYH mutation carriers with a family history of CRC, such as those identified from screening multiple-case CRC families, are at increased risk of colorectal, gastric, endometrial and possibly liver cancers. PMID: [id number] PMCID: [id number] DOI: 10.1002/ijc.25870 [Indexed for Dennis  [facility] Article

## 2016-08-28 NOTE — Telephone Encounter (Signed)
This RN verified lab is not needed per MD prior to herceptin.  Labs may be drawn via port with access for chemo.  Pt only needs 1 lab draw today.

## 2016-08-28 NOTE — Patient Instructions (Signed)
[facility] Discharge Instructions for Patients Receiving Chemotherapy  Today you received the following chemotherapy agents: Herceptin   To help prevent nausea and vomiting after your treatment, we encourage you to take your nausea medication as directed.    If you develop nausea and vomiting that is not controlled by your nausea medication, call the clinic.   BELOW ARE SYMPTOMS THAT SHOULD BE REPORTED IMMEDIATELY:  *FEVER GREATER THAN 100.5 F  *CHILLS WITH OR WITHOUT FEVER  NAUSEA AND VOMITING THAT IS NOT CONTROLLED WITH YOUR NAUSEA MEDICATION  *UNUSUAL SHORTNESS OF BREATH  *UNUSUAL BRUISING OR BLEEDING  TENDERNESS IN MOUTH AND THROAT WITH OR WITHOUT PRESENCE OF ULCERS  *URINARY PROBLEMS  *BOWEL PROBLEMS  UNUSUAL RASH Items with * indicate a potential emergency and should be followed up as soon as possible.  Feel free to call the clinic you have any questions or concerns. The clinic phone number is [phone].  Please show the CHEMO ALERT CARD at check-in to the Emergency Department and triage nurse.   

## 2016-08-29 ENCOUNTER — Ambulatory Visit: Payer: 59

## 2016-08-29 ENCOUNTER — Other Ambulatory Visit: Payer: 59

## 2016-09-12 ENCOUNTER — Telehealth: Payer: Self-pay | Admitting: Oncology

## 2016-09-12 NOTE — Telephone Encounter (Signed)
Faxed to [facility] on 08/15/16 Oncology Treatment plan info requested to fax [phone] and scanned copy into epic under media tab

## 2016-09-17 ENCOUNTER — Other Ambulatory Visit: Payer: Self-pay | Admitting: Oncology

## 2016-09-17 ENCOUNTER — Other Ambulatory Visit: Payer: Self-pay | Admitting: *Deleted

## 2016-09-17 ENCOUNTER — Encounter: Payer: Self-pay | Admitting: Pharmacist

## 2016-09-17 NOTE — Progress Notes (Signed)
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 This showed a possible mass in the left breast. On 07/02/2016 she underwent left diagnostic mammography with tomography and left breast ultrasonography. The breast density was category C. In the left breast upper outer quadrant there was a microlobulated mass which was not directly palpable, although there was slight thickening in the left breast 1:00 position. Targeted ultrasonography confirmed a solid hypoechoic microlobulated mass in the left breast 1:00 radiant 4 cm from the nipple, measuring 0.8 cm.  On 07/03/2016 Jamie [name] underwent biopsy of the left breast mass in question, and this showed ([id number]) invasive ductal carcinoma, grade 2 or 3, with extracellular mucin, estrogen receptor 5% positive with weak staining intensity, progesterone receptor negative, with an MIB-1 of 50%, and HER-2 amplified, the signals ratio being 5.71 and the number per cell 14.55.  Her subsequent history is as detailed below  INTERVAL HISTORY: [name] is doing well today.  She is here to start weekly Paclitaxel and receive her every 3 week Trastuzumab.  She has picked up her anti emetics and understands how  to take them.  She denies any fevers, chills, chest pain, palpitations, shortness of breath, leg swelling, or any concerns.  She underwent echocardiogram and eval with Dr. Aundra Dubin on 3/7.  She is nervous, but otherwise feeling well and without questions or concerns.   REVIEW OF SYSTEMS: A detailed review of systems today was otherwise noncontributory   PAST MEDICAL HISTORY: Past Medical History:  Diagnosis Date  . Anxiety   . Arthritis    feet  . Basal cell carcinoma   . Breast cancer ([place]) 06/2016   left breast  . Eczema   . Genetic testing 08/15/2016   [name] underwent genetic testing for hereditary cancer syndrome through [facility]'s 43-gene Common Hereditary Cancers Panel. 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 Result report is dated 08/15/2016. Please see genetic counseling documentation from 08/17/2016 for further discussion.  [name] PONV (postoperative nausea and vomiting)     PAST SURGICAL HISTORY: Past Surgical History:  Procedure Laterality Date  . BONE SPUR  2001 AND 1988  . BREAST LUMPECTOMY WITH RADIOACTIVE SEED AND SENTINEL LYMPH NODE BIOPSY Left 07/26/2016   Procedure: BREAST LUMPECTOMY WITH RADIOACTIVE SEED AND SENTINEL LYMPH NODE BIOPSY;  Surgeon: [name], MD;  Location: [facility];  Service: General;  Laterality: Left;  . BUNIONECTOMY  10/13  . COLONOSCOPY W/ POLYPECTOMY  08/2007  . DILATION AND CURETTAGE OF UTERUS  2002  . MOHS SURGERY  2010  . PORTACATH PLACEMENT Right 07/26/2016   Procedure: INSERTION PORT-A-CATH;  Surgeon: [name], MD;  Location: [facility];  Service: General;  Laterality: Right;    FAMILY HISTORY Family History  Problem Relation Age of Onset  . Ovarian  cancer Mother 33  . Hypertension Father   . Stroke Father   . Heart disease Father   . Breast cancer Maternal Aunt 77    recurred at 99  . Heart disease Paternal  Grandmother   . Osteoporosis Sister   . Liver cancer Maternal Uncle 54  The patient's father died at age 39, with some form of skin cancer, most likely melanoma. The patient's mother died at the age of 24 with ovarian cancer, which had been diagnosed a few months prior. The patient had no brothers, 1 sister. A maternal aunt was diagnosed with breast cancer at age 89, recurrent age 70.  GYNECOLOGIC HISTORY:  No LMP recorded. Patient is postmenopausal. Menarche age 57, the patient is GX P0. She stopped having periods approximately age 89. 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 Her husband [name] worked as a Dance movement psychotherapist for a [employer]. Their last name is pronounced foh-TEE-ah and they tell me it means "burning" in [place]. Their children are adopted. Jamie [name] lives in [place] and works in Press photographer, and [place] lives in [place] and is an Scientist, water quality. The patient has 3 grandchildren. She is not a Ambulance person.    ADVANCED DIRECTIVES: In place   HEALTH MAINTENANCE: Social History  Substance Use Topics  . Smoking status: Never Smoker  . Smokeless tobacco: Never Used  . Alcohol use 0.0 oz/week     Comment: 1-2     Colonoscopy:2009  PQD:IYMEBR 2018  Bone density:April 2016   Allergies  Allergen Reactions  . Adhesive [Tape]   . Codeine Other (See Comments)    Dizzy, nausea  . Sulfamethoxazole Other (See Comments)    Sick stomach    Current Outpatient Prescriptions  Medication Sig Dispense Refill  . CALCIUM PO Take 500 mg by mouth 2 (two) times daily. Skeletal strength 394m Calium    . cetirizine (ZYRTEC) 10 MG tablet Take 10 mg by mouth daily.    . Cholecalciferol (VITAMIN D3) 1000 units CAPS Take 1 capsule by mouth daily.    . Cyanocobalamin (B-12) 2500 MCG TABS Take by mouth.    . Lactobacillus (ACIDOPHILUS PROBIOTIC PO) Take by mouth daily.    .Marland Kitchenlidocaine-prilocaine (EMLA) cream APP EXT  AA 1 TIME  3  . Multiple Vitamin (MULTIVITAMIN) tablet Take 1 tablet by mouth daily.    . ondansetron (ZOFRAN) 8 MG tablet Take by mouth every 8 (eight) hours as needed.    . prochlorperazine (COMPAZINE) 10 MG tablet Take 10 mg by mouth every 6 (six) hours as needed.    .Marland KitchenoxyCODONE (OXY IR/ROXICODONE) 5 MG immediate release tablet Take 1-2 tablets (5-10 mg total) by mouth every 6 (six) hours as needed for moderate pain, severe pain or breakthrough pain. (Patient not taking: Reported on 09/18/2016) 20 tablet 0  . SYMBICORT 160-4.5 MCG/ACT inhaler INL 2 PFS ITL BID  1   No current facility-administered medications for this visit.    Facility-Administered Medications Ordered in Other Visits  Medication Dose Route Frequency Provider Last Rate Last Dose  . famotidine (PEPCID) IVPB 20 mg premix  20 mg Intravenous Once GChauncey Cruel MD   20 mg at 09/18/16 1021  . heparin lock flush 100 unit/mL  500 Units Intracatheter Once PRN GChauncey Cruel MD      . PACLitaxel (TAXOL) 144 mg in dextrose 5 % 250 mL chemo infusion (</= 851mm2)  80 mg/m2 (  Treatment Plan Recorded) Intravenous Once [name], MD      . sodium chloride flush (NS) 0.9 % injection 10 mL  10 mL Intracatheter PRN [name], MD      . trastuzumab (HERCEPTIN) 399 mg in sodium chloride 0.9 % 250 mL chemo infusion  6 mg/kg (Treatment Plan Recorded) Intravenous Once [name], MD        OBJECTIVE:  Vitals:   09/18/16 0853  BP: 123/68  Pulse: 70  Resp: 20  Temp: 98.3 F (36.8 C)     Body mass index is 24.71 kg/m.    ECOG FS:0 - Asymptomatic GENERAL: Patient is a well appearing female in no acute distress HEENT:  Sclerae anicteric.  Oropharynx clear and moist. No ulcerations or evidence of oropharyngeal candidiasis. Neck is supple.  NODES:  No cervical, supraclavicular, or axillary lymphadenopathy palpated.  BREAST EXAM:  Deferred. LUNGS:  Clear to auscultation bilaterally.  No wheezes or rhonchi. HEART:   Regular rate and rhythm. No murmur appreciated. ABDOMEN:  Soft, nontender.  Positive, normoactive bowel sounds. No organomegaly palpated. MSK:  No focal spinal tenderness to palpation. Full range of motion bilaterally in the upper extremities. EXTREMITIES:  No peripheral edema.   SKIN:  Clear with no obvious rashes or skin changes. No nail dyscrasia. NEURO:  Nonfocal. Well oriented.  Appropriate affect.    LAB RESULTS:  CMP     Component Value Date/Time   NA 141 09/18/2016 0817   K 4.3 09/18/2016 0817   CL 100 08/04/2015 0831   CO2 27 09/18/2016 0817   GLUCOSE 73 09/18/2016 0817   BUN 14.6 09/18/2016 0817   CREATININE 0.8 09/18/2016 0817   CALCIUM 9.9 09/18/2016 0817   PROT 7.3 09/18/2016 0817   ALBUMIN 3.9 09/18/2016 0817   AST 18 09/18/2016 0817   ALT 16 09/18/2016 0817   ALKPHOS 65 09/18/2016 0817   BILITOT 0.34 09/18/2016 0817   GFRNONAA 86 08/04/2015 0831   GFRAA >89 08/04/2015 0831    INo results found for: SPEP, UPEP  Lab Results  Component Value Date   WBC 7.6 09/18/2016   NEUTROABS 4.5 09/18/2016   HGB 12.6 09/18/2016   HCT 37.9 09/18/2016   MCV 91.1 09/18/2016   PLT 331 09/18/2016      Chemistry      Component Value Date/Time   NA 141 09/18/2016 0817   K 4.3 09/18/2016 0817   CL 100 08/04/2015 0831   CO2 27 09/18/2016 0817   BUN 14.6 09/18/2016 0817   CREATININE 0.8 09/18/2016 0817   GLU 73 08/02/2014      Component Value Date/Time   CALCIUM 9.9 09/18/2016 0817   ALKPHOS 65 09/18/2016 0817   AST 18 09/18/2016 0817   ALT 16 09/18/2016 0817   BILITOT 0.34 09/18/2016 0817       No results found for: LABCA2  No components found for: LABCA125  No results for input(s): INR in the last 168 hours.  Urinalysis No results found for: COLORURINE, APPEARANCEUR, LABSPEC, PHURINE, GLUCOSEU, HGBUR, BILIRUBINUR, KETONESUR, PROTEINUR, UROBILINOGEN, NITRITE, LEUKOCYTESUR   STUDIES: Echo and genetics results review  ELIGIBLE FOR AVAILABLE RESEARCH  PROTOCOL: no  ASSESSMENT: 61 y.o. [place] Ser, [place] woman status post biopsy of the left breast upper outer quadrant lesion 07/03/2016 showing a clinical T1b N0, stage 1B invasive ductal carcinoma, grade 2 or 3, estrogen receptor weakly positive at 5%, progesterone receptor negative, but HER-2 strongly amplified, with an MIB-1 of 50%.  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      PLAN: Bryn is doing well today.  Her CBC is normal (CMET pending).  She will proceed with treatment today.  I reviewed Paclitaxel with her in detail.  We in particular discussed the risk of peripheral neuropathy, and she verbalized understanding of this.  She will return in one week for labs, evaluation, and her second cycle of Paclitaxel.  I also answered some intimacy questions she and her husband had.   She has a good understanding of this plan. She agrees with it. She will call with any problems that may develop before her next visit here.  A total of (30) minutes of face-to-face time was spent with this patient with greater than 50% of that time in counseling and care-coordination.   [name], NP   09/18/2016 10:31 AM Medical Oncology and Hematology [facility] [address] [place], [place] [address] Tel. [phone]    Fax. [phone]

## 2016-09-18 ENCOUNTER — Ambulatory Visit: Payer: Managed Care, Other (non HMO)

## 2016-09-18 ENCOUNTER — Encounter: Payer: Self-pay | Admitting: Adult Health

## 2016-09-18 ENCOUNTER — Encounter: Payer: Self-pay | Admitting: *Deleted

## 2016-09-18 ENCOUNTER — Other Ambulatory Visit (HOSPITAL_BASED_OUTPATIENT_CLINIC_OR_DEPARTMENT_OTHER): Payer: Managed Care, Other (non HMO)

## 2016-09-18 ENCOUNTER — Ambulatory Visit (HOSPITAL_BASED_OUTPATIENT_CLINIC_OR_DEPARTMENT_OTHER): Payer: Managed Care, Other (non HMO) | Admitting: Adult Health

## 2016-09-18 ENCOUNTER — Other Ambulatory Visit: Payer: Self-pay | Admitting: Oncology

## 2016-09-18 ENCOUNTER — Ambulatory Visit (HOSPITAL_BASED_OUTPATIENT_CLINIC_OR_DEPARTMENT_OTHER): Payer: Managed Care, Other (non HMO)

## 2016-09-18 VITALS — BP 111/61 | HR 77 | Temp 98.3°F | Resp 18

## 2016-09-18 VITALS — BP 123/68 | HR 70 | Temp 98.3°F | Resp 20 | Wt 153.1 lb

## 2016-09-18 DIAGNOSIS — C50412 Malignant neoplasm of upper-outer quadrant of left female breast: Secondary | ICD-10-CM

## 2016-09-18 DIAGNOSIS — Z170 Estrogen receptor positive status [ER+]: Secondary | ICD-10-CM

## 2016-09-18 DIAGNOSIS — Z5112 Encounter for antineoplastic immunotherapy: Secondary | ICD-10-CM | POA: Diagnosis not present

## 2016-09-18 DIAGNOSIS — Z5111 Encounter for antineoplastic chemotherapy: Secondary | ICD-10-CM

## 2016-09-18 LAB — CBC WITH DIFFERENTIAL/PLATELET
BASO%: 0.8 % (ref 0.0–2.0)
BASOS ABS: 0.1 10*3/uL (ref 0.0–0.1)
EOS%: 5 % (ref 0.0–7.0)
Eosinophils Absolute: 0.4 10*3/uL (ref 0.0–0.5)
HEMATOCRIT: 37.9 % (ref 34.8–46.6)
HEMOGLOBIN: 12.6 g/dL (ref 11.6–15.9)
LYMPH#: 2 10*3/uL (ref 0.9–3.3)
LYMPH%: 26.8 % (ref 14.0–49.7)
MCH: 30.3 pg (ref 25.1–34.0)
MCHC: 33.2 g/dL (ref 31.5–36.0)
MCV: 91.1 fL (ref 79.5–101.0)
MONO#: 0.6 10*3/uL (ref 0.1–0.9)
MONO%: 7.8 % (ref 0.0–14.0)
NEUT#: 4.5 10*3/uL (ref 1.5–6.5)
NEUT%: 59.6 % (ref 38.4–76.8)
Platelets: 331 10*3/uL (ref 145–400)
RBC: 4.16 10*6/uL (ref 3.70–5.45)
RDW: 12.7 % (ref 11.2–14.5)
WBC: 7.6 10*3/uL (ref 3.9–10.3)

## 2016-09-18 LAB — COMPREHENSIVE METABOLIC PANEL
ALK PHOS: 65 U/L (ref 40–150)
ALT: 16 U/L (ref 0–55)
AST: 18 U/L (ref 5–34)
Albumin: 3.9 g/dL (ref 3.5–5.0)
Anion Gap: 9 mEq/L (ref 3–11)
BILIRUBIN TOTAL: 0.34 mg/dL (ref 0.20–1.20)
BUN: 14.6 mg/dL (ref 7.0–26.0)
CALCIUM: 9.9 mg/dL (ref 8.4–10.4)
CO2: 27 mEq/L (ref 22–29)
CREATININE: 0.8 mg/dL (ref 0.6–1.1)
Chloride: 104 mEq/L (ref 98–109)
EGFR: 83 mL/min/{1.73_m2} — ABNORMAL LOW (ref >90)
Glucose: 73 mg/dl (ref 70–140)
Potassium: 4.3 mEq/L (ref 3.5–5.1)
Sodium: 141 mEq/L (ref 136–145)
TOTAL PROTEIN: 7.3 g/dL (ref 6.4–8.3)

## 2016-09-18 MED ORDER — SODIUM CHLORIDE 0.9 % IV SOLN
6.0000 mg/kg | Freq: Once | INTRAVENOUS | Status: AC
  Administered 2016-09-18: 399 mg via INTRAVENOUS
  Filled 2016-09-18: qty 19

## 2016-09-18 MED ORDER — SODIUM CHLORIDE 0.9 % IV SOLN
Freq: Once | INTRAVENOUS | Status: AC
  Administered 2016-09-18: 10:00:00 via INTRAVENOUS

## 2016-09-18 MED ORDER — FAMOTIDINE IN NACL 20-0.9 MG/50ML-% IV SOLN
20.0000 mg | Freq: Once | INTRAVENOUS | Status: AC
  Administered 2016-09-18: 20 mg via INTRAVENOUS

## 2016-09-18 MED ORDER — ACETAMINOPHEN 325 MG PO TABS
650.0000 mg | ORAL_TABLET | Freq: Once | ORAL | Status: AC
  Administered 2016-09-18: 650 mg via ORAL

## 2016-09-18 MED ORDER — DIPHENHYDRAMINE HCL 25 MG PO CAPS: 25.0000 mg | ORAL_CAPSULE | Freq: Once | ORAL | Status: DC

## 2016-09-18 MED ORDER — DIPHENHYDRAMINE HCL 50 MG/ML IJ SOLN
INTRAMUSCULAR | Status: AC
  Filled 2016-09-18: qty 1

## 2016-09-18 MED ORDER — DIPHENHYDRAMINE HCL 50 MG/ML IJ SOLN
25.0000 mg | Freq: Once | INTRAMUSCULAR | Status: AC
  Administered 2016-09-18: 25 mg via INTRAVENOUS

## 2016-09-18 MED ORDER — SODIUM CHLORIDE 0.9% FLUSH
10.0000 mL | INTRAVENOUS | Status: DC | PRN
  Administered 2016-09-18: 10 mL
  Filled 2016-09-18: qty 10

## 2016-09-18 MED ORDER — ACETAMINOPHEN 325 MG PO TABS
ORAL_TABLET | ORAL | Status: AC
  Filled 2016-09-18: qty 2

## 2016-09-18 MED ORDER — FAMOTIDINE IN NACL 20-0.9 MG/50ML-% IV SOLN
INTRAVENOUS | Status: AC
  Filled 2016-09-18: qty 50

## 2016-09-18 MED ORDER — PACLITAXEL CHEMO INJECTION 300 MG/50ML
80.0000 mg/m2 | Freq: Once | INTRAVENOUS | Status: AC
  Administered 2016-09-18: 144 mg via INTRAVENOUS
  Filled 2016-09-18: qty 24

## 2016-09-18 MED ORDER — HEPARIN SOD (PORK) LOCK FLUSH 100 UNIT/ML IV SOLN
500.0000 [IU] | Freq: Once | INTRAVENOUS | Status: AC | PRN
  Administered 2016-09-18: 500 [IU]
  Filled 2016-09-18: qty 5

## 2016-09-18 MED ORDER — DEXAMETHASONE SODIUM PHOSPHATE 10 MG/ML IJ SOLN
INTRAMUSCULAR | Status: AC
  Filled 2016-09-18: qty 1

## 2016-09-18 MED ORDER — DEXAMETHASONE SODIUM PHOSPHATE 10 MG/ML IJ SOLN
10.0000 mg | Freq: Once | INTRAMUSCULAR | Status: AC
  Administered 2016-09-18: 10 mg via INTRAVENOUS

## 2016-09-18 NOTE — Patient Instructions (Signed)
[facility] Discharge Instructions for Patients Receiving Chemotherapy  Today you received the following chemotherapy agents: Herceptin  & Taxol  To help prevent nausea and vomiting after your treatment, we encourage you to take your nausea medication as directed.    If you develop nausea and vomiting that is not controlled by your nausea medication, call the clinic.   BELOW ARE SYMPTOMS THAT SHOULD BE REPORTED IMMEDIATELY:  *FEVER GREATER THAN 100.5 F  *CHILLS WITH OR WITHOUT FEVER  NAUSEA AND VOMITING THAT IS NOT CONTROLLED WITH YOUR NAUSEA MEDICATION  *UNUSUAL SHORTNESS OF BREATH  *UNUSUAL BRUISING OR BLEEDING  TENDERNESS IN MOUTH AND THROAT WITH OR WITHOUT PRESENCE OF ULCERS  *URINARY PROBLEMS  *BOWEL PROBLEMS  UNUSUAL RASH Items with * indicate a potential emergency and should be followed up as soon as possible.  Feel free to call the clinic you have any questions or concerns. The clinic phone number is [phone].  Please show the [facility] at check-in to the Emergency Department and triage nurse.  Paclitaxel injection (TAXOL) What is this medicine? PACLITAXEL (PAK li TAX el) is a chemotherapy drug. It targets fast dividing cells, like cancer cells, and causes these cells to die. This medicine is used to treat ovarian cancer, breast cancer, and other cancers. This medicine may be used for other purposes; ask your health care provider or pharmacist if you have questions. COMMON BRAND NAME(S): Onxol, Taxol What should I tell my health care provider before I take this medicine? They need to know if you have any of these conditions: -blood disorders -irregular heartbeat -infection (especially a virus infection such as chickenpox, cold sores, or herpes) -liver disease -previous or ongoing radiation therapy -an unusual or allergic reaction to paclitaxel, alcohol, polyoxyethylated castor oil, other chemotherapy agents, other medicines, foods,  dyes, or preservatives -pregnant or trying to get pregnant -breast-feeding How should I use this medicine? This drug is given as an infusion into a vein. It is administered in a hospital or clinic by a specially trained health care professional. Talk to your pediatrician regarding the use of this medicine in children. Special care may be needed. Overdosage: If you think you have taken too much of this medicine contact a poison control center or emergency room at once. NOTE: This medicine is only for you. Do not share this medicine with others. What if I miss a dose? It is important not to miss your dose. Call your doctor or health care professional if you are unable to keep an appointment. What may interact with this medicine? Do not take this medicine with any of the following medications: -disulfiram -metronidazole This medicine may also interact with the following medications: -cyclosporine -diazepam -ketoconazole -medicines to increase blood counts like filgrastim, pegfilgrastim, sargramostim -other chemotherapy drugs like cisplatin, doxorubicin, epirubicin, etoposide, teniposide, vincristine -quinidine -testosterone -vaccines -verapamil Talk to your doctor or health care professional before taking any of these medicines: -acetaminophen -aspirin -ibuprofen -ketoprofen -naproxen This list may not describe all possible interactions. Give your health care provider a list of all the medicines, herbs, non-prescription drugs, or dietary supplements you use. Also tell them if you smoke, drink alcohol, or use illegal drugs. Some items may interact with your medicine. What should I watch for while using this medicine? Your condition will be monitored carefully while you are receiving this medicine. You will need important blood work done while you are taking this medicine. This medicine can cause serious allergic reactions. To reduce your risk you  will need to take other medicine(s)  before treatment with this medicine. If you experience allergic reactions like skin rash, itching or hives, swelling of the face, lips, or tongue, tell your doctor or health care professional right away. In some cases, you may be given additional medicines to help with side effects. Follow all directions for their use. This drug may make you feel generally unwell. This is not uncommon, as chemotherapy can affect healthy cells as well as cancer cells. Report any side effects. Continue your course of treatment even though you feel ill unless your doctor tells you to stop. Call your doctor or health care professional for advice if you get a fever, chills or sore throat, or other symptoms of a cold or flu. Do not treat yourself. This drug decreases your body's ability to fight infections. Try to avoid being around people who are sick. This medicine may increase your risk to bruise or bleed. Call your doctor or health care professional if you notice any unusual bleeding. Be careful brushing and flossing your teeth or using a toothpick because you may get an infection or bleed more easily. If you have any dental work done, tell your dentist you are receiving this medicine. Avoid taking products that contain aspirin, acetaminophen, ibuprofen, naproxen, or ketoprofen unless instructed by your doctor. These medicines may hide a fever. Do not become pregnant while taking this medicine. Women should inform their doctor if they wish to become pregnant or think they might be pregnant. There is a potential for serious side effects to an unborn child. Talk to your health care professional or pharmacist for more information. Do not breast-feed an infant while taking this medicine. Men are advised not to father a child while receiving this medicine. This product may contain alcohol. Ask your pharmacist or healthcare provider if this medicine contains alcohol. Be sure to tell all healthcare providers you are taking this  medicine. Certain medicines, like metronidazole and disulfiram, can cause an unpleasant reaction when taken with alcohol. The reaction includes flushing, headache, nausea, vomiting, sweating, and increased thirst. The reaction can last from 30 minutes to several hours. What side effects may I notice from receiving this medicine? Side effects that you should report to your doctor or health care professional as soon as possible: -allergic reactions like skin rash, itching or hives, swelling of the face, lips, or tongue -low blood counts - This drug may decrease the number of white blood cells, red blood cells and platelets. You may be at increased risk for infections and bleeding. -signs of infection - fever or chills, cough, sore throat, pain or difficulty passing urine -signs of decreased platelets or bleeding - bruising, pinpoint red spots on the skin, black, tarry stools, nosebleeds -signs of decreased red blood cells - unusually weak or tired, fainting spells, lightheadedness -breathing problems -chest pain -high or low blood pressure -mouth sores -nausea and vomiting -pain, swelling, redness or irritation at the injection site -pain, tingling, numbness in the hands or feet -slow or irregular heartbeat -swelling of the ankle, feet, hands Side effects that usually do not require medical attention (report to your doctor or health care professional if they continue or are bothersome): -bone pain -complete hair loss including hair on your head, underarms, pubic hair, eyebrows, and eyelashes -changes in the color of fingernails -diarrhea -loosening of the fingernails -loss of appetite -muscle or joint pain -red flush to skin -sweating This list may not describe all possible side effects. Call your doctor for   medical advice about side effects. You may report side effects to FDA at 1-800-FDA-1088. Where should I keep my medicine? This drug is given in a hospital or clinic and will not be  stored at home. NOTE: This sheet is a summary. It may not cover all possible information. If you have questions about this medicine, talk to your doctor, pharmacist, or health care provider.  2018 Elsevier/Gold Standard (2015-03-08 19:58:00)

## 2016-09-18 NOTE — Patient Instructions (Signed)
Implanted Port Home Guide An implanted port is a type of central line that is placed under the skin. Central lines are used to provide IV access when treatment or nutrition needs to be given through a person's veins. Implanted ports are used for long-term IV access. An implanted port may be placed because:  You need IV medicine that would be irritating to the small veins in your hands or arms.  You need long-term IV medicines, such as antibiotics.  You need IV nutrition for a long period.  You need frequent blood draws for lab tests.  You need dialysis.  Implanted ports are usually placed in the chest area, but they can also be placed in the upper arm, the abdomen, or the leg. An implanted port has two main parts:  Reservoir. The reservoir is round and will appear as a small, raised area under your skin. The reservoir is the part where a needle is inserted to give medicines or draw blood.  Catheter. The catheter is a thin, flexible tube that extends from the reservoir. The catheter is placed into a large vein. Medicine that is inserted into the reservoir goes into the catheter and then into the vein.  How will I care for my incision site? Do not get the incision site wet. Bathe or shower as directed by your health care provider. How is my port accessed? Special steps must be taken to access the port:  Before the port is accessed, a numbing cream can be placed on the skin. This helps numb the skin over the port site.  Your health care provider uses a sterile technique to access the port. ? Your health care provider must put on a mask and sterile gloves. ? The skin over your port is cleaned carefully with an antiseptic and allowed to dry. ? The port is gently pinched between sterile gloves, and a needle is inserted into the port.  Only "non-coring" port needles should be used to access the port. Once the port is accessed, a blood return should be checked. This helps ensure that the port  is in the vein and is not clogged.  If your port needs to remain accessed for a constant infusion, a clear (transparent) bandage will be placed over the needle site. The bandage and needle will need to be changed every week, or as directed by your health care provider.  Keep the bandage covering the needle clean and dry. Do not get it wet. Follow your health care provider's instructions on how to take a shower or bath while the port is accessed.  If your port does not need to stay accessed, no bandage is needed over the port.  What is flushing? Flushing helps keep the port from getting clogged. Follow your health care provider's instructions on how and when to flush the port. Ports are usually flushed with saline solution or a medicine called heparin. The need for flushing will depend on how the port is used.  If the port is used for intermittent medicines or blood draws, the port will need to be flushed: ? After medicines have been given. ? After blood has been drawn. ? As part of routine maintenance.  If a constant infusion is running, the port may not need to be flushed.  How long will my port stay implanted? The port can stay in for as long as your health care provider thinks it is needed. When it is time for the port to come out, surgery will be   done to remove it. The procedure is similar to the one performed when the port was put in. When should I seek immediate medical care? When you have an implanted port, you should seek immediate medical care if:  You notice a bad smell coming from the incision site.  You have swelling, redness, or drainage at the incision site.  You have more swelling or pain at the port site or the surrounding area.  You have a fever that is not controlled with medicine.  This information is not intended to replace advice given to you by your health care provider. Make sure you discuss any questions you have with your health care provider. Document  Released: 05/07/2005 Document Revised: 10/13/2015 Document Reviewed: 01/12/2013 Elsevier Interactive Patient Education  2017 Elsevier Inc.  

## 2016-09-20 ENCOUNTER — Telehealth: Payer: Self-pay

## 2016-09-20 NOTE — Telephone Encounter (Signed)
Pt called to report slight numbness in fingertips. Called her back and instructed her to keep [place] informed of degree of numbness, and to talk with Dr [name] at next visit. Pt spoke back "I need to keep tabs on it".

## 2016-09-25 ENCOUNTER — Ambulatory Visit: Payer: Managed Care, Other (non HMO)

## 2016-09-25 ENCOUNTER — Ambulatory Visit (HOSPITAL_BASED_OUTPATIENT_CLINIC_OR_DEPARTMENT_OTHER): Payer: Managed Care, Other (non HMO) | Admitting: Adult Health

## 2016-09-25 ENCOUNTER — Other Ambulatory Visit (HOSPITAL_BASED_OUTPATIENT_CLINIC_OR_DEPARTMENT_OTHER): Payer: Managed Care, Other (non HMO)

## 2016-09-25 ENCOUNTER — Other Ambulatory Visit: Payer: Self-pay | Admitting: *Deleted

## 2016-09-25 VITALS — BP 106/62 | HR 64 | Temp 98.0°F | Resp 18 | Ht 66.0 in | Wt 153.1 lb

## 2016-09-25 DIAGNOSIS — C50412 Malignant neoplasm of upper-outer quadrant of left female breast: Secondary | ICD-10-CM | POA: Diagnosis not present

## 2016-09-25 DIAGNOSIS — Z170 Estrogen receptor positive status [ER+]: Secondary | ICD-10-CM | POA: Diagnosis not present

## 2016-09-25 DIAGNOSIS — Z85828 Personal history of other malignant neoplasm of skin: Secondary | ICD-10-CM

## 2016-09-25 DIAGNOSIS — G620 Drug-induced polyneuropathy: Secondary | ICD-10-CM

## 2016-09-25 DIAGNOSIS — Z95828 Presence of other vascular implants and grafts: Secondary | ICD-10-CM

## 2016-09-25 LAB — COMPREHENSIVE METABOLIC PANEL
ALT: 22 U/L (ref 0–55)
ANION GAP: 10 meq/L (ref 3–11)
AST: 21 U/L (ref 5–34)
Albumin: 3.8 g/dL (ref 3.5–5.0)
Alkaline Phosphatase: 63 U/L (ref 40–150)
BUN: 17.1 mg/dL (ref 7.0–26.0)
CHLORIDE: 105 meq/L (ref 98–109)
CO2: 26 meq/L (ref 22–29)
CREATININE: 0.8 mg/dL (ref 0.6–1.1)
Calcium: 9.5 mg/dL (ref 8.4–10.4)
EGFR: 86 mL/min/{1.73_m2} — ABNORMAL LOW (ref >90)
Glucose: 82 mg/dl (ref 70–140)
Potassium: 4.2 mEq/L (ref 3.5–5.1)
Sodium: 141 mEq/L (ref 136–145)
Total Bilirubin: 0.28 mg/dL (ref 0.20–1.20)
Total Protein: 7 g/dL (ref 6.4–8.3)

## 2016-09-25 LAB — CBC WITH DIFFERENTIAL/PLATELET
BASO%: 2.1 % — ABNORMAL HIGH (ref 0.0–2.0)
Basophils Absolute: 0.1 10*3/uL (ref 0.0–0.1)
EOS%: 5.3 % (ref 0.0–7.0)
Eosinophils Absolute: 0.3 10*3/uL (ref 0.0–0.5)
HCT: 36.7 % (ref 34.8–46.6)
HGB: 12.7 g/dL (ref 11.6–15.9)
LYMPH%: 28.2 % (ref 14.0–49.7)
MCH: 30.9 pg (ref 25.1–34.0)
MCHC: 34.5 g/dL (ref 31.5–36.0)
MCV: 89.6 fL (ref 79.5–101.0)
MONO#: 0.4 10*3/uL (ref 0.1–0.9)
MONO%: 6.6 % (ref 0.0–14.0)
NEUT#: 3.4 10*3/uL (ref 1.5–6.5)
NEUT%: 57.8 % (ref 38.4–76.8)
Platelets: 383 10*3/uL (ref 145–400)
RBC: 4.09 10*6/uL (ref 3.70–5.45)
RDW: 13 % (ref 11.2–14.5)
WBC: 5.9 10*3/uL (ref 3.9–10.3)
lymph#: 1.7 10*3/uL (ref 0.9–3.3)

## 2016-09-25 MED ORDER — LORAZEPAM 0.5 MG PO TABS: 0.5000 mg | ORAL_TABLET | Freq: Every evening | ORAL | 0 refills | Status: DC | PRN

## 2016-09-25 MED ORDER — PROCHLORPERAZINE MALEATE 10 MG PO TABS: 10.0000 mg | ORAL_TABLET | Freq: Four times a day (QID) | ORAL | 1 refills | Status: DC | PRN

## 2016-09-25 MED ORDER — DEXAMETHASONE 4 MG PO TABS: 8.0000 mg | ORAL_TABLET | Freq: Every day | ORAL | 1 refills | Status: DC

## 2016-09-25 MED ORDER — SODIUM CHLORIDE 0.9% FLUSH
10.0000 mL | INTRAVENOUS | Status: DC | PRN
  Administered 2016-09-25: 10 mL via INTRAVENOUS
  Filled 2016-09-25: qty 10

## 2016-09-25 MED ORDER — HEPARIN SOD (PORK) LOCK FLUSH 100 UNIT/ML IV SOLN
500.0000 [IU] | Freq: Once | INTRAVENOUS | Status: AC
Start: 2016-09-25 — End: 2016-09-25
  Administered 2016-09-25: 500 [IU] via INTRAVENOUS
  Filled 2016-09-25: qty 5

## 2016-09-25 MED ORDER — LIDOCAINE-PRILOCAINE 2.5-2.5 % EX CREA: TOPICAL_CREAM | CUTANEOUS | 3 refills | Status: DC

## 2016-09-25 NOTE — Progress Notes (Signed)
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 This showed a possible mass in the left breast. On 07/02/2016 she underwent left diagnostic mammography with tomography and left breast ultrasonography. The breast density was category C. In the left breast upper outer quadrant there was a microlobulated mass which was not directly palpable, although there was slight thickening in the left breast 1:00 position. Targeted ultrasonography confirmed a solid hypoechoic microlobulated mass in the left breast 1:00 radiant 4 cm from the nipple, measuring 0.8 cm.  On 07/03/2016 Jamie [name] underwent biopsy of the left breast mass in question, and this showed ([id number]) invasive ductal carcinoma, grade 2 or 3, with extracellular mucin, estrogen receptor 5% positive with weak staining intensity, progesterone receptor negative, with an MIB-1 of 50%, and HER-2 amplified, the signals ratio being 5.71 and the number per cell 14.55.  Her subsequent history is as detailed below  INTERVAL HISTORY: Jamie [name] is doing well today.  She received her first dose of Paclitaxel last week along with Trastuzumab.  She tolerated the infusion well, however starting  last Thursday she developed a numb sensitivity in the very tips of her fingers.  She denies any pain, or tingling, but says the only way to describe it is as being numb.  She says this is constant and has been since last week.  She does have a mild rash on her left anterior leg that is resolving.  She denies any new detergents, creams, or care items.    REVIEW OF SYSTEMS: Jamie [name] denies chest pain, palpitations, dizziness, nausea/vomiting, swelling, shortness of breath, or any other concerns.     PAST MEDICAL HISTORY: Past Medical History:  Diagnosis Date  . Anxiety   . Arthritis    feet  . Basal cell carcinoma   . Breast cancer ([place]) 06/2016   left breast  . Eczema   . Genetic testing 08/15/2016   Ms. Nied underwent genetic testing for hereditary cancer syndrome through [facility]'s 43-gene Common Hereditary Cancers Panel. Ms. 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 Result report is dated 08/15/2016. Please see genetic counseling documentation from 08/17/2016 for further discussion.  [name] PONV (postoperative nausea and vomiting)     PAST SURGICAL HISTORY: Past Surgical History:  Procedure Laterality Date  . BONE SPUR  2001 AND 1988  . BREAST LUMPECTOMY WITH RADIOACTIVE SEED AND SENTINEL LYMPH NODE BIOPSY Left 07/26/2016   Procedure: BREAST LUMPECTOMY WITH RADIOACTIVE SEED AND SENTINEL LYMPH NODE BIOPSY;  Surgeon: [name], MD;  Location: [name];  Service: General;  Laterality: Left;  . BUNIONECTOMY  10/13  . COLONOSCOPY W/ POLYPECTOMY  08/2007  . DILATION AND CURETTAGE OF UTERUS  2002  . MOHS SURGERY  2010  . PORTACATH PLACEMENT Right 07/26/2016   Procedure: INSERTION PORT-A-CATH;  Surgeon: Dorris Fetch  Barry Dienes, MD;  Location: [place];  Service: General;  Laterality: Right;    FAMILY HISTORY Family History  Problem Relation Age of Onset  . Ovarian cancer Mother 24  .  Hypertension Father   . Stroke Father   . Heart disease Father   . Breast cancer Maternal Aunt 77    recurred at 13  . Heart disease Paternal Grandmother   . Osteoporosis Sister   . Liver cancer Maternal Uncle 52  The patient's father died at age 7, with some form of skin cancer, most likely melanoma. The patient's mother died at the age of 29 with ovarian cancer, which had been diagnosed a few months prior. The patient had no brothers, 1 sister. A maternal aunt was diagnosed with breast cancer at age 79, recurrent age 47.  GYNECOLOGIC HISTORY:  No LMP recorded. Patient is postmenopausal. Menarche age 10, the patient is GX P0. She stopped having periods approximately age 42. 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 Her husband Jamie [name] worked as a Dance movement psychotherapist for a [employer]. Their last name is pronounced foh-TEE-ah and they tell me it means "burning" in [place]. Their children are adopted. Jamie [name] lives in [place] and works in Press photographer, and Jamie [name] lives in [place] and is an Scientist, water quality. The patient has 3 grandchildren. She is not a Ambulance person.    ADVANCED DIRECTIVES: In place   HEALTH MAINTENANCE: Social History  Substance Use Topics  . Smoking status: Never Smoker  . Smokeless tobacco: Never Used  . Alcohol use 0.0 oz/week     Comment: 1-2     Colonoscopy:2009  LEX:NTZGYF 2018  Bone density:April 2016   Allergies  Allergen Reactions  . Codeine Nausea Only and Other (See Comments)    Dizzy  . Sulfamethoxazole Nausea And Vomiting  . Adhesive [Tape]     Current Outpatient Prescriptions  Medication Sig Dispense Refill  . CALCIUM PO Take 500 mg by mouth 2 (two) times daily. Skeletal strength 336m Calium    . cetirizine (ZYRTEC) 10 MG tablet Take 10 mg by mouth daily.    . Cholecalciferol (VITAMIN D3) 1000 units CAPS Take 1 capsule by mouth daily.    . Cyanocobalamin (B-12) 2500  MCG TABS Take by mouth.    . Lactobacillus (ACIDOPHILUS PROBIOTIC PO) Take by mouth daily.    . Multiple Vitamin (MULTIVITAMIN) tablet Take 1 tablet by mouth daily.    . ondansetron (ZOFRAN) 8 MG tablet Take by mouth every 8 (eight) hours as needed.    . prochlorperazine (COMPAZINE) 10 MG tablet Take 10 mg by mouth every 6 (six) hours as needed.    . SYMBICORT 160-4.5 MCG/ACT inhaler INL 2 PFS ITL BID  1  . lidocaine-prilocaine (EMLA) cream Apply one application to port 1-2 hours prior to access. Cover with saran wrap. 30 g 3  . oxyCODONE (OXY IR/ROXICODONE) 5 MG immediate release tablet Take 1-2 tablets (5-10 mg total) by mouth every 6 (six) hours as needed for moderate pain, severe pain or breakthrough pain. (Patient not taking: Reported on 09/18/2016) 20 tablet 0   No current facility-administered medications for this visit.     OBJECTIVE:  Vitals:   09/25/16 0920  BP: 106/62  Pulse: 64  Resp: 18  Temp: 98 F (36.7 C)     Body mass index is 24.71 kg/m.    ECOG FS:0 - Asymptomatic GENERAL: Patient is  a well appearing female in no acute distress HEENT:  Sclerae anicteric.  PERRL.  Oropharynx clear and moist. No ulcerations or evidence of oropharyngeal candidiasis. Neck is supple.  NODES:  No cervical, supraclavicular, or axillary lymphadenopathy palpated.  BREAST EXAM:  Deferred. LUNGS:  Clear to auscultation bilaterally.  No wheezes or rhonchi. HEART:  Regular rate and rhythm. No murmur appreciated. ABDOMEN:  Soft, nontender.  Positive, normoactive bowel sounds. No organomegaly palpated. MSK:  No focal spinal tenderness to palpation. Full range of motion bilaterally in the upper extremities. EXTREMITIES:  No peripheral edema.   SKIN:  Very fin papular erythematous rash on left anterior thigh, only about 4-5 papular areas present. No nail dyscrasia. NEURO:  Nonfocal. Well oriented.  Appropriate affect.    LAB RESULTS:  CMP     Component Value Date/Time   NA 141 09/25/2016 0851    K 4.2 09/25/2016 0851   CL 100 08/04/2015 0831   CO2 26 09/25/2016 0851   GLUCOSE 82 09/25/2016 0851   BUN 17.1 09/25/2016 0851   CREATININE 0.8 09/25/2016 0851   CALCIUM 9.5 09/25/2016 0851   PROT 7.0 09/25/2016 0851   ALBUMIN 3.8 09/25/2016 0851   AST 21 09/25/2016 0851   ALT 22 09/25/2016 0851   ALKPHOS 63 09/25/2016 0851   BILITOT 0.28 09/25/2016 0851   GFRNONAA 86 08/04/2015 0831   GFRAA >89 08/04/2015 0831    INo results found for: SPEP, UPEP  Lab Results  Component Value Date   WBC 5.9 09/25/2016   NEUTROABS 3.4 09/25/2016   HGB 12.7 09/25/2016   HCT 36.7 09/25/2016   MCV 89.6 09/25/2016   PLT 383 09/25/2016      Chemistry      Component Value Date/Time   NA 141 09/25/2016 0851   K 4.2 09/25/2016 0851   CL 100 08/04/2015 0831   CO2 26 09/25/2016 0851   BUN 17.1 09/25/2016 0851   CREATININE 0.8 09/25/2016 0851   GLU 73 08/02/2014      Component Value Date/Time   CALCIUM 9.5 09/25/2016 0851   ALKPHOS 63 09/25/2016 0851   AST 21 09/25/2016 0851   ALT 22 09/25/2016 0851   BILITOT 0.28 09/25/2016 0851       No results found for: LABCA2  No components found for: LABCA125  No results for input(s): INR in the last 168 hours.  Urinalysis No results found for: COLORURINE, APPEARANCEUR, LABSPEC, PHURINE, GLUCOSEU, HGBUR, BILIRUBINUR, KETONESUR, PROTEINUR, UROBILINOGEN, NITRITE, LEUKOCYTESUR   STUDIES: Echo and genetics results review  ELIGIBLE FOR AVAILABLE RESEARCH PROTOCOL: no  ASSESSMENT: 61 y.o. Jamie [name], Jamie [name] woman status post biopsy of the left breast upper outer quadrant lesion 07/03/2016 showing a clinical T1b N0, stage 1B invasive ductal carcinoma, grade 2 or 3, estrogen receptor weakly positive at 5%, progesterone receptor negative, but HER-2 strongly amplified, with an MIB-1 of 50%.  (1) status post left lumpectomy with sentinel lymph node sampling 07/26/2016 for a pT1c pN0, stage IA invasive ductal carcinoma, grade 3, with extracellular  mucin, and negative margins  (2) chemotherapy will consist of Paclitaxel weekly 12 starting 09/18/2016, discontinued after one cycle due to peripheral neuropathy.  To start Gemcitabine and Carboplatin  On 10/02/2016.    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      PLAN: Due to Jamie [name]'s early peripheral neuropathy, she will not receive any further Paclitaxel.  Dr. [name] came in the exam room and discussed a new chemotherapy regimen with her today, Gemcitabine/Carboplatin to begin next week, which he reviewed with her in detail.  She will continue to receive Trastuzumab every three weeks.  Her blood counts were normal today and I reviewed those with her in detail.  Her CMET is pending.    Follow up: 09/25/16-labs, appt with me, and first cycle of Gemcitabine/Carboplatin 10/02/16-labs, appt with Dr. [name], and second cycle of Gemcitabine/Carboplatin  She has a good understanding of this plan. She agrees with it. She will call with any problems that may develop before her next visit here.  A total of (30) minutes of face-to-face time was spent with this patient with greater than 50% of that time in counseling and care-coordination.   Gardenia Phlegm, NP   09/25/2016 10:10 AM Medical Oncology and Hematology [facility] [address] [place], [place] [address] Tel. [phone]    Fax. 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 I don't think we are going to  be able to continue this chemotherapy.  This presents a real problem since she is not a candidate for cyclophosphamide and doxorubicin, receiving anti-HER-2 treatment as she is.  We are going to switch to carboplatin and gemcitabine, given weekly. We will try to give her a total of 11 doses. We may need to give her day 1 and 8 doses and then skip day 15 depending on tolerance.  Today we discussed the possible toxicities, side effects and complications of these agents. She will receive the first dose next week and she will see me in 2 weeks, with her second dose of this combination, just to make sure she is tolerating treatment well  I personally saw this patient and performed a substantive portion of this encounter with the listed APP documented above.   [name], MD Medical Oncology and Hematology [facility] [address] [place], [place] [address] Tel. [phone]    Fax. [phone]

## 2016-09-25 NOTE — Patient Instructions (Signed)

## 2016-09-26 ENCOUNTER — Telehealth: Payer: Self-pay | Admitting: *Deleted

## 2016-09-26 NOTE — Telephone Encounter (Signed)
Tell her to try taking the lorazepam for nausea at night and see if that helps any.  Counsel on good sleep hygiene as well.  I will discuss other options at her next appointment.    Thanks,  [place]

## 2016-09-26 NOTE — Telephone Encounter (Signed)
"  I need something for sleep the night before My chemotherapy appointments.  The last time I was up until 3:00 am and it took three days before I felt better.  I use [facility] in [place]. In [place]."   Denies receipt of lorazepam prescription.  Reviewed use of anti-emetics.  Answered her questions of chemotherapy infusion treatment protocol for new chemotherapy agents gemcitabine and carboplatin.   Will ask provider for a sleep agent.  Lorazepam 0.5 mg order called in as ordered in EPIC on 09-25-2016.  Advised this is ordered for nausea vomiting but causes drowsiness and sleep.

## 2016-09-26 NOTE — Telephone Encounter (Signed)
Called patient with orders per A.P.P.  Appreciated sleep tip advice.

## 2016-09-28 ENCOUNTER — Other Ambulatory Visit: Payer: Self-pay | Admitting: Oncology

## 2016-10-01 ENCOUNTER — Other Ambulatory Visit: Payer: Self-pay | Admitting: Oncology

## 2016-10-01 NOTE — Progress Notes (Unsigned)
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 This showed a possible mass in the left breast. On 07/02/2016 she underwent left diagnostic mammography with tomography and left breast ultrasonography. The breast density was category C. In the left breast upper outer quadrant there was a microlobulated mass which was not directly palpable, although there was slight thickening in the left breast 1:00 position. Targeted ultrasonography confirmed a solid hypoechoic microlobulated mass in the left breast 1:00 radiant 4 cm from the nipple, measuring 0.8 cm.  On 07/03/2016 Jamie [name] underwent biopsy of the left breast mass in question, and this showed ([id number]) invasive ductal carcinoma, grade 2 or 3, with extracellular mucin, estrogen receptor 5% positive with weak staining intensity, progesterone receptor negative, with an MIB-1 of 50%, and HER-2 amplified, the signals ratio being 5.71 and the number per cell 14.55.  Her subsequent history is as detailed below  INTERVAL HISTORY: Jamie [name] is doing well today.  She received her first dose of Paclitaxel last week along with Trastuzumab.  She tolerated the infusion well, however starting  last Thursday she developed a numb sensitivity in the very tips of her fingers.  She denies any pain, or tingling, but says the only way to describe it is as being numb.  She says this is constant and has been since last week.  She does have a mild rash on her left anterior leg that is resolving.  She denies any new detergents, creams, or care items.    REVIEW OF SYSTEMS: Jamie [name] denies chest pain, palpitations, dizziness, nausea/vomiting, swelling, shortness of breath, or any other concerns.     PAST MEDICAL HISTORY: Past Medical History:  Diagnosis Date  . Anxiety   . Arthritis    feet  . Basal cell carcinoma   . Breast cancer ([place]) 06/2016   left breast  . Eczema   . Genetic testing 08/15/2016   Ms. Meckes underwent genetic testing for hereditary cancer syndrome through [facility]'s 43-gene Common Hereditary Cancers Panel. Ms. 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 Result report is dated 08/15/2016. Please see genetic counseling documentation from 08/17/2016 for further discussion.  [name] PONV (postoperative nausea and vomiting)     PAST SURGICAL HISTORY: Past Surgical History:  Procedure Laterality Date  . BONE SPUR  2001 AND 1988  . BREAST LUMPECTOMY WITH RADIOACTIVE SEED AND SENTINEL LYMPH NODE BIOPSY Left 07/26/2016   Procedure: BREAST LUMPECTOMY WITH RADIOACTIVE SEED AND SENTINEL LYMPH NODE BIOPSY;  Surgeon: [name], MD;  Location: [facility];  Service: General;  Laterality: Left;  . BUNIONECTOMY  10/13  . COLONOSCOPY W/ POLYPECTOMY  08/2007  . DILATION AND CURETTAGE OF UTERUS  2002  . MOHS SURGERY  2010  . PORTACATH PLACEMENT Right 07/26/2016   Procedure: INSERTION PORT-A-CATH;  Surgeon: [name],  MD;  Location: [facility];  Service: General;  Laterality: Right;    FAMILY HISTORY Family History  Problem Relation Age of Onset  . Ovarian cancer Mother 16  .  Hypertension Father   . Stroke Father   . Heart disease Father   . Breast cancer Maternal Aunt 77       recurred at 40  . Heart disease Paternal Grandmother   . Osteoporosis Sister   . Liver cancer Maternal Uncle 70  The patient's father died at age 1, with some form of skin cancer, most likely melanoma. The patient's mother died at the age of 47 with ovarian cancer, which had been diagnosed a few months prior. The patient had no brothers, 1 sister. A maternal aunt was diagnosed with breast cancer at age 90, recurrent age 35.  GYNECOLOGIC HISTORY:  No LMP recorded. Patient is postmenopausal. Menarche age 3, the patient is GX P0. She stopped having periods approximately age 86. 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 Her husband Jamie [name] worked as a Dance movement psychotherapist for a [employer]. Their last name is pronounced foh-TEE-ah and they tell me it means "burning" in [place]. Their children are adopted. Jamie [name] lives in [place] and works in Press photographer, and Jamie [name] lives in [place] and is an Scientist, water quality. The patient has 3 grandchildren. She is not a Ambulance person.    ADVANCED DIRECTIVES: In place   HEALTH MAINTENANCE: Social History  Substance Use Topics  . Smoking status: Never Smoker  . Smokeless tobacco: Never Used  . Alcohol use 0.0 oz/week     Comment: 1-2     Colonoscopy:2009  BJY:NWGNFA 2018  Bone density:April 2016   Allergies  Allergen Reactions  . Codeine Nausea Only and Other (See Comments)    Dizzy  . Sulfamethoxazole Nausea And Vomiting  . Adhesive [Tape]     Current Outpatient Prescriptions  Medication Sig Dispense Refill  . CALCIUM PO Take 500 mg by mouth 2 (two) times daily. Skeletal strength 383m Calium    . cetirizine (ZYRTEC) 10 MG tablet Take 10 mg by mouth daily.    . Cholecalciferol (VITAMIN D3) 1000 units CAPS Take 1 capsule by mouth daily.    . Cyanocobalamin (B-12) 2500  MCG TABS Take by mouth.    . dexamethasone (DECADRON) 4 MG tablet Take 2 tablets (8 mg total) by mouth daily. Start the day after chemotherapy for 2 days. Take with food. 30 tablet 1  . Lactobacillus (ACIDOPHILUS PROBIOTIC PO) Take by mouth daily.    .Marland Kitchenlidocaine-prilocaine (EMLA) cream Apply one application to port 1-2 hours prior to access. Cover with saran wrap. 30 g 3  . LORazepam (ATIVAN) 0.5 MG tablet Take 1 tablet (0.5 mg total) by mouth at bedtime as needed (Nausea or vomiting). 30 tablet 0  . Multiple Vitamin (MULTIVITAMIN) tablet Take 1 tablet by mouth daily.    . ondansetron (ZOFRAN) 8 MG tablet Take by mouth every 8 (eight) hours as needed.    .Marland KitchenoxyCODONE (OXY IR/ROXICODONE) 5 MG immediate release tablet Take 1-2 tablets (5-10 mg total) by mouth every 6 (six) hours as needed for moderate pain, severe pain or breakthrough pain. (Patient not taking: Reported on 09/18/2016) 20 tablet 0  . prochlorperazine (COMPAZINE) 10 MG tablet Take 10 mg by mouth every 6 (six) hours as needed.    . prochlorperazine (COMPAZINE) 10 MG tablet Take 1 tablet (10  mg total) by mouth every 6 (six) hours as needed (Nausea or vomiting). 30 tablet 1  . SYMBICORT 160-4.5 MCG/ACT inhaler INL 2 PFS ITL BID  1   No current facility-administered medications for this visit.     OBJECTIVE:  There were no vitals filed for this visit.   There is no height or weight on file to calculate BMI.    ECOG FS:0 - Asymptomatic GENERAL: Patient is a well appearing female in no acute distress HEENT:  Sclerae anicteric.  PERRL.  Oropharynx clear and moist. No ulcerations or evidence of oropharyngeal candidiasis. Neck is supple.  NODES:  No cervical, supraclavicular, or axillary lymphadenopathy palpated.  BREAST EXAM:  Deferred. LUNGS:  Clear to auscultation bilaterally.  No wheezes or rhonchi. HEART:  Regular rate and rhythm. No murmur appreciated. ABDOMEN:  Soft, nontender.  Positive, normoactive bowel sounds. No organomegaly  palpated. MSK:  No focal spinal tenderness to palpation. Full range of motion bilaterally in the upper extremities. EXTREMITIES:  No peripheral edema.   SKIN:  Very fin papular erythematous rash on left anterior thigh, only about 4-5 papular areas present. No nail dyscrasia. NEURO:  Nonfocal. Well oriented.  Appropriate affect.    LAB RESULTS:  CMP     Component Value Date/Time   NA 141 09/25/2016 0851   K 4.2 09/25/2016 0851   CL 100 08/04/2015 0831   CO2 26 09/25/2016 0851   GLUCOSE 82 09/25/2016 0851   BUN 17.1 09/25/2016 0851   CREATININE 0.8 09/25/2016 0851   CALCIUM 9.5 09/25/2016 0851   PROT 7.0 09/25/2016 0851   ALBUMIN 3.8 09/25/2016 0851   AST 21 09/25/2016 0851   ALT 22 09/25/2016 0851   ALKPHOS 63 09/25/2016 0851   BILITOT 0.28 09/25/2016 0851   GFRNONAA 86 08/04/2015 0831   GFRAA >89 08/04/2015 0831    INo results found for: SPEP, UPEP  Lab Results  Component Value Date   WBC 5.9 09/25/2016   NEUTROABS 3.4 09/25/2016   HGB 12.7 09/25/2016   HCT 36.7 09/25/2016   MCV 89.6 09/25/2016   PLT 383 09/25/2016      Chemistry      Component Value Date/Time   NA 141 09/25/2016 0851   K 4.2 09/25/2016 0851   CL 100 08/04/2015 0831   CO2 26 09/25/2016 0851   BUN 17.1 09/25/2016 0851   CREATININE 0.8 09/25/2016 0851   GLU 73 08/02/2014      Component Value Date/Time   CALCIUM 9.5 09/25/2016 0851   ALKPHOS 63 09/25/2016 0851   AST 21 09/25/2016 0851   ALT 22 09/25/2016 0851   BILITOT 0.28 09/25/2016 0851       No results found for: LABCA2  No components found for: LABCA125  No results for input(s): INR in the last 168 hours.  Urinalysis No results found for: COLORURINE, APPEARANCEUR, LABSPEC, PHURINE, GLUCOSEU, HGBUR, BILIRUBINUR, KETONESUR, PROTEINUR, UROBILINOGEN, NITRITE, LEUKOCYTESUR   STUDIES: Echo and genetics results review  ELIGIBLE FOR AVAILABLE RESEARCH PROTOCOL: no  ASSESSMENT: 61 y.o. Jamie [name], Jamie [name] status post biopsy  of the left breast upper outer quadrant lesion 07/03/2016 showing a clinical T1b N0, stage 1B invasive ductal carcinoma, grade 2 or 3, estrogen receptor weakly positive at 5%, progesterone receptor negative, but HER-2 strongly amplified, with an MIB-1 of 50%.  (1) status post left lumpectomy with sentinel lymph node sampling 07/26/2016 for a pT1c pN0, stage IA invasive ductal carcinoma, grade 3, with extracellular mucin, and negative margins  (2) chemotherapy will consist  of Paclitaxel weekly 12 starting 09/18/2016, discontinued after one cycle due to peripheral neuropathy.  To start Gemcitabine and Carboplatin  On 10/02/2016.    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      PLAN: Due to Jamie [name]'s early peripheral neuropathy, she will not receive any further Paclitaxel.  Dr. [name] came in the exam room and discussed a new chemotherapy regimen with her today, Gemcitabine/Carboplatin to begin next week, which he reviewed with her in detail.  She will continue to receive Trastuzumab every three weeks.  Her blood counts were normal today and I reviewed those with her in detail.  Her CMET is pending.    Follow up: 09/25/16-labs, appt with me, and first cycle of Gemcitabine/Carboplatin 10/02/16-labs, appt with Dr. [name], and second cycle of Gemcitabine/Carboplatin  She has a good understanding of this plan. She agrees with it. She will call with any problems that may develop before her next visit here.  A total of (30) minutes of face-to-face time was  spent with this patient with greater than 50% of that time in counseling and care-coordination.   Gardenia Phlegm, NP   10/01/2016 8:58 AM Medical Oncology and Hematology [facility] [address] [place], [place] [address] Tel. [phone]    Fax. 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 I don't think we are going to be able to continue this chemotherapy.  This presents a real problem since she is not a candidate for cyclophosphamide and doxorubicin, receiving anti-HER-2 treatment as she is.  We are going to switch to carboplatin and gemcitabine, given weekly. We will try to give her a total of 11 doses. We may need to give her day 1 and 8 doses and then skip day 15 depending on tolerance.  Today we discussed the possible toxicities, side effects and complications of these agents. She will receive the first dose next week and she will see me in 2 weeks, with her second dose of this combination, just to make sure she is tolerating treatment well  I personally saw this patient and performed a substantive portion of this encounter with the listed APP documented above.   [name], MD Medical Oncology and Hematology [facility] [address] [place], [place] [address] Tel. [phone]    Fax. [phone]

## 2016-10-02 ENCOUNTER — Ambulatory Visit: Payer: Managed Care, Other (non HMO)

## 2016-10-02 ENCOUNTER — Other Ambulatory Visit (HOSPITAL_BASED_OUTPATIENT_CLINIC_OR_DEPARTMENT_OTHER): Payer: Managed Care, Other (non HMO)

## 2016-10-02 ENCOUNTER — Other Ambulatory Visit: Payer: Self-pay | Admitting: *Deleted

## 2016-10-02 ENCOUNTER — Telehealth: Payer: Self-pay | Admitting: *Deleted

## 2016-10-02 ENCOUNTER — Ambulatory Visit (HOSPITAL_BASED_OUTPATIENT_CLINIC_OR_DEPARTMENT_OTHER): Payer: Managed Care, Other (non HMO)

## 2016-10-02 ENCOUNTER — Encounter: Payer: Self-pay | Admitting: Adult Health

## 2016-10-02 ENCOUNTER — Ambulatory Visit (HOSPITAL_BASED_OUTPATIENT_CLINIC_OR_DEPARTMENT_OTHER): Payer: Managed Care, Other (non HMO) | Admitting: Adult Health

## 2016-10-02 VITALS — BP 116/72 | HR 72 | Temp 98.7°F

## 2016-10-02 VITALS — BP 127/66 | HR 63 | Temp 98.3°F | Resp 20 | Ht 66.0 in | Wt 153.9 lb

## 2016-10-02 DIAGNOSIS — Z170 Estrogen receptor positive status [ER+]: Secondary | ICD-10-CM | POA: Diagnosis not present

## 2016-10-02 DIAGNOSIS — G620 Drug-induced polyneuropathy: Secondary | ICD-10-CM | POA: Diagnosis not present

## 2016-10-02 DIAGNOSIS — C50412 Malignant neoplasm of upper-outer quadrant of left female breast: Secondary | ICD-10-CM | POA: Diagnosis not present

## 2016-10-02 DIAGNOSIS — Z5111 Encounter for antineoplastic chemotherapy: Secondary | ICD-10-CM

## 2016-10-02 DIAGNOSIS — Z95828 Presence of other vascular implants and grafts: Secondary | ICD-10-CM

## 2016-10-02 LAB — COMPREHENSIVE METABOLIC PANEL
ALT: 21 U/L (ref 0–55)
AST: 19 U/L (ref 5–34)
Albumin: 3.8 g/dL (ref 3.5–5.0)
Alkaline Phosphatase: 62 U/L (ref 40–150)
Anion Gap: 8 mEq/L (ref 3–11)
BILIRUBIN TOTAL: 0.29 mg/dL (ref 0.20–1.20)
BUN: 12.6 mg/dL (ref 7.0–26.0)
CHLORIDE: 105 meq/L (ref 98–109)
CO2: 28 meq/L (ref 22–29)
CREATININE: 0.8 mg/dL (ref 0.6–1.1)
Calcium: 9.5 mg/dL (ref 8.4–10.4)
EGFR: 82 mL/min/{1.73_m2} — ABNORMAL LOW (ref >90)
Glucose: 89 mg/dl (ref 70–140)
Potassium: 4 mEq/L (ref 3.5–5.1)
Sodium: 141 mEq/L (ref 136–145)
TOTAL PROTEIN: 6.9 g/dL (ref 6.4–8.3)

## 2016-10-02 LAB — CBC WITH DIFFERENTIAL/PLATELET
BASO%: 1.9 % (ref 0.0–2.0)
Basophils Absolute: 0.1 10*3/uL (ref 0.0–0.1)
EOS%: 5 % (ref 0.0–7.0)
Eosinophils Absolute: 0.3 10*3/uL (ref 0.0–0.5)
HEMATOCRIT: 37.3 % (ref 34.8–46.6)
HGB: 12.3 g/dL (ref 11.6–15.9)
LYMPH#: 1.8 10*3/uL (ref 0.9–3.3)
LYMPH%: 34.9 % (ref 14.0–49.7)
MCH: 30.4 pg (ref 25.1–34.0)
MCHC: 33 g/dL (ref 31.5–36.0)
MCV: 92.3 fL (ref 79.5–101.0)
MONO#: 0.5 10*3/uL (ref 0.1–0.9)
MONO%: 9.4 % (ref 0.0–14.0)
NEUT%: 48.8 % (ref 38.4–76.8)
NEUTROS ABS: 2.5 10*3/uL (ref 1.5–6.5)
Platelets: 359 10*3/uL (ref 145–400)
RBC: 4.04 10*6/uL (ref 3.70–5.45)
RDW: 12.6 % (ref 11.2–14.5)
WBC: 5.2 10*3/uL (ref 3.9–10.3)

## 2016-10-02 MED ORDER — DEXAMETHASONE SODIUM PHOSPHATE 10 MG/ML IJ SOLN
10.0000 mg | Freq: Once | INTRAMUSCULAR | Status: AC
  Administered 2016-10-02: 10 mg via INTRAVENOUS

## 2016-10-02 MED ORDER — SODIUM CHLORIDE 0.9% FLUSH
10.0000 mL | INTRAVENOUS | Status: DC | PRN
  Administered 2016-10-02: 10 mL
  Filled 2016-10-02: qty 10

## 2016-10-02 MED ORDER — HEPARIN SOD (PORK) LOCK FLUSH 100 UNIT/ML IV SOLN
500.0000 [IU] | Freq: Once | INTRAVENOUS | Status: AC | PRN
  Administered 2016-10-02: 500 [IU]
  Filled 2016-10-02: qty 5

## 2016-10-02 MED ORDER — SODIUM CHLORIDE 0.9 % IV SOLN
1000.0000 mg/m2 | Freq: Once | INTRAVENOUS | Status: AC
  Administered 2016-10-02: 1786 mg via INTRAVENOUS
  Filled 2016-10-02: qty 46.97

## 2016-10-02 MED ORDER — DEXAMETHASONE SODIUM PHOSPHATE 10 MG/ML IJ SOLN
INTRAMUSCULAR | Status: AC
  Filled 2016-10-02: qty 1

## 2016-10-02 MED ORDER — SODIUM CHLORIDE 0.9 % IV SOLN
Freq: Once | INTRAVENOUS | Status: AC
  Administered 2016-10-02: 14:00:00 via INTRAVENOUS

## 2016-10-02 MED ORDER — SODIUM CHLORIDE 0.9 % IV SOLN: 10.0000 mg | Freq: Once | INTRAVENOUS | Status: DC

## 2016-10-02 MED ORDER — PALONOSETRON HCL INJECTION 0.25 MG/5ML
INTRAVENOUS | Status: AC
  Filled 2016-10-02: qty 5

## 2016-10-02 MED ORDER — PALONOSETRON HCL INJECTION 0.25 MG/5ML
0.2500 mg | Freq: Once | INTRAVENOUS | Status: AC
  Administered 2016-10-02: 0.25 mg via INTRAVENOUS

## 2016-10-02 MED ORDER — SODIUM CHLORIDE 0.9 % IV SOLN
211.8000 mg | Freq: Once | INTRAVENOUS | Status: AC
  Administered 2016-10-02: 210 mg via INTRAVENOUS
  Filled 2016-10-02: qty 21

## 2016-10-02 MED ORDER — SODIUM CHLORIDE 0.9% FLUSH
10.0000 mL | INTRAVENOUS | Status: DC | PRN
  Administered 2016-10-02: 10 mL via INTRAVENOUS
  Filled 2016-10-02: qty 10

## 2016-10-02 NOTE — Telephone Encounter (Signed)
Per MD - peer to peer - authorization obtained for Gemzar - authorization number is [id number].  This RN contacted pharmacy per above due to scheduled appointment at 130 pm today

## 2016-10-02 NOTE — Patient Instructions (Signed)

## 2016-10-02 NOTE — Patient Instructions (Addendum)
[facility] Discharge Instructions for Patients Receiving Chemotherapy  Today you received the following chemotherapy agents :  Gemzar/Carboplatin  To help prevent nausea and vomiting after your treatment, we encourage you to take your nausea medication.  If you develop nausea and vomiting that is not controlled by your nausea medication, call the clinic.   BELOW ARE SYMPTOMS THAT SHOULD BE REPORTED IMMEDIATELY:  *FEVER GREATER THAN 100.5 F  *CHILLS WITH OR WITHOUT FEVER  NAUSEA AND VOMITING THAT IS NOT CONTROLLED WITH YOUR NAUSEA MEDICATION  *UNUSUAL SHORTNESS OF BREATH  *UNUSUAL BRUISING OR BLEEDING  TENDERNESS IN MOUTH AND THROAT WITH OR WITHOUT PRESENCE OF ULCERS  *URINARY PROBLEMS  *BOWEL PROBLEMS  UNUSUAL RASH Items with * indicate a potential emergency and should be followed up as soon as possible.  Feel free to call the clinic you have any questions or concerns. The clinic phone number is [phone].  Please show the [facility] at check-in to the Emergency Department and triage nurse.  Carboplatin injection What is this medicine? CARBOPLATIN (KAR boe pla tin) is a chemotherapy drug. It targets fast dividing cells, like cancer cells, and causes these cells to die. This medicine is used to treat ovarian cancer and many other cancers. This medicine may be used for other purposes; ask your health care provider or pharmacist if you have questions. COMMON BRAND NAME(S): Paraplatin What should I tell my health care provider before I take this medicine? They need to know if you have any of these conditions: -blood disorders -hearing problems -kidney disease -recent or ongoing radiation therapy -an unusual or allergic reaction to carboplatin, cisplatin, other chemotherapy, other medicines, foods, dyes, or preservatives -pregnant or trying to get pregnant -breast-feeding How should I use this medicine? This drug is usually given as an infusion  into a vein. It is administered in a hospital or clinic by a specially trained health care professional. Talk to your pediatrician regarding the use of this medicine in children. Special care may be needed. Overdosage: If you think you have taken too much of this medicine contact a poison control center or emergency room at once. NOTE: This medicine is only for you. Do not share this medicine with others. What if I miss a dose? It is important not to miss a dose. Call your doctor or health care professional if you are unable to keep an appointment. What may interact with this medicine? -medicines for seizures -medicines to increase blood counts like filgrastim, pegfilgrastim, sargramostim -some antibiotics like amikacin, gentamicin, neomycin, streptomycin, tobramycin -vaccines Talk to your doctor or health care professional before taking any of these medicines: -acetaminophen -aspirin -ibuprofen -ketoprofen -naproxen This list may not describe all possible interactions. Give your health care provider a list of all the medicines, herbs, non-prescription drugs, or dietary supplements you use. Also tell them if you smoke, drink alcohol, or use illegal drugs. Some items may interact with your medicine. What should I watch for while using this medicine? Your condition will be monitored carefully while you are receiving this medicine. You will need important blood work done while you are taking this medicine. This drug may make you feel generally unwell. This is not uncommon, as chemotherapy can affect healthy cells as well as cancer cells. Report any side effects. Continue your course of treatment even though you feel ill unless your doctor tells you to stop. In some cases, you may be given additional medicines to help with side effects. Follow all directions for  their use. Call your doctor or health care professional for advice if you get a fever, chills or sore throat, or other symptoms of a cold  or flu. Do not treat yourself. This drug decreases your body's ability to fight infections. Try to avoid being around people who are sick. This medicine may increase your risk to bruise or bleed. Call your doctor or health care professional if you notice any unusual bleeding. Be careful brushing and flossing your teeth or using a toothpick because you may get an infection or bleed more easily. If you have any dental work done, tell your dentist you are receiving this medicine. Avoid taking products that contain aspirin, acetaminophen, ibuprofen, naproxen, or ketoprofen unless instructed by your doctor. These medicines may hide a fever. Do not become pregnant while taking this medicine. Women should inform their doctor if they wish to become pregnant or think they might be pregnant. There is a potential for serious side effects to an unborn child. Talk to your health care professional or pharmacist for more information. Do not breast-feed an infant while taking this medicine. What side effects may I notice from receiving this medicine? Side effects that you should report to your doctor or health care professional as soon as possible: -allergic reactions like skin rash, itching or hives, swelling of the face, lips, or tongue -signs of infection - fever or chills, cough, sore throat, pain or difficulty passing urine -signs of decreased platelets or bleeding - bruising, pinpoint red spots on the skin, black, tarry stools, nosebleeds -signs of decreased red blood cells - unusually weak or tired, fainting spells, lightheadedness -breathing problems -changes in hearing -changes in vision -chest pain -high blood pressure -low blood counts - This drug may decrease the number of white blood cells, red blood cells and platelets. You may be at increased risk for infections and bleeding. -nausea and vomiting -pain, swelling, redness or irritation at the injection site -pain, tingling, numbness in the hands or  feet -problems with balance, talking, walking -trouble passing urine or change in the amount of urine Side effects that usually do not require medical attention (report to your doctor or health care professional if they continue or are bothersome): -hair loss -loss of appetite -metallic taste in the mouth or changes in taste This list may not describe all possible side effects. Call your doctor for medical advice about side effects. You may report side effects to FDA at 1-800-FDA-1088. Where should I keep my medicine? This drug is given in a hospital or clinic and will not be stored at home. NOTE: This sheet is a summary. It may not cover all possible information. If you have questions about this medicine, talk to your doctor, pharmacist, or health care provider.  2018 Elsevier/Gold Standard (2007-08-12 14:38:05)  Gemcitabine injection What is this medicine? GEMCITABINE (jem SIT a been) is a chemotherapy drug. This medicine is used to treat many types of cancer like breast cancer, lung cancer, pancreatic cancer, and ovarian cancer. This medicine may be used for other purposes; ask your health care provider or pharmacist if you have questions. COMMON BRAND NAME(S): Gemzar What should I tell my health care provider before I take this medicine? They need to know if you have any of these conditions: -blood disorders -infection -kidney disease -liver disease -recent or ongoing radiation therapy -an unusual or allergic reaction to gemcitabine, other chemotherapy, other medicines, foods, dyes, or preservatives -pregnant or trying to get pregnant -breast-feeding How should I use this medicine?  This drug is given as an infusion into a vein. It is administered in a hospital or clinic by a specially trained health care professional. Talk to your pediatrician regarding the use of this medicine in children. Special care may be needed. Overdosage: If you think you have taken too much of this  medicine contact a poison control center or emergency room at once. NOTE: This medicine is only for you. Do not share this medicine with others. What if I miss a dose? It is important not to miss your dose. Call your doctor or health care professional if you are unable to keep an appointment. What may interact with this medicine? -medicines to increase blood counts like filgrastim, pegfilgrastim, sargramostim -some other chemotherapy drugs like cisplatin -vaccines Talk to your doctor or health care professional before taking any of these medicines: -acetaminophen -aspirin -ibuprofen -ketoprofen -naproxen This list may not describe all possible interactions. Give your health care provider a list of all the medicines, herbs, non-prescription drugs, or dietary supplements you use. Also tell them if you smoke, drink alcohol, or use illegal drugs. Some items may interact with your medicine. What should I watch for while using this medicine? Visit your doctor for checks on your progress. This drug may make you feel generally unwell. This is not uncommon, as chemotherapy can affect healthy cells as well as cancer cells. Report any side effects. Continue your course of treatment even though you feel ill unless your doctor tells you to stop. In some cases, you may be given additional medicines to help with side effects. Follow all directions for their use. Call your doctor or health care professional for advice if you get a fever, chills or sore throat, or other symptoms of a cold or flu. Do not treat yourself. This drug decreases your body's ability to fight infections. Try to avoid being around people who are sick. This medicine may increase your risk to bruise or bleed. Call your doctor or health care professional if you notice any unusual bleeding. Be careful brushing and flossing your teeth or using a toothpick because you may get an infection or bleed more easily. If you have any dental work done,  tell your dentist you are receiving this medicine. Avoid taking products that contain aspirin, acetaminophen, ibuprofen, naproxen, or ketoprofen unless instructed by your doctor. These medicines may hide a fever. Women should inform their doctor if they wish to become pregnant or think they might be pregnant. There is a potential for serious side effects to an unborn child. Talk to your health care professional or pharmacist for more information. Do not breast-feed an infant while taking this medicine. What side effects may I notice from receiving this medicine? Side effects that you should report to your doctor or health care professional as soon as possible: -allergic reactions like skin rash, itching or hives, swelling of the face, lips, or tongue -low blood counts - this medicine may decrease the number of white blood cells, red blood cells and platelets. You may be at increased risk for infections and bleeding. -signs of infection - fever or chills, cough, sore throat, pain or difficulty passing urine -signs of decreased platelets or bleeding - bruising, pinpoint red spots on the skin, black, tarry stools, blood in the urine -signs of decreased red blood cells - unusually weak or tired, fainting spells, lightheadedness -breathing problems -chest pain -mouth sores -nausea and vomiting -pain, swelling, redness at site where injected -pain, tingling, numbness in the hands or feet -  stomach pain -swelling of ankles, feet, hands -unusual bleeding Side effects that usually do not require medical attention (report to your doctor or health care professional if they continue or are bothersome): -constipation -diarrhea -hair loss -loss of appetite -stomach upset This list may not describe all possible side effects. Call your doctor for medical advice about side effects. You may report side effects to FDA at 1-800-FDA-1088. Where should I keep my medicine? This drug is given in a hospital or  clinic and will not be stored at home. NOTE: This sheet is a summary. It may not cover all possible information. If you have questions about this medicine, talk to your doctor, pharmacist, or health care provider.  2018 Elsevier/Gold Standard (2007-09-16 18:45:54)

## 2016-10-02 NOTE — Progress Notes (Signed)
Pt tolerated Gemzar and Carboplatin well. Pt stable at discharge.

## 2016-10-02 NOTE — Progress Notes (Signed)
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 This showed a possible mass in the left breast. On 07/02/2016 she underwent left diagnostic mammography with tomography and left breast ultrasonography. The breast density was category C. In the left breast upper outer quadrant there was a microlobulated mass which was not directly palpable, although there was slight thickening in the left breast 1:00 position. Targeted ultrasonography confirmed a solid hypoechoic microlobulated mass in the left breast 1:00 radiant 4 cm from the nipple, measuring 0.8 cm.  On 07/03/2016 Jamie [name] underwent biopsy of the left breast mass in question, and this showed ([id number]) invasive ductal carcinoma, grade 2 or 3, with extracellular mucin, estrogen receptor 5% positive with weak staining intensity, progesterone receptor negative, with an MIB-1 of 50%, and HER-2 amplified, the signals ratio being 5.71 and the number per cell 14.55.  Her subsequent history is as detailed below  INTERVAL HISTORY: Jamie [name] is doing well today.  She is here to begin her first dose of Gemcitabine/Carboplatin.  She has had some sleeping issues.  She took Lorazepam last night  and though she had difficulty falling asleep (took maybe 45 minutes), she did fall asleep and slept for 6-6.5 hours.  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    REVIEW OF SYSTEMS: Jamie [name] is doing well and denies fevers, chills, chest pain, palpitations, swelling or any further concerns.  A detailed ROS is otherwise non contributory.    PAST MEDICAL HISTORY: Past Medical History:  Diagnosis Date  . Anxiety   . Arthritis    feet  . Basal cell carcinoma   . Breast cancer ([facility]) 06/2016   left breast  . Eczema   . Genetic testing 08/15/2016   Jamie [name] underwent genetic testing for hereditary cancer syndrome through [employer]'s 43-gene Common Hereditary Cancers Panel. 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 Result report is dated 08/15/2016. Please see genetic counseling documentation from 08/17/2016 for further discussion.  [name] PONV (postoperative nausea and vomiting)     PAST SURGICAL HISTORY: Past Surgical History:  Procedure Laterality Date  . BONE SPUR  2001 AND 1988  . BREAST LUMPECTOMY WITH RADIOACTIVE SEED AND SENTINEL LYMPH NODE BIOPSY Left 07/26/2016   Procedure: BREAST LUMPECTOMY WITH RADIOACTIVE SEED AND SENTINEL LYMPH NODE BIOPSY;  Surgeon: Jamie Klein, MD;  Location: [facility];  Service: General;  Laterality: Left;  . BUNIONECTOMY  10/13  . COLONOSCOPY W/ POLYPECTOMY  08/2007  . DILATION AND CURETTAGE OF UTERUS  2002  . MOHS SURGERY  2010  . PORTACATH PLACEMENT Right 07/26/2016   Procedure: INSERTION PORT-A-CATH;  Surgeon: Jamie Klein, MD;  Location: [facility];  Service: General;  Laterality: Right;    FAMILY HISTORY Family History  Problem Relation Age  of Onset  . Ovarian cancer Mother 61  . Hypertension Father   . Stroke Father   . Heart disease Father   . Breast cancer Maternal Aunt 77        recurred at 103  . Heart disease Paternal Grandmother   . Osteoporosis Sister   . Liver cancer Maternal Uncle 72  The patient's father died at age 37, with some form of skin cancer, most likely melanoma. The patient's mother died at the age of 42 with ovarian cancer, which had been diagnosed a few months prior. The patient had no brothers, 1 sister. A maternal aunt was diagnosed with breast cancer at age 43, recurrent age 52.  GYNECOLOGIC HISTORY:  No LMP recorded. Patient is postmenopausal. Menarche age 69, the patient is GX P0. She stopped having periods approximately age 62. 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 Her husband Jamie [name] worked as a Dance movement psychotherapist for a [employer]. Their last name is pronounced foh-TEE-ah and they tell me it means "burning" in [place]. Their children are adopted. Jamie [name] lives in [place] and works in Press photographer, and Helper lives in [place] and is an Scientist, water quality. The patient has 3 grandchildren. She is not a Ambulance person.    ADVANCED DIRECTIVES: In place   HEALTH MAINTENANCE: Social History  Substance Use Topics  . Smoking status: Never Smoker  . Smokeless tobacco: Never Used  . Alcohol use 0.0 oz/week     Comment: 1-2     Colonoscopy:2009  WGN:FAOZHY 2018  Bone density:April 2016   Allergies  Allergen Reactions  . Codeine Nausea Only and Other (See Comments)    Dizzy  . Sulfamethoxazole Nausea And Vomiting  . Adhesive [Tape]     Current Outpatient Prescriptions  Medication Sig Dispense Refill  . CALCIUM PO Take 500 mg by mouth 2 (two) times daily. Skeletal strength 353m Calium    . cetirizine (ZYRTEC) 10 MG tablet Take 10 mg by mouth daily.    . Cholecalciferol (VITAMIN D3) 1000 units CAPS Take 1 capsule by mouth daily.    . Cyanocobalamin (B-12) 2500 MCG TABS Take by mouth.    . dexamethasone (DECADRON) 4 MG tablet Take 2 tablets (8 mg total) by mouth  daily. Start the day after chemotherapy for 2 days. Take with food. 30 tablet 1  . Lactobacillus (ACIDOPHILUS PROBIOTIC PO) Take by mouth daily.    .Marland Kitchenlidocaine-prilocaine (EMLA) cream Apply one application to port 1-2 hours prior to access. Cover with saran wrap. 30 g 3  . LORazepam (ATIVAN) 0.5 MG tablet Take 1 tablet (0.5 mg total) by mouth at bedtime as needed (Nausea or vomiting). 30 tablet 0  . Multiple Vitamin (MULTIVITAMIN) tablet Take 1 tablet by mouth daily.    . ondansetron (ZOFRAN) 8 MG tablet Take by mouth every 8 (eight) hours as needed.    .Marland KitchenoxyCODONE (OXY IR/ROXICODONE) 5 MG immediate release tablet Take 1-2 tablets (5-10 mg total) by mouth every 6 (six) hours as needed for moderate pain, severe pain or breakthrough pain. (Patient not taking: Reported on 09/18/2016) 20 tablet 0  . prochlorperazine (COMPAZINE) 10 MG tablet Take 10 mg by mouth every 6 (six) hours as needed.    . prochlorperazine (COMPAZINE) 10 MG tablet Take 1 tablet (10 mg total) by mouth every 6 (six) hours as needed (Nausea or vomiting). 30 tablet 1  . SYMBICORT 160-4.5 MCG/ACT inhaler INL 2  PFS ITL BID  1   No current facility-administered medications for this visit.     OBJECTIVE:  Vitals:   10/02/16 0845  BP: 127/66  Pulse: 63  Resp: 20  Temp: 98.3 F (36.8 C)     Body mass index is 24.84 kg/m.    ECOG FS:0 - Asymptomatic GENERAL: Patient is a well appearing female in no acute distress HEENT:  Sclerae anicteric.  PERRL.  Oropharynx clear and moist. No ulcerations or evidence of oropharyngeal candidiasis. Neck is supple.  NODES:  No cervical, supraclavicular, or axillary lymphadenopathy palpated.  BREAST EXAM:  Deferred. LUNGS:  Clear to auscultation bilaterally.  No wheezes or rhonchi. HEART:  Regular rate and rhythm. No murmur appreciated. ABDOMEN:  Soft, nontender.  Positive, normoactive bowel sounds. No organomegaly palpated. MSK:  No focal spinal tenderness to palpation. Full range of motion  bilaterally in the upper extremities. EXTREMITIES:  No peripheral edema.   SKIN:  Very fin papular erythematous rash on left anterior thigh, only about 4-5 papular areas present. No nail dyscrasia. NEURO:  Nonfocal. Well oriented.  Appropriate affect.    LAB RESULTS:  CMP     Component Value Date/Time   NA 141 09/25/2016 0851   K 4.2 09/25/2016 0851   CL 100 08/04/2015 0831   CO2 26 09/25/2016 0851   GLUCOSE 82 09/25/2016 0851   BUN 17.1 09/25/2016 0851   CREATININE 0.8 09/25/2016 0851   CALCIUM 9.5 09/25/2016 0851   PROT 7.0 09/25/2016 0851   ALBUMIN 3.8 09/25/2016 0851   AST 21 09/25/2016 0851   ALT 22 09/25/2016 0851   ALKPHOS 63 09/25/2016 0851   BILITOT 0.28 09/25/2016 0851   GFRNONAA 86 08/04/2015 0831   GFRAA >89 08/04/2015 0831    INo results found for: SPEP, UPEP  Lab Results  Component Value Date   WBC 5.2 10/02/2016   NEUTROABS 2.5 10/02/2016   HGB 12.3 10/02/2016   HCT 37.3 10/02/2016   MCV 92.3 10/02/2016   PLT 359 10/02/2016      Chemistry      Component Value Date/Time   NA 141 09/25/2016 0851   K 4.2 09/25/2016 0851   CL 100 08/04/2015 0831   CO2 26 09/25/2016 0851   BUN 17.1 09/25/2016 0851   CREATININE 0.8 09/25/2016 0851   GLU 73 08/02/2014      Component Value Date/Time   CALCIUM 9.5 09/25/2016 0851   ALKPHOS 63 09/25/2016 0851   AST 21 09/25/2016 0851   ALT 22 09/25/2016 0851   BILITOT 0.28 09/25/2016 0851       No results found for: LABCA2  No components found for: LABCA125  No results for input(s): INR in the last 168 hours.  Urinalysis No results found for: COLORURINE, APPEARANCEUR, LABSPEC, PHURINE, GLUCOSEU, HGBUR, BILIRUBINUR, KETONESUR, PROTEINUR, UROBILINOGEN, NITRITE, LEUKOCYTESUR   STUDIES: Echo and genetics results review  ELIGIBLE FOR AVAILABLE RESEARCH PROTOCOL: no  ASSESSMENT: 61 y.o. Jamie [name], [place] woman status post biopsy of the left breast upper outer quadrant lesion 07/03/2016 showing a clinical T1b  N0, stage 1B invasive ductal carcinoma, grade 2 or 3, estrogen receptor weakly positive at 5%, progesterone receptor negative, but HER-2 strongly amplified, with an MIB-1 of 50%.  (1) status post left lumpectomy with sentinel lymph node sampling 07/26/2016 for a pT1c pN0, stage IA invasive ductal carcinoma, grade 3, with extracellular mucin, and negative margins  (2) chemotherapy will consist of Paclitaxel weekly 12 starting 09/18/2016, discontinued after one cycle due to peripheral neuropathy.  To start Gemcitabine and Carboplatin  On 10/02/2016.    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      PLAN: Sayuri is doing well today.  We are having some issues with getting her insurance to approve Gemciatabine carboplatin and Dr. [name] has to discuss with her insurance company today at 1pm.  Her labs are normal, so she will return to hopefully receive treatment at 130.  She and I reviewed her anti-emetics.  She will return in one week for labs, appt with Dr. [name], and chemotherapy.    She has a good understanding of this plan. She agrees with it. She will call with any problems that may develop before her next visit here.  A total of (30) minutes of face-to-face time was spent with this patient with greater than 50% of that time in counseling and care-coordination.   Gardenia Phlegm, NP   10/02/2016 8:50 AM Medical Oncology and Hematology [facility] [address] [name], [place] [address] Tel. [phone]    Fax.  [phone]

## 2016-10-03 ENCOUNTER — Telehealth: Payer: Self-pay | Admitting: *Deleted

## 2016-10-03 NOTE — Telephone Encounter (Addendum)
-----   Message from Egbert Garibaldi, RN sent at 10/02/2016  4:19 PM EDT ----- Regarding: Dr. Vivi Ferns f/u call  Pt of Dr. [name], first time Gemzar and Carboplatin. Pt tolerated well.   Follow up call on 10/03/2016 with pt reporting no issues- " feel great ".  Pt understands to call if needed.

## 2016-10-05 ENCOUNTER — Telehealth: Payer: Self-pay | Admitting: Medical Oncology

## 2016-10-05 NOTE — Telephone Encounter (Signed)
She cannot sleep -esp day before chemo and she would like something else beside lorazepam as it makes her jittery. Benadryl give her a "hangover".

## 2016-10-08 ENCOUNTER — Telehealth: Payer: Self-pay | Admitting: *Deleted

## 2016-10-08 NOTE — Telephone Encounter (Signed)
This RN spoke with pt per her concerns of sleep aid and constipation.  She states she took ativan post treatment and " felt jittery "  She felt symptom was related to the ativan - note she does get decadron po for post chemo nausea.  Per her prior call to Triage nurse - benadryl was discussed with concern for possible feeling of " having a hangover "  Per this call concerns discussed including bowel issues with pt stating small BM this morning " but it was a lot of work for a small amount "  Plan per above - pt will use benadryl with dosing of pediatric formula so she can take as little as 6.25 mg nightly and move up for appropriate benefit.  Constipation issues discussed and pt will institute use of colace, and dulcolax suppositories.  No further needs at this time.

## 2016-10-09 ENCOUNTER — Telehealth: Payer: Self-pay | Admitting: *Deleted

## 2016-10-09 ENCOUNTER — Ambulatory Visit (HOSPITAL_BASED_OUTPATIENT_CLINIC_OR_DEPARTMENT_OTHER): Payer: Managed Care, Other (non HMO)

## 2016-10-09 ENCOUNTER — Encounter: Payer: Self-pay | Admitting: *Deleted

## 2016-10-09 ENCOUNTER — Ambulatory Visit (HOSPITAL_BASED_OUTPATIENT_CLINIC_OR_DEPARTMENT_OTHER): Payer: Managed Care, Other (non HMO) | Admitting: Oncology

## 2016-10-09 ENCOUNTER — Other Ambulatory Visit (HOSPITAL_BASED_OUTPATIENT_CLINIC_OR_DEPARTMENT_OTHER): Payer: Managed Care, Other (non HMO)

## 2016-10-09 ENCOUNTER — Ambulatory Visit: Payer: Managed Care, Other (non HMO)

## 2016-10-09 VITALS — BP 109/61 | HR 66 | Temp 98.4°F | Resp 19 | Ht 66.0 in | Wt 152.9 lb

## 2016-10-09 DIAGNOSIS — Z5111 Encounter for antineoplastic chemotherapy: Secondary | ICD-10-CM | POA: Diagnosis not present

## 2016-10-09 DIAGNOSIS — Z5112 Encounter for antineoplastic immunotherapy: Secondary | ICD-10-CM

## 2016-10-09 DIAGNOSIS — Z95828 Presence of other vascular implants and grafts: Secondary | ICD-10-CM

## 2016-10-09 DIAGNOSIS — Z170 Estrogen receptor positive status [ER+]: Principal | ICD-10-CM

## 2016-10-09 DIAGNOSIS — C50412 Malignant neoplasm of upper-outer quadrant of left female breast: Secondary | ICD-10-CM

## 2016-10-09 LAB — CBC WITH DIFFERENTIAL/PLATELET
BASO%: 2.4 % — ABNORMAL HIGH (ref 0.0–2.0)
BASOS ABS: 0.1 10*3/uL (ref 0.0–0.1)
EOS%: 5.2 % (ref 0.0–7.0)
Eosinophils Absolute: 0.2 10*3/uL (ref 0.0–0.5)
HEMATOCRIT: 35.4 % (ref 34.8–46.6)
HGB: 12.4 g/dL (ref 11.6–15.9)
LYMPH%: 52.2 % — ABNORMAL HIGH (ref 14.0–49.7)
MCH: 31.2 pg (ref 25.1–34.0)
MCHC: 35 g/dL (ref 31.5–36.0)
MCV: 89.2 fL (ref 79.5–101.0)
MONO#: 0.1 10*3/uL (ref 0.1–0.9)
MONO%: 2.2 % (ref 0.0–14.0)
NEUT#: 1.2 10*3/uL — ABNORMAL LOW (ref 1.5–6.5)
NEUT%: 38 % — AB (ref 38.4–76.8)
Platelets: 247 10*3/uL (ref 145–400)
RBC: 3.97 10*6/uL (ref 3.70–5.45)
RDW: 12.5 % (ref 11.2–14.5)
WBC: 3.1 10*3/uL — ABNORMAL LOW (ref 3.9–10.3)
lymph#: 1.6 10*3/uL (ref 0.9–3.3)

## 2016-10-09 LAB — COMPREHENSIVE METABOLIC PANEL
ALT: 26 U/L (ref 0–55)
AST: 23 U/L (ref 5–34)
Albumin: 3.8 g/dL (ref 3.5–5.0)
Alkaline Phosphatase: 59 U/L (ref 40–150)
Anion Gap: 9 mEq/L (ref 3–11)
BUN: 13.1 mg/dL (ref 7.0–26.0)
CALCIUM: 9.4 mg/dL (ref 8.4–10.4)
CHLORIDE: 105 meq/L (ref 98–109)
CO2: 26 mEq/L (ref 22–29)
Creatinine: 0.8 mg/dL (ref 0.6–1.1)
EGFR: 83 mL/min/{1.73_m2} — AB (ref >90)
Glucose: 75 mg/dl (ref 70–140)
POTASSIUM: 4.1 meq/L (ref 3.5–5.1)
SODIUM: 140 meq/L (ref 136–145)
Total Bilirubin: 0.52 mg/dL (ref 0.20–1.20)
Total Protein: 6.7 g/dL (ref 6.4–8.3)

## 2016-10-09 MED ORDER — SODIUM CHLORIDE 0.9% FLUSH
10.0000 mL | INTRAVENOUS | Status: DC | PRN
  Administered 2016-10-09: 10 mL
  Filled 2016-10-09: qty 10

## 2016-10-09 MED ORDER — PALONOSETRON HCL INJECTION 0.25 MG/5ML
INTRAVENOUS | Status: AC
  Filled 2016-10-09: qty 5

## 2016-10-09 MED ORDER — SODIUM CHLORIDE 0.9% FLUSH
10.0000 mL | INTRAVENOUS | Status: DC | PRN
  Administered 2016-10-09: 10 mL via INTRAVENOUS
  Filled 2016-10-09: qty 10

## 2016-10-09 MED ORDER — SODIUM CHLORIDE 0.9 % IV SOLN
211.8000 mg | Freq: Once | INTRAVENOUS | Status: AC
  Administered 2016-10-09: 210 mg via INTRAVENOUS
  Filled 2016-10-09: qty 21

## 2016-10-09 MED ORDER — DIPHENHYDRAMINE HCL 25 MG PO CAPS
25.0000 mg | ORAL_CAPSULE | Freq: Once | ORAL | Status: AC
  Administered 2016-10-09: 25 mg via ORAL

## 2016-10-09 MED ORDER — SODIUM CHLORIDE 0.9 % IV SOLN: 10.0000 mg | Freq: Once | INTRAVENOUS | Status: DC

## 2016-10-09 MED ORDER — DEXAMETHASONE SODIUM PHOSPHATE 10 MG/ML IJ SOLN
INTRAMUSCULAR | Status: AC
  Filled 2016-10-09: qty 1

## 2016-10-09 MED ORDER — DEXAMETHASONE SODIUM PHOSPHATE 10 MG/ML IJ SOLN
10.0000 mg | Freq: Once | INTRAMUSCULAR | Status: AC
  Administered 2016-10-09: 10 mg via INTRAVENOUS

## 2016-10-09 MED ORDER — SODIUM CHLORIDE 0.9 % IV SOLN
1000.0000 mg/m2 | Freq: Once | INTRAVENOUS | Status: AC
  Administered 2016-10-09: 1786 mg via INTRAVENOUS
  Filled 2016-10-09: qty 46.97

## 2016-10-09 MED ORDER — HEPARIN SOD (PORK) LOCK FLUSH 100 UNIT/ML IV SOLN
500.0000 [IU] | Freq: Once | INTRAVENOUS | Status: AC | PRN
  Administered 2016-10-09: 500 [IU]
  Filled 2016-10-09: qty 5

## 2016-10-09 MED ORDER — PALONOSETRON HCL INJECTION 0.25 MG/5ML
0.2500 mg | Freq: Once | INTRAVENOUS | Status: AC
Start: 2016-10-09 — End: 2016-10-09
  Administered 2016-10-09: 0.25 mg via INTRAVENOUS

## 2016-10-09 MED ORDER — SODIUM CHLORIDE 0.9 % IV SOLN
Freq: Once | INTRAVENOUS | Status: AC
  Administered 2016-10-09: 10:00:00 via INTRAVENOUS

## 2016-10-09 MED ORDER — DIPHENHYDRAMINE HCL 25 MG PO CAPS
ORAL_CAPSULE | ORAL | Status: AC
  Filled 2016-10-09: qty 1

## 2016-10-09 MED ORDER — ACETAMINOPHEN 325 MG PO TABS
ORAL_TABLET | ORAL | Status: AC
  Filled 2016-10-09: qty 2

## 2016-10-09 MED ORDER — TRASTUZUMAB CHEMO 150 MG IV SOLR
6.0000 mg/kg | Freq: Once | INTRAVENOUS | Status: AC
  Administered 2016-10-09: 399 mg via INTRAVENOUS
  Filled 2016-10-09: qty 19

## 2016-10-09 MED ORDER — ACETAMINOPHEN 325 MG PO TABS
650.0000 mg | ORAL_TABLET | Freq: Once | ORAL | Status: AC
Start: 2016-10-09 — End: 2016-10-09
  Administered 2016-10-09: 650 mg via ORAL

## 2016-10-09 NOTE — Patient Instructions (Signed)

## 2016-10-09 NOTE — Telephone Encounter (Signed)
Per MD review proceed with day 8 treatment despite ANC of 1.2

## 2016-10-09 NOTE — Addendum Note (Signed)
Addended by: [name] on: 10/09/2016 09:47 AM   Modules accepted: Orders

## 2016-10-09 NOTE — Patient Instructions (Signed)
[facility] Discharge Instructions for Patients Receiving Chemotherapy  Today you received the following chemotherapy agents:  Herceptin, Gemzar, and Carboplatin.  To help prevent nausea and vomiting after your treatment, we encourage you to take your nausea medication as directed.   If you develop nausea and vomiting that is not controlled by your nausea medication, call the clinic.   BELOW ARE SYMPTOMS THAT SHOULD BE REPORTED IMMEDIATELY:  *FEVER GREATER THAN 100.5 F  *CHILLS WITH OR WITHOUT FEVER  NAUSEA AND VOMITING THAT IS NOT CONTROLLED WITH YOUR NAUSEA MEDICATION  *UNUSUAL SHORTNESS OF BREATH  *UNUSUAL BRUISING OR BLEEDING  TENDERNESS IN MOUTH AND THROAT WITH OR WITHOUT PRESENCE OF ULCERS  *URINARY PROBLEMS  *BOWEL PROBLEMS  UNUSUAL RASH Items with * indicate a potential emergency and should be followed up as soon as possible.  Feel free to call the clinic you have any questions or concerns. The clinic phone number is [phone].  Please show the [place] at check-in to the Emergency Department and triage nurse.

## 2016-10-09 NOTE — Progress Notes (Signed)
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 This showed a possible mass in the left breast. On 07/02/2016 she underwent left diagnostic mammography with tomography and left breast ultrasonography. The breast density was category C. In the left breast upper outer quadrant there was a microlobulated mass which was not directly palpable, although there was slight thickening in the left breast 1:00 position. Targeted ultrasonography confirmed a solid hypoechoic microlobulated mass in the left breast 1:00 radiant 4 cm from the nipple, measuring 0.8 cm.  On 07/03/2016 [name] underwent biopsy of the left breast mass in question, and this showed ([id number]) invasive ductal carcinoma, grade 2 or 3, with extracellular mucin, estrogen receptor 5% positive with weak staining intensity, progesterone receptor negative, with an MIB-1 of 50%, and HER-2 amplified, the signals ratio being 5.71 and the number per cell 14.55.  Her subsequent history is as detailed below  INTERVAL HISTORY: Okla returns today for follow-up of her HER-2/neu positive breast cancer. Today is day 8 cycle 1 of her gemcitabine and carboplatin, which she may be able  to receive weekly and/or if not I will receive on days 1 and 8 of each 21 day cycle. This is her third of 12 planned chemotherapy treatments  She did remarkably well with the first cycle. She did have mild headaches. She had significant constipation despite being on a variety of over-the-counter and food remedies. She has gotten some Colace and a suppository just in case this happens again.  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  REVIEW OF SYSTEMS: Shaquera has not yet started to lose her hair. She does describe herself is mildly to moderately fatigued. She continues to have insomnia problems. She is using Benadryl, we'll try melatonin, and may give lorazepam a second try. She has a basal cell that needs attention she says. She is anxious but not depressed. She is having hot flashes. A detailed review of systems today was otherwise stable  PAST MEDICAL HISTORY: Past Medical History:  Diagnosis Date  . Anxiety   . Arthritis    feet  . Basal cell carcinoma   . Breast cancer ([place]) 06/2016   left breast  . Eczema   . Genetic testing 08/15/2016   Ms. Banfield underwent genetic testing for hereditary cancer syndrome through [facility]'s 43-gene Common Hereditary Cancers Panel. Ms. 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 Result report is dated 08/15/2016. Please see genetic counseling documentation from 08/17/2016 for further discussion.  [name] PONV (postoperative nausea and vomiting)     PAST SURGICAL HISTORY: Past Surgical History:  Procedure Laterality Date  . BONE SPUR  2001 AND 1988  . BREAST LUMPECTOMY WITH RADIOACTIVE SEED AND SENTINEL LYMPH NODE BIOPSY Left 07/26/2016   Procedure: BREAST LUMPECTOMY WITH RADIOACTIVE SEED AND SENTINEL LYMPH NODE BIOPSY;  Surgeon: Dorris Fetch  Barry Dienes, MD;  Location: [facility];  Service: General;  Laterality: Left;  . BUNIONECTOMY  10/13  . COLONOSCOPY W/ POLYPECTOMY   08/2007  . DILATION AND CURETTAGE OF UTERUS  2002  . MOHS SURGERY  2010  . PORTACATH PLACEMENT Right 07/26/2016   Procedure: INSERTION PORT-A-CATH;  Surgeon: [name], MD;  Location: [facility];  Service: General;  Laterality: Right;    FAMILY HISTORY Family History  Problem Relation Age of Onset  . Ovarian cancer Mother 27  . Hypertension Father   . Stroke Father   . Heart disease Father   . Breast cancer Maternal Aunt 77       recurred at 35  . Heart disease Paternal Grandmother   . Osteoporosis Sister   . Liver cancer Maternal Uncle 85  The patient's father died at age 33, with some form of skin cancer, most likely melanoma. The patient's mother died at the age of 26 with ovarian cancer, which had been diagnosed a few months prior. The patient had no brothers, 1 sister. A maternal aunt was diagnosed with breast cancer at age 78, recurrent age 20.  GYNECOLOGIC HISTORY:  No LMP recorded. Patient is postmenopausal. Menarche age 59, the patient is GX P0. She stopped having periods approximately age 61. 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 Her husband [name] worked as a Dance movement psychotherapist for a [employer]. Their last name is pronounced foh-TEE-ah and they tell me it means "burning" in [place]. Their children are adopted. [name] lives in [place] and works in Press photographer, and [place] lives in [place] and is an Scientist, water quality. The patient has 3 grandchildren. She is not a Ambulance person.    ADVANCED DIRECTIVES: In place   HEALTH MAINTENANCE: Social History  Substance Use Topics  . Smoking status: Never Smoker  . Smokeless tobacco: Never Used  . Alcohol use 0.0 oz/week     Comment: 1-2     Colonoscopy:2009  MGN:OIBBCW 2018  Bone density:April 2016   Allergies  Allergen Reactions  . Codeine Nausea Only and Other (See Comments)    Dizzy  . Sulfamethoxazole Nausea And Vomiting  .  Adhesive [Tape]     Current Outpatient Prescriptions  Medication Sig Dispense Refill  . CALCIUM PO Take 500 mg by mouth 2 (two) times daily. Skeletal strength 362m Calium    . cetirizine (ZYRTEC) 10 MG tablet Take 10 mg by mouth daily.    . Cholecalciferol (VITAMIN D3) 1000 units CAPS Take 1 capsule by mouth daily.    . Cyanocobalamin (B-12) 2500 MCG TABS Take by mouth.    . dexamethasone (DECADRON) 4 MG tablet Take 2 tablets (8 mg total) by mouth daily. Start the day after chemotherapy for 2 days. Take with food. 30 tablet 1  . Lactobacillus (ACIDOPHILUS PROBIOTIC PO) Take by mouth daily.    .Marland Kitchenlidocaine-prilocaine (EMLA) cream Apply one application to port 1-2 hours prior to access. Cover with saran wrap. 30 g 3  . LORazepam (ATIVAN) 0.5 MG tablet Take 1 tablet (0.5 mg total) by mouth at bedtime as needed (Nausea or vomiting). 30 tablet 0  . Multiple Vitamin (MULTIVITAMIN) tablet Take 1 tablet by mouth daily.    . ondansetron (ZOFRAN) 8 MG tablet Take by mouth every 8 (eight) hours as needed.    .Marland KitchenoxyCODONE (OXY IR/ROXICODONE) 5 MG immediate release tablet Take 1-2 tablets (5-10 mg total)  by mouth every 6 (six) hours as needed for moderate pain, severe pain or breakthrough pain. (Patient not taking: Reported on 09/18/2016) 20 tablet 0  . prochlorperazine (COMPAZINE) 10 MG tablet Take 10 mg by mouth every 6 (six) hours as needed.    . prochlorperazine (COMPAZINE) 10 MG tablet Take 1 tablet (10 mg total) by mouth every 6 (six) hours as needed (Nausea or vomiting). 30 tablet 1  . SYMBICORT 160-4.5 MCG/ACT inhaler INL 2 PFS ITL BID  1   No current facility-administered medications for this visit.     OBJECTIVE: Middle-aged white woman in no acute distress Vitals:   10/09/16 0847  BP: 109/61  Pulse: 66  Resp: 19  Temp: 98.4 F (36.9 C)     Body mass index is 24.68 kg/m.    ECOG FS:1 - Symptomatic but completely ambulatory  Sclerae unicteric, pupils round and equal Oropharynx clear and  moist No cervical or supraclavicular adenopathy Lungs no rales or rhonchi Heart regular rate and rhythm Abd soft, nontender, positive bowel sounds MSK no focal spinal tenderness, no upper extremity lymphedema Neuro: nonfocal, well oriented, appropriate affect Breasts: the right breast is unremarkable. The left breast is status post lumpectomy. The cosmetic result is excellent. There is no evidence of local recurrence. Both axillae are benign.   LAB RESULTS:  CMP     Component Value Date/Time   NA 141 10/02/2016 0745   K 4.0 10/02/2016 0745   CL 100 08/04/2015 0831   CO2 28 10/02/2016 0745   GLUCOSE 89 10/02/2016 0745   BUN 12.6 10/02/2016 0745   CREATININE 0.8 10/02/2016 0745   CALCIUM 9.5 10/02/2016 0745   PROT 6.9 10/02/2016 0745   ALBUMIN 3.8 10/02/2016 0745   AST 19 10/02/2016 0745   ALT 21 10/02/2016 0745   ALKPHOS 62 10/02/2016 0745   BILITOT 0.29 10/02/2016 0745   GFRNONAA 86 08/04/2015 0831   GFRAA >89 08/04/2015 0831    INo results found for: SPEP, UPEP  Lab Results  Component Value Date   WBC 3.1 (L) 10/09/2016   NEUTROABS 1.2 (L) 10/09/2016   HGB 12.4 10/09/2016   HCT 35.4 10/09/2016   MCV 89.2 10/09/2016   PLT 247 10/09/2016      Chemistry      Component Value Date/Time   NA 141 10/02/2016 0745   K 4.0 10/02/2016 0745   CL 100 08/04/2015 0831   CO2 28 10/02/2016 0745   BUN 12.6 10/02/2016 0745   CREATININE 0.8 10/02/2016 0745   GLU 73 08/02/2014      Component Value Date/Time   CALCIUM 9.5 10/02/2016 0745   ALKPHOS 62 10/02/2016 0745   AST 19 10/02/2016 0745   ALT 21 10/02/2016 0745   BILITOT 0.29 10/02/2016 0745       No results found for: LABCA2  No components found for: LABCA125  No results for input(s): INR in the last 168 hours.  Urinalysis No results found for: COLORURINE, APPEARANCEUR, LABSPEC, PHURINE, GLUCOSEU, HGBUR, BILIRUBINUR, KETONESUR, PROTEINUR, UROBILINOGEN, NITRITE, LEUKOCYTESUR   STUDIES: Repeat echocardiogram  scheduled for 10/22/2016  ELIGIBLE FOR AVAILABLE RESEARCH PROTOCOL: no  ASSESSMENT: 61 y.o. Jule Ser, [place] woman status post biopsy of the left breast upper outer quadrant lesion 07/03/2016 showing a clinical T1b N0, stage 1B invasive ductal carcinoma, grade 2 or 3, estrogen receptor weakly positive at 5%, progesterone receptor negative, but HER-2 strongly amplified, with an MIB-1 of 50%.  (1) status post left lumpectomy with sentinel lymph node sampling 07/26/2016 for a  pT1c pN0, stage IA invasive ductal carcinoma, grade 3, with extracellular mucin, and negative margins  (2) chemotherapy will consist of Paclitaxel weekly 12 starting 09/18/2016, discontinued after one cycle due to peripheral neuropathy.   (a) Gemcitabine and Carboplatin started 10/02/2016.    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      PLAN: Lucindy did generally well with her first cycle of carboplatin/Gemzar. She will receive the day 8 treatment today.  Her white cell count is already begun to drop. I suspect she will not be able to receive a treatment next week. In that case we will probably scheduleher to be treated days 1 and 8 of each 21 day cycle until she completes a total of 12 treatments counting also her initial taxanes treatment.  Ondansetron can cause both constipation and headache. I suggested she stay to prochlorperazine for nausea control  She is very likely to lose her hair. She already has multiple options in place, she tells me.  She is going to give  melatonin and prochlorperazine a second chance as far as sleep is concerned. She will also try to not to drink liquids within 2 hours of going to bed. She will continue on Benadryl  Otherwise she will see me again in a week. She knows to call for any problems that may develop before that visit.  [name]  10/09/2016 8:50 AM Medical Oncology and Hematology [facility] [address] [place], [place] [address] Tel. [phone]    Fax. [phone]

## 2016-10-12 ENCOUNTER — Telehealth: Payer: Self-pay | Admitting: *Deleted

## 2016-10-12 NOTE — Telephone Encounter (Signed)
This RN spoke with pt- she will monitor and call if needed.  No other questions at this time.

## 2016-10-12 NOTE — Telephone Encounter (Signed)
Patient called with complaint of "High rectal irritation.  Possibly internal after using glycerine suppository to make bowels move easier.  My bowels get very formed and hard after chemotherapy. move daily but were getting hard so I used suppository.  My bowels are moving well and have moved several times since I used the suppository.  No history of hemorhoids.  No abdominal pain, cramps or bloating, just rectal irritation."  Nursing instructions provided at time of call.  Try tucks pads or sitz bath.  Use luke warm water or add baking soda or epsom salt to sitz for relief.  Also provided [facility] Triage guidelines for daily colace use instead of suppositories to avoid any suppository side effects.

## 2016-10-16 ENCOUNTER — Ambulatory Visit (HOSPITAL_BASED_OUTPATIENT_CLINIC_OR_DEPARTMENT_OTHER): Payer: Managed Care, Other (non HMO) | Admitting: Oncology

## 2016-10-16 ENCOUNTER — Ambulatory Visit (HOSPITAL_BASED_OUTPATIENT_CLINIC_OR_DEPARTMENT_OTHER): Payer: Managed Care, Other (non HMO)

## 2016-10-16 ENCOUNTER — Other Ambulatory Visit (HOSPITAL_BASED_OUTPATIENT_CLINIC_OR_DEPARTMENT_OTHER): Payer: Managed Care, Other (non HMO)

## 2016-10-16 VITALS — BP 117/64 | HR 70 | Temp 98.1°F | Resp 18 | Ht 66.0 in | Wt 153.6 lb

## 2016-10-16 DIAGNOSIS — Z170 Estrogen receptor positive status [ER+]: Secondary | ICD-10-CM | POA: Diagnosis not present

## 2016-10-16 DIAGNOSIS — C50412 Malignant neoplasm of upper-outer quadrant of left female breast: Secondary | ICD-10-CM

## 2016-10-16 DIAGNOSIS — Z5189 Encounter for other specified aftercare: Secondary | ICD-10-CM

## 2016-10-16 LAB — CBC WITH DIFFERENTIAL/PLATELET
BASO%: 0.5 % (ref 0.0–2.0)
BASOS ABS: 0 10*3/uL (ref 0.0–0.1)
EOS ABS: 0.1 10*3/uL (ref 0.0–0.5)
EOS%: 5.7 % (ref 0.0–7.0)
HCT: 34.4 % — ABNORMAL LOW (ref 34.8–46.6)
HGB: 11.6 g/dL (ref 11.6–15.9)
LYMPH%: 71.6 % — AB (ref 14.0–49.7)
MCH: 30.5 pg (ref 25.1–34.0)
MCHC: 33.7 g/dL (ref 31.5–36.0)
MCV: 90.5 fL (ref 79.5–101.0)
MONO#: 0 10*3/uL — ABNORMAL LOW (ref 0.1–0.9)
MONO%: 0 % (ref 0.0–14.0)
NEUT%: 22.2 % — ABNORMAL LOW (ref 38.4–76.8)
NEUTROS ABS: 0.4 10*3/uL — AB (ref 1.5–6.5)
RBC: 3.8 10*6/uL (ref 3.70–5.45)
RDW: 11.9 % (ref 11.2–14.5)
WBC: 1.9 10*3/uL — AB (ref 3.9–10.3)
lymph#: 1.4 10*3/uL (ref 0.9–3.3)
nRBC: 0 % (ref 0–0)

## 2016-10-16 LAB — COMPREHENSIVE METABOLIC PANEL
ALT: 62 U/L — ABNORMAL HIGH (ref 0–55)
ANION GAP: 9 meq/L (ref 3–11)
AST: 35 U/L — ABNORMAL HIGH (ref 5–34)
Albumin: 3.8 g/dL (ref 3.5–5.0)
Alkaline Phosphatase: 61 U/L (ref 40–150)
BUN: 11.1 mg/dL (ref 7.0–26.0)
CHLORIDE: 102 meq/L (ref 98–109)
CO2: 29 meq/L (ref 22–29)
Calcium: 9.6 mg/dL (ref 8.4–10.4)
Creatinine: 0.8 mg/dL (ref 0.6–1.1)
EGFR: 78 mL/min/{1.73_m2} — AB (ref >90)
Glucose: 85 mg/dl (ref 70–140)
Potassium: 4.4 mEq/L (ref 3.5–5.1)
SODIUM: 141 meq/L (ref 136–145)
Total Bilirubin: 0.4 mg/dL (ref 0.20–1.20)
Total Protein: 6.8 g/dL (ref 6.4–8.3)

## 2016-10-16 MED ORDER — TBO-FILGRASTIM 300 MCG/0.5ML ~~LOC~~ SOSY
300.0000 ug | PREFILLED_SYRINGE | Freq: Once | SUBCUTANEOUS | Status: AC
  Administered 2016-10-16: 300 ug via SUBCUTANEOUS
  Filled 2016-10-16: qty 0.5

## 2016-10-16 NOTE — Progress Notes (Signed)
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 This showed a possible mass in the left breast. On 07/02/2016 she underwent left diagnostic mammography with tomography and left breast ultrasonography. The breast density was category C. In the left breast upper outer quadrant there was a microlobulated mass which was not directly palpable, although there was slight thickening in the left breast 1:00 position. Targeted ultrasonography confirmed a solid hypoechoic microlobulated mass in the left breast 1:00 radiant 4 cm from the nipple, measuring 0.8 cm.  On 07/03/2016 Jamie [name] underwent biopsy of the left breast mass in question, and this showed ([id number]) invasive ductal carcinoma, grade 2 or 3, with extracellular mucin, estrogen receptor 5% positive with weak staining intensity, progesterone receptor negative, with an MIB-1 of 50%, and HER-2 amplified, the signals ratio being 5.71 and the number per cell 14.55.  Her subsequent history is as detailed below  INTERVAL HISTORY: Jamie [name] returns today for follow-up of her HER-2/neu positive breast cancer accompanied by her husband. She is scheduled to receive her third dose of  carboplatin and gemcitabine of 11 planned. However as noted below her counts today are low and we will need to postpone her treatment.   REVIEW OF SYSTEMS: Jamie [name] still has not lost her hair, which is very favorable. She did have significant constipation and when she used a suppository she had some burning. She "took care of that problem" with magnesium and other "non-medications" and yesterday she had 2 normal bowel movements. She is sleeping better using Benadryl and melatonin. She does describe herself is mildly fatigued. She has occasional headaches. She feels anxious but not depressed. She has some hot flashes. Aside from these issues a detailed review of systems today was stable  PAST MEDICAL HISTORY: Past Medical History:  Diagnosis Date  . Anxiety   . Arthritis    feet  . Basal cell carcinoma   . Breast cancer ([place]) 06/2016   left breast  . Eczema   . Genetic testing 08/15/2016   Ms. Fryberger underwent genetic testing for hereditary cancer syndrome through [facility]'s 43-gene Common Hereditary Cancers Panel. Ms. 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 Result report is dated 08/15/2016. Please see genetic counseling documentation from 08/17/2016 for further discussion.  [name] PONV (postoperative nausea and vomiting)     PAST SURGICAL HISTORY: Past Surgical History:  Procedure Laterality Date  . BONE SPUR  2001 AND 1988  . BREAST LUMPECTOMY WITH RADIOACTIVE SEED AND SENTINEL LYMPH NODE BIOPSY Left 07/26/2016   Procedure: BREAST LUMPECTOMY WITH RADIOACTIVE SEED AND SENTINEL LYMPH NODE BIOPSY;  Surgeon: [name], MD;  Location: [facility];  Service: General;  Laterality: Left;  . BUNIONECTOMY  10/13  . COLONOSCOPY W/ POLYPECTOMY  08/2007  . DILATION AND CURETTAGE OF UTERUS  2002  .  MOHS SURGERY  2010  . PORTACATH PLACEMENT Right 07/26/2016   Procedure: INSERTION PORT-A-CATH;  Surgeon: [name],  MD;  Location: [facility];  Service: General;  Laterality: Right;    FAMILY HISTORY Family History  Problem Relation Age of Onset  . Ovarian cancer Mother 52  . Hypertension Father   . Stroke Father   . Heart disease Father   . Breast cancer Maternal Aunt 77       recurred at 10  . Heart disease Paternal Grandmother   . Osteoporosis Sister   . Liver cancer Maternal Uncle 93  The patient's father died at age 65, with some form of skin cancer, most likely melanoma. The patient's mother died at the age of 66 with ovarian cancer, which had been diagnosed a few months prior. The patient had no brothers, 1 sister. A maternal aunt was diagnosed with breast cancer at age 56, recurrent age 37.  GYNECOLOGIC HISTORY:  No LMP recorded. Patient is postmenopausal. Menarche age 48, the patient is GX P0. She stopped having periods approximately age 34. 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 Her husband Jamie [name] worked as a Dance movement psychotherapist for a [employer]. Their last name is pronounced foh-TEE-ah and they tell me it means "burning" in [place]. Their children are adopted. Jamie [name] lives in [place] and works in Press photographer, and [place] lives in [place] and is an Scientist, water quality. The patient has 3 grandchildren. She is not a Ambulance person.    ADVANCED DIRECTIVES: In place   HEALTH MAINTENANCE: Social History  Substance Use Topics  . Smoking status: Never Smoker  . Smokeless tobacco: Never Used  . Alcohol use 0.0 oz/week     Comment: 1-2     Colonoscopy:2009  BOF:[facility] 2018  Bone density:April 2016   Allergies  Allergen Reactions  . Codeine Nausea Only and Other (See Comments)    Dizzy  . Sulfamethoxazole Nausea And Vomiting  . Adhesive [Tape]     Current Outpatient Prescriptions  Medication Sig Dispense Refill  . CALCIUM PO Take 500 mg by mouth 2 (two) times daily. Skeletal strength 376m  Calium    . cetirizine (ZYRTEC) 10 MG tablet Take 10 mg by mouth daily.    . Cholecalciferol (VITAMIN D3) 1000 units CAPS Take 1 capsule by mouth daily.    . Cyanocobalamin (B-12) 2500 MCG TABS Take by mouth.    . dexamethasone (DECADRON) 4 MG tablet Take 2 tablets (8 mg total) by mouth daily. Start the day after chemotherapy for 2 days. Take with food. 30 tablet 1  . Lactobacillus (ACIDOPHILUS PROBIOTIC PO) Take by mouth daily.    .Marland Kitchenlidocaine-prilocaine (EMLA) cream Apply one application to port 1-2 hours prior to access. Cover with saran wrap. 30 g 3  . LORazepam (ATIVAN) 0.5 MG tablet Take 1 tablet (0.5 mg total) by mouth at bedtime as needed (Nausea or vomiting). 30 tablet 0  . Multiple Vitamin (MULTIVITAMIN) tablet Take 1 tablet by mouth daily.    . ondansetron (ZOFRAN) 8 MG tablet Take by mouth every 8 (eight) hours as needed.    .Marland KitchenoxyCODONE (OXY IR/ROXICODONE) 5 MG immediate release tablet Take 1-2 tablets (5-10 mg total) by mouth every 6 (six) hours as needed for moderate pain, severe pain or breakthrough pain. (Patient not taking: Reported on 09/18/2016) 20 tablet 0  . prochlorperazine (COMPAZINE) 10 MG tablet Take 10 mg by mouth  every 6 (six) hours as needed.    . prochlorperazine (COMPAZINE) 10 MG tablet Take 1 tablet (10 mg total) by mouth every 6 (six) hours as needed (Nausea or vomiting). 30 tablet 1  . SYMBICORT 160-4.5 MCG/ACT inhaler INL 2 PFS ITL BID  1   No current facility-administered medications for this visit.     OBJECTIVE: Middle-aged white woman Who appears well  Vitals:   10/16/16 0846  BP: 117/64  Pulse: 70  Resp: 18  Temp: 98.1 F (36.7 C)     Body mass index is 24.79 kg/m.    ECOG FS:1 - Symptomatic but completely ambulatory  Sclerae unicteric, EOMs intact Oropharynx clear and moist No cervical or supraclavicular adenopathy Lungs no rales or rhonchi Heart regular rate and rhythm Abd soft, nontender, positive bowel sounds MSK no focal spinal tenderness,  no upper extremity lymphedema Neuro: nonfocal, well oriented, appropriate affect Breasts: The right breast is benign. The left breast is status post lumpectomy. There is no evidence of residual or recurrent disease. Both axillae are benign.  LAB RESULTS:  CMP     Component Value Date/Time   NA 140 10/09/2016 0753   K 4.1 10/09/2016 0753   CL 100 08/04/2015 0831   CO2 26 10/09/2016 0753   GLUCOSE 75 10/09/2016 0753   BUN 13.1 10/09/2016 0753   CREATININE 0.8 10/09/2016 0753   CALCIUM 9.4 10/09/2016 0753   PROT 6.7 10/09/2016 0753   ALBUMIN 3.8 10/09/2016 0753   AST 23 10/09/2016 0753   ALT 26 10/09/2016 0753   ALKPHOS 59 10/09/2016 0753   BILITOT 0.52 10/09/2016 0753   GFRNONAA 86 08/04/2015 0831   GFRAA >89 08/04/2015 0831    INo results found for: SPEP, UPEP  Lab Results  Component Value Date   WBC 3.1 (L) 10/09/2016   NEUTROABS 1.2 (L) 10/09/2016   HGB 12.4 10/09/2016   HCT 35.4 10/09/2016   MCV 89.2 10/09/2016   PLT 247 10/09/2016      Chemistry      Component Value Date/Time   NA 140 10/09/2016 0753   K 4.1 10/09/2016 0753   CL 100 08/04/2015 0831   CO2 26 10/09/2016 0753   BUN 13.1 10/09/2016 0753   CREATININE 0.8 10/09/2016 0753   GLU 73 08/02/2014      Component Value Date/Time   CALCIUM 9.4 10/09/2016 0753   ALKPHOS 59 10/09/2016 0753   AST 23 10/09/2016 0753   ALT 26 10/09/2016 0753   BILITOT 0.52 10/09/2016 0753       No results found for: LABCA2  No components found for: LABCA125  No results for input(s): INR in the last 168 hours.  Urinalysis No results found for: COLORURINE, APPEARANCEUR, LABSPEC, PHURINE, GLUCOSEU, HGBUR, BILIRUBINUR, KETONESUR, PROTEINUR, UROBILINOGEN, NITRITE, LEUKOCYTESUR   STUDIES: Repeat echocardiogram scheduled for 10/22/2016  ELIGIBLE FOR AVAILABLE RESEARCH PROTOCOL: no  ASSESSMENT: 61 y.o. [place] Ser, [place] woman status post biopsy of the left breast upper outer quadrant lesion 07/03/2016 showing a  clinical T1b N0, stage 1B invasive ductal carcinoma, grade 2 or 3, estrogen receptor weakly positive at 5%, progesterone receptor negative, but HER-2 strongly amplified, with an MIB-1 of 50%.  (1) status post left lumpectomy with sentinel lymph node sampling 07/26/2016 for a pT1c pN0, stage IA invasive ductal carcinoma, grade 3, with extracellular mucin, and negative margins  (2) chemotherapy will consist of Paclitaxel weekly 12 starting 09/18/2016, discontinued after one cycle due to peripheral neuropathy.   (a) Gemcitabine and Carboplatin started 10/02/2016, to  be repeated days 1 and 8 of each 21 day cycle to a total of 11 doses (5-1/2 cycles).  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      PLAN: Haiden is tolerating her treatment well, but her counts are not going to allow [place] to treat her on a weekly schedule. She will receive day one and day 8 treatments and day 15 she will be "off".  I have added Neupogen 2 days to make sure she can get treated next day 1, which will be a week from today.  She will see me again on June 19, she knows to call for any problems that may develop before her next visit here. [name]  10/16/2016 8:47 AM Medical Oncology and Hematology [facility] [address] [place], [place] [address] Tel. [phone]    Fax. [phone]

## 2016-10-16 NOTE — Patient Instructions (Signed)
Neutropenia Neutropenia is a condition that occurs when you have a lower-than-normal level of a type of white blood cell (neutrophil) in your body. Neutrophils are made in the spongy center of large bones (bone marrow) and they fight infections. Neutrophils are your body's main defense against bacterial and fungal infections. The fewer neutrophils you have and the longer your body remains without them, the greater your risk of getting a severe infection. What are the causes? This condition can occur if your body uses up or destroys neutrophils faster than your bone marrow can make them. This problem may happen because of:  Bacterial or fungal infection.  Allergic disorders.  Reactions to some medicines.  Autoimmune disease.  An enlarged spleen. This condition can also occur if your bone marrow does not produce enough neutrophils. This problem may be caused by:  Cancer.  Cancer treatments, such as radiation or chemotherapy.  Viral infections.  Medicines, such as phenytoin.  Vitamin B12 deficiency.  Diseases of the bone marrow.  Environmental toxins, such as insecticides. What are the signs or symptoms? This condition does not usually cause symptoms. If symptoms are present, they are usually caused by an underlying infection. Symptoms of an infection may include:  Fever.  Chills.  Swollen glands.  Oral or anal ulcers.  Cough and shortness of breath.  Rash.  Skin infection.  Fatigue. How is this diagnosed? Your health care provider may suspect neutropenia if you have:  A condition that may cause neutropenia.  Symptoms of infection, especially fever.  Frequent and unusual infections. You will have a medical history and physical exam. Tests will also be done, such as:  A complete blood count (CBC).  A procedure to collect a sample of bone marrow for examination (bone marrow biopsy).  A chest X-ray.  A urine culture.  A blood culture. How is this  treated? Treatment depends on the underlying cause and severity of your condition. Mild neutropenia may not require treatment. Treatment may include medicines, such as:  Antibiotic medicine given through an IV tube.  Antiviral medicines.  Antifungal medicines.  A medicine to increase neutrophil production (colony-stimulating factor). You may get this drug through an IV tube or by injection.  Steroids given through an IV tube. If an underlying condition is causing neutropenia, you may need treatment for that condition. If medicines you are taking are causing neutropenia, your health care provider may have you stop taking those medicines. Follow these instructions at home: Medicines   Take over-the-counter and prescription medicines only as told by your health care provider.  Get a seasonal flu shot (influenza vaccine). Lifestyle   Do not eat unpasteurized foods.Do not eat unwashed raw fruits or vegetables.  Avoid exposure to groups of people or children.  Avoid being around people who are sick.  Avoid being around dirt or dust, such as in construction areas or gardens.  Do not provide direct care for pets. Avoid animal droppings. Do not clean litter boxes and bird cages. Hygiene    Bathe daily.  Clean the area between the genitals and the anus (perineal area) after you urinate or have a bowel movement. If you are female, wipe from front to back.  Brush your teeth with a soft toothbrush before and after meals.  Do not use a razor that has a blade. Use an electric razor to remove hair.  Wash your hands often. Make sure others who come in contact with you also wash their hands. If soap and water are not available,  use hand sanitizer. General instructions   Do not have sex unless your health care provider has approved.  Take actions to avoid cuts and burns. For example:  Be cautious when you use knives. Always cut away from yourself.  Keep knives in protective sheaths or  guards when not in use.  Use oven mitts when you cook with a hot stove, oven, or grill.  Stand a safe distance away from open fires.  Avoid people who received a vaccine in the past 30 days if that vaccine contained a live version of the germ (live vaccine). You should not get a live vaccine. Common live vaccines are varicella, measles, mumps, and rubella.  Do not share food utensils.  Do not use tampons, enemas, or rectal suppositories unless your health care provider has approved.  Keep all appointments as told by your health care provider. This is important. Contact a health care provider if:  You have a fever.  You have chills or you start to shake.  You have:  A sore throat.  A warm, red, or tender area on your skin.  A cough.  Frequent or painful urination.  Vaginal discharge or itching.  You develop:  Sores in your mouth or anus.  Swollen lymph nodes.  Red streaks on the skin.  A rash.  You feel:  Nauseous or you vomit.  Very fatigued.  Short of breath. This information is not intended to replace advice given to you by your health care provider. Make sure you discuss any questions you have with your health care provider. Document Released: 10/27/2001 Document Revised: 10/13/2015 Document Reviewed: 11/17/2014 Elsevier Interactive Patient Education  2017 [employer].

## 2016-10-17 ENCOUNTER — Ambulatory Visit (HOSPITAL_BASED_OUTPATIENT_CLINIC_OR_DEPARTMENT_OTHER): Payer: Managed Care, Other (non HMO)

## 2016-10-17 VITALS — BP 118/61 | HR 85 | Temp 97.4°F | Resp 20

## 2016-10-17 DIAGNOSIS — Z5189 Encounter for other specified aftercare: Secondary | ICD-10-CM | POA: Diagnosis not present

## 2016-10-17 DIAGNOSIS — Z170 Estrogen receptor positive status [ER+]: Principal | ICD-10-CM

## 2016-10-17 DIAGNOSIS — C50412 Malignant neoplasm of upper-outer quadrant of left female breast: Secondary | ICD-10-CM | POA: Diagnosis not present

## 2016-10-17 MED ORDER — TBO-FILGRASTIM 300 MCG/0.5ML ~~LOC~~ SOSY
300.0000 ug | PREFILLED_SYRINGE | Freq: Once | SUBCUTANEOUS | Status: AC
  Administered 2016-10-17: 300 ug via SUBCUTANEOUS
  Filled 2016-10-17: qty 0.5

## 2016-10-17 NOTE — Patient Instructions (Signed)
Tbo-Filgrastim injection What is this medicine? TBO-FILGRASTIM (T B O fil GRA stim) is a granulocyte colony-stimulating factor that stimulates the growth of neutrophils, a type of white blood cell important in the body's fight against infection. It is used to reduce the incidence of fever and infection in patients with certain types of cancer who are receiving chemotherapy that affects the bone marrow. This medicine may be used for other purposes; ask your health care provider or pharmacist if you have questions. COMMON BRAND NAME(S): Granix What should I tell my health care provider before I take this medicine? They need to know if you have any of these conditions: -bone scan or tests planned -kidney disease -sickle cell anemia -an unusual or allergic reaction to tbo-filgrastim, filgrastim, pegfilgrastim, other medicines, foods, dyes, or preservatives -pregnant or trying to get pregnant -breast-feeding How should I use this medicine? This medicine is for injection under the skin. If you get this medicine at home, you will be taught how to prepare and give this medicine. Refer to the Instructions for Use that come with your medication packaging. Use exactly as directed. Take your medicine at regular intervals. Do not take your medicine more often than directed. It is important that you put your used needles and syringes in a special sharps container. Do not put them in a trash can. If you do not have a sharps container, call your pharmacist or healthcare provider to get one. Talk to your pediatrician regarding the use of this medicine in children. Special care may be needed. Overdosage: If you think you have taken too much of this medicine contact a poison control center or emergency room at once. NOTE: This medicine is only for you. Do not share this medicine with others. What if I miss a dose? It is important not to miss your dose. Call your doctor or health care professional if you miss a  dose. What may interact with this medicine? This medicine may interact with the following medications: -medicines that may cause a release of neutrophils, such as lithium This list may not describe all possible interactions. Give your health care provider a list of all the medicines, herbs, non-prescription drugs, or dietary supplements you use. Also tell them if you smoke, drink alcohol, or use illegal drugs. Some items may interact with your medicine. What should I watch for while using this medicine? You may need blood work done while you are taking this medicine. What side effects may I notice from receiving this medicine? Side effects that you should report to your doctor or health care professional as soon as possible: -allergic reactions like skin rash, itching or hives, swelling of the face, lips, or tongue -blood in the urine -dark urine -dizziness -fast heartbeat -feeling faint -shortness of breath or breathing problems -signs and symptoms of infection like fever or chills; cough; or sore throat -signs and symptoms of kidney injury like trouble passing urine or change in the amount of urine -stomach or side pain, or pain at the shoulder -sweating -swelling of the legs, ankles, or abdomen -tiredness Side effects that usually do not require medical attention (report to your doctor or health care professional if they continue or are bothersome): -bone pain -headache -muscle pain -vomiting This list may not describe all possible side effects. Call your doctor for medical advice about side effects. You may report side effects to FDA at 1-800-FDA-1088. Where should I keep my medicine? Keep out of the reach of children. Store in a refrigerator between   2 and 8 degrees C (36 and 46 degrees F). Keep in carton to protect from light. Throw away this medicine if it is left out of the refrigerator for more than 5 consecutive days. Throw away any unused medicine after the expiration  date. NOTE: This sheet is a summary. It may not cover all possible information. If you have questions about this medicine, talk to your doctor, pharmacist, or health care provider.  2018 Elsevier/Gold Standard (2015-06-27 19:07:04)  

## 2016-10-18 ENCOUNTER — Telehealth: Payer: Self-pay

## 2016-10-18 ENCOUNTER — Other Ambulatory Visit: Payer: Self-pay | Admitting: Oncology

## 2016-10-18 NOTE — Telephone Encounter (Signed)
Pt called asking if it was OK for dermatologist to freeze off some area. She was concerned d/t having received neupogen for low WBC.   S/w Dr [name] and instructed pt it is OK for freezing procedures. Do let dermatologist know platelets were 125 so any bleeding may last a little longer.

## 2016-10-22 ENCOUNTER — Ambulatory Visit (HOSPITAL_BASED_OUTPATIENT_CLINIC_OR_DEPARTMENT_OTHER)
Admission: RE | Admit: 2016-10-22 | Discharge: 2016-10-22 | Disposition: A | Discharge status: Home / Self Care | Payer: Managed Care, Other (non HMO) | Source: Ambulatory Visit

## 2016-10-22 ENCOUNTER — Encounter (HOSPITAL_COMMUNITY): Payer: Self-pay

## 2016-10-22 ENCOUNTER — Ambulatory Visit (HOSPITAL_COMMUNITY)
Admission: RE | Admit: 2016-10-22 | Discharge: 2016-10-22 | Disposition: A | Discharge status: Home / Self Care | Payer: Managed Care, Other (non HMO) | Source: Ambulatory Visit | Attending: Cardiology | Admitting: Cardiology

## 2016-10-22 ENCOUNTER — Other Ambulatory Visit (HOSPITAL_COMMUNITY): Payer: Managed Care, Other (non HMO)

## 2016-10-22 VITALS — BP 114/64 | HR 72 | Wt 150.8 lb

## 2016-10-22 DIAGNOSIS — Z170 Estrogen receptor positive status [ER+]: Secondary | ICD-10-CM | POA: Diagnosis present

## 2016-10-22 DIAGNOSIS — Z8041 Family history of malignant neoplasm of ovary: Secondary | ICD-10-CM | POA: Insufficient documentation

## 2016-10-22 DIAGNOSIS — Z808 Family history of malignant neoplasm of other organs or systems: Secondary | ICD-10-CM | POA: Insufficient documentation

## 2016-10-22 DIAGNOSIS — Z803 Family history of malignant neoplasm of breast: Secondary | ICD-10-CM | POA: Diagnosis not present

## 2016-10-22 DIAGNOSIS — Z823 Family history of stroke: Secondary | ICD-10-CM | POA: Insufficient documentation

## 2016-10-22 DIAGNOSIS — C50412 Malignant neoplasm of upper-outer quadrant of left female breast: Secondary | ICD-10-CM

## 2016-10-22 DIAGNOSIS — Z79891 Long term (current) use of opiate analgesic: Secondary | ICD-10-CM | POA: Diagnosis not present

## 2016-10-22 DIAGNOSIS — Z79899 Other long term (current) drug therapy: Secondary | ICD-10-CM | POA: Diagnosis not present

## 2016-10-22 DIAGNOSIS — Z8249 Family history of ischemic heart disease and other diseases of the circulatory system: Secondary | ICD-10-CM | POA: Insufficient documentation

## 2016-10-22 NOTE — Progress Notes (Signed)
  Echocardiogram 2D Echocardiogram has been performed.  Jamie [name] 10/22/2016, 9:47 AM

## 2016-10-22 NOTE — Progress Notes (Signed)
Oncology: Dr. [name]  61 yo with history of breast cancer presents for cardio-oncology evaluation.  Left breast cancer was diagnosed in 2/18, ER+/PR-/HER2+.  She will have lumpectomy followed by Abraxane 12 cycles.  She will get Herceptin x 1 year.    Stable, no exertional dyspnea.  No chest pain.    PMH: 1. Breast cancer: Left breast cancer diagnosed in 2/18, ER+/PR-/HER2+.  She will have lumpectomy followed by Abraxane 12 cycles.  She will get Herceptin x 1 year to start later this month.  - Echo (3/18): EF 60-65%, GLS -21.8%.  - Echo (6/18): EF 60-65%, GLS -23.5%  Social History   Social History  . Marital status: Married    Spouse name: N/A  . Number of children: N/A  . Years of education: N/A   Occupational History  . Not on file.   Social History Main Topics  . Smoking status: Never Smoker  . Smokeless tobacco: Never Used  . Alcohol use 0.0 oz/week     Comment: 1-2  . Drug use: No  . Sexual activity: Not on file   Other Topics Concern  . Not on file   Social History Narrative  . No narrative on file   Family History  Problem Relation Age of Onset  . Ovarian cancer Mother 76  . Hypertension Father   . Stroke Father   . Heart disease Father   . Breast cancer Maternal Aunt 77       recurred at 21  . Heart disease Paternal Grandmother   . Osteoporosis Sister   . Liver cancer Maternal Uncle 77   ROS: All systems reviewed and negative except as per HPI.   Current Outpatient Prescriptions  Medication Sig Dispense Refill  . CALCIUM PO Take 500 mg by mouth 2 (two) times daily. Skeletal strength 348m Calium    . cetirizine (ZYRTEC) 10 MG tablet Take 10 mg by mouth daily.    . Cholecalciferol (VITAMIN D3) 1000 units CAPS Take 1 capsule by mouth daily.    . Cyanocobalamin (B-12) 2500 MCG TABS Take by mouth.    . dexamethasone (DECADRON) 4 MG tablet Take 2 tablets (8 mg total) by mouth daily. Start the day after chemotherapy for 2 days. Take with food. 30 tablet 1   . diphenhydrAMINE (BENADRYL) 25 mg capsule Take 25 mg by mouth every 6 (six) hours as needed.    . Lactobacillus (ACIDOPHILUS PROBIOTIC PO) Take by mouth daily.    .Marland Kitchenlidocaine-prilocaine (EMLA) cream Apply one application to port 1-2 hours prior to access. Cover with saran wrap. 30 g 3  . Melatonin 3 MG TABS Take 6 mg by mouth as needed.    . Multiple Vitamin (MULTIVITAMIN) tablet Take 1 tablet by mouth daily.    . SYMBICORT 160-4.5 MCG/ACT inhaler INL 2 PFS ITL BID  1  . LORazepam (ATIVAN) 0.5 MG tablet Take 1 tablet (0.5 mg total) by mouth at bedtime as needed (Nausea or vomiting). (Patient not taking: Reported on 10/22/2016) 30 tablet 0  . ondansetron (ZOFRAN) 8 MG tablet Take by mouth every 8 (eight) hours as needed.    .Marland KitchenoxyCODONE (OXY IR/ROXICODONE) 5 MG immediate release tablet Take 1-2 tablets (5-10 mg total) by mouth every 6 (six) hours as needed for moderate pain, severe pain or breakthrough pain. (Patient not taking: Reported on 09/18/2016) 20 tablet 0  . prochlorperazine (COMPAZINE) 10 MG tablet Take 10 mg by mouth every 6 (six) hours as needed.     No current  facility-administered medications for this encounter.    BP 114/64   Pulse 72   Wt 150 lb 12 oz (68.4 kg)   SpO2 100%   BMI 24.33 kg/m  General: NAD Neck: JVP 6 cm, no thyromegaly or thyroid nodule.  Lungs: CTAB CV: Nondisplaced PMI.  Heart regular S1/S2, no S3/S4, no murmur.  No peripheral edema.  No carotid bruit.  Normal pedal pulses.  Abdomen: Soft, nontender, no hepatosplenomegaly, no distention.  Skin: Intact without lesions or rashes.  Neurologic: Alert and oriented x 3.  Psych: Normal affect. Extremities: No clubbing or cyanosis.  HEENT: Normal.   Assessment/Plan: 61 yo with breast cancer, ER+/PR-/HER2+.  She is planned for therapy involving 1 year of Herceptin.  I reviewed today's echo, normal EF and global longitudinal strain.  She will continue Herceptin.  She will followup in 3 months with a repeat  echo.  Loralie Champagne 10/22/2016

## 2016-10-22 NOTE — Patient Instructions (Signed)
We will contact you in 3 months to schedule your next appointment and echocardiogram  

## 2016-10-23 ENCOUNTER — Ambulatory Visit (HOSPITAL_BASED_OUTPATIENT_CLINIC_OR_DEPARTMENT_OTHER): Payer: Managed Care, Other (non HMO) | Admitting: Oncology

## 2016-10-23 ENCOUNTER — Encounter: Payer: Self-pay | Admitting: *Deleted

## 2016-10-23 ENCOUNTER — Ambulatory Visit (HOSPITAL_BASED_OUTPATIENT_CLINIC_OR_DEPARTMENT_OTHER): Payer: Managed Care, Other (non HMO)

## 2016-10-23 ENCOUNTER — Ambulatory Visit: Payer: Managed Care, Other (non HMO)

## 2016-10-23 ENCOUNTER — Other Ambulatory Visit (HOSPITAL_BASED_OUTPATIENT_CLINIC_OR_DEPARTMENT_OTHER): Payer: Managed Care, Other (non HMO)

## 2016-10-23 VITALS — BP 106/63 | HR 66 | Temp 98.7°F | Resp 18

## 2016-10-23 DIAGNOSIS — Z5111 Encounter for antineoplastic chemotherapy: Secondary | ICD-10-CM | POA: Diagnosis not present

## 2016-10-23 DIAGNOSIS — Z170 Estrogen receptor positive status [ER+]: Secondary | ICD-10-CM

## 2016-10-23 DIAGNOSIS — C50412 Malignant neoplasm of upper-outer quadrant of left female breast: Secondary | ICD-10-CM

## 2016-10-23 DIAGNOSIS — Z95828 Presence of other vascular implants and grafts: Secondary | ICD-10-CM | POA: Insufficient documentation

## 2016-10-23 LAB — COMPREHENSIVE METABOLIC PANEL
ALBUMIN: 3.7 g/dL (ref 3.5–5.0)
ALK PHOS: 61 U/L (ref 40–150)
ALT: 37 U/L (ref 0–55)
AST: 25 U/L (ref 5–34)
Anion Gap: 8 mEq/L (ref 3–11)
BUN: 11.9 mg/dL (ref 7.0–26.0)
CALCIUM: 9.5 mg/dL (ref 8.4–10.4)
CO2: 27 mEq/L (ref 22–29)
Chloride: 105 mEq/L (ref 98–109)
Creatinine: 0.8 mg/dL (ref 0.6–1.1)
EGFR: 80 mL/min/{1.73_m2} — AB (ref >90)
Glucose: 106 mg/dl (ref 70–140)
POTASSIUM: 4.3 meq/L (ref 3.5–5.1)
Sodium: 140 mEq/L (ref 136–145)
TOTAL PROTEIN: 6.7 g/dL (ref 6.4–8.3)

## 2016-10-23 LAB — CBC WITH DIFFERENTIAL/PLATELET
BASO%: 1.2 % (ref 0.0–2.0)
BASOS ABS: 0.1 10*3/uL (ref 0.0–0.1)
EOS ABS: 0.2 10*3/uL (ref 0.0–0.5)
EOS%: 1.9 % (ref 0.0–7.0)
HCT: 31.6 % — ABNORMAL LOW (ref 34.8–46.6)
HEMOGLOBIN: 10.9 g/dL — AB (ref 11.6–15.9)
LYMPH%: 26.8 % (ref 14.0–49.7)
MCH: 31.3 pg (ref 25.1–34.0)
MCHC: 34.4 g/dL (ref 31.5–36.0)
MCV: 90.8 fL (ref 79.5–101.0)
MONO#: 0.8 10*3/uL (ref 0.1–0.9)
MONO%: 10.2 % (ref 0.0–14.0)
NEUT%: 59.9 % (ref 38.4–76.8)
NEUTROS ABS: 4.9 10*3/uL (ref 1.5–6.5)
Platelets: 576 10*3/uL — ABNORMAL HIGH (ref 145–400)
RBC: 3.48 10*6/uL — ABNORMAL LOW (ref 3.70–5.45)
RDW: 12.6 % (ref 11.2–14.5)
WBC: 8.2 10*3/uL (ref 3.9–10.3)
lymph#: 2.2 10*3/uL (ref 0.9–3.3)

## 2016-10-23 MED ORDER — SODIUM CHLORIDE 0.9% FLUSH
10.0000 mL | Freq: Once | INTRAVENOUS | Status: AC
  Administered 2016-10-23: 10 mL
  Filled 2016-10-23: qty 10

## 2016-10-23 MED ORDER — SODIUM CHLORIDE 0.9 % IV SOLN
Freq: Once | INTRAVENOUS | Status: AC
  Administered 2016-10-23: 11:00:00 via INTRAVENOUS

## 2016-10-23 MED ORDER — PALONOSETRON HCL INJECTION 0.25 MG/5ML
0.2500 mg | Freq: Once | INTRAVENOUS | Status: AC
  Administered 2016-10-23: 0.25 mg via INTRAVENOUS

## 2016-10-23 MED ORDER — DEXAMETHASONE SODIUM PHOSPHATE 10 MG/ML IJ SOLN
INTRAMUSCULAR | Status: AC
  Filled 2016-10-23: qty 1

## 2016-10-23 MED ORDER — PALONOSETRON HCL INJECTION 0.25 MG/5ML
INTRAVENOUS | Status: AC
  Filled 2016-10-23: qty 5

## 2016-10-23 MED ORDER — CARBOPLATIN CHEMO INJECTION 450 MG/45ML
211.8000 mg | Freq: Once | INTRAVENOUS | Status: AC
  Administered 2016-10-23: 210 mg via INTRAVENOUS
  Filled 2016-10-23: qty 21

## 2016-10-23 MED ORDER — SODIUM CHLORIDE 0.9% FLUSH
10.0000 mL | INTRAVENOUS | Status: DC | PRN
  Administered 2016-10-23: 10 mL
  Filled 2016-10-23: qty 10

## 2016-10-23 MED ORDER — DEXAMETHASONE SODIUM PHOSPHATE 10 MG/ML IJ SOLN
10.0000 mg | Freq: Once | INTRAMUSCULAR | Status: AC
  Administered 2016-10-23: 10 mg via INTRAVENOUS

## 2016-10-23 MED ORDER — HEPARIN SOD (PORK) LOCK FLUSH 100 UNIT/ML IV SOLN
500.0000 [IU] | Freq: Once | INTRAVENOUS | Status: AC | PRN
  Administered 2016-10-23: 500 [IU]
  Filled 2016-10-23: qty 5

## 2016-10-23 MED ORDER — SODIUM CHLORIDE 0.9 % IV SOLN
1000.0000 mg/m2 | Freq: Once | INTRAVENOUS | Status: AC
  Administered 2016-10-23: 1786 mg via INTRAVENOUS
  Filled 2016-10-23: qty 46.97

## 2016-10-23 NOTE — Patient Instructions (Signed)
[facility] Discharge Instructions for Patients Receiving Chemotherapy  Today you received the following chemotherapy agents Gemzar, Carboplatin.   To help prevent nausea and vomiting after your treatment, we encourage you to take your nausea medication as prescribed.    If you develop nausea and vomiting that is not controlled by your nausea medication, call the clinic.   BELOW ARE SYMPTOMS THAT SHOULD BE REPORTED IMMEDIATELY:  *FEVER GREATER THAN 100.5 F  *CHILLS WITH OR WITHOUT FEVER  NAUSEA AND VOMITING THAT IS NOT CONTROLLED WITH YOUR NAUSEA MEDICATION  *UNUSUAL SHORTNESS OF BREATH  *UNUSUAL BRUISING OR BLEEDING  TENDERNESS IN MOUTH AND THROAT WITH OR WITHOUT PRESENCE OF ULCERS  *URINARY PROBLEMS  *BOWEL PROBLEMS  UNUSUAL RASH Items with * indicate a potential emergency and should be followed up as soon as possible.  Feel free to call the clinic you have any questions or concerns. The clinic phone number is [phone].  Please show the CHEMO ALERT CARD at check-in to the Emergency Department and triage nurse.   

## 2016-10-23 NOTE — Progress Notes (Signed)
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 This showed a possible mass in the left breast. On 07/02/2016 she underwent left diagnostic mammography with tomography and left breast ultrasonography. The breast density was category C. In the left breast upper outer quadrant there was a microlobulated mass which was not directly palpable, although there was slight thickening in the left breast 1:00 position. Targeted ultrasonography confirmed a solid hypoechoic microlobulated mass in the left breast 1:00 radiant 4 cm from the nipple, measuring 0.8 cm.  On 07/03/2016 Jamie [name] underwent biopsy of the left breast mass in question, and this showed ([id number]) invasive ductal carcinoma, grade 2 or 3, with extracellular mucin, estrogen receptor 5% positive with weak staining intensity, progesterone receptor negative, with an MIB-1 of 50%, and HER-2 amplified, the signals ratio being 5.71 and the number per cell 14.55.  Her subsequent history is as detailed below  INTERVAL HISTORY: Jamie [name] returns today for follow-up of her HER-2/neu positive breast cancer. Today is day 1 cycle 2 of gemcitabine and carboplatin which she now receives on  days 1 and 8 of each 21 day cycle. She required x after her day 8 treatment so that we could treat her today. She will require on Pro on day 9 in subsequent cycles so that we can complete her treatments without lengthy delays which might compromise her chance for cure...  REVIEW OF SYSTEMS: Jamie [name] did very well in the past week, when she did not receive chemotherapy. She felt a lot more energy, and was able to do more. She is a bit afraid of getting out of the house, more than is necessary, and wondered if she could go to church, shopping, out to eat or come or 2 a movie. There has been no significant change in bowel or bladder habits. There has been no peripheral neuropathy. She has not begun to lose her hair. She doesn't have any scalp tingling or tenderness. A detailed review of systems today was otherwise stable.  PAST MEDICAL HISTORY: Past Medical History:  Diagnosis Date  . Anxiety   . Arthritis    feet  . Basal cell carcinoma   . Breast cancer ([place]) 06/2016   left breast  . Eczema   . Genetic testing 08/15/2016   [name] underwent genetic testing for hereditary cancer syndrome through [facility]'s 43-gene Common Hereditary Cancers Panel. 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 Result report is dated 08/15/2016. Please see genetic counseling documentation from 08/17/2016 for further discussion.  [name] PONV (postoperative nausea and vomiting)     PAST SURGICAL HISTORY: Past Surgical History:  Procedure Laterality Date  . BONE SPUR  2001 AND 1988  . BREAST LUMPECTOMY WITH RADIOACTIVE SEED AND SENTINEL LYMPH NODE BIOPSY Left 07/26/2016   Procedure: BREAST LUMPECTOMY WITH RADIOACTIVE SEED AND  SENTINEL LYMPH NODE BIOPSY;  Surgeon: [name], MD;  Location: [facility];  Service: General;  Laterality: Left;  . BUNIONECTOMY  10/13  . COLONOSCOPY W/ POLYPECTOMY  08/2007  . DILATION AND CURETTAGE OF  UTERUS  2002  . MOHS SURGERY  2010  . PORTACATH PLACEMENT Right 07/26/2016   Procedure: INSERTION PORT-A-CATH;  Surgeon: [name], MD;  Location: [facility];  Service: General;  Laterality: Right;    FAMILY HISTORY Family History  Problem Relation Age of Onset  . Ovarian cancer Mother 28  . Hypertension Father   . Stroke Father   . Heart disease Father   . Breast cancer Maternal Aunt 77       recurred at 25  . Heart disease Paternal Grandmother   . Osteoporosis Sister   . Liver cancer Maternal Uncle 24  The patient's father died at age 19, with some form of skin cancer, most likely melanoma. The patient's mother died at the age of 64 with ovarian cancer, which had been diagnosed a few months prior. The patient had no brothers, 1 sister. A maternal aunt was diagnosed with breast cancer at age 75, recurrent age 54.  GYNECOLOGIC HISTORY:  No LMP recorded. Patient is postmenopausal. Menarche age 59, the patient is GX P0. She stopped having periods approximately age 19. 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 Her husband [name] worked as a Dance movement psychotherapist for a [employer]. Their last name is pronounced foh-TEE-ah and they tell me it means "burning" in [place]. Their children are adopted. Jamie [name] lives in [place] and works in Press photographer, and [place] lives in [place] and is an Scientist, water quality. The patient has 3 grandchildren. She is not a Ambulance person.    ADVANCED DIRECTIVES: In place   HEALTH MAINTENANCE: Social History  Substance Use Topics  . Smoking status: Never Smoker  . Smokeless tobacco: Never Used  . Alcohol use 0.0 oz/week     Comment: 1-2     Colonoscopy:2009  YBO:FBPZWC 2018  Bone density:April 2016   Allergies  Allergen Reactions  . Codeine Nausea Only and Other (See Comments)    Dizzy  . Sulfamethoxazole Nausea And Vomiting  . Adhesive [Tape]     Current  Outpatient Prescriptions  Medication Sig Dispense Refill  . CALCIUM PO Take 500 mg by mouth 2 (two) times daily. Skeletal strength 376m Calium    . cetirizine (ZYRTEC) 10 MG tablet Take 10 mg by mouth daily.    . Cholecalciferol (VITAMIN D3) 1000 units CAPS Take 1 capsule by mouth daily.    . Cyanocobalamin (B-12) 2500 MCG TABS Take by mouth.    . dexamethasone (DECADRON) 4 MG tablet Take 2 tablets (8 mg total) by mouth daily. Start the day after chemotherapy for 2 days. Take with food. 30 tablet 1  . diphenhydrAMINE (BENADRYL) 25 mg capsule Take 25 mg by mouth every 6 (six) hours as needed.    . Lactobacillus (ACIDOPHILUS PROBIOTIC PO) Take by mouth daily.    .Marland Kitchenlidocaine-prilocaine (EMLA) cream Apply one application to port 1-2 hours prior to access. Cover with saran wrap. 30 g 3  . LORazepam (ATIVAN) 0.5 MG tablet Take 1 tablet (0.5 mg total) by mouth at bedtime as needed (Nausea or vomiting). (Patient not taking: Reported on 10/22/2016) 30 tablet 0  . Melatonin 3 MG TABS Take 6 mg by mouth as needed.    .[name]  Multiple Vitamin (MULTIVITAMIN) tablet Take 1 tablet by mouth daily.    . ondansetron (ZOFRAN) 8 MG tablet Take by mouth every 8 (eight) hours as needed.    [name] oxyCODONE (OXY IR/ROXICODONE) 5 MG immediate release tablet Take 1-2 tablets (5-10 mg total) by mouth every 6 (six) hours as needed for moderate pain, severe pain or breakthrough pain. (Patient not taking: Reported on 09/18/2016) 20 tablet 0  . prochlorperazine (COMPAZINE) 10 MG tablet Take 10 mg by mouth every 6 (six) hours as needed.    . SYMBICORT 160-4.5 MCG/ACT inhaler INL 2 PFS ITL BID  1   No current facility-administered medications for this visit.     OBJECTIVE: Middle-aged white woman Examined in the treatment chair  There were no vitals filed for this visit.   There is no height or weight on file to calculate BMI.     ECOG FS:1 - Symptomatic but completely ambulatory  Sclerae unicteric, pupils round and equal Oropharynx  clear and moist No cervical or supraclavicular adenopathy Lungs no rales or rhonchi Heart regular rate and rhythm Abd soft, nontender, positive bowel sounds MSK no focal spinal tenderness, no upper extremity lymphedema Neuro: nonfocal, well oriented, appropriate affect Breasts: Deferred  LAB RESULTS:  CMP     Component Value Date/Time   NA 140 10/23/2016 1013   K 4.3 10/23/2016 1013   CL 100 08/04/2015 0831   CO2 27 10/23/2016 1013   GLUCOSE 106 10/23/2016 1013   BUN 11.9 10/23/2016 1013   CREATININE 0.8 10/23/2016 1013   CALCIUM 9.5 10/23/2016 1013   PROT 6.7 10/23/2016 1013   ALBUMIN 3.7 10/23/2016 1013   AST 25 10/23/2016 1013   ALT 37 10/23/2016 1013   ALKPHOS 61 10/23/2016 1013   BILITOT <0.22 10/23/2016 1013   GFRNONAA 86 08/04/2015 0831   GFRAA >89 08/04/2015 0831    INo results found for: SPEP, UPEP  Lab Results  Component Value Date   WBC 8.2 10/23/2016   NEUTROABS 4.9 10/23/2016   HGB 10.9 (L) 10/23/2016   HCT 31.6 (L) 10/23/2016   MCV 90.8 10/23/2016   PLT 576 (H) 10/23/2016      Chemistry      Component Value Date/Time   NA 140 10/23/2016 1013   K 4.3 10/23/2016 1013   CL 100 08/04/2015 0831   CO2 27 10/23/2016 1013   BUN 11.9 10/23/2016 1013   CREATININE 0.8 10/23/2016 1013   GLU 73 08/02/2014      Component Value Date/Time   CALCIUM 9.5 10/23/2016 1013   ALKPHOS 61 10/23/2016 1013   AST 25 10/23/2016 1013   ALT 37 10/23/2016 1013   BILITOT <0.22 10/23/2016 1013       No results found for: LABCA2  No components found for: LABCA125  No results for input(s): INR in the last 168 hours.  Urinalysis No results found for: COLORURINE, APPEARANCEUR, LABSPEC, PHURINE, GLUCOSEU, HGBUR, BILIRUBINUR, KETONESUR, PROTEINUR, UROBILINOGEN, NITRITE, LEUKOCYTESUR   STUDIES: Repeat echocardiogram scheduled for 10/22/2016  ELIGIBLE FOR AVAILABLE RESEARCH PROTOCOL: no  ASSESSMENT: 61 y.o. Jamie [name], [place] woman status post biopsy of the left  breast upper outer quadrant lesion 07/03/2016 showing a clinical T1b N0, stage 1B invasive ductal carcinoma, grade 2 or 3, estrogen receptor weakly positive at 5%, progesterone receptor negative, but HER-2 strongly amplified, with an MIB-1 of 50%.  (1) status post left lumpectomy with sentinel lymph node sampling 07/26/2016 for a pT1c pN0, stage IA invasive ductal carcinoma, grade 3, with extracellular mucin, and negative  margins  (2) chemotherapy will consist of Paclitaxel weekly 12 starting 09/18/2016, discontinued after one cycle due to peripheral neuropathy.   (a) Gemcitabine and Carboplatin started 10/02/2016, to be repeated days 1 and 8 of each 21 day cycle to a total of 11 doses (5-1/2 cycles).  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      PLAN: Jamie [name] is proceeding to her second of 6 planned cycles of carboplatin and gemcitabine given together with trastuzumab. We are having to treat her on a day one and day 8 schedule as counts will not allow [place] to treat her with weekly treatments of chemotherapy. We have added on Pro to her day 9 treatment so that her cycles can be resumed every 21 days. Otherwise her treatment would be very delayed and her chance of cure might be compromised.  I suggested she is free to go to the movies, shopping, out to eat, or go to a party, but she should take purll with her and after shaking anybody's hands go ahead and wash. She can use a mask if she wishes. It will not protect her except that people will not hock her  and that might be enough protection. Of course she should stay away from sick people hospitals nursing home send if possible little children  She will return next week. I will see her again in the treatment Center and if she tolerates this cycle well I will only see her on day 1 of subsequent cycles. [name]  10/23/2016 8:56 PM Medical Oncology and Hematology [facility] [address] [place], [place] [address] Tel. [phone]    Fax. [phone]

## 2016-10-30 ENCOUNTER — Ambulatory Visit (HOSPITAL_BASED_OUTPATIENT_CLINIC_OR_DEPARTMENT_OTHER): Payer: Managed Care, Other (non HMO) | Admitting: Oncology

## 2016-10-30 ENCOUNTER — Other Ambulatory Visit (HOSPITAL_BASED_OUTPATIENT_CLINIC_OR_DEPARTMENT_OTHER): Payer: Managed Care, Other (non HMO)

## 2016-10-30 ENCOUNTER — Ambulatory Visit (HOSPITAL_BASED_OUTPATIENT_CLINIC_OR_DEPARTMENT_OTHER): Payer: Managed Care, Other (non HMO)

## 2016-10-30 ENCOUNTER — Ambulatory Visit: Payer: Managed Care, Other (non HMO)

## 2016-10-30 VITALS — BP 116/69 | HR 73 | Temp 98.2°F | Resp 20 | Ht 66.0 in | Wt 154.3 lb

## 2016-10-30 DIAGNOSIS — Z170 Estrogen receptor positive status [ER+]: Secondary | ICD-10-CM

## 2016-10-30 DIAGNOSIS — Z5111 Encounter for antineoplastic chemotherapy: Secondary | ICD-10-CM

## 2016-10-30 DIAGNOSIS — Z95828 Presence of other vascular implants and grafts: Secondary | ICD-10-CM

## 2016-10-30 DIAGNOSIS — Z85828 Personal history of other malignant neoplasm of skin: Secondary | ICD-10-CM | POA: Diagnosis not present

## 2016-10-30 DIAGNOSIS — C50412 Malignant neoplasm of upper-outer quadrant of left female breast: Secondary | ICD-10-CM | POA: Diagnosis not present

## 2016-10-30 DIAGNOSIS — Z5112 Encounter for antineoplastic immunotherapy: Secondary | ICD-10-CM

## 2016-10-30 LAB — CBC WITH DIFFERENTIAL/PLATELET
BASO%: 1.4 % (ref 0.0–2.0)
BASOS ABS: 0 10*3/uL (ref 0.0–0.1)
EOS%: 0.7 % (ref 0.0–7.0)
Eosinophils Absolute: 0 10*3/uL (ref 0.0–0.5)
HCT: 31.9 % — ABNORMAL LOW (ref 34.8–46.6)
HGB: 11.1 g/dL — ABNORMAL LOW (ref 11.6–15.9)
LYMPH#: 1.8 10*3/uL (ref 0.9–3.3)
LYMPH%: 54.8 % — ABNORMAL HIGH (ref 14.0–49.7)
MCH: 31.1 pg (ref 25.1–34.0)
MCHC: 34.9 g/dL (ref 31.5–36.0)
MCV: 89.2 fL (ref 79.5–101.0)
MONO#: 0.2 10*3/uL (ref 0.1–0.9)
MONO%: 6.5 % (ref 0.0–14.0)
NEUT#: 1.2 10*3/uL — ABNORMAL LOW (ref 1.5–6.5)
NEUT%: 36.6 % — AB (ref 38.4–76.8)
Platelets: 640 10*3/uL — ABNORMAL HIGH (ref 145–400)
RBC: 3.58 10*6/uL — AB (ref 3.70–5.45)
RDW: 12.5 % (ref 11.2–14.5)
WBC: 3.2 10*3/uL — ABNORMAL LOW (ref 3.9–10.3)

## 2016-10-30 LAB — COMPREHENSIVE METABOLIC PANEL
ALT: 44 U/L (ref 0–55)
AST: 30 U/L (ref 5–34)
Albumin: 3.7 g/dL (ref 3.5–5.0)
Alkaline Phosphatase: 61 U/L (ref 40–150)
Anion Gap: 8 mEq/L (ref 3–11)
BUN: 10.9 mg/dL (ref 7.0–26.0)
CHLORIDE: 106 meq/L (ref 98–109)
CO2: 28 meq/L (ref 22–29)
CREATININE: 0.8 mg/dL (ref 0.6–1.1)
Calcium: 9.6 mg/dL (ref 8.4–10.4)
EGFR: 84 mL/min/{1.73_m2} — ABNORMAL LOW (ref >90)
GLUCOSE: 86 mg/dL (ref 70–140)
POTASSIUM: 4.1 meq/L (ref 3.5–5.1)
Sodium: 142 mEq/L (ref 136–145)
Total Bilirubin: <0.22 mg/dL (ref 0.20–1.20)
Total Protein: 6.7 g/dL (ref 6.4–8.3)

## 2016-10-30 MED ORDER — HEPARIN SOD (PORK) LOCK FLUSH 100 UNIT/ML IV SOLN
500.0000 [IU] | Freq: Once | INTRAVENOUS | Status: AC | PRN
  Administered 2016-10-30: 500 [IU]
  Filled 2016-10-30: qty 5

## 2016-10-30 MED ORDER — TRASTUZUMAB CHEMO 150 MG IV SOLR
6.0000 mg/kg | Freq: Once | INTRAVENOUS | Status: AC
  Administered 2016-10-30: 399 mg via INTRAVENOUS
  Filled 2016-10-30: qty 19

## 2016-10-30 MED ORDER — SODIUM CHLORIDE 0.9 % IV SOLN
211.8000 mg | Freq: Once | INTRAVENOUS | Status: AC
  Administered 2016-10-30: 210 mg via INTRAVENOUS
  Filled 2016-10-30: qty 21

## 2016-10-30 MED ORDER — SODIUM CHLORIDE 0.9% FLUSH
10.0000 mL | Freq: Once | INTRAVENOUS | Status: AC
  Administered 2016-10-30: 10 mL
  Filled 2016-10-30: qty 10

## 2016-10-30 MED ORDER — DIPHENHYDRAMINE HCL 25 MG PO CAPS
25.0000 mg | ORAL_CAPSULE | Freq: Once | ORAL | Status: AC
  Administered 2016-10-30: 25 mg via ORAL

## 2016-10-30 MED ORDER — DIPHENHYDRAMINE HCL 25 MG PO CAPS
ORAL_CAPSULE | ORAL | Status: AC
  Filled 2016-10-30: qty 1

## 2016-10-30 MED ORDER — PALONOSETRON HCL INJECTION 0.25 MG/5ML
0.2500 mg | Freq: Once | INTRAVENOUS | Status: AC
  Administered 2016-10-30: 0.25 mg via INTRAVENOUS

## 2016-10-30 MED ORDER — PALONOSETRON HCL INJECTION 0.25 MG/5ML
INTRAVENOUS | Status: AC
  Filled 2016-10-30: qty 5

## 2016-10-30 MED ORDER — ACETAMINOPHEN 325 MG PO TABS
ORAL_TABLET | ORAL | Status: AC
  Filled 2016-10-30: qty 2

## 2016-10-30 MED ORDER — SODIUM CHLORIDE 0.9% FLUSH
10.0000 mL | INTRAVENOUS | Status: DC | PRN
  Administered 2016-10-30: 10 mL
  Filled 2016-10-30: qty 10

## 2016-10-30 MED ORDER — ACETAMINOPHEN 325 MG PO TABS
650.0000 mg | ORAL_TABLET | Freq: Once | ORAL | Status: AC
  Administered 2016-10-30: 650 mg via ORAL

## 2016-10-30 MED ORDER — SODIUM CHLORIDE 0.9 % IV SOLN
1000.0000 mg/m2 | Freq: Once | INTRAVENOUS | Status: AC
  Administered 2016-10-30: 1786 mg via INTRAVENOUS
  Filled 2016-10-30: qty 46.97

## 2016-10-30 MED ORDER — DEXAMETHASONE SODIUM PHOSPHATE 10 MG/ML IJ SOLN
INTRAMUSCULAR | Status: AC
  Filled 2016-10-30: qty 1

## 2016-10-30 MED ORDER — DEXAMETHASONE SODIUM PHOSPHATE 10 MG/ML IJ SOLN
10.0000 mg | Freq: Once | INTRAMUSCULAR | Status: AC
  Administered 2016-10-30: 10 mg via INTRAVENOUS

## 2016-10-30 MED ORDER — SODIUM CHLORIDE 0.9 % IV SOLN
Freq: Once | INTRAVENOUS | Status: AC
  Administered 2016-10-30: 12:00:00 via INTRAVENOUS

## 2016-10-30 MED ORDER — PEGFILGRASTIM 6 MG/0.6ML ~~LOC~~ PSKT
6.0000 mg | PREFILLED_SYRINGE | Freq: Once | SUBCUTANEOUS | Status: AC
  Administered 2016-10-30: 6 mg via SUBCUTANEOUS
  Filled 2016-10-30: qty 0.6

## 2016-10-30 NOTE — Patient Instructions (Signed)

## 2016-10-30 NOTE — Progress Notes (Signed)
OK to treat with ANC 1.2 per Dr. [name] 

## 2016-10-30 NOTE — Patient Instructions (Addendum)
[facility] Discharge Instructions for Patients Receiving Chemotherapy  Today you received the following chemotherapy agents: Gemzar, Carboplatin, and Herceptin   To help prevent nausea and vomiting after your treatment, we encourage you to take your nausea medication as prescribed.     If you develop nausea and vomiting that is not controlled by your nausea medication, call the clinic.   BELOW ARE SYMPTOMS THAT SHOULD BE REPORTED IMMEDIATELY:  *FEVER GREATER THAN 100.5 F  *CHILLS WITH OR WITHOUT FEVER  NAUSEA AND VOMITING THAT IS NOT CONTROLLED WITH YOUR NAUSEA MEDICATION  *UNUSUAL SHORTNESS OF BREATH  *UNUSUAL BRUISING OR BLEEDING  TENDERNESS IN MOUTH AND THROAT WITH OR WITHOUT PRESENCE OF ULCERS  *URINARY PROBLEMS  *BOWEL PROBLEMS  UNUSUAL RASH Items with * indicate a potential emergency and should be followed up as soon as possible.  Feel free to call the clinic you have any questions or concerns. The clinic phone number is [phone].  Please show the [place] at check-in to the Emergency Department and triage nurse.     Neutropenia Neutropenia is a condition that occurs when you have a lower-than-normal level of a type of white blood cell (neutrophil) in your body. Neutrophils are made in the spongy center of large bones (bone marrow) and they fight infections. Neutrophils are your body's main defense against bacterial and fungal infections. The fewer neutrophils you have and the longer your body remains without them, the greater your risk of getting a severe infection. What are the causes? This condition can occur if your body uses up or destroys neutrophils faster than your bone marrow can make them. This problem may happen because of:  Bacterial or fungal infection.  Allergic disorders.  Reactions to some medicines.  Autoimmune disease.  An enlarged spleen.  This condition can also occur if your bone marrow does not produce  enough neutrophils. This problem may be caused by:  Cancer.  Cancer treatments, such as radiation or chemotherapy.  Viral infections.  Medicines, such as phenytoin.  Vitamin B12 deficiency.  Diseases of the bone marrow.  Environmental toxins, such as insecticides.  What are the signs or symptoms? This condition does not usually cause symptoms. If symptoms are present, they are usually caused by an underlying infection. Symptoms of an infection may include:  Fever.  Chills.  Swollen glands.  Oral or anal ulcers.  Cough and shortness of breath.  Rash.  Skin infection.  Fatigue.  How is this diagnosed? Your health care provider may suspect neutropenia if you have:  A condition that may cause neutropenia.  Symptoms of infection, especially fever.  Frequent and unusual infections.  You will have a medical history and physical exam. Tests will also be done, such as:  A complete blood count (CBC).  A procedure to collect a sample of bone marrow for examination (bone marrow biopsy).  A chest X-ray.  A urine culture.  A blood culture.  How is this treated? Treatment depends on the underlying cause and severity of your condition. Mild neutropenia may not require treatment. Treatment may include medicines, such as:  Antibiotic medicine given through an IV tube.  Antiviral medicines.  Antifungal medicines.  A medicine to increase neutrophil production (colony-stimulating factor). You may get this drug through an IV tube or by injection.  Steroids given through an IV tube.  If an underlying condition is causing neutropenia, you may need treatment for that condition. If medicines you are taking are causing neutropenia, your health care  provider may have you stop taking those medicines. Follow these instructions at home: Medicines  Take over-the-counter and prescription medicines only as told by your health care provider.  Get a seasonal flu shot (influenza  vaccine). Lifestyle  Do not eat unpasteurized foods.Do not eat unwashed raw fruits or vegetables.  Avoid exposure to groups of people or children.  Avoid being around people who are sick.  Avoid being around dirt or dust, such as in construction areas or gardens.  Do not provide direct care for pets. Avoid animal droppings. Do not clean litter boxes and bird cages. Hygiene   Bathe daily.  Clean the area between the genitals and the anus (perineal area) after you urinate or have a bowel movement. If you are female, wipe from front to back.  Brush your teeth with a soft toothbrush before and after meals.  Do not use a razor that has a blade. Use an electric razor to remove hair.  Wash your hands often. Make sure others who come in contact with you also wash their hands. If soap and water are not available, use hand sanitizer. General instructions  Do not have sex unless your health care provider has approved.  Take actions to avoid cuts and burns. For example: ? Be cautious when you use knives. Always cut away from yourself. ? Keep knives in protective sheaths or guards when not in use. ? Use oven mitts when you cook with a hot stove, oven, or grill. ? Stand a safe distance away from open fires.  Avoid people who received a vaccine in the past 30 days if that vaccine contained a live version of the germ (live vaccine). You should not get a live vaccine. Common live vaccines are varicella, measles, mumps, and rubella.  Do not share food utensils.  Do not use tampons, enemas, or rectal suppositories unless your health care provider has approved.  Keep all appointments as told by your health care provider. This is important. Contact a health care provider if:  You have a fever.  You have chills or you start to shake.  You have: ? A sore throat. ? A warm, red, or tender area on your skin. ? A cough. ? Frequent or painful urination. ? Vaginal discharge or itching.  You  develop: ? Sores in your mouth or anus. ? Swollen lymph nodes. ? Red streaks on the skin. ? A rash.  You feel: ? Nauseous or you vomit. ? Very fatigued. ? Short of breath. This information is not intended to replace advice given to you by your health care provider. Make sure you discuss any questions you have with your health care provider. Document Released: 10/27/2001 Document Revised: 10/13/2015 Document Reviewed: 11/17/2014 Elsevier Interactive Patient Education  [employer].

## 2016-10-30 NOTE — Progress Notes (Signed)
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 This showed a possible mass in the left breast. On 07/02/2016 she underwent left diagnostic mammography with tomography and left breast ultrasonography. The breast density was category C. In the left breast upper outer quadrant there was a microlobulated mass which was not directly palpable, although there was slight thickening in the left breast 1:00 position. Targeted ultrasonography confirmed a solid hypoechoic microlobulated mass in the left breast 1:00 radiant 4 cm from the nipple, measuring 0.8 cm.  On 07/03/2016 Jamie [name] underwent biopsy of the left breast mass in question, and this showed ([id number]) invasive ductal carcinoma, grade 2 or 3, with extracellular mucin, estrogen receptor 5% positive with weak staining intensity, progesterone receptor negative, with an MIB-1 of 50%, and HER-2 amplified, the signals ratio being 5.71 and the number per cell 14.55.  Her subsequent history is as detailed below  INTERVAL HISTORY: Jamie [name] returns today for follow-up of her HER-2 positive breast cancer. Today is day 8 cycle 2 of 6 planned cycles of carboplatin and gemcitabine which she  receives day 1 and day 8 of each 21 day cycle, with OnPro support on day 9. She also receives trastuzumab on day 8, with a dose due today.  REVIEW OF SYSTEMS: Jamie [name] continues to tolerate treatment remarkably well, aside from the cytopenias. She has not lost her hair. She does get moderately constipated and is fighting that with stool softeners and magnesium powder. She gets headaches late in the day, which she controls with Tylenol. She complains of moderate fatigue. She has some sinus problems. She has some joint pains here and there which are not more intense or persistent than before. She feels anxious and mildly depressed. She experiences some nausea when she completes the dexamethasone treatment. A detailed review of systems today was otherwise stable  PAST MEDICAL HISTORY: Past Medical History:  Diagnosis Date  . Anxiety   . Arthritis    feet  . Basal cell carcinoma   . Breast cancer ([place]) 06/2016   left breast  . Eczema   . Genetic testing 08/15/2016   Jamie [name] underwent genetic testing for hereditary cancer syndrome through [facility]'s 43-gene Common Hereditary Cancers Panel. 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 Result report is dated 08/15/2016. Please see genetic counseling documentation from 08/17/2016 for further discussion.  [name] PONV (postoperative nausea and vomiting)     PAST SURGICAL HISTORY: Past Surgical History:  Procedure Laterality Date  . BONE SPUR  2001 AND 1988  . BREAST LUMPECTOMY WITH RADIOACTIVE SEED AND SENTINEL LYMPH NODE BIOPSY Left 07/26/2016   Procedure: BREAST LUMPECTOMY WITH RADIOACTIVE SEED AND SENTINEL LYMPH NODE BIOPSY;  Surgeon: [name], MD;  Location: [place];  Service: General;  Laterality: Left;  . BUNIONECTOMY  10/13  .  COLONOSCOPY W/ POLYPECTOMY  08/2007  . DILATION AND CURETTAGE OF UTERUS  2002  . MOHS SURGERY  2010  . PORTACATH PLACEMENT  Right 07/26/2016   Procedure: INSERTION PORT-A-CATH;  Surgeon: [name], MD;  Location: [place];  Service: General;  Laterality: Right;    FAMILY HISTORY Family History  Problem Relation Age of Onset  . Ovarian cancer Mother 66  . Hypertension Father   . Stroke Father   . Heart disease Father   . Breast cancer Maternal Aunt 77       recurred at 62  . Heart disease Paternal Grandmother   . Osteoporosis Sister   . Liver cancer Maternal Uncle 17  The patient's father died at age 62, with some form of skin cancer, most likely melanoma. The patient's mother died at the age of 60 with ovarian cancer, which had been diagnosed a few months prior. The patient had no brothers, 1 sister. A maternal aunt was diagnosed with breast cancer at age 32, recurrent age 60.  GYNECOLOGIC HISTORY:  No LMP recorded. Patient is postmenopausal. Menarche age 43, the patient is GX P0. She stopped having periods approximately age 36. 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 Her husband Jamie [name] worked as a Dance movement psychotherapist for a [employer]. Their last name is pronounced foh-TEE-ah and they tell me it means "burning" in [place]. Their children are adopted. Jamie [name] lives in [name] and works in Press photographer, and Jamie [name] lives in [name] and is an Scientist, water quality. The patient has 3 grandchildren. She is not a Ambulance person.    ADVANCED DIRECTIVES: In place   HEALTH MAINTENANCE: Social History  Substance Use Topics  . Smoking status: Never Smoker  . Smokeless tobacco: Never Used  . Alcohol use 0.0 oz/week     Comment: 1-2     Colonoscopy:2009  BUY:ZJQDUK 2018  Bone density:April 2016   Allergies  Allergen Reactions  . Codeine Nausea Only and Other (See Comments)    Dizzy  . Sulfamethoxazole Nausea And Vomiting  . Adhesive [Tape]     Current Outpatient Prescriptions  Medication Sig Dispense Refill  .  CALCIUM PO Take 500 mg by mouth 2 (two) times daily. Skeletal strength 33m Calium    . cetirizine (ZYRTEC) 10 MG tablet Take 10 mg by mouth daily.    . Cholecalciferol (VITAMIN D3) 1000 units CAPS Take 1 capsule by mouth daily.    . Cyanocobalamin (B-12) 2500 MCG TABS Take by mouth.    . dexamethasone (DECADRON) 4 MG tablet Take 2 tablets (8 mg total) by mouth daily. Start the day after chemotherapy for 2 days. Take with food. 30 tablet 1  . diphenhydrAMINE (BENADRYL) 25 mg capsule Take 25 mg by mouth every 6 (six) hours as needed.    . Lactobacillus (ACIDOPHILUS PROBIOTIC PO) Take by mouth daily.    .Marland Kitchenlidocaine-prilocaine (EMLA) cream Apply one application to port 1-2 hours prior to access. Cover with saran wrap. 30 g 3  . LORazepam (ATIVAN) 0.5 MG tablet Take 1 tablet (0.5 mg total) by mouth at bedtime as needed (Nausea or vomiting). (Patient not taking: Reported on 10/22/2016) 30 tablet 0  . Melatonin 3 MG TABS Take 6 mg by mouth as needed.    . Multiple Vitamin (MULTIVITAMIN) tablet Take 1 tablet by mouth daily.    . ondansetron (ZOFRAN) 8 MG tablet Take by mouth every 8 (eight) hours  as needed.    [name] oxyCODONE (OXY IR/ROXICODONE) 5 MG immediate release tablet Take 1-2 tablets (5-10 mg total) by mouth every 6 (six) hours as needed for moderate pain, severe pain or breakthrough pain. (Patient not taking: Reported on 09/18/2016) 20 tablet 0  . prochlorperazine (COMPAZINE) 10 MG tablet Take 10 mg by mouth every 6 (six) hours as needed.    . SYMBICORT 160-4.5 MCG/ACT inhaler INL 2 PFS ITL BID  1   No current facility-administered medications for this visit.     OBJECTIVE: Middle-aged white woman Who appears stated age  86:   10/30/16 1116  BP: 116/69  Pulse: 73  Resp: 20  Temp: 98.2 F (36.8 C)     Body mass index is 24.9 kg/m.     ECOG FS:0 - Asymptomatic  Sclerae unicteric, EOMs intact Oropharynx clear and moist No cervical or supraclavicular adenopathy Lungs no rales or  rhonchi Heart regular rate and rhythm Abd soft, nontender, positive bowel sounds MSK no focal spinal tenderness, no upper extremity lymphedema Neuro: nonfocal, well oriented, appropriate affect Breasts: Deferred   LAB RESULTS:  CMP     Component Value Date/Time   NA 140 10/23/2016 1013   K 4.3 10/23/2016 1013   CL 100 08/04/2015 0831   CO2 27 10/23/2016 1013   GLUCOSE 106 10/23/2016 1013   BUN 11.9 10/23/2016 1013   CREATININE 0.8 10/23/2016 1013   CALCIUM 9.5 10/23/2016 1013   PROT 6.7 10/23/2016 1013   ALBUMIN 3.7 10/23/2016 1013   AST 25 10/23/2016 1013   ALT 37 10/23/2016 1013   ALKPHOS 61 10/23/2016 1013   BILITOT <0.22 10/23/2016 1013   GFRNONAA 86 08/04/2015 0831   GFRAA >89 08/04/2015 0831    INo results found for: SPEP, UPEP  Lab Results  Component Value Date   WBC 3.2 (L) 10/30/2016   NEUTROABS 1.2 (L) 10/30/2016   HGB 11.1 (L) 10/30/2016   HCT 31.9 (L) 10/30/2016   MCV 89.2 10/30/2016   PLT 640 (H) 10/30/2016      Chemistry      Component Value Date/Time   NA 140 10/23/2016 1013   K 4.3 10/23/2016 1013   CL 100 08/04/2015 0831   CO2 27 10/23/2016 1013   BUN 11.9 10/23/2016 1013   CREATININE 0.8 10/23/2016 1013   GLU 73 08/02/2014      Component Value Date/Time   CALCIUM 9.5 10/23/2016 1013   ALKPHOS 61 10/23/2016 1013   AST 25 10/23/2016 1013   ALT 37 10/23/2016 1013   BILITOT <0.22 10/23/2016 1013       No results found for: LABCA2  No components found for: BOFBP102  No results for input(s): INR in the last 168 hours.  Urinalysis No results found for: COLORURINE, APPEARANCEUR, LABSPEC, PHURINE, GLUCOSEU, HGBUR, BILIRUBINUR, KETONESUR, PROTEINUR, UROBILINOGEN, NITRITE, LEUKOCYTESUR   STUDIES: Repeat echocardiogram 10/22/2016 Showed a well-preserved ejection fraction of 60-65%  ELIGIBLE FOR AVAILABLE RESEARCH PROTOCOL: no  ASSESSMENT: 61 y.o. [name], [name] woman status post biopsy of the left breast upper outer quadrant  lesion 07/03/2016 showing a clinical T1b N0, stage 1B invasive ductal carcinoma, grade 2 or 3, estrogen receptor weakly positive at 5%, progesterone receptor negative, but HER-2 strongly amplified, with an MIB-1 of 50%.  (1) status post left lumpectomy with sentinel lymph node sampling 07/26/2016 for a pT1c pN0, stage IA invasive ductal carcinoma, grade 3, with extracellular mucin, and negative margins  (2) chemotherapy will consist of Paclitaxel weekly 12 starting 09/18/2016, discontinued after one  cycle due to peripheral neuropathy.   (a) Gemcitabine and Carboplatin started 10/02/2016, to be repeated days 1 and 8 of each 21 day cycle to a total of 11 doses (5-1/2 cycles).  (3) trastuzumab started 08/07/2016, to continue for one year   (a) baseline echocardiogram 07/25/2016 shows an ejection fraction of 60-65%  (b) repeat echocardiogram 10/22/2016 shows stable ejection fraction  (4) adjuvant radiation to follow chemotherapy  (5) anti-estrogens to follow at the completion of radiation  (a) will avoid tamoxifen given the history of monoclonal allelic MUTYH mutation   (6) genetics testing 08/15/2016 through the [facility]'s 43-gene Common Hereditary Cancers Panel showed a pathogenic variant called, MUTYH, 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      PLAN: Lasundra will complete her second of 6 cycles of carboplatin and Gemzar today (this is dose 4 of 12 planned.). She also will receive trastuzumab today.  We reviewed recent echocardiogram which remains favorable.  She continues to tolerate treatment remarkably well and she also did well with her first Neulasta/OnPro shot.  She will see me again in 2 weeks. I will be the beginning of cycle 3. She knows to call for any problems that may develop before that visit. [name]  10/30/2016 11:26 AM Medical Oncology and Hematology [facility] [place], [place] [address] Tel. [phone]    Fax. [phone]

## 2016-10-31 ENCOUNTER — Encounter: Payer: Self-pay | Admitting: Oncology

## 2016-11-05 ENCOUNTER — Other Ambulatory Visit (HOSPITAL_COMMUNITY)
Admission: AD | Admit: 2016-11-05 | Discharge: 2016-11-05 | Disposition: A | Discharge status: Home / Self Care | Payer: Managed Care, Other (non HMO) | Source: Ambulatory Visit | Attending: Oncology | Admitting: Oncology

## 2016-11-05 ENCOUNTER — Encounter: Payer: Self-pay | Admitting: Oncology

## 2016-11-05 ENCOUNTER — Telehealth: Payer: Self-pay

## 2016-11-05 ENCOUNTER — Telehealth: Payer: Self-pay | Admitting: Oncology

## 2016-11-05 ENCOUNTER — Ambulatory Visit (HOSPITAL_BASED_OUTPATIENT_CLINIC_OR_DEPARTMENT_OTHER): Payer: Managed Care, Other (non HMO) | Admitting: Oncology

## 2016-11-05 ENCOUNTER — Ambulatory Visit (HOSPITAL_BASED_OUTPATIENT_CLINIC_OR_DEPARTMENT_OTHER): Payer: Managed Care, Other (non HMO)

## 2016-11-05 VITALS — BP 106/65 | HR 80 | Temp 98.9°F | Resp 19 | Ht 66.0 in | Wt 150.4 lb

## 2016-11-05 DIAGNOSIS — C50412 Malignant neoplasm of upper-outer quadrant of left female breast: Secondary | ICD-10-CM

## 2016-11-05 DIAGNOSIS — R197 Diarrhea, unspecified: Secondary | ICD-10-CM | POA: Diagnosis not present

## 2016-11-05 DIAGNOSIS — R509 Fever, unspecified: Secondary | ICD-10-CM

## 2016-11-05 DIAGNOSIS — Z170 Estrogen receptor positive status [ER+]: Principal | ICD-10-CM

## 2016-11-05 LAB — CBC WITH DIFFERENTIAL/PLATELET
BASO%: 0.3 % (ref 0.0–2.0)
Basophils Absolute: 0.1 10*3/uL (ref 0.0–0.1)
EOS ABS: 0 10*3/uL (ref 0.0–0.5)
EOS%: 0.1 % (ref 0.0–7.0)
HEMATOCRIT: 31.8 % — AB (ref 34.8–46.6)
HEMOGLOBIN: 10.8 g/dL — AB (ref 11.6–15.9)
LYMPH#: 2 10*3/uL (ref 0.9–3.3)
LYMPH%: 10.6 % — ABNORMAL LOW (ref 14.0–49.7)
MCH: 30.6 pg (ref 25.1–34.0)
MCHC: 33.9 g/dL (ref 31.5–36.0)
MCV: 90.3 fL (ref 79.5–101.0)
MONO#: 0.6 10*3/uL (ref 0.1–0.9)
MONO%: 3.2 % (ref 0.0–14.0)
NEUT%: 85.8 % — ABNORMAL HIGH (ref 38.4–76.8)
NEUTROS ABS: 16.5 10*3/uL — AB (ref 1.5–6.5)
Platelets: 261 10*3/uL (ref 145–400)
RBC: 3.53 10*6/uL — ABNORMAL LOW (ref 3.70–5.45)
RDW: 12.8 % (ref 11.2–14.5)
WBC: 19.2 10*3/uL — ABNORMAL HIGH (ref 3.9–10.3)

## 2016-11-05 LAB — BASIC METABOLIC PANEL
Anion Gap: 7 mEq/L (ref 3–11)
BUN: 6.5 mg/dL — ABNORMAL LOW (ref 7.0–26.0)
CALCIUM: 9.3 mg/dL (ref 8.4–10.4)
CO2: 27 mEq/L (ref 22–29)
Chloride: 103 mEq/L (ref 98–109)
Creatinine: 0.8 mg/dL (ref 0.6–1.1)
EGFR: 78 mL/min/{1.73_m2} — ABNORMAL LOW (ref >90)
Glucose: 94 mg/dl (ref 70–140)
POTASSIUM: 3.5 meq/L (ref 3.5–5.1)
Sodium: 137 mEq/L (ref 136–145)

## 2016-11-05 LAB — C DIFFICILE QUICK SCREEN W PCR REFLEX
C DIFFICILE (CDIFF) INTERP: NOT DETECTED
C DIFFICILE (CDIFF) TOXIN: NEGATIVE
C Diff antigen: NEGATIVE

## 2016-11-05 MED ORDER — CHOLESTYRAMINE 4 G PO PACK: 4.0000 g | PACK | Freq: Two times a day (BID) | ORAL | 0 refills | Status: DC

## 2016-11-05 NOTE — Progress Notes (Signed)
SYMPTOM MANAGEMENT CLINIC    Chief Complaint: Diarrhea  HPI:  Jamie [name] 61 y.o. female diagnosed with breast cancer currently undergoing adjuvant chemotherapy with gemcitabine, carboplatin, and trastuzumab. Last chemotherapy was given on 10/30/2016. Patient reports that she started developing watery diarrhea last Thursday. She had approximately 2 loose stools on that day. She also had a low-grade temperature in the late afternoon of 99.4. She had further watery diarrhea this past Saturday which she reports as "pretty bad". She had 2-3 loose stools on that day. She again had a low-grade fever of 100.1 that day. She had loose stools began on Sunday 3. She spoke with our triage nurse who recommended Imodium. She took approximate 4 Imodium that day which did help. She had 1 loose stool this morning and took 1 Imodium this morning. Denies blood in her stool. The patient is still able to drink and is trying to drink as much water as possible. She is able to eat bland foods including toast. She has not had any fevers today. No shaking chills. Denies chest pain shortness of breath. She has occasional abdominal cramping. She has nausea and uses antiemetics with relief. Denies vomiting. The patient was given a prescription for Questran this morning and she has not yet picked this up.   No history exists.    Review of Systems  Constitutional: Positive for fever, malaise/fatigue and weight loss. Negative for chills and diaphoresis.       Low-grade fevers this past weekend.  HENT: Negative.   Eyes: Negative.   Respiratory: Negative.   Cardiovascular: Negative.   Gastrointestinal: Positive for diarrhea and nausea. Negative for abdominal pain, blood in stool, constipation, heartburn, melena and vomiting.  Genitourinary: Negative.   Musculoskeletal: Negative.   Skin: Negative.   Neurological: Negative.  Negative for weakness.  Endo/Heme/Allergies: Negative.   Psychiatric/Behavioral: Negative.      Past Medical History:  Diagnosis Date  . Anxiety   . Arthritis    feet  . Basal cell carcinoma   . Breast cancer ([place]) 06/2016   left breast  . Eczema   . Genetic testing 08/15/2016   Ms. Bhardwaj underwent genetic testing for hereditary cancer syndrome through [facility]'s 43-gene Common Hereditary Cancers Panel. Ms. Weilbacher testing revealed a single pathogenic mutation in MUTYH and a variant of uncertain significance (VUS) in SDHB. Result report is dated 08/15/2016. Please see genetic counseling documentation from 08/17/2016 for further discussion.  [name] PONV (postoperative nausea and vomiting)     Past Surgical History:  Procedure Laterality Date  . BONE SPUR  2001 AND 1988  . BREAST LUMPECTOMY WITH RADIOACTIVE SEED AND SENTINEL LYMPH NODE BIOPSY Left 07/26/2016   Procedure: BREAST LUMPECTOMY WITH RADIOACTIVE SEED AND SENTINEL LYMPH NODE BIOPSY;  Surgeon: [name], MD;  Location: [place];  Service: General;  Laterality: Left;  . BUNIONECTOMY  10/13  . COLONOSCOPY W/ POLYPECTOMY  08/2007  . DILATION AND CURETTAGE OF UTERUS  2002  . MOHS SURGERY  2010  . PORTACATH PLACEMENT Right 07/26/2016   Procedure: INSERTION PORT-A-CATH;  Surgeon: [name], MD;  Location: [place];  Service: General;  Laterality: Right;    has Allergic rhinitis; Bunion; History of basal cell carcinoma; Eczema; Hyperlipemia; History of colon polyps; Hypothyroidism; Osteopenia; Plantar fascial fibromatosis; RLS (restless legs syndrome); Malignant neoplasm of upper-outer quadrant of left breast in female, estrogen receptor positive ([place]); Genetic testing; and Port catheter in place on her problem list.    is allergic to  codeine; sulfamethoxazole; and adhesive [tape].  Allergies as of 11/05/2016      Reactions   Codeine Nausea Only, Other (See Comments)   Dizzy   Sulfamethoxazole Nausea And Vomiting   Adhesive [tape]       Medication List       Accurate as of 11/05/16   4:51 PM. Always use your most recent med list.          ACIDOPHILUS PROBIOTIC PO Take by mouth daily.   B-12 2500 MCG Tabs Take by mouth.   CALCIUM PO Take 500 mg by mouth 2 (two) times daily. Skeletal strength 310m Calium   cetirizine 10 MG tablet Commonly known as:  ZYRTEC Take 10 mg by mouth daily.   cholestyramine 4 g packet Commonly known as:  QUESTRAN Take 1 packet (4 g total) by mouth 2 (two) times daily. Until diarrhea resolves   dexamethasone 4 MG tablet Commonly known as:  DECADRON Take 2 tablets (8 mg total) by mouth daily. Start the day after chemotherapy for 2 days. Take with food.   diphenhydrAMINE 25 mg capsule Commonly known as:  BENADRYL Take 25 mg by mouth every 6 (six) hours as needed.   lidocaine-prilocaine cream Commonly known as:  EMLA Apply one application to port 1-2 hours prior to access. Cover with saran wrap.   LORazepam 0.5 MG tablet Commonly known as:  ATIVAN Take 1 tablet (0.5 mg total) by mouth at bedtime as needed (Nausea or vomiting).   Melatonin 3 MG Tabs Take 6 mg by mouth as needed.   multivitamin tablet Take 1 tablet by mouth daily.   prochlorperazine 10 MG tablet Commonly known as:  COMPAZINE Take 10 mg by mouth every 6 (six) hours as needed.   SYMBICORT 160-4.5 MCG/ACT inhaler Generic drug:  budesonide-formoterol INL 2 PFS ITL BID   Vitamin D3 1000 units Caps Take 1 capsule by mouth daily.        PHYSICAL EXAMINATION  Oncology Vitals 11/05/2016 10/30/2016  Height 168 cm 168 cm  Weight 68.221 kg 69.99 kg  Weight (lbs) 150 lbs 6 oz 154 lbs 5 oz  BMI (kg/m2) 24.28 kg/m2 24.9 kg/m2  Temp 98.9 98.2  Pulse 80 73  Resp 19 20  SpO2 100 100  BSA (m2) 1.78 m2 1.81 m2   BP Readings from Last 2 Encounters:  11/05/16 106/65  10/30/16 116/69    Physical Exam  Constitutional: She is oriented to person, place, and time and well-developed, well-nourished, and in no distress. No distress.  HENT:  Head: Normocephalic.   Mouth/Throat: Oropharynx is clear and moist. No oropharyngeal exudate.  Eyes: Right eye exhibits no discharge. Left eye exhibits no discharge. No scleral icterus.  Neck: No tracheal deviation present. No thyromegaly present.  Cardiovascular: Normal rate, regular rhythm, normal heart sounds and intact distal pulses.   Pulmonary/Chest: Effort normal and breath sounds normal. No respiratory distress. She has no wheezes. She has no rales. She exhibits no tenderness.  Abdominal: Soft. Bowel sounds are normal. She exhibits no distension. There is no tenderness.  Musculoskeletal: Normal range of motion. She exhibits no edema.  Lymphadenopathy:    She has no cervical adenopathy.  Neurological: She is alert and oriented to person, place, and time. She exhibits normal muscle tone.  Skin: Skin is warm and dry. No rash noted. She is not diaphoretic. No erythema. No pallor.  Psychiatric: Mood, memory, affect and judgment normal.  Vitals reviewed.   LABORATORY DATA:. Appointment on 11/05/2016  Component Date Value  Ref Range Status  . Sodium 11/05/2016 137  136 - 145 mEq/L Final  . Potassium 11/05/2016 3.5  3.5 - 5.1 mEq/L Final  . Chloride 11/05/2016 103  98 - 109 mEq/L Final  . CO2 11/05/2016 27  22 - 29 mEq/L Final  . Glucose 11/05/2016 94  70 - 140 mg/dl Final   Glucose reference range is for nonfasting patients. Fasting glucose reference range is 70- 100.  [name] BUN 11/05/2016 6.5* 7.0 - 26.0 mg/dL Final  . Creatinine 11/05/2016 0.8  0.6 - 1.1 mg/dL Final  . Calcium 11/05/2016 9.3  8.4 - 10.4 mg/dL Final  . Anion Gap 11/05/2016 7  3 - 11 mEq/L Final  . EGFR 11/05/2016 78* >90 ml/min/1.73 m2 Final   eGFR is calculated using the CKD-EPI Creatinine Equation (2009)  . WBC 11/05/2016 19.2* 3.9 - 10.3 10e3/uL Final  . NEUT# 11/05/2016 16.5* 1.5 - 6.5 10e3/uL Final  . HGB 11/05/2016 10.8* 11.6 - 15.9 g/dL Final  . HCT 11/05/2016 31.8* 34.8 - 46.6 % Final  . Platelets 11/05/2016 261  145 - 400 10e3/uL  Final  . MCV 11/05/2016 90.3  79.5 - 101.0 fL Final  . MCH 11/05/2016 30.6  25.1 - 34.0 pg Final  . MCHC 11/05/2016 33.9  31.5 - 36.0 g/dL Final  . RBC 11/05/2016 3.53* 3.70 - 5.45 10e6/uL Final  . RDW 11/05/2016 12.8  11.2 - 14.5 % Final  . lymph# 11/05/2016 2.0  0.9 - 3.3 10e3/uL Final  . MONO# 11/05/2016 0.6  0.1 - 0.9 10e3/uL Final  . Eosinophils Absolute 11/05/2016 0.0  0.0 - 0.5 10e3/uL Final  . Basophils Absolute 11/05/2016 0.1  0.0 - 0.1 10e3/uL Final  . NEUT% 11/05/2016 85.8* 38.4 - 76.8 % Final  . LYMPH% 11/05/2016 10.6* 14.0 - 49.7 % Final  . MONO% 11/05/2016 3.2  0.0 - 14.0 % Final  . EOS% 11/05/2016 0.1  0.0 - 7.0 % Final  . BASO% 11/05/2016 0.3  0.0 - 2.0 % Final    RADIOGRAPHIC STUDIES: No results found.  ASSESSMENT/PLAN:    No problem-specific Assessment & Plan notes found for this encounter.  This is a 61 year old female with breast cancer currently receiving adjuvant chemotherapy with carboplatin, Gemzar, and trastuzumab. Most recent dose was given on 10/30/2016. Patient is now developed watery diarrhea. She has had some relief with Imodium. She has been having low-grade fevers. Labs and a review with the patient and her husband. Her white count is elevated however the patient did receive Neulasta with her chemotherapy. The remainder of her labs are stable. Her potassium is low normal at 3.5. We discussed IV fluids and the patient has declined as she reports she is drinking very well. We discussed potassium-rich foods including bananas, strawberries, and baked potatoes.  Stool for C. difficile was obtained in our office today and is pending at this time. If positive we will treat with Flagyl. If negative we will treat symptomatically. For now, I instructed the patient to not take any Imodium or Questran. If her C. difficile negative, she may resume these medications. We will contact her with the results of her C. difficile one is available.  Patient stated understanding  of all instructions; and was in agreement with this plan of care. The patient knows to call the clinic with any problems, questions or concerns.   Mikey Bussing, NP 11/05/2016

## 2016-11-05 NOTE — Progress Notes (Signed)
Pt able to provide [place] with stool sample for C.Diff

## 2016-11-05 NOTE — Telephone Encounter (Signed)
Labs ordered-CBC, BMET, stool C-diff, questran ordered

## 2016-11-05 NOTE — Telephone Encounter (Signed)
Pt called with vomiting and diarrhea all weekend. Watery diarrhea, nausea, stomach cramps, no vomiting. Weekend RN told her to use immodium AD. Used 4 yesterday and helped a little. Lorazepam and slept. This AM still watery but only 1, used immodium.   Tuesday had chemo Wednesday felt good until neulasta finished injection. Thursday morning diarrhea started. Thought it would go away so did not call Friday, was doing better, eating brat diet. Saturday fever Tmax 100.1. And diarrhea started again.   Did eat some toast, have been able to drink water about 8 cups yesterday.  S/w Dr [name] and he wants her to get 1 liter ns with 20K, stool for c-diff, BMET. He rx questran 4 gm BID until diarrhea resolves.  Jamie [name] stated pt needs [facility] visit.  Pt states she will take about 45 minutes to get to [facility] when given time.

## 2016-11-05 NOTE — Telephone Encounter (Signed)
Call placed to patient. Reported c diff results as negative. We discussed symptomatic management with staying hydrated and advancing diet as tolerated. We also discussed using Imodium up to 8 tabs/day.  Patient to call if not improving in the next 1-2 days. Requested call from [facility] RN to patient later this week.

## 2016-11-06 ENCOUNTER — Other Ambulatory Visit: Payer: Managed Care, Other (non HMO)

## 2016-11-06 ENCOUNTER — Ambulatory Visit: Payer: Managed Care, Other (non HMO) | Admitting: Oncology

## 2016-11-06 ENCOUNTER — Ambulatory Visit: Payer: Managed Care, Other (non HMO)

## 2016-11-07 ENCOUNTER — Telehealth: Payer: Self-pay | Admitting: *Deleted

## 2016-11-07 NOTE — Telephone Encounter (Signed)
This RN spoke with pt per her message needing advice on continuing " imodium ".  Jamie [name] states she is now having approximately 3 stools today consisting of small formed " yellowish" stool.  She has been following a bland diet as well.  Per discussion - this RN informed pt she may hold the imodium and continue the bland diet.  If stools become more watery she should resume the imodium- other wise if stools continue to be formed - she may forward more foods into her diet by late tomorrow.  This RN discussed her concern with color of stool - informing changes in color is expected while she is under going chemo-and not unusual. Medically - concern is when there is blood in stool.  Jamie [name] verbalized understanding.

## 2016-11-13 ENCOUNTER — Ambulatory Visit (HOSPITAL_BASED_OUTPATIENT_CLINIC_OR_DEPARTMENT_OTHER): Payer: Managed Care, Other (non HMO) | Admitting: Oncology

## 2016-11-13 ENCOUNTER — Ambulatory Visit: Payer: Managed Care, Other (non HMO)

## 2016-11-13 ENCOUNTER — Ambulatory Visit (HOSPITAL_BASED_OUTPATIENT_CLINIC_OR_DEPARTMENT_OTHER): Payer: Managed Care, Other (non HMO)

## 2016-11-13 ENCOUNTER — Other Ambulatory Visit (HOSPITAL_BASED_OUTPATIENT_CLINIC_OR_DEPARTMENT_OTHER): Payer: Managed Care, Other (non HMO)

## 2016-11-13 ENCOUNTER — Other Ambulatory Visit: Payer: Managed Care, Other (non HMO)

## 2016-11-13 VITALS — BP 105/59 | HR 75 | Temp 98.5°F | Resp 18 | Ht 66.0 in | Wt 151.7 lb

## 2016-11-13 DIAGNOSIS — Z170 Estrogen receptor positive status [ER+]: Secondary | ICD-10-CM

## 2016-11-13 DIAGNOSIS — R197 Diarrhea, unspecified: Secondary | ICD-10-CM | POA: Diagnosis not present

## 2016-11-13 DIAGNOSIS — C50412 Malignant neoplasm of upper-outer quadrant of left female breast: Secondary | ICD-10-CM | POA: Diagnosis not present

## 2016-11-13 DIAGNOSIS — Z5111 Encounter for antineoplastic chemotherapy: Secondary | ICD-10-CM

## 2016-11-13 DIAGNOSIS — Z95828 Presence of other vascular implants and grafts: Secondary | ICD-10-CM

## 2016-11-13 LAB — CBC WITH DIFFERENTIAL/PLATELET
BASO%: 0.5 % (ref 0.0–2.0)
Basophils Absolute: 0.1 10*3/uL (ref 0.0–0.1)
EOS%: 1.5 % (ref 0.0–7.0)
Eosinophils Absolute: 0.2 10*3/uL (ref 0.0–0.5)
HCT: 31.2 % — ABNORMAL LOW (ref 34.8–46.6)
HGB: 10.5 g/dL — ABNORMAL LOW (ref 11.6–15.9)
LYMPH%: 16.1 % (ref 14.0–49.7)
MCH: 30.9 pg (ref 25.1–34.0)
MCHC: 33.7 g/dL (ref 31.5–36.0)
MCV: 91.7 fL (ref 79.5–101.0)
MONO#: 1.3 10*3/uL — ABNORMAL HIGH (ref 0.1–0.9)
MONO%: 7.8 % (ref 0.0–14.0)
NEUT#: 12.3 10*3/uL — ABNORMAL HIGH (ref 1.5–6.5)
NEUT%: 74.1 % (ref 38.4–76.8)
PLATELETS: 307 10*3/uL (ref 145–400)
RBC: 3.41 10*6/uL — AB (ref 3.70–5.45)
RDW: 13.7 % (ref 11.2–14.5)
WBC: 16.6 10*3/uL — AB (ref 3.9–10.3)
lymph#: 2.7 10*3/uL (ref 0.9–3.3)

## 2016-11-13 LAB — COMPREHENSIVE METABOLIC PANEL
ALT: 78 U/L — AB (ref 0–55)
ANION GAP: 11 meq/L (ref 3–11)
AST: 41 U/L — ABNORMAL HIGH (ref 5–34)
Albumin: 3.6 g/dL (ref 3.5–5.0)
Alkaline Phosphatase: 95 U/L (ref 40–150)
BUN: 10.2 mg/dL (ref 7.0–26.0)
CHLORIDE: 106 meq/L (ref 98–109)
CO2: 26 mEq/L (ref 22–29)
Calcium: 9.6 mg/dL (ref 8.4–10.4)
Creatinine: 0.8 mg/dL (ref 0.6–1.1)
EGFR: 77 mL/min/{1.73_m2} — AB (ref >90)
GLUCOSE: 102 mg/dL (ref 70–140)
Potassium: 4.4 mEq/L (ref 3.5–5.1)
SODIUM: 143 meq/L (ref 136–145)
Total Bilirubin: <0.22 mg/dL (ref 0.20–1.20)
Total Protein: 6.8 g/dL (ref 6.4–8.3)

## 2016-11-13 MED ORDER — SODIUM CHLORIDE 0.9% FLUSH
10.0000 mL | INTRAVENOUS | Status: DC | PRN
  Administered 2016-11-13: 10 mL
  Filled 2016-11-13: qty 10

## 2016-11-13 MED ORDER — DEXAMETHASONE SODIUM PHOSPHATE 10 MG/ML IJ SOLN
INTRAMUSCULAR | Status: AC
  Filled 2016-11-13: qty 1

## 2016-11-13 MED ORDER — PALONOSETRON HCL INJECTION 0.25 MG/5ML
0.2500 mg | Freq: Once | INTRAVENOUS | Status: AC
Start: 2016-11-13 — End: 2016-11-13
  Administered 2016-11-13: 0.25 mg via INTRAVENOUS

## 2016-11-13 MED ORDER — SODIUM CHLORIDE 0.9 % IV SOLN
Freq: Once | INTRAVENOUS | Status: AC
  Administered 2016-11-13: 09:00:00 via INTRAVENOUS

## 2016-11-13 MED ORDER — SODIUM CHLORIDE 0.9% FLUSH
10.0000 mL | Freq: Once | INTRAVENOUS | Status: AC
  Administered 2016-11-13: 10 mL
  Filled 2016-11-13: qty 10

## 2016-11-13 MED ORDER — SODIUM CHLORIDE 0.9 % IV SOLN
1000.0000 mg/m2 | Freq: Once | INTRAVENOUS | Status: AC
  Administered 2016-11-13: 1786 mg via INTRAVENOUS
  Filled 2016-11-13: qty 46.97

## 2016-11-13 MED ORDER — SODIUM CHLORIDE 0.9 % IV SOLN
211.8000 mg | Freq: Once | INTRAVENOUS | Status: AC
  Administered 2016-11-13: 210 mg via INTRAVENOUS
  Filled 2016-11-13: qty 21

## 2016-11-13 MED ORDER — DEXAMETHASONE SODIUM PHOSPHATE 10 MG/ML IJ SOLN
10.0000 mg | Freq: Once | INTRAMUSCULAR | Status: AC
  Administered 2016-11-13: 10 mg via INTRAVENOUS

## 2016-11-13 MED ORDER — PALONOSETRON HCL INJECTION 0.25 MG/5ML
INTRAVENOUS | Status: AC
  Filled 2016-11-13: qty 5

## 2016-11-13 MED ORDER — HEPARIN SOD (PORK) LOCK FLUSH 100 UNIT/ML IV SOLN
500.0000 [IU] | Freq: Once | INTRAVENOUS | Status: AC | PRN
  Administered 2016-11-13: 500 [IU]
  Filled 2016-11-13: qty 5

## 2016-11-13 NOTE — Progress Notes (Signed)
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 This showed a possible mass in the left breast. On 07/02/2016 she underwent left diagnostic mammography with tomography and left breast ultrasonography. The breast density was category C. In the left breast upper outer quadrant there was a microlobulated mass which was not directly palpable, although there was slight thickening in the left breast 1:00 position. Targeted ultrasonography confirmed a solid hypoechoic microlobulated mass in the left breast 1:00 radiant 4 cm from the nipple, measuring 0.8 cm.  On 07/03/2016 Jamie [name] underwent biopsy of the left breast mass in question, and this showed ([id number]) invasive ductal carcinoma, grade 2 or 3, with extracellular mucin, estrogen receptor 5% positive with weak staining intensity, progesterone receptor negative, with an MIB-1 of 50%, and HER-2 amplified, the signals ratio being 5.71 and the number per cell 14.55.  Her subsequent history is as detailed below  INTERVAL HISTORY: Jamie [name] returns today for follow-up and treatment of her weakly positive estrogen receptor positive breast cancer, which is strongly HER-2 positive and which  is being treated adjuvantly with carboplatin/gemcitabine given days 1 and 8 of each 21 day cycle, with trastuzumab given on day 8 of each cycle. Today is day 1 cycle 3. She receives on Pro on day 9 of each cycle.  Since the last visit here she had an episode of diarrhea. This was C. difficile negative. It lasted several days. It resolved without and intervention other than Imodium.   REVIEW OF SYSTEMS: [name] had some bony pain almost as soon as she got her Neulasta. Since the diarrhea occurred shortly after that she wonders if there was a relationship between the Neulasta and the diarrhea. She's had no mouth sores, no vomiting, although she did ask mild nausea. She took lorazepam for that. Her port is working well. Her taste is bland but not bad. His past week off treatment she has felt terrific, went to the pool, and hates to go back to treatment now because she knows she is going to feel bad again. Otherwise it's quite remarkable she has not lost her hair. A detailed review of systems today was otherwise stable  PAST MEDICAL HISTORY: Past Medical History:  Diagnosis Date  . Anxiety   . Arthritis    feet  . Basal cell carcinoma   . Breast cancer ([place]) 06/2016   left breast  . Eczema   . Genetic testing 08/15/2016   [name] underwent genetic testing for hereditary cancer syndrome through [facility]'s 43-gene Common Hereditary Cancers Panel. 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 Result report is dated 08/15/2016. Please see genetic counseling documentation from 08/17/2016 for further discussion.  [name] PONV (postoperative nausea and vomiting)     PAST SURGICAL HISTORY: Past Surgical History:  Procedure Laterality Date  . BONE  SPUR  2001 AND 1988  . BREAST LUMPECTOMY WITH RADIOACTIVE SEED AND SENTINEL LYMPH NODE BIOPSY Left 07/26/2016   Procedure: BREAST LUMPECTOMY WITH RADIOACTIVE SEED AND SENTINEL LYMPH NODE  BIOPSY;  Surgeon: [name], MD;  Location: [facility];  Service: General;  Laterality: Left;  . BUNIONECTOMY  10/13  . COLONOSCOPY W/ POLYPECTOMY  08/2007  . DILATION AND CURETTAGE OF UTERUS  2002  . MOHS SURGERY  2010  . PORTACATH PLACEMENT Right 07/26/2016   Procedure: INSERTION PORT-A-CATH;  Surgeon: [name], MD;  Location: [place];  Service: General;  Laterality: Right;    FAMILY HISTORY Family History  Problem Relation Age of Onset  . Ovarian cancer Mother 48  . Hypertension Father   . Stroke Father   . Heart disease Father   . Breast cancer Maternal Aunt 77       recurred at 62  . Heart disease Paternal Grandmother   . Osteoporosis Sister   . Liver cancer Maternal Uncle 1  The patient's father died at age 25, with some form of skin cancer, most likely melanoma. The patient's mother died at the age of 57 with ovarian cancer, which had been diagnosed a few months prior. The patient had no brothers, 1 sister. A maternal aunt was diagnosed with breast cancer at age 65, recurrent age 88.  GYNECOLOGIC HISTORY:  No LMP recorded. Patient is postmenopausal. Menarche age 61, the patient is GX P0. She stopped having periods approximately age 61. 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 Her husband [name] worked as a Dance movement psychotherapist for a [employer]. Their last name is pronounced foh-TEE-ah and they tell me it means "burning" in [place]. Their children are adopted. Jamie [name] lives in [place] and works in Press photographer, and Jamie [name] lives in [place] and is an Scientist, water quality. The patient has 3 grandchildren. She is not a Ambulance person.    ADVANCED DIRECTIVES: In place   HEALTH MAINTENANCE: Social History  Substance Use Topics  . Smoking status: Never Smoker  . Smokeless tobacco: Never Used  . Alcohol use 0.0 oz/week     Comment: 1-2     Colonoscopy:2009  OEU:MPNTIR  2018  Bone density:April 2016   Allergies  Allergen Reactions  . Codeine Nausea Only and Other (See Comments)    Dizzy  . Sulfamethoxazole Nausea And Vomiting  . Adhesive [Tape]     Current Outpatient Prescriptions  Medication Sig Dispense Refill  . CALCIUM PO Take 500 mg by mouth 2 (two) times daily. Skeletal strength 383m Calium    . cetirizine (ZYRTEC) 10 MG tablet Take 10 mg by mouth daily.    . Cholecalciferol (VITAMIN D3) 1000 units CAPS Take 1 capsule by mouth daily.    . cholestyramine (QUESTRAN) 4 g packet Take 1 packet (4 g total) by mouth 2 (two) times daily. Until diarrhea resolves 14 each 0  . Cyanocobalamin (B-12) 2500 MCG TABS Take by mouth.    . dexamethasone (DECADRON) 4 MG tablet Take 2 tablets (8 mg total) by mouth daily. Start the day after chemotherapy for 2 days. Take with food. 30 tablet 1  . diphenhydrAMINE (BENADRYL) 25 mg capsule Take 25 mg by mouth every 6 (six) hours as needed.    . Lactobacillus (ACIDOPHILUS PROBIOTIC PO) Take by mouth daily.    .Marland Kitchenlidocaine-prilocaine (EMLA) cream Apply one application to port 1-2 hours prior to access.  Cover with saran wrap. 30 g 3  . LORazepam (ATIVAN) 0.5 MG tablet Take 1 tablet (0.5 mg total) by mouth at bedtime as needed (Nausea or vomiting). (Patient not taking: Reported on 10/22/2016) 30 tablet 0  . Melatonin 3 MG TABS Take 6 mg by mouth as needed.    . Multiple Vitamin (MULTIVITAMIN) tablet Take 1 tablet by mouth daily.    . prochlorperazine (COMPAZINE) 10 MG tablet Take 10 mg by mouth every 6 (six) hours as needed.    . SYMBICORT 160-4.5 MCG/ACT inhaler INL 2 PFS ITL BID  1   No current facility-administered medications for this visit.     OBJECTIVE: Middle-aged white woman In no acute distress.norm1  Vitals:   11/13/16 0806  BP: (!) 105/59  Pulse: 75  Resp: 18  Temp: 98.5 F (36.9 C)     Body mass index is 24.49 kg/m.     ECOG FS:0 - Asymptomatic  Sclerae unicteric, pupils round and equal Oropharynx  clear and moist No cervical or supraclavicular adenopathy Lungs no rales or rhonchi Heart regular rate and rhythm Abd soft, nontender, positive bowel sounds MSK no focal spinal tenderness, no upper extremity lymphedema Neuro: nonfocal, well oriented, appropriate affect Breasts: Deferred    LAB RESULTS:  CMP     Component Value Date/Time   NA 137 11/05/2016 1218   K 3.5 11/05/2016 1218   CL 100 08/04/2015 0831   CO2 27 11/05/2016 1218   GLUCOSE 94 11/05/2016 1218   BUN 6.5 (L) 11/05/2016 1218   CREATININE 0.8 11/05/2016 1218   CALCIUM 9.3 11/05/2016 1218   PROT 6.7 10/30/2016 0958   ALBUMIN 3.7 10/30/2016 0958   AST 30 10/30/2016 0958   ALT 44 10/30/2016 0958   ALKPHOS 61 10/30/2016 0958   BILITOT <0.22 10/30/2016 0958   GFRNONAA 86 08/04/2015 0831   GFRAA >89 08/04/2015 0831    INo results found for: SPEP, UPEP  Lab Results  Component Value Date   WBC 16.6 (H) 11/13/2016   NEUTROABS 12.3 (H) 11/13/2016   HGB 10.5 (L) 11/13/2016   HCT 31.2 (L) 11/13/2016   MCV 91.7 11/13/2016   PLT 307 11/13/2016      Chemistry      Component Value Date/Time   NA 137 11/05/2016 1218   K 3.5 11/05/2016 1218   CL 100 08/04/2015 0831   CO2 27 11/05/2016 1218   BUN 6.5 (L) 11/05/2016 1218   CREATININE 0.8 11/05/2016 1218   GLU 73 08/02/2014      Component Value Date/Time   CALCIUM 9.3 11/05/2016 1218   ALKPHOS 61 10/30/2016 0958   AST 30 10/30/2016 0958   ALT 44 10/30/2016 0958   BILITOT <0.22 10/30/2016 0958       No results found for: LABCA2  No components found for: QGBEE100  No results for input(s): INR in the last 168 hours.  Urinalysis No results found for: COLORURINE, APPEARANCEUR, LABSPEC, PHURINE, GLUCOSEU, HGBUR, BILIRUBINUR, KETONESUR, PROTEINUR, UROBILINOGEN, NITRITE, LEUKOCYTESUR   STUDIES: C. difficile sent 11/05/2016 was negative.  ELIGIBLE FOR AVAILABLE RESEARCH PROTOCOL: no  ASSESSMENT: 61 y.o. [place] Ser, [name] woman status post biopsy of  the left breast upper outer quadrant lesion 07/03/2016 showing a clinical T1b N0, stage 1B invasive ductal carcinoma, grade 2 or 3, estrogen receptor weakly positive at 5%, progesterone receptor negative, but HER-2 strongly amplified, with an MIB-1 of 50%.  (1) status post left lumpectomy with sentinel lymph node sampling 07/26/2016 for a pT1c pN0, stage IA invasive ductal  carcinoma, grade 3, with extracellular mucin, and negative margins  (2) chemotherapy will consist of Paclitaxel weekly 12 starting 09/18/2016, discontinued after one cycle due to peripheral neuropathy.   (a) Gemcitabine and Carboplatin started 10/02/2016, to be repeated days 1 and 8 of each 21 day cycle to a total of 11 doses (5-1/2 cycles).  (3) trastuzumab started 08/07/2016, to continue for 6-12 months  (a) baseline echocardiogram 07/25/2016 shows an ejection fraction of 60-65%  (b) repeat echocardiogram 10/22/2016 shows stable ejection fraction  (4) adjuvant radiation to follow chemotherapy  (5) anti-estrogens to follow at the completion of radiation  (a) will avoid tamoxifen given the history of monoclonal allelic MUTYH mutation   (6) genetics testing 08/15/2016 through the [facility]'s 43-gene Common Hereditary Cancers Panel showed a pathogenic variant called, MUTYH, 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   (a) associated with increased colorectal cancer risk, possibly breast cancer (at least in Sephardic Jews)     PLAN: Kriss will begin her third cycle of carboplatin/gemcitabine today. She thinks she has been approved only for 4 cycles but she really will need 5 (this will make 11 doses of chemotherapy as opposed to the 12 doses of Taxol originally planned). I have scheduled the treatments for her but we will need to make sure there have been approved.  I don't think the diarrhea had anything to do with Neulasta except temporally. I have asked her to call us immediately if he develops again. She does have Imodium and Questran both  available  She is interested in starting to plan the radiation treatment so I have requested an appointment with Dr. Sondra Come for sometime in early August. I suspect she will not start radiation until mid-September or so.  She will see me again at the beginning of cycle 4. She knows to call for any problems that may develop before the next visit. [name]  11/13/2016 8:27 AM Medical Oncology and Hematology [facility] [address] [place], [place] [address] Tel. [phone]    Fax. [phone]

## 2016-11-13 NOTE — Patient Instructions (Signed)
[facility] Discharge Instructions for Patients Receiving Chemotherapy  Today you received the following chemotherapy agents: Gemzar and Carboplatin   To help prevent nausea and vomiting after your treatment, we encourage you to take your nausea medication as directed.    If you develop nausea and vomiting that is not controlled by your nausea medication, call the clinic.   BELOW ARE SYMPTOMS THAT SHOULD BE REPORTED IMMEDIATELY:  *FEVER GREATER THAN 100.5 F  *CHILLS WITH OR WITHOUT FEVER  NAUSEA AND VOMITING THAT IS NOT CONTROLLED WITH YOUR NAUSEA MEDICATION  *UNUSUAL SHORTNESS OF BREATH  *UNUSUAL BRUISING OR BLEEDING  TENDERNESS IN MOUTH AND THROAT WITH OR WITHOUT PRESENCE OF ULCERS  *URINARY PROBLEMS  *BOWEL PROBLEMS  UNUSUAL RASH Items with * indicate a potential emergency and should be followed up as soon as possible.  Feel free to call the clinic you have any questions or concerns. The clinic phone number is [phone].  Please show the CHEMO ALERT CARD at check-in to the Emergency Department and triage nurse.   

## 2016-11-20 ENCOUNTER — Ambulatory Visit (HOSPITAL_BASED_OUTPATIENT_CLINIC_OR_DEPARTMENT_OTHER): Payer: Managed Care, Other (non HMO)

## 2016-11-20 ENCOUNTER — Other Ambulatory Visit (HOSPITAL_BASED_OUTPATIENT_CLINIC_OR_DEPARTMENT_OTHER): Payer: Managed Care, Other (non HMO)

## 2016-11-20 ENCOUNTER — Other Ambulatory Visit: Payer: Self-pay | Admitting: Hematology and Oncology

## 2016-11-20 ENCOUNTER — Ambulatory Visit: Payer: Managed Care, Other (non HMO)

## 2016-11-20 VITALS — BP 102/55 | HR 66 | Temp 98.4°F | Resp 16

## 2016-11-20 DIAGNOSIS — Z170 Estrogen receptor positive status [ER+]: Secondary | ICD-10-CM

## 2016-11-20 DIAGNOSIS — Z5112 Encounter for antineoplastic immunotherapy: Secondary | ICD-10-CM | POA: Diagnosis not present

## 2016-11-20 DIAGNOSIS — Z5189 Encounter for other specified aftercare: Secondary | ICD-10-CM | POA: Diagnosis not present

## 2016-11-20 DIAGNOSIS — Z95828 Presence of other vascular implants and grafts: Secondary | ICD-10-CM

## 2016-11-20 DIAGNOSIS — C50412 Malignant neoplasm of upper-outer quadrant of left female breast: Secondary | ICD-10-CM

## 2016-11-20 DIAGNOSIS — Z5111 Encounter for antineoplastic chemotherapy: Secondary | ICD-10-CM

## 2016-11-20 LAB — CBC WITH DIFFERENTIAL/PLATELET
BASO%: 1.5 % (ref 0.0–2.0)
BASOS ABS: 0.1 10*3/uL (ref 0.0–0.1)
EOS ABS: 0.1 10*3/uL (ref 0.0–0.5)
EOS%: 1.6 % (ref 0.0–7.0)
HCT: 27.4 % — ABNORMAL LOW (ref 34.8–46.6)
HEMOGLOBIN: 9.6 g/dL — AB (ref 11.6–15.9)
LYMPH%: 43.7 % (ref 14.0–49.7)
MCH: 31.9 pg (ref 25.1–34.0)
MCHC: 34.9 g/dL (ref 31.5–36.0)
MCV: 91.3 fL (ref 79.5–101.0)
MONO#: 0.1 10*3/uL (ref 0.1–0.9)
MONO%: 3.6 % (ref 0.0–14.0)
NEUT#: 1.8 10*3/uL (ref 1.5–6.5)
NEUT%: 49.6 % (ref 38.4–76.8)
PLATELETS: 454 10*3/uL — AB (ref 145–400)
RBC: 3 10*6/uL — AB (ref 3.70–5.45)
RDW: 14.4 % (ref 11.2–14.5)
WBC: 3.7 10*3/uL — ABNORMAL LOW (ref 3.9–10.3)
lymph#: 1.6 10*3/uL (ref 0.9–3.3)

## 2016-11-20 LAB — COMPREHENSIVE METABOLIC PANEL
ALBUMIN: 3.6 g/dL (ref 3.5–5.0)
ALK PHOS: 59 U/L (ref 40–150)
ALT: 66 U/L — ABNORMAL HIGH (ref 0–55)
ANION GAP: 9 meq/L (ref 3–11)
AST: 37 U/L — ABNORMAL HIGH (ref 5–34)
BUN: 12.3 mg/dL (ref 7.0–26.0)
CO2: 26 mEq/L (ref 22–29)
Calcium: 9.1 mg/dL (ref 8.4–10.4)
Chloride: 107 mEq/L (ref 98–109)
Creatinine: 0.7 mg/dL (ref 0.6–1.1)
EGFR: 88 mL/min/{1.73_m2} — AB (ref >90)
GLUCOSE: 85 mg/dL (ref 70–140)
POTASSIUM: 4.2 meq/L (ref 3.5–5.1)
SODIUM: 142 meq/L (ref 136–145)
Total Bilirubin: 0.4 mg/dL (ref 0.20–1.20)
Total Protein: 6.3 g/dL — ABNORMAL LOW (ref 6.4–8.3)

## 2016-11-20 MED ORDER — SODIUM CHLORIDE 0.9% FLUSH
10.0000 mL | INTRAVENOUS | Status: DC | PRN
  Administered 2016-11-20: 10 mL
  Filled 2016-11-20: qty 10

## 2016-11-20 MED ORDER — DEXAMETHASONE SODIUM PHOSPHATE 10 MG/ML IJ SOLN
INTRAMUSCULAR | Status: AC
  Filled 2016-11-20: qty 1

## 2016-11-20 MED ORDER — TRASTUZUMAB CHEMO 150 MG IV SOLR
6.0000 mg/kg | Freq: Once | INTRAVENOUS | Status: AC
  Administered 2016-11-20: 399 mg via INTRAVENOUS
  Filled 2016-11-20: qty 19

## 2016-11-20 MED ORDER — SODIUM CHLORIDE 0.9% FLUSH
10.0000 mL | Freq: Once | INTRAVENOUS | Status: AC
  Administered 2016-11-20: 10 mL
  Filled 2016-11-20: qty 10

## 2016-11-20 MED ORDER — SODIUM CHLORIDE 0.9 % IV SOLN
Freq: Once | INTRAVENOUS | Status: AC
  Administered 2016-11-20: 09:00:00 via INTRAVENOUS

## 2016-11-20 MED ORDER — ACETAMINOPHEN 325 MG PO TABS
650.0000 mg | ORAL_TABLET | Freq: Once | ORAL | Status: AC
  Administered 2016-11-20: 650 mg via ORAL

## 2016-11-20 MED ORDER — PALONOSETRON HCL INJECTION 0.25 MG/5ML
0.2500 mg | Freq: Once | INTRAVENOUS | Status: AC
  Administered 2016-11-20: 0.25 mg via INTRAVENOUS

## 2016-11-20 MED ORDER — SODIUM CHLORIDE 0.9 % IV SOLN
1000.0000 mg/m2 | Freq: Once | INTRAVENOUS | Status: AC
  Administered 2016-11-20: 1786 mg via INTRAVENOUS
  Filled 2016-11-20: qty 46.97

## 2016-11-20 MED ORDER — DEXAMETHASONE SODIUM PHOSPHATE 10 MG/ML IJ SOLN
10.0000 mg | Freq: Once | INTRAMUSCULAR | Status: AC
  Administered 2016-11-20: 10 mg via INTRAVENOUS

## 2016-11-20 MED ORDER — DIPHENHYDRAMINE HCL 25 MG PO CAPS
25.0000 mg | ORAL_CAPSULE | Freq: Once | ORAL | Status: AC
  Administered 2016-11-20: 25 mg via ORAL

## 2016-11-20 MED ORDER — HEPARIN SOD (PORK) LOCK FLUSH 100 UNIT/ML IV SOLN
500.0000 [IU] | Freq: Once | INTRAVENOUS | Status: AC | PRN
  Administered 2016-11-20: 500 [IU]
  Filled 2016-11-20: qty 5

## 2016-11-20 MED ORDER — SODIUM CHLORIDE 0.9 % IV SOLN
211.8000 mg | Freq: Once | INTRAVENOUS | Status: AC
  Administered 2016-11-20: 210 mg via INTRAVENOUS
  Filled 2016-11-20: qty 21

## 2016-11-20 MED ORDER — PALONOSETRON HCL INJECTION 0.25 MG/5ML
INTRAVENOUS | Status: AC
  Filled 2016-11-20: qty 5

## 2016-11-20 MED ORDER — DIPHENHYDRAMINE HCL 25 MG PO CAPS
ORAL_CAPSULE | ORAL | Status: AC
  Filled 2016-11-20: qty 1

## 2016-11-20 MED ORDER — PEGFILGRASTIM 6 MG/0.6ML ~~LOC~~ PSKT
6.0000 mg | PREFILLED_SYRINGE | Freq: Once | SUBCUTANEOUS | Status: AC
  Administered 2016-11-20: 6 mg via SUBCUTANEOUS
  Filled 2016-11-20: qty 0.6

## 2016-11-20 MED ORDER — ACETAMINOPHEN 325 MG PO TABS
ORAL_TABLET | ORAL | Status: AC
Start: 2016-11-20 — End: 2016-11-20
  Filled 2016-11-20: qty 2

## 2016-11-20 NOTE — Patient Instructions (Addendum)
[facility] Discharge Instructions for Patients Receiving Chemotherapy  Today you received the following chemotherapy agents Herceptin, Gemzar, and Carboplatin  To help prevent nausea and vomiting after your treatment, we encourage you to take your nausea medication as directed If you develop nausea and vomiting that is not controlled by your nausea medication, call the clinic.   BELOW ARE SYMPTOMS THAT SHOULD BE REPORTED IMMEDIATELY:  *FEVER GREATER THAN 100.5 F  *CHILLS WITH OR WITHOUT FEVER  NAUSEA AND VOMITING THAT IS NOT CONTROLLED WITH YOUR NAUSEA MEDICATION  *UNUSUAL SHORTNESS OF BREATH  *UNUSUAL BRUISING OR BLEEDING  TENDERNESS IN MOUTH AND THROAT WITH OR WITHOUT PRESENCE OF ULCERS  *URINARY PROBLEMS  *BOWEL PROBLEMS  UNUSUAL RASH Items with * indicate a potential emergency and should be followed up as soon as possible.  Feel free to call the clinic you have any questions or concerns. The clinic phone number is [phone].  Please show the [place] at check-in to the Emergency Department and triage nurse.

## 2016-11-23 ENCOUNTER — Encounter: Payer: Self-pay | Admitting: Radiation Oncology

## 2016-11-26 ENCOUNTER — Other Ambulatory Visit: Payer: Self-pay | Admitting: *Deleted

## 2016-11-26 ENCOUNTER — Telehealth: Payer: Self-pay | Admitting: *Deleted

## 2016-11-26 ENCOUNTER — Other Ambulatory Visit (HOSPITAL_BASED_OUTPATIENT_CLINIC_OR_DEPARTMENT_OTHER): Payer: Managed Care, Other (non HMO)

## 2016-11-26 DIAGNOSIS — C50412 Malignant neoplasm of upper-outer quadrant of left female breast: Secondary | ICD-10-CM | POA: Diagnosis not present

## 2016-11-26 DIAGNOSIS — Z170 Estrogen receptor positive status [ER+]: Principal | ICD-10-CM

## 2016-11-26 LAB — CBC WITH DIFFERENTIAL/PLATELET
BASO%: 0.3 % (ref 0.0–2.0)
BASOS ABS: 0.1 10*3/uL (ref 0.0–0.1)
EOS%: 0.4 % (ref 0.0–7.0)
Eosinophils Absolute: 0.1 10*3/uL (ref 0.0–0.5)
HCT: 28.7 % — ABNORMAL LOW (ref 34.8–46.6)
HGB: 9.4 g/dL — ABNORMAL LOW (ref 11.6–15.9)
LYMPH%: 13.2 % — AB (ref 14.0–49.7)
MCH: 31.2 pg (ref 25.1–34.0)
MCHC: 32.8 g/dL (ref 31.5–36.0)
MCV: 95.3 fL (ref 79.5–101.0)
MONO#: 0.6 10*3/uL (ref 0.1–0.9)
MONO%: 3 % (ref 0.0–14.0)
NEUT#: 15.7 10*3/uL — ABNORMAL HIGH (ref 1.5–6.5)
NEUT%: 83.1 % — AB (ref 38.4–76.8)
PLATELETS: 128 10*3/uL — AB (ref 145–400)
RBC: 3.01 10*6/uL — AB (ref 3.70–5.45)
RDW: 15.3 % — ABNORMAL HIGH (ref 11.2–14.5)
WBC: 18.9 10*3/uL — ABNORMAL HIGH (ref 3.9–10.3)
lymph#: 2.5 10*3/uL (ref 0.9–3.3)

## 2016-11-26 LAB — DRAW EXTRA CLOT TUBE

## 2016-11-26 NOTE — Telephone Encounter (Signed)
This RN called pt per lab values today and concern for noted " throbbing like my heartbeat in my ears"  This RN again noted lower heme then when pt started therapy as well as presently pt is at her [place] which can exacerbate symptoms.  Jamie [name] inquired if she should change her diet and any concerns with " walking outside "  This RN informed pt - balanced diet is good enough as well as exercise is good but she may need to pace herself due to if she exceeds or pushes herself is when she may have more severe fatigue with increased symptoms.  This RN validated with pt current lab values and her symptoms are known side effects of therapy.  No other questions at this time.

## 2016-11-27 ENCOUNTER — Other Ambulatory Visit: Payer: Managed Care, Other (non HMO)

## 2016-11-27 ENCOUNTER — Ambulatory Visit: Payer: Managed Care, Other (non HMO)

## 2016-11-30 ENCOUNTER — Encounter: Payer: Self-pay | Admitting: Oncology

## 2016-12-04 ENCOUNTER — Ambulatory Visit: Payer: Managed Care, Other (non HMO)

## 2016-12-04 ENCOUNTER — Ambulatory Visit (HOSPITAL_BASED_OUTPATIENT_CLINIC_OR_DEPARTMENT_OTHER): Payer: Managed Care, Other (non HMO)

## 2016-12-04 ENCOUNTER — Ambulatory Visit (HOSPITAL_BASED_OUTPATIENT_CLINIC_OR_DEPARTMENT_OTHER): Payer: Managed Care, Other (non HMO) | Admitting: Oncology

## 2016-12-04 ENCOUNTER — Other Ambulatory Visit: Payer: Self-pay

## 2016-12-04 ENCOUNTER — Other Ambulatory Visit (HOSPITAL_BASED_OUTPATIENT_CLINIC_OR_DEPARTMENT_OTHER): Payer: Managed Care, Other (non HMO)

## 2016-12-04 VITALS — BP 125/59 | HR 74 | Temp 98.2°F | Resp 20 | Ht 66.0 in | Wt 151.8 lb

## 2016-12-04 DIAGNOSIS — Z170 Estrogen receptor positive status [ER+]: Principal | ICD-10-CM

## 2016-12-04 DIAGNOSIS — C50412 Malignant neoplasm of upper-outer quadrant of left female breast: Secondary | ICD-10-CM

## 2016-12-04 DIAGNOSIS — Z5111 Encounter for antineoplastic chemotherapy: Secondary | ICD-10-CM

## 2016-12-04 DIAGNOSIS — Z95828 Presence of other vascular implants and grafts: Secondary | ICD-10-CM

## 2016-12-04 LAB — COMPREHENSIVE METABOLIC PANEL
ALBUMIN: 3.8 g/dL (ref 3.5–5.0)
ALK PHOS: 98 U/L (ref 40–150)
ALT: 98 U/L — ABNORMAL HIGH (ref 0–55)
ANION GAP: 10 meq/L (ref 3–11)
AST: 52 U/L — ABNORMAL HIGH (ref 5–34)
BUN: 10.4 mg/dL (ref 7.0–26.0)
CO2: 24 mEq/L (ref 22–29)
Calcium: 9.6 mg/dL (ref 8.4–10.4)
Chloride: 108 mEq/L (ref 98–109)
Creatinine: 0.8 mg/dL (ref 0.6–1.1)
EGFR: 83 mL/min/{1.73_m2} — AB (ref >90)
GLUCOSE: 91 mg/dL (ref 70–140)
POTASSIUM: 4.2 meq/L (ref 3.5–5.1)
Sodium: 143 mEq/L (ref 136–145)
Total Bilirubin: <0.22 mg/dL (ref 0.20–1.20)
Total Protein: 6.6 g/dL (ref 6.4–8.3)

## 2016-12-04 LAB — CBC WITH DIFFERENTIAL/PLATELET
BASO%: 0.5 % (ref 0.0–2.0)
Basophils Absolute: 0.1 10*3/uL (ref 0.0–0.1)
EOS%: 1.7 % (ref 0.0–7.0)
Eosinophils Absolute: 0.2 10*3/uL (ref 0.0–0.5)
HEMATOCRIT: 29.3 % — AB (ref 34.8–46.6)
HGB: 9.4 g/dL — ABNORMAL LOW (ref 11.6–15.9)
LYMPH#: 1.9 10*3/uL (ref 0.9–3.3)
LYMPH%: 19.4 % (ref 14.0–49.7)
MCH: 31.2 pg (ref 25.1–34.0)
MCHC: 32.1 g/dL (ref 31.5–36.0)
MCV: 97.3 fL (ref 79.5–101.0)
MONO#: 0.8 10*3/uL (ref 0.1–0.9)
MONO%: 7.6 % (ref 0.0–14.0)
NEUT%: 70.8 % (ref 38.4–76.8)
NEUTROS ABS: 7.1 10*3/uL — AB (ref 1.5–6.5)
Platelets: 255 10*3/uL (ref 145–400)
RBC: 3.01 10*6/uL — ABNORMAL LOW (ref 3.70–5.45)
RDW: 18.4 % — AB (ref 11.2–14.5)
WBC: 10 10*3/uL (ref 3.9–10.3)

## 2016-12-04 MED ORDER — VENLAFAXINE HCL ER 75 MG PO CP24: 75.0000 mg | ORAL_CAPSULE | Freq: Every day | ORAL | 6 refills | Status: DC

## 2016-12-04 MED ORDER — HEPARIN SOD (PORK) LOCK FLUSH 100 UNIT/ML IV SOLN
500.0000 [IU] | Freq: Once | INTRAVENOUS | Status: AC | PRN
  Administered 2016-12-04: 500 [IU]
  Filled 2016-12-04: qty 5

## 2016-12-04 MED ORDER — SODIUM CHLORIDE 0.9 % IV SOLN
1000.0000 mg/m2 | Freq: Once | INTRAVENOUS | Status: AC
  Administered 2016-12-04: 1786 mg via INTRAVENOUS
  Filled 2016-12-04: qty 46.97

## 2016-12-04 MED ORDER — ACETAMINOPHEN 325 MG PO TABS: 650.0000 mg | ORAL_TABLET | Freq: Once | ORAL | Status: DC

## 2016-12-04 MED ORDER — SODIUM CHLORIDE 0.9% FLUSH
10.0000 mL | INTRAVENOUS | Status: DC | PRN
  Administered 2016-12-04: 10 mL
  Filled 2016-12-04: qty 10

## 2016-12-04 MED ORDER — DIPHENHYDRAMINE HCL 25 MG PO CAPS: 25.0000 mg | ORAL_CAPSULE | Freq: Once | ORAL | Status: DC

## 2016-12-04 MED ORDER — PALONOSETRON HCL INJECTION 0.25 MG/5ML
0.2500 mg | Freq: Once | INTRAVENOUS | Status: AC
  Administered 2016-12-04: 0.25 mg via INTRAVENOUS

## 2016-12-04 MED ORDER — SODIUM CHLORIDE 0.9 % IV SOLN
Freq: Once | INTRAVENOUS | Status: AC
  Administered 2016-12-04: 10:00:00 via INTRAVENOUS

## 2016-12-04 MED ORDER — TRASTUZUMAB CHEMO 150 MG IV SOLR: 6.0000 mg/kg | Freq: Once | INTRAVENOUS | Status: DC

## 2016-12-04 MED ORDER — PALONOSETRON HCL INJECTION 0.25 MG/5ML
INTRAVENOUS | Status: AC
  Filled 2016-12-04: qty 5

## 2016-12-04 MED ORDER — DEXAMETHASONE SODIUM PHOSPHATE 10 MG/ML IJ SOLN
10.0000 mg | Freq: Once | INTRAMUSCULAR | Status: AC
  Administered 2016-12-04: 10 mg via INTRAVENOUS

## 2016-12-04 MED ORDER — DEXAMETHASONE SODIUM PHOSPHATE 10 MG/ML IJ SOLN
INTRAMUSCULAR | Status: AC
  Filled 2016-12-04: qty 1

## 2016-12-04 MED ORDER — SODIUM CHLORIDE 0.9 % IV SOLN
211.8000 mg | Freq: Once | INTRAVENOUS | Status: AC
  Administered 2016-12-04: 210 mg via INTRAVENOUS
  Filled 2016-12-04: qty 21

## 2016-12-04 MED ORDER — SODIUM CHLORIDE 0.9% FLUSH
10.0000 mL | Freq: Once | INTRAVENOUS | Status: AC
  Administered 2016-12-04: 10 mL
  Filled 2016-12-04: qty 10

## 2016-12-04 NOTE — Progress Notes (Signed)
Nutrition Assessment   Reason for Assessment:   Patient identified on Malnutrition Screening Tool for weight loss  ASSESSMENT:  61 year old female with left breast cancer currently receiving chemotherapy carboplatin and gemcitabine.  Planning to start radiation in August.  Past medical history of anxiety, basal cell carcinoma, HLD  Spoke with patient and husband during infusion this am.  Patient reports that her appetite is up and down but mainly has been good overall.  She does not want to gain to much weight because she is not as active as she has been in the past.  Patient reports that she has had issues with constipation and diarrhea but controlled with medication.  She tends to have more problems with constipation than with diarrhea.  Reports some nausea but taking medications seems to help that.    Reports that she has been eating a varied diet, has noticed dairy products tend to turn her stomach after receiving chemotherapy.    Nutrition Focused Physical Exam: deferred  Medications: reviewed  Labs: reviewed  Anthropometrics:   Height: 66 inches Weight: 151 lb 12.8 oz UBW: 150s  BMI: 24  Overall stable weight during treatment  NUTRITION DIAGNOSIS: Food and nutrition related knowledge deficit related to cancer and cancer related treatment as evidenced by patient with questions regarding nutrition and ways to relieve constipation.   INTERVENTION:   Discussed overall healthy diet encouraging good sources of protein, fruits, vegetables and whole grains.   Discussed constipation as patient with questions. Fact sheet given. Contact information provided and patient knows to contact me with questions or concerns    MONITORING, EVALUATION, GOAL: Patient will consume adequate calories and protein to maintain weight   NEXT VISIT: as needed.  Patient to contact me  Ciaran Begay B. Zenia Resides, [place], [place] Registered Dietitian [phone] (pager)

## 2016-12-04 NOTE — Progress Notes (Signed)
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 This showed a possible mass in the left breast. On 07/02/2016 she underwent left diagnostic mammography with tomography and left breast ultrasonography. The breast density was category C. In the left breast upper outer quadrant there was a microlobulated mass which was not directly palpable, although there was slight thickening in the left breast 1:00 position. Targeted ultrasonography confirmed a solid hypoechoic microlobulated mass in the left breast 1:00 radiant 4 cm from the nipple, measuring 0.8 cm.  On 07/03/2016 Jamie [name] underwent biopsy of the left breast mass in question, and this showed ([id number]) invasive ductal carcinoma, grade 2 or 3, with extracellular mucin, estrogen receptor 5% positive with weak staining intensity, progesterone receptor negative, with an MIB-1 of 50%, and HER-2 amplified, the signals ratio being 5.71 and the number per cell 14.55.  Her subsequent history is as detailed below  INTERVAL HISTORY: Jamie [name] returns today for follow-up and treatment of her (weakly positive) estrogen receptor positive breast cancer, strongly HER-2 positive accompanied by her  husband. Today is day 1 cycle 4 of 5-1/2 planned cycles of carboplatin and gemcitabine which she receives days 1 and 8 of each 21 day cycle. She initially received 1 dose of Taxol but this was discontinued because of peripheral neuropathy. The plan is for her to receive a total of 11 doses of carboplatin and gemcitabine and today is dose #7.  Her most recent echocardiogram was 10/22/2016 and this showed an ejection fraction in the 60-65% range.   REVIEW OF SYSTEMS: Jamie [name] has had multiple symptoms related to her treatment to the point that she is considering discontinuing chemotherapy. She has a throbbing in her years. She feels dizzy and lightheaded when she stands. This resolves with walking. She has headaches almost every day. They are bitemporal. She takes long acting Tylenol for this. Her fatigue is lasting longer and longer after each treatment. She has had a couple of mouth sores. Sometimes swallowing is painful. Her tongue can feel "flat". Her fingertips are tender and peeling. She feels very emotional and depressed. She feels she is a prisoner in her own home since she is afraid of going out because of concerns regarding low counts. She is afraid that she if she becomes more anemic she will need a transfusion and she really does not want that. She has pain in the surgical area sometimes. She has now a mixture of constipation and loose bowel movements which is new for her and somewhat disconcerting. She has hot flashes. A detailed review of systems today was otherwise stable  PAST MEDICAL HISTORY: Past Medical History:  Diagnosis Date  . Anxiety   . Arthritis    feet  . Basal cell carcinoma   . Breast cancer ([place]) 06/2016   left breast  . Eczema   . Genetic testing 08/15/2016   Ms.  Haen underwent genetic testing for hereditary cancer syndrome through [facility]'s 43-gene Common Hereditary Cancers Panel. Ms. 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 Result report is dated 08/15/2016. Please see genetic counseling documentation from 08/17/2016 for further discussion.  [name] PONV (postoperative nausea and vomiting)     PAST SURGICAL HISTORY: Past Surgical History:  Procedure Laterality Date  . BONE SPUR  2001 AND 1988  . BREAST LUMPECTOMY WITH RADIOACTIVE SEED AND SENTINEL LYMPH NODE BIOPSY Left 07/26/2016   Procedure: BREAST LUMPECTOMY WITH RADIOACTIVE SEED AND SENTINEL LYMPH NODE BIOPSY;  Surgeon: [name], MD;  Location: [place];  Service: General;  Laterality: Left;  . BUNIONECTOMY  10/13  . COLONOSCOPY W/ POLYPECTOMY  08/2007  . DILATION AND CURETTAGE OF UTERUS  2002  . MOHS SURGERY  2010  . PORTACATH PLACEMENT Right 07/26/2016   Procedure: INSERTION PORT-A-CATH;  Surgeon: [name], MD;  Location: [facility];  Service: General;  Laterality: Right;    FAMILY HISTORY Family History  Problem Relation Age of Onset  . Ovarian cancer Mother 39  . Hypertension Father   . Stroke Father   . Heart disease Father   . Breast cancer Maternal Aunt 77       recurred at 58  . Heart disease Paternal Grandmother   . Osteoporosis Sister   . Liver cancer Maternal Uncle 39  The patient's father died at age 67, with some form of skin cancer, most likely melanoma. The patient's mother died at the age of 47 with ovarian cancer, which had been diagnosed a few months prior. The patient had no brothers, 1 sister. A maternal aunt was diagnosed with breast cancer at age 71, recurrent age 75.  GYNECOLOGIC HISTORY:  No LMP recorded. Patient is postmenopausal. Menarche age 73, the patient is GX P0. She stopped having periods approximately age 57. 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 Her husband Jamie [name] worked as a Dance movement psychotherapist for a [employer]. Their last name is pronounced foh-TEE-ah and  they tell me it means "burning" in [place]. Their children are adopted. Jamie [name] lives in [place] and works in Press photographer, and [place] lives in [place] and is an Scientist, water quality. The patient has 3 grandchildren. She is not a Ambulance person.    ADVANCED DIRECTIVES: In place   HEALTH MAINTENANCE: Social History  Substance Use Topics  . Smoking status: Never Smoker  . Smokeless tobacco: Never Used  . Alcohol use 0.0 oz/week     Comment: 1-2     Colonoscopy:2009  YQM:GNOIBB 2018  Bone density:April 2016   Allergies  Allergen Reactions  . Codeine Nausea Only and Other (See Comments)    Dizzy  . Sulfamethoxazole Nausea And Vomiting  . Adhesive [Tape]     Current Outpatient Prescriptions  Medication Sig Dispense Refill  . CALCIUM PO Take 500 mg by mouth 2 (two) times daily. Skeletal strength 350m Calium    . cetirizine (ZYRTEC) 10 MG tablet Take 10 mg by mouth daily.    . Cholecalciferol (VITAMIN D3) 1000 units CAPS Take 1 capsule by mouth daily.    . cholestyramine (QUESTRAN) 4 g packet Take 1 packet (4 g total) by mouth 2 (two) times daily. Until diarrhea resolves 14 each 0  . Cyanocobalamin (B-12) 2500 MCG TABS Take by mouth.    . dexamethasone (DECADRON)  4 MG tablet Take 2 tablets (8 mg total) by mouth daily. Start the day after chemotherapy for 2 days. Take with food. 30 tablet 1  . diphenhydrAMINE (BENADRYL) 25 mg capsule Take 25 mg by mouth every 6 (six) hours as needed.    . Lactobacillus (ACIDOPHILUS PROBIOTIC PO) Take by mouth daily.    [name] lidocaine-prilocaine (EMLA) cream Apply one application to port 1-2 hours prior to access. Cover with saran wrap. 30 g 3  . LORazepam (ATIVAN) 0.5 MG tablet Take 1 tablet (0.5 mg total) by mouth at bedtime as needed (Nausea or vomiting). (Patient not taking: Reported on 10/22/2016) 30 tablet 0  . Melatonin 3 MG TABS Take 6 mg by mouth as needed.    . Multiple Vitamin (MULTIVITAMIN) tablet Take 1 tablet by mouth daily.    . prochlorperazine  (COMPAZINE) 10 MG tablet Take 10 mg by mouth every 6 (six) hours as needed.    . SYMBICORT 160-4.5 MCG/ACT inhaler INL 2 PFS ITL BID  1   No current facility-administered medications for this visit.     OBJECTIVE: Middle-aged white woman Who appears stated age  61:   12/04/16 0829  BP: (!) 125/59  Pulse: 74  Resp: 20  Temp: 98.2 F (36.8 C)     Body mass index is 24.5 kg/m.   Filed Weights   12/04/16 0829  Weight: 151 lb 12.8 oz (68.9 kg)     ECOG FS:0 - Asymptomatic  Sclerae unicteric, EOMs intact Oropharynx clear and moist No cervical or supraclavicular adenopathy Lungs no rales or rhonchi Heart regular rate and rhythm Abd soft, nontender, positive bowel sounds MSK no focal spinal tenderness, no upper extremity lymphedema Neuro: nonfocal, well oriented, appropriate affect Breasts: Deferred   LAB RESULTS:  CMP     Component Value Date/Time   NA 143 12/04/2016 0808   K 4.2 12/04/2016 0808   CL 100 08/04/2015 0831   CO2 24 12/04/2016 0808   GLUCOSE 91 12/04/2016 0808   BUN 10.4 12/04/2016 0808   CREATININE 0.8 12/04/2016 0808   CALCIUM 9.6 12/04/2016 0808   PROT 6.6 12/04/2016 0808   ALBUMIN 3.8 12/04/2016 0808   AST 52 (H) 12/04/2016 0808   ALT 98 (H) 12/04/2016 0808   ALKPHOS 98 12/04/2016 0808   BILITOT <0.22 12/04/2016 0808   GFRNONAA 86 08/04/2015 0831   GFRAA >89 08/04/2015 0831    INo results found for: SPEP, UPEP  Lab Results  Component Value Date   WBC 10.0 12/04/2016   NEUTROABS 7.1 (H) 12/04/2016   HGB 9.4 (L) 12/04/2016   HCT 29.3 (L) 12/04/2016   MCV 97.3 12/04/2016   PLT 255 12/04/2016      Chemistry      Component Value Date/Time   NA 143 12/04/2016 0808   K 4.2 12/04/2016 0808   CL 100 08/04/2015 0831   CO2 24 12/04/2016 0808   BUN 10.4 12/04/2016 0808   CREATININE 0.8 12/04/2016 0808   GLU 73 08/02/2014      Component Value Date/Time   CALCIUM 9.6 12/04/2016 0808   ALKPHOS 98 12/04/2016 0808   AST 52 (H)  12/04/2016 0808   ALT 98 (H) 12/04/2016 0808   BILITOT <0.22 12/04/2016 0808       No results found for: LABCA2  No components found for: QJJHE174  No results for input(s): INR in the last 168 hours.  Urinalysis No results found for: COLORURINE, APPEARANCEUR, [place], [place], [place], [place], [place], [place], [place], [place], NITRITE, LEUKOCYTESUR  STUDIES: No results found.   ELIGIBLE FOR AVAILABLE RESEARCH PROTOCOL: no  ASSESSMENT: 61 y.o. Jamie [name], [name] woman status post biopsy of the left breast upper outer quadrant lesion 07/03/2016 showing a clinical T1b N0, stage 1B invasive ductal carcinoma, grade 2 or 3, estrogen receptor weakly positive at 5%, progesterone receptor negative, but HER-2 strongly amplified, with an MIB-1 of 50%.  (1) status post left lumpectomy with sentinel lymph node sampling 07/26/2016 for a pT1c pN0, stage IA invasive ductal carcinoma, grade 3, with extracellular mucin, and negative margins  (2) chemotherapy consisting of Paclitaxel weekly starting 09/18/2016, discontinued after one cycle due to peripheral neuropathy.   (a) Gemcitabine and Carboplatin started 10/02/2016, repeated days 1 and 8 of each 21 day cycle to a total of 8 doses (4 cycles).  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   (a) associated with increased colorectal cancer risk, possibly breast cancer (at least in Sephardic Jews)     PLAN: Jamie [name]  generally has tolerated her chemotherapy well in the sense that she has not developed any side effects that are likely to be permanent. However she has developed an overwhelming number of side effects which, while each is minor, have pretty much Dr. down. There is no way she is going to be able to complete 5-1/2 cycles of the current medication.  On the other hand we want to make sure she does receive enough chemotherapy that she and I will not have to worry about recurrence. Many patients who are not able to complete the full prescribed chemotherapy doses do have anxiety regarding this and that can be disabling in itself.  My own feeling after reviewing the data with her in detail is that I would be comfortable if she can complete the fourth cycle of carboplatin and gemcitabine. We are trying to use this as an equivalent to 12 doses of Taxol, but this is a more intensive regimen, and in any case many patients are not able to receive the full 12 doses of Taxol because of concerns regarding peripheral neuropathy.  Accordingly the plan is for her to receive today's treatment and next week's treatment, and then no further chemotherapy.  We discussed the fact that many of my patients do experience posttraumatic stress at the completion of chemotherapy and that that. Can be much more difficult than the actual side effects of chemotherapy. I suggested we give venlafaxine a try. She is agreeable. I have written for this 75 mg dose. She may not need any more than that, but if she is not feeling significantly more in control within 3 or 4 weeks we will go to the 150 mg dose.  She already has an appointment with Dr. Sondra Come for the first week in August. She will be able to start her radiation treatments by mid-August. We did discuss some of the possible toxicities, side effects and complications of those treatments, which are generally much better tolerated than the chemotherapy  With all these changes Anaira feels  she is going to be able to complete her treatment as now planned.  She knows to call for any problems that may develop before her next visit here.  [name]  12/04/2016 9:08 AM Medical Oncology and Hematology [facility] [address] [place], [place] [address] Tel. [phone]    Fax. [phone]

## 2016-12-04 NOTE — Patient Instructions (Signed)
Implanted Port Home Guide An implanted port is a type of central line that is placed under the skin. Central lines are used to provide IV access when treatment or nutrition needs to be given through a person's veins. Implanted ports are used for long-term IV access. An implanted port may be placed because:  You need IV medicine that would be irritating to the small veins in your hands or arms.  You need long-term IV medicines, such as antibiotics.  You need IV nutrition for a long period.  You need frequent blood draws for lab tests.  You need dialysis.  Implanted ports are usually placed in the chest area, but they can also be placed in the upper arm, the abdomen, or the leg. An implanted port has two main parts:  Reservoir. The reservoir is round and will appear as a small, raised area under your skin. The reservoir is the part where a needle is inserted to give medicines or draw blood.  Catheter. The catheter is a thin, flexible tube that extends from the reservoir. The catheter is placed into a large vein. Medicine that is inserted into the reservoir goes into the catheter and then into the vein.  How will I care for my incision site? Do not get the incision site wet. Bathe or shower as directed by your health care provider. How is my port accessed? Special steps must be taken to access the port:  Before the port is accessed, a numbing cream can be placed on the skin. This helps numb the skin over the port site.  Your health care provider uses a sterile technique to access the port. ? Your health care provider must put on a mask and sterile gloves. ? The skin over your port is cleaned carefully with an antiseptic and allowed to dry. ? The port is gently pinched between sterile gloves, and a needle is inserted into the port.  Only "non-coring" port needles should be used to access the port. Once the port is accessed, a blood return should be checked. This helps ensure that the port  is in the vein and is not clogged.  If your port needs to remain accessed for a constant infusion, a clear (transparent) bandage will be placed over the needle site. The bandage and needle will need to be changed every week, or as directed by your health care provider.  Keep the bandage covering the needle clean and dry. Do not get it wet. Follow your health care provider's instructions on how to take a shower or bath while the port is accessed.  If your port does not need to stay accessed, no bandage is needed over the port.  What is flushing? Flushing helps keep the port from getting clogged. Follow your health care provider's instructions on how and when to flush the port. Ports are usually flushed with saline solution or a medicine called heparin. The need for flushing will depend on how the port is used.  If the port is used for intermittent medicines or blood draws, the port will need to be flushed: ? After medicines have been given. ? After blood has been drawn. ? As part of routine maintenance.  If a constant infusion is running, the port may not need to be flushed.  How long will my port stay implanted? The port can stay in for as long as your health care provider thinks it is needed. When it is time for the port to come out, surgery will be   done to remove it. The procedure is similar to the one performed when the port was put in. When should I seek immediate medical care? When you have an implanted port, you should seek immediate medical care if:  You notice a bad smell coming from the incision site.  You have swelling, redness, or drainage at the incision site.  You have more swelling or pain at the port site or the surrounding area.  You have a fever that is not controlled with medicine.  This information is not intended to replace advice given to you by your health care provider. Make sure you discuss any questions you have with your health care provider. Document  Released: 05/07/2005 Document Revised: 10/13/2015 Document Reviewed: 01/12/2013 Elsevier Interactive Patient Education  2017 Elsevier Inc.  

## 2016-12-04 NOTE — Patient Instructions (Signed)
[facility] Discharge Instructions for Patients Receiving Chemotherapy  Today you received the following chemotherapy agents: Gemzar and Carboplatin   To help prevent nausea and vomiting after your treatment, we encourage you to take your nausea medication as directed.    If you develop nausea and vomiting that is not controlled by your nausea medication, call the clinic.   BELOW ARE SYMPTOMS THAT SHOULD BE REPORTED IMMEDIATELY:  *FEVER GREATER THAN 100.5 F  *CHILLS WITH OR WITHOUT FEVER  NAUSEA AND VOMITING THAT IS NOT CONTROLLED WITH YOUR NAUSEA MEDICATION  *UNUSUAL SHORTNESS OF BREATH  *UNUSUAL BRUISING OR BLEEDING  TENDERNESS IN MOUTH AND THROAT WITH OR WITHOUT PRESENCE OF ULCERS  *URINARY PROBLEMS  *BOWEL PROBLEMS  UNUSUAL RASH Items with * indicate a potential emergency and should be followed up as soon as possible.  Feel free to call the clinic you have any questions or concerns. The clinic phone number is [phone].  Please show the CHEMO ALERT CARD at check-in to the Emergency Department and triage nurse.   

## 2016-12-04 NOTE — Progress Notes (Signed)
CMET reviewed by Dr. [name]: OK to treat despite ALT 98, AST 52.

## 2016-12-05 ENCOUNTER — Telehealth: Payer: Self-pay

## 2016-12-05 ENCOUNTER — Encounter: Payer: Self-pay | Admitting: Oncology

## 2016-12-05 NOTE — Telephone Encounter (Signed)
vm received from pt stating that per her last discussion with Dr [name] her last chemo treatment would be on 4/24 and that her chemo treatment for 8/7 would be cancelled.  Pt states in msg that she does not want to cancel the infusion appt for 8/7.  Upon investigation this RN sees that the infusion appt for 8/7 has already been cancelled per Dr [name].  LVM for pt informing her that when she sees Dr [name] on 7/24 they can discuss adding back the infusion appt on 8/7 if he is in aggreeance.  Pt instructed to return my call for any questions, concerns or need for clarification.

## 2016-12-07 ENCOUNTER — Encounter: Payer: Self-pay | Admitting: Oncology

## 2016-12-07 ENCOUNTER — Telehealth: Payer: Self-pay

## 2016-12-07 NOTE — Telephone Encounter (Signed)
msg sent to scheduling to add in an infusion appt for 8/7.  Pt already has an infusion scheduled for 8/14

## 2016-12-10 NOTE — Progress Notes (Signed)
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 This showed a possible mass in the left breast. On 07/02/2016 she underwent left diagnostic mammography with tomography and left breast ultrasonography. The breast density was category C. In the left breast upper outer quadrant there was a microlobulated mass which was not directly palpable, although there was slight thickening in the left breast 1:00 position. Targeted ultrasonography confirmed a solid hypoechoic microlobulated mass in the left breast 1:00 radiant 4 cm from the nipple, measuring 0.8 cm.  On 07/03/2016 Jamie [name] underwent biopsy of the left breast mass in question, and this showed ([id number]) invasive ductal carcinoma, grade 2 or 3, with extracellular mucin, estrogen receptor 5% positive with weak staining intensity, progesterone receptor negative, with an MIB-1 of 50%, and HER-2 amplified, the signals ratio being 5.71 and the number per cell 14.55.  Her subsequent history is as detailed below  INTERVAL HISTORY: Jamie [name] returns today for follow-up and treatment of her estrogen receptor weakly positive breast cancer, accompanied by her husband..  Today is day 8 cycle 4 of 4 planned cycles of carboplatin and gemcitabine which she receives day 1 and 8 of each 21 days. Recall she initially received 1 dose of Taxol discontinued because of concerns regarding neuropathy.  She also receives trastuzumab every 21 days. She [name] be due for repeat echocardiogram in September.   REVIEW OF SYSTEMS: Jamie [name] did well with her oral treatment last week and continues to be very active with her family work at home and volunteer activities. She is moderately constipated. She continues to have significant hot flashes. She tells me Neurontin helps at night. Otherwise a detailed review of systems today was stable  PAST MEDICAL HISTORY: Past Medical History:  Diagnosis Date  . Anxiety   . Arthritis    feet  . Basal cell carcinoma   . Breast cancer ([place]) 06/2016   left breast  . Eczema   . Genetic testing 08/15/2016   Jamie [name] underwent genetic testing for hereditary cancer syndrome through [facility]'s 43-gene Common Hereditary Cancers Panel. 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 Result report is dated 08/15/2016. Please see genetic counseling documentation from 08/17/2016 for further discussion.  [name] PONV (postoperative nausea and vomiting)     PAST SURGICAL HISTORY: Past Surgical History:  Procedure Laterality Date  . BONE SPUR  2001 AND 1988  . BREAST LUMPECTOMY WITH RADIOACTIVE SEED AND SENTINEL LYMPH NODE BIOPSY Left 07/26/2016   Procedure: BREAST LUMPECTOMY WITH RADIOACTIVE SEED AND SENTINEL LYMPH NODE BIOPSY;  Surgeon: Jamie Klein, MD;  Location: [place];  Service: General;  Laterality: Left;  . BUNIONECTOMY  10/13  . COLONOSCOPY W/ POLYPECTOMY  08/2007  . DILATION AND CURETTAGE OF UTERUS  2002  . MOHS SURGERY  2010  .  PORTACATH PLACEMENT Right 07/26/2016   Procedure: INSERTION PORT-A-CATH;  Surgeon: Jamie Klein, MD;  Location: [place];  Service: General;  Laterality: Right;    FAMILY HISTORY Family History  Problem Relation Age of Onset  . Ovarian cancer Mother 39  . Hypertension Father   . Stroke Father   . Heart disease Father   . Breast cancer Maternal Aunt 77       recurred at 3  . Heart disease Paternal Grandmother   . Osteoporosis Sister   . Liver cancer Maternal Uncle 15  The patient's father died at age 76, with some form of skin cancer, most likely melanoma. The patient's mother died at the age of 38 with ovarian cancer, which had been diagnosed a few months prior. The patient had no brothers, 1 sister. A maternal aunt was diagnosed with breast cancer at age 66, recurrent age 27.  GYNECOLOGIC HISTORY:  No LMP recorded. Patient is postmenopausal. Menarche age 75, the patient is GX P0. She stopped having periods approximately age 56. 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 Her husband Jamie [name] worked as a Dance movement psychotherapist for a [employer]. Their last name is pronounced foh-TEE-ah and they tell me it means "burning" in [place]. Their children are adopted. Jamie [name] lives in [place] and works in Press photographer, and [place]eta lives in [place] and is an Scientist, water quality. The patient has 3 grandchildren. She is not a Ambulance person.    ADVANCED DIRECTIVES: In place   HEALTH MAINTENANCE: Social History  Substance Use Topics  . Smoking status: Never Smoker  . Smokeless tobacco: Never Used  . Alcohol use 0.0 oz/week     Comment: 1-2     Colonoscopy:2009  ZOX:WRUEAV 2018  Bone density:April 2016   Allergies  Allergen Reactions  . Codeine Nausea Only and Other (See Comments)    Dizzy  . Sulfamethoxazole Nausea And Vomiting  . Adhesive [Tape]     Current Outpatient Prescriptions  Medication Sig Dispense Refill  . CALCIUM PO Take 500 mg by mouth 2 (two) times daily. Skeletal strength 368m Calium    . cetirizine (ZYRTEC) 10 MG  tablet Take 10 mg by mouth daily.    . Cholecalciferol (VITAMIN D3) 1000 units CAPS Take 1 capsule by mouth daily.    . cholestyramine (QUESTRAN) 4 g packet Take 1 packet (4 g total) by mouth 2 (two) times daily. Until diarrhea resolves 14 each 0  . Cyanocobalamin (B-12) 2500 MCG TABS Take by mouth.    . dexamethasone (DECADRON) 4 MG tablet Take 2 tablets (8 mg total) by mouth daily. Start the day after chemotherapy for 2 days. Take with food. 30 tablet 1  . diphenhydrAMINE (BENADRYL) 25 mg capsule Take 25 mg by mouth every 6 (six) hours as needed.    . Lactobacillus (ACIDOPHILUS PROBIOTIC PO) Take by mouth daily.    .Marland Kitchenlidocaine-prilocaine (EMLA) cream Apply one application to port 1-2 hours prior to access. Cover with saran wrap. 30 g 3  . LORazepam (ATIVAN) 0.5 MG tablet Take 1 tablet (0.5 mg total) by mouth at bedtime as needed (Nausea or vomiting). (Patient not taking: Reported on 10/22/2016) 30 tablet 0  . Melatonin 3 MG TABS Take 6 mg by mouth as needed.    . Multiple Vitamin (MULTIVITAMIN) tablet Take 1 tablet by mouth daily.    . prochlorperazine (COMPAZINE) 10 MG tablet Take 10 mg by  mouth every 6 (six) hours as needed.    . SYMBICORT 160-4.5 MCG/ACT inhaler INL 2 PFS ITL BID  1  . venlafaxine XR (EFFEXOR-XR) 75 MG 24 hr capsule Take 1 capsule (75 mg total) by mouth daily with breakfast. 30 capsule 6   No current facility-administered medications for this visit.     OBJECTIVE: Middle-aged white woman In no acute distress  There were no vitals filed for this visit.   There is no height or weight on file to calculate BMI.   There were no vitals filed for this visit.   ECOG FS:1 - Symptomatic but completely ambulatory  Sclerae unicteric, EOMs intact Oropharynx clear and moist No cervical or supraclavicular adenopathy Lungs no rales or rhonchi Heart regular rate and rhythm Abd soft, nontender, positive bowel sounds MSK no focal spinal tenderness, no upper extremity  lymphedema Neuro: nonfocal, well oriented, appropriate affect Breasts: Deferred   LAB RESULTS:  CMP     Component Value Date/Time   NA 140 12/11/2016 0807   K 4.2 12/11/2016 0807   CL 100 08/04/2015 0831   CO2 25 12/11/2016 0807   GLUCOSE 72 12/11/2016 0807   BUN 13.4 12/11/2016 0807   CREATININE 0.7 12/11/2016 0807   CALCIUM 9.5 12/11/2016 0807   PROT 6.8 12/11/2016 0807   ALBUMIN 3.8 12/11/2016 0807   AST 32 12/11/2016 0807   ALT 64 (H) 12/11/2016 0807   ALKPHOS 69 12/11/2016 0807   BILITOT 0.30 12/11/2016 0807   GFRNONAA 86 08/04/2015 0831   GFRAA >89 08/04/2015 0831    INo results found for: SPEP, UPEP  Lab Results  Component Value Date   WBC 2.4 (L) 12/11/2016   NEUTROABS 0.8 (L) 12/11/2016   HGB 9.2 (L) 12/11/2016   HCT 27.0 (L) 12/11/2016   MCV 95.2 12/11/2016   PLT 433 (H) 12/11/2016      Chemistry      Component Value Date/Time   NA 140 12/11/2016 0807   K 4.2 12/11/2016 0807   CL 100 08/04/2015 0831   CO2 25 12/11/2016 0807   BUN 13.4 12/11/2016 0807   CREATININE 0.7 12/11/2016 0807   GLU 73 08/02/2014      Component Value Date/Time   CALCIUM 9.5 12/11/2016 0807   ALKPHOS 69 12/11/2016 0807   AST 32 12/11/2016 0807   ALT 64 (H) 12/11/2016 0807   BILITOT 0.30 12/11/2016 0807       No results found for: LABCA2  No components found for: ENMMH680  No results for input(s): INR in the last 168 hours.  Urinalysis No results found for: COLORURINE, APPEARANCEUR, LABSPEC, PHURINE, GLUCOSEU, HGBUR, BILIRUBINUR, KETONESUR, PROTEINUR, UROBILINOGEN, NITRITE, LEUKOCYTESUR   STUDIES: No results found.   ELIGIBLE FOR AVAILABLE RESEARCH PROTOCOL: no  ASSESSMENT: 61 y.o. Jamie [name], [name] woman status post biopsy of the left breast upper outer quadrant lesion 07/03/2016 showing a clinical T1b N0, stage 1B invasive ductal carcinoma, grade 2 or 3, estrogen receptor weakly positive at 5%, progesterone receptor negative, but HER-2 strongly amplified,  with an MIB-1 of 50%.  (1) status post left lumpectomy with sentinel lymph node sampling 07/26/2016 for a pT1c pN0, stage IA invasive ductal carcinoma, grade 3, with extracellular mucin, and negative margins  (2) chemotherapy consisting of Paclitaxel weekly starting 09/18/2016, discontinued after one cycle due to peripheral neuropathy.   (a) Gemcitabine and Carboplatin started 10/02/2016, repeated days 1 and 8 of each 21 day cycle to a total of 8 doses (4 cycles).  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   (a) associated with increased colorectal cancer risk, possibly breast cancer (at least in Sephardic Jews)     PLAN: Jamie [name] not be able to be treated today and she [name] return in one week to receive her final dose of chemotherapy. Distal make sure her counts are okay she [name] receive Neupogen on Thursday or Friday of this week.  After her last discussion she had decided she did indeed want to pursue the final fifth cycle of treatment. I reassured her that I felt very secure with her receiving 4 cycles of the current therapy plus the earlier Taxol treatment. After much discussion today she also is happy with that plan and therefore after next week's dose, which hopefully she [name] be able to receive, she [name] be done with chemotherapy and be able to proceed to radiation.  I'm going to see her again in a few weeks later just to make sure she is doing well since I find  posttraumatic stress symptoms are most intense in the period of time after chemotherapy.  She knows to call for any problems that may develop before her next visit.  Jamie [name]  12/11/2016 9:48 PM Medical Oncology and Hematology [facility] [address] [place], [place] [address] Tel. [phone]    Fax. [phone]

## 2016-12-11 ENCOUNTER — Encounter: Payer: Self-pay | Admitting: *Deleted

## 2016-12-11 ENCOUNTER — Other Ambulatory Visit (HOSPITAL_BASED_OUTPATIENT_CLINIC_OR_DEPARTMENT_OTHER): Payer: Managed Care, Other (non HMO)

## 2016-12-11 ENCOUNTER — Ambulatory Visit (HOSPITAL_BASED_OUTPATIENT_CLINIC_OR_DEPARTMENT_OTHER): Payer: Managed Care, Other (non HMO)

## 2016-12-11 ENCOUNTER — Ambulatory Visit (HOSPITAL_BASED_OUTPATIENT_CLINIC_OR_DEPARTMENT_OTHER): Payer: Managed Care, Other (non HMO) | Admitting: Oncology

## 2016-12-11 VITALS — BP 103/53 | HR 74 | Temp 98.4°F | Resp 18

## 2016-12-11 DIAGNOSIS — Z95828 Presence of other vascular implants and grafts: Secondary | ICD-10-CM

## 2016-12-11 DIAGNOSIS — K5900 Constipation, unspecified: Secondary | ICD-10-CM

## 2016-12-11 DIAGNOSIS — N951 Menopausal and female climacteric states: Secondary | ICD-10-CM

## 2016-12-11 DIAGNOSIS — Z170 Estrogen receptor positive status [ER+]: Principal | ICD-10-CM

## 2016-12-11 DIAGNOSIS — C50412 Malignant neoplasm of upper-outer quadrant of left female breast: Secondary | ICD-10-CM

## 2016-12-11 LAB — COMPREHENSIVE METABOLIC PANEL
ALBUMIN: 3.8 g/dL (ref 3.5–5.0)
ALK PHOS: 69 U/L (ref 40–150)
ALT: 64 U/L — ABNORMAL HIGH (ref 0–55)
ANION GAP: 9 meq/L (ref 3–11)
AST: 32 U/L (ref 5–34)
BILIRUBIN TOTAL: 0.3 mg/dL (ref 0.20–1.20)
BUN: 13.4 mg/dL (ref 7.0–26.0)
CALCIUM: 9.5 mg/dL (ref 8.4–10.4)
CO2: 25 meq/L (ref 22–29)
CREATININE: 0.7 mg/dL (ref 0.6–1.1)
Chloride: 105 mEq/L (ref 98–109)
EGFR: 90 mL/min/{1.73_m2} — AB (ref >90)
Glucose: 72 mg/dl (ref 70–140)
Potassium: 4.2 mEq/L (ref 3.5–5.1)
Sodium: 140 mEq/L (ref 136–145)
TOTAL PROTEIN: 6.8 g/dL (ref 6.4–8.3)

## 2016-12-11 LAB — CBC WITH DIFFERENTIAL/PLATELET
BASO%: 3 % — ABNORMAL HIGH (ref 0.0–2.0)
Basophils Absolute: 0.1 10*3/uL (ref 0.0–0.1)
EOS ABS: 0.1 10*3/uL (ref 0.0–0.5)
EOS%: 2.5 % (ref 0.0–7.0)
HEMATOCRIT: 27 % — AB (ref 34.8–46.6)
HGB: 9.2 g/dL — ABNORMAL LOW (ref 11.6–15.9)
LYMPH#: 1.4 10*3/uL (ref 0.9–3.3)
LYMPH%: 56.3 % — AB (ref 14.0–49.7)
MCH: 32.4 pg (ref 25.1–34.0)
MCHC: 34.1 g/dL (ref 31.5–36.0)
MCV: 95.2 fL (ref 79.5–101.0)
MONO#: 0.1 10*3/uL (ref 0.1–0.9)
MONO%: 3.6 % (ref 0.0–14.0)
NEUT#: 0.8 10*3/uL — ABNORMAL LOW (ref 1.5–6.5)
NEUT%: 34.6 % — ABNORMAL LOW (ref 38.4–76.8)
PLATELETS: 433 10*3/uL — AB (ref 145–400)
RBC: 2.83 10*6/uL — AB (ref 3.70–5.45)
RDW: 19.1 % — ABNORMAL HIGH (ref 11.2–14.5)
WBC: 2.4 10*3/uL — AB (ref 3.9–10.3)

## 2016-12-11 MED ORDER — SODIUM CHLORIDE 0.9% FLUSH
10.0000 mL | Freq: Once | INTRAVENOUS | Status: AC
  Administered 2016-12-11: 10 mL
  Filled 2016-12-11: qty 10

## 2016-12-11 MED ORDER — SODIUM CHLORIDE 0.9 % IV SOLN
INTRAVENOUS | Status: DC
  Administered 2016-12-11: 09:00:00 via INTRAVENOUS

## 2016-12-11 MED ORDER — HEPARIN SOD (PORK) LOCK FLUSH 100 UNIT/ML IV SOLN
500.0000 [IU] | Freq: Once | INTRAVENOUS | Status: AC
  Administered 2016-12-11: 500 [IU]
  Filled 2016-12-11: qty 5

## 2016-12-11 NOTE — Patient Instructions (Signed)
Dehydration, Adult Dehydration is a condition in which there is not enough fluid or water in the body. This happens when you lose more fluids than you take in. Important organs, such as the kidneys, brain, and heart, cannot function without a proper amount of fluids. Any loss of fluids from the body can lead to dehydration. Dehydration can range from mild to severe. This condition should be treated right away to prevent it from becoming severe. What are the causes? This condition may be caused by:  Vomiting.  Diarrhea.  Excessive sweating, such as from heat exposure or exercise.  Not drinking enough fluid, especially: ? When ill. ? While doing activity that requires a lot of energy.  Excessive urination.  Fever.  Infection.  Certain medicines, such as medicines that cause the body to lose excess fluid (diuretics).  Inability to access safe drinking water.  Reduced physical ability to get adequate water and food.  What increases the risk? This condition is more likely to develop in people:  Who have a poorly controlled long-term (chronic) illness, such as diabetes, heart disease, or kidney disease.  Who are age 65 or older.  Who are disabled.  Who live in a place with high altitude.  Who play endurance sports.  What are the signs or symptoms? Symptoms of mild dehydration may include:  Thirst.  Dry lips.  Slightly dry mouth.  Dry, warm skin.  Dizziness. Symptoms of moderate dehydration may include:  Very dry mouth.  Muscle cramps.  Dark urine. Urine may be the color of tea.  Decreased urine production.  Decreased tear production.  Heartbeat that is irregular or faster than normal (palpitations).  Headache.  Light-headedness, especially when you stand up from a sitting position.  Fainting (syncope). Symptoms of severe dehydration may include:  Changes in skin, such as: ? Cold and clammy skin. ? Blotchy (mottled) or pale skin. ? Skin that does  not quickly return to normal after being lightly pinched and released (poor skin turgor).  Changes in body fluids, such as: ? Extreme thirst. ? No tear production. ? Inability to sweat when body temperature is high, such as in hot weather. ? Very little urine production.  Changes in vital signs, such as: ? Weak pulse. ? Pulse that is more than 100 beats a minute when sitting still. ? Rapid breathing. ? Low blood pressure.  Other changes, such as: ? Sunken eyes. ? Cold hands and feet. ? Confusion. ? Lack of energy (lethargy). ? Difficulty waking up from sleep. ? Short-term weight loss. ? Unconsciousness. How is this diagnosed? This condition is diagnosed based on your symptoms and a physical exam. Blood and urine tests may be done to help confirm the diagnosis. How is this treated? Treatment for this condition depends on the severity. Mild or moderate dehydration can often be treated at home. Treatment should be started right away. Do not wait until dehydration becomes severe. Severe dehydration is an emergency and it needs to be treated in a hospital. Treatment for mild dehydration may include:  Drinking more fluids.  Replacing salts and minerals in your blood (electrolytes) that you may have lost. Treatment for moderate dehydration may include:  Drinking an oral rehydration solution (ORS). This is a drink that helps you replace fluids and electrolytes (rehydrate). It can be found at pharmacies and retail stores. Treatment for severe dehydration may include:  Receiving fluids through an IV tube.  Receiving an electrolyte solution through a feeding tube that is passed through your nose   and into your stomach (nasogastric tube, or NG tube).  Correcting any abnormalities in electrolytes.  Treating the underlying cause of dehydration. Follow these instructions at home:  If directed by your health care provider, drink an ORS: ? Make an ORS by following instructions on the  package. ? Start by drinking small amounts, about  cup (120 mL) every 5-10 minutes. ? Slowly increase how much you drink until you have taken the amount recommended by your health care provider.  Drink enough clear fluid to keep your urine clear or pale yellow. If you were told to drink an ORS, finish the ORS first, then start slowly drinking other clear fluids. Drink fluids such as: ? Water. Do not drink only water. Doing that can lead to having too little salt (sodium) in the body (hyponatremia). ? Ice chips. ? Fruit juice that you have added water to (diluted fruit juice). ? Low-calorie sports drinks.  Avoid: ? Alcohol. ? Drinks that contain a lot of sugar. These include high-calorie sports drinks, fruit juice that is not diluted, and soda. ? Caffeine. ? Foods that are greasy or contain a lot of fat or sugar.  Take over-the-counter and prescription medicines only as told by your health care provider.  Do not take sodium tablets. This can lead to having too much sodium in the body (hypernatremia).  Eat foods that contain a healthy balance of electrolytes, such as bananas, oranges, potatoes, tomatoes, and spinach.  Keep all follow-up visits as told by your health care provider. This is important. Contact a health care provider if:  You have abdominal pain that: ? Gets worse. ? Stays in one area (localizes).  You have a rash.  You have a stiff neck.  You are more irritable than usual.  You are sleepier or more difficult to wake up than usual.  You feel weak or dizzy.  You feel very thirsty.  You have urinated only a small amount of very dark urine over 6-8 hours. Get help right away if:  You have symptoms of severe dehydration.  You cannot drink fluids without vomiting.  Your symptoms get worse with treatment.  You have a fever.  You have a severe headache.  You have vomiting or diarrhea that: ? Gets worse. ? Does not go away.  You have blood or green matter  (bile) in your vomit.  You have blood in your stool. This may cause stool to look black and tarry.  You have not urinated in 6-8 hours.  You faint.  Your heart rate while sitting still is over 100 beats a minute.  You have trouble breathing. This information is not intended to replace advice given to you by your health care provider. Make sure you discuss any questions you have with your health care provider. Document Released: 05/07/2005 Document Revised: 12/02/2015 Document Reviewed: 07/01/2015 Elsevier Interactive Patient Education  2018 [employer].    Managing Low Blood Counts During Cancer Treatment Cancer treatments such as chemotherapy and radiation can sometimes cause a drop in the supply of blood cells in the body, including red blood cells, white blood cells, and platelets. These blood cells are produced in the body and are released into the blood to perform specific functions:  Red blood cells carry gases such as oxygen and carbon dioxide to and from your lungs.  White blood cells help protect you from infection.  Platelets help your body to form blood clots to prevent and control bleeding.  When cancer treatments cause a drop  in blood cell counts, your body may not have enough cells to keep up its normal functions. Symptoms or problems that may result will vary depending on which type of blood cells the treatment is affecting. If your blood counts are low, you can take steps to help manage any problems. How can low blood counts affect me? Low blood counts have various effects depending on the type of blood cells involved:  If you have a low number of red blood cells, you have a condition called anemia. This can cause symptoms such as: ? Feeling tired and weak. ? Feeling light-headed. ? Being short of breath.  If you have a low number of white blood cells, you may be at higher risk for infections.  If you have a low number of platelets, you may bleed more easily, or  your body may have trouble stopping any bleeding. You may also have more bruising.  How to manage symptoms or prevent problems from a low blood count If you have a low blood count, you can take steps to manage symptoms or prevent problems that may develop. The steps to take will depend on which type of blood cell is low. Low red blood cells Take these steps to help manage the symptoms of anemia:  Go for a walk or do some light exercise each day.  Take short naps during the day.  Eat foods that contain a lot of iron and protein. These include leafy vegetables, meat and fish, beans, sweet potatoes, and dried fruit such as prunes, raisins, and apricots.  Ask for help with errands and with work that needs to be done around the house. It is important to save your energy.  Take vitamins or supplements-such as iron, vitamin N23, or folic acid-as told by your health care provider.  Practice relaxation techniques, such as yoga or meditation.  Low white blood cells Take these steps to help prevent infections:  Wash your hands often with warm, soapy water.  Avoid crowds of people and any person who has the flu or a fever.  Take care when cleaning yourself after using the bathroom. Tell your health care provider if you have any rectal sores or bleeding.  Avoid dental work. Check your mouth each day for sores or signs of infection.  Do not share utensils.  Avoid contact with pet waste. Wash your hands after handling pets.  If you get a scrape or cut, clean it thoroughly right away.  Avoid fresh plants or dried flowers.  Do not swim or wade in lakes, ponds, rivers, water parks, or hot tubs.  Follow food safety guidelines. Cook meat thoroughly and wash all raw fruits and vegetables.  You may be instructed to wear a mask when around others to protect yourself.  Low platelets Take these steps to help prevent or control bleeding and bruising:  Use an electric razor for shaving instead of  a blade.  Use a soft toothbrush and be careful during oral care. Talk with your cancer care team about whether you should avoid flossing. If your mouth is bleeding, rinse it with ice water.  Avoid activities that could cause injury, such as contact sports.  Talk with your health care provider about using laxatives or stool softeners to avoid constipation.  Do not use medicines such as ibuprofen, aspirin, or naproxen unless your health care provider tells you to.  Limit alcohol use.  Monitor any bleeding closely. If you start bleeding, hold pressure on the area for 5 minutes  to stop the bleeding. Bleeding that does not stop is considered an emergency.  What treatments can help increase a low blood count? If needed, your health care provider may recommend treatment for a low blood count. Treatment will depend on the type of blood cell that is low and the severity of your condition. Treatment options may include:  Taking medicines to help stimulate the growth of blood cells. This is an option for treating a low red blood cell count. Your health care provider may also recommend that you take iron, folic acid, or vitamin B12 supplements.  Making dietary changes. Including more iron and protein in your diet can help stimulate the growth of red blood cells.  Adjusting your current medicines to help raise blood counts.  Making changes to your treatment plan.  Having a blood transfusion. This may be done if your blood count is very low.  Contact a health care provider if:  You feel extremely tired and weak.  You have more bruising or bleeding.  You feel ill or you develop a cough.  You have swelling or redness.  You have mouth sores or a sore throat.  You have painful urination or you have blood in your urine or stool.  You are thinking of taking any new supplements or vitamins or making dietary changes. Get help right away if:  You are short of breath, have chest pain, or feel  dizzy.  You have a fever or chills.  You have abdominal pain or diarrhea.  You have bleeding that will not stop. Summary  Cancer treatments such as chemotherapy and radiation can sometimes cause a drop in the supply of blood cells in the body, including red blood cells, white blood cells, and platelets.  If you have a low blood count, you can take steps to manage symptoms or prevent problems that may develop.  Depending on which type of blood cell is low, you may need to take steps to prevent infection, prevent bleeding, or manage symptoms that may develop.  If needed, your health care provider may recommend treatment for a low blood count. This information is not intended to replace advice given to you by your health care provider. Make sure you discuss any questions you have with your health care provider. Document Released: 12/31/2015 Document Revised: 12/31/2015 Document Reviewed: 12/31/2015 Elsevier Interactive Patient Education  [employer].

## 2016-12-11 NOTE — Progress Notes (Signed)
Patient states she gets short of breath after walking up one flight of stairs. Patient states SOB is not any worse than previous treatment and Dr. [name] is aware of the SOB.   Patient states she becomes lightheaded when standing after sitting. Orthostatic BP's obtained. Dr. [name] notified. Order given and carried out for 1 liter Normal saline over 2 hours. Patient verbalized understanding.   Hold treatment today due to anc 0.8 per Dr. [name]. Mask provided as well as neutropenic precautions discussed through teach back method.  Patient and spouse verbalized understanding.   Blood pressure 103/53 post IV fluids. Patient states when she went to the restroom she did not feel lightheaded upon standing.

## 2016-12-13 ENCOUNTER — Ambulatory Visit (HOSPITAL_BASED_OUTPATIENT_CLINIC_OR_DEPARTMENT_OTHER): Payer: Managed Care, Other (non HMO)

## 2016-12-13 VITALS — BP 122/73 | HR 84 | Temp 97.5°F | Resp 18

## 2016-12-13 DIAGNOSIS — Z5189 Encounter for other specified aftercare: Secondary | ICD-10-CM | POA: Diagnosis not present

## 2016-12-13 DIAGNOSIS — C50412 Malignant neoplasm of upper-outer quadrant of left female breast: Secondary | ICD-10-CM | POA: Diagnosis not present

## 2016-12-13 DIAGNOSIS — Z95828 Presence of other vascular implants and grafts: Secondary | ICD-10-CM

## 2016-12-13 DIAGNOSIS — Z170 Estrogen receptor positive status [ER+]: Secondary | ICD-10-CM

## 2016-12-13 MED ORDER — TBO-FILGRASTIM 300 MCG/0.5ML ~~LOC~~ SOSY
300.0000 ug | PREFILLED_SYRINGE | Freq: Once | SUBCUTANEOUS | Status: AC
  Administered 2016-12-13: 300 ug via SUBCUTANEOUS
  Filled 2016-12-13: qty 0.5

## 2016-12-13 NOTE — Patient Instructions (Signed)
Tbo-Filgrastim injection What is this medicine? TBO-FILGRASTIM (T B O fil GRA stim) is a granulocyte colony-stimulating factor that stimulates the growth of neutrophils, a type of white blood cell important in the body's fight against infection. It is used to reduce the incidence of fever and infection in patients with certain types of cancer who are receiving chemotherapy that affects the bone marrow. This medicine may be used for other purposes; ask your health care provider or pharmacist if you have questions. COMMON BRAND NAME(S): Granix What should I tell my health care provider before I take this medicine? They need to know if you have any of these conditions: -bone scan or tests planned -kidney disease -sickle cell anemia -an unusual or allergic reaction to tbo-filgrastim, filgrastim, pegfilgrastim, other medicines, foods, dyes, or preservatives -pregnant or trying to get pregnant -breast-feeding How should I use this medicine? This medicine is for injection under the skin. If you get this medicine at home, you will be taught how to prepare and give this medicine. Refer to the Instructions for Use that come with your medication packaging. Use exactly as directed. Take your medicine at regular intervals. Do not take your medicine more often than directed. It is important that you put your used needles and syringes in a special sharps container. Do not put them in a trash can. If you do not have a sharps container, call your pharmacist or healthcare provider to get one. Talk to your pediatrician regarding the use of this medicine in children. Special care may be needed. Overdosage: If you think you have taken too much of this medicine contact a poison control center or emergency room at once. NOTE: This medicine is only for you. Do not share this medicine with others. What if I miss a dose? It is important not to miss your dose. Call your doctor or health care professional if you miss a  dose. What may interact with this medicine? This medicine may interact with the following medications: -medicines that may cause a release of neutrophils, such as lithium This list may not describe all possible interactions. Give your health care provider a list of all the medicines, herbs, non-prescription drugs, or dietary supplements you use. Also tell them if you smoke, drink alcohol, or use illegal drugs. Some items may interact with your medicine. What should I watch for while using this medicine? You may need blood work done while you are taking this medicine. What side effects may I notice from receiving this medicine? Side effects that you should report to your doctor or health care professional as soon as possible: -allergic reactions like skin rash, itching or hives, swelling of the face, lips, or tongue -blood in the urine -dark urine -dizziness -fast heartbeat -feeling faint -shortness of breath or breathing problems -signs and symptoms of infection like fever or chills; cough; or sore throat -signs and symptoms of kidney injury like trouble passing urine or change in the amount of urine -stomach or side pain, or pain at the shoulder -sweating -swelling of the legs, ankles, or abdomen -tiredness Side effects that usually do not require medical attention (report to your doctor or health care professional if they continue or are bothersome): -bone pain -headache -muscle pain -vomiting This list may not describe all possible side effects. Call your doctor for medical advice about side effects. You may report side effects to FDA at 1-800-FDA-1088. Where should I keep my medicine? Keep out of the reach of children. Store in a refrigerator between   2 and 8 degrees C (36 and 46 degrees F). Keep in carton to protect from light. Throw away this medicine if it is left out of the refrigerator for more than 5 consecutive days. Throw away any unused medicine after the expiration  date. NOTE: This sheet is a summary. It may not cover all possible information. If you have questions about this medicine, talk to your doctor, pharmacist, or health care provider.  2018 Elsevier/Gold Standard (2015-06-27 19:07:04)  

## 2016-12-14 ENCOUNTER — Ambulatory Visit (HOSPITAL_BASED_OUTPATIENT_CLINIC_OR_DEPARTMENT_OTHER): Payer: Managed Care, Other (non HMO)

## 2016-12-14 VITALS — BP 117/53 | HR 90 | Temp 97.5°F | Resp 20

## 2016-12-14 DIAGNOSIS — Z5189 Encounter for other specified aftercare: Secondary | ICD-10-CM | POA: Diagnosis not present

## 2016-12-14 DIAGNOSIS — Z170 Estrogen receptor positive status [ER+]: Secondary | ICD-10-CM

## 2016-12-14 DIAGNOSIS — C50412 Malignant neoplasm of upper-outer quadrant of left female breast: Secondary | ICD-10-CM

## 2016-12-14 DIAGNOSIS — Z95828 Presence of other vascular implants and grafts: Secondary | ICD-10-CM

## 2016-12-14 MED ORDER — TBO-FILGRASTIM 300 MCG/0.5ML ~~LOC~~ SOSY
300.0000 ug | PREFILLED_SYRINGE | Freq: Once | SUBCUTANEOUS | Status: AC
  Administered 2016-12-14: 300 ug via SUBCUTANEOUS
  Filled 2016-12-14: qty 0.5

## 2016-12-14 NOTE — Progress Notes (Signed)
Location of Breast Cancer: left breast  Histology per Pathology Report:   07/26/16 Diagnosis 1. 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 Lymph node, sentinel, biopsy, Left Axilla #1 TWO BENIGN LYMPH NODES (0/2) 3. Lymph node, sentinel, biopsy, Left Axilla #2 BENIGN FIBROADIPOSE TISSUE  07/03/16 Diagnosis Breast, left, needle core biopsy, 1:00 o'clock - INVASIVE DUCTAL CARCINOMA WITH EXTRACELLULAR MUCIN, SEE COMMENT. - DUCTAL CARCINOMA IN SITU WITH CALCIFICATIONS.  Receptor Status: ER(5%), PR (0%), Her2-neu (positive), Ki-(50%)  Did patient present with symptoms (if so, please note symptoms) or was this found on screening mammography?: screening mammogram  Past/Anticipated interventions by surgeon, if any: 07/26/16 - Procedure: BREAST LUMPECTOMY WITH RADIOACTIVE SEED AND SENTINEL LYMPH NODE BIOPSY;  Surgeon: Faera Byerly, MD  Past/Anticipated interventions by medical oncology, if any: chemotherapy consisting of Paclitaxel weekly starting 09/18/2016, discontinued after one cycle due to peripheral neuropathy. Gemcitabine and Carboplatin started 10/02/2016, repeated days 1 and 8 of each 21 day cycle to a total of 8 doses (4 cycles). Trastuzumab started 08/07/2016, to continue for 12 months.  Lymphedema issues, if any:  no  Pain issues, if any:  no does have discomfort from her incision area on her left breast.  SAFETY ISSUES:  Prior radiation? no  Pacemaker/ICD? no  Possible current pregnancy?no  Is the patient on methotrexate? no  Current Complaints / other details:  Patient is here with her husband.  BP 105/61 (BP Location: Right Arm, Patient Position: Sitting)   Pulse 72   Temp 98.5 F (36.9 C) (Oral)   Ht 5' 6" (1.676 m)   Wt 156 lb 9.6 oz (71 kg)   SpO2 100%   BMI 25.28 kg/m    Wt Readings from Last 3 Encounters:  12/19/16 156  lb 9.6 oz (71 kg)  12/04/16 151 lb 12.8 oz (68.9 kg)  11/13/16 151 lb 11.2 oz (68.8 kg)      Hess, Karen R, RN 12/14/2016,8:35 AM   

## 2016-12-14 NOTE — Patient Instructions (Signed)
Tbo-Filgrastim injection What is this medicine? TBO-FILGRASTIM (T B O fil GRA stim) is a granulocyte colony-stimulating factor that stimulates the growth of neutrophils, a type of white blood cell important in the body's fight against infection. It is used to reduce the incidence of fever and infection in patients with certain types of cancer who are receiving chemotherapy that affects the bone marrow. This medicine may be used for other purposes; ask your health care provider or pharmacist if you have questions. COMMON BRAND NAME(S): Granix What should I tell my health care provider before I take this medicine? They need to know if you have any of these conditions: -bone scan or tests planned -kidney disease -sickle cell anemia -an unusual or allergic reaction to tbo-filgrastim, filgrastim, pegfilgrastim, other medicines, foods, dyes, or preservatives -pregnant or trying to get pregnant -breast-feeding How should I use this medicine? This medicine is for injection under the skin. If you get this medicine at home, you will be taught how to prepare and give this medicine. Refer to the Instructions for Use that come with your medication packaging. Use exactly as directed. Take your medicine at regular intervals. Do not take your medicine more often than directed. It is important that you put your used needles and syringes in a special sharps container. Do not put them in a trash can. If you do not have a sharps container, call your pharmacist or healthcare provider to get one. Talk to your pediatrician regarding the use of this medicine in children. Special care may be needed. Overdosage: If you think you have taken too much of this medicine contact a poison control center or emergency room at once. NOTE: This medicine is only for you. Do not share this medicine with others. What if I miss a dose? It is important not to miss your dose. Call your doctor or health care professional if you miss a  dose. What may interact with this medicine? This medicine may interact with the following medications: -medicines that may cause a release of neutrophils, such as lithium This list may not describe all possible interactions. Give your health care provider a list of all the medicines, herbs, non-prescription drugs, or dietary supplements you use. Also tell them if you smoke, drink alcohol, or use illegal drugs. Some items may interact with your medicine. What should I watch for while using this medicine? You may need blood work done while you are taking this medicine. What side effects may I notice from receiving this medicine? Side effects that you should report to your doctor or health care professional as soon as possible: -allergic reactions like skin rash, itching or hives, swelling of the face, lips, or tongue -blood in the urine -dark urine -dizziness -fast heartbeat -feeling faint -shortness of breath or breathing problems -signs and symptoms of infection like fever or chills; cough; or sore throat -signs and symptoms of kidney injury like trouble passing urine or change in the amount of urine -stomach or side pain, or pain at the shoulder -sweating -swelling of the legs, ankles, or abdomen -tiredness Side effects that usually do not require medical attention (report to your doctor or health care professional if they continue or are bothersome): -bone pain -headache -muscle pain -vomiting This list may not describe all possible side effects. Call your doctor for medical advice about side effects. You may report side effects to FDA at 1-800-FDA-1088. Where should I keep my medicine? Keep out of the reach of children. Store in a refrigerator between   2 and 8 degrees C (36 and 46 degrees F). Keep in carton to protect from light. Throw away this medicine if it is left out of the refrigerator for more than 5 consecutive days. Throw away any unused medicine after the expiration  date. NOTE: This sheet is a summary. It may not cover all possible information. If you have questions about this medicine, talk to your doctor, pharmacist, or health care provider.  2018 Elsevier/Gold Standard (2015-06-27 19:07:04)  

## 2016-12-18 ENCOUNTER — Ambulatory Visit (HOSPITAL_BASED_OUTPATIENT_CLINIC_OR_DEPARTMENT_OTHER): Payer: Managed Care, Other (non HMO)

## 2016-12-18 ENCOUNTER — Other Ambulatory Visit (HOSPITAL_BASED_OUTPATIENT_CLINIC_OR_DEPARTMENT_OTHER): Payer: Managed Care, Other (non HMO)

## 2016-12-18 VITALS — BP 117/59 | HR 80 | Temp 98.2°F | Resp 18

## 2016-12-18 DIAGNOSIS — Z5111 Encounter for antineoplastic chemotherapy: Secondary | ICD-10-CM

## 2016-12-18 DIAGNOSIS — Z5112 Encounter for antineoplastic immunotherapy: Secondary | ICD-10-CM

## 2016-12-18 DIAGNOSIS — C50412 Malignant neoplasm of upper-outer quadrant of left female breast: Secondary | ICD-10-CM | POA: Diagnosis not present

## 2016-12-18 DIAGNOSIS — Z170 Estrogen receptor positive status [ER+]: Principal | ICD-10-CM

## 2016-12-18 LAB — COMPREHENSIVE METABOLIC PANEL
ALBUMIN: 3.8 g/dL (ref 3.5–5.0)
ALK PHOS: 81 U/L (ref 40–150)
ALT: 34 U/L (ref 0–55)
ANION GAP: 9 meq/L (ref 3–11)
AST: 24 U/L (ref 5–34)
BUN: 13.8 mg/dL (ref 7.0–26.0)
CALCIUM: 9.4 mg/dL (ref 8.4–10.4)
CO2: 26 mEq/L (ref 22–29)
CREATININE: 0.8 mg/dL (ref 0.6–1.1)
Chloride: 107 mEq/L (ref 98–109)
EGFR: 79 mL/min/{1.73_m2} — ABNORMAL LOW (ref >90)
Glucose: 81 mg/dl (ref 70–140)
Potassium: 4 mEq/L (ref 3.5–5.1)
Sodium: 142 mEq/L (ref 136–145)
TOTAL PROTEIN: 6.6 g/dL (ref 6.4–8.3)

## 2016-12-18 LAB — CBC WITH DIFFERENTIAL/PLATELET
BASO%: 0.6 % (ref 0.0–2.0)
Basophils Absolute: 0 10*3/uL (ref 0.0–0.1)
EOS%: 2.7 % (ref 0.0–7.0)
Eosinophils Absolute: 0.2 10*3/uL (ref 0.0–0.5)
HEMATOCRIT: 30 % — AB (ref 34.8–46.6)
HGB: 10 g/dL — ABNORMAL LOW (ref 11.6–15.9)
LYMPH#: 1.8 10*3/uL (ref 0.9–3.3)
LYMPH%: 25.9 % (ref 14.0–49.7)
MCH: 33 pg (ref 25.1–34.0)
MCHC: 33.4 g/dL (ref 31.5–36.0)
MCV: 98.8 fL (ref 79.5–101.0)
MONO#: 0.8 10*3/uL (ref 0.1–0.9)
MONO%: 12.1 % (ref 0.0–14.0)
NEUT#: 4.1 10*3/uL (ref 1.5–6.5)
NEUT%: 58.7 % (ref 38.4–76.8)
PLATELETS: 169 10*3/uL (ref 145–400)
RBC: 3.03 10*6/uL — ABNORMAL LOW (ref 3.70–5.45)
RDW: 21.3 % — ABNORMAL HIGH (ref 11.2–14.5)
WBC: 7 10*3/uL (ref 3.9–10.3)

## 2016-12-18 MED ORDER — DIPHENHYDRAMINE HCL 25 MG PO CAPS
25.0000 mg | ORAL_CAPSULE | Freq: Once | ORAL | Status: AC
  Administered 2016-12-18: 25 mg via ORAL

## 2016-12-18 MED ORDER — SODIUM CHLORIDE 0.9 % IV SOLN
211.8000 mg | Freq: Once | INTRAVENOUS | Status: AC
  Administered 2016-12-18: 210 mg via INTRAVENOUS
  Filled 2016-12-18: qty 21

## 2016-12-18 MED ORDER — PALONOSETRON HCL INJECTION 0.25 MG/5ML
INTRAVENOUS | Status: AC
  Filled 2016-12-18: qty 5

## 2016-12-18 MED ORDER — SODIUM CHLORIDE 0.9 % IV SOLN: Freq: Once | INTRAVENOUS | Status: DC

## 2016-12-18 MED ORDER — PEGFILGRASTIM 6 MG/0.6ML ~~LOC~~ PSKT
6.0000 mg | PREFILLED_SYRINGE | Freq: Once | SUBCUTANEOUS | Status: AC
  Administered 2016-12-18: 6 mg via SUBCUTANEOUS
  Filled 2016-12-18: qty 0.6

## 2016-12-18 MED ORDER — GEMCITABINE HCL CHEMO INJECTION 1 GM/26.3ML
1000.0000 mg/m2 | Freq: Once | INTRAVENOUS | Status: AC
  Administered 2016-12-18: 1786 mg via INTRAVENOUS
  Filled 2016-12-18: qty 46.97

## 2016-12-18 MED ORDER — SODIUM CHLORIDE 0.9 % IV SOLN
Freq: Once | INTRAVENOUS | Status: AC
  Administered 2016-12-18: 09:00:00 via INTRAVENOUS

## 2016-12-18 MED ORDER — DEXAMETHASONE SODIUM PHOSPHATE 10 MG/ML IJ SOLN
INTRAMUSCULAR | Status: AC
  Filled 2016-12-18: qty 1

## 2016-12-18 MED ORDER — SODIUM CHLORIDE 0.9% FLUSH
10.0000 mL | INTRAVENOUS | Status: DC | PRN
  Administered 2016-12-18: 10 mL
  Filled 2016-12-18: qty 10

## 2016-12-18 MED ORDER — PALONOSETRON HCL INJECTION 0.25 MG/5ML
0.2500 mg | Freq: Once | INTRAVENOUS | Status: AC
  Administered 2016-12-18: 0.25 mg via INTRAVENOUS

## 2016-12-18 MED ORDER — ACETAMINOPHEN 325 MG PO TABS
ORAL_TABLET | ORAL | Status: AC
  Filled 2016-12-18: qty 2

## 2016-12-18 MED ORDER — TRASTUZUMAB CHEMO 150 MG IV SOLR
400.0000 mg | Freq: Once | INTRAVENOUS | Status: AC
  Administered 2016-12-18: 400 mg via INTRAVENOUS
  Filled 2016-12-18: qty 19.05

## 2016-12-18 MED ORDER — HEPARIN SOD (PORK) LOCK FLUSH 100 UNIT/ML IV SOLN
500.0000 [IU] | Freq: Once | INTRAVENOUS | Status: AC | PRN
  Administered 2016-12-18: 500 [IU]
  Filled 2016-12-18: qty 5

## 2016-12-18 MED ORDER — DIPHENHYDRAMINE HCL 25 MG PO CAPS
ORAL_CAPSULE | ORAL | Status: AC
  Filled 2016-12-18: qty 1

## 2016-12-18 MED ORDER — DEXAMETHASONE SODIUM PHOSPHATE 10 MG/ML IJ SOLN
10.0000 mg | Freq: Once | INTRAMUSCULAR | Status: AC
  Administered 2016-12-18: 10 mg via INTRAVENOUS

## 2016-12-18 MED ORDER — ACETAMINOPHEN 325 MG PO TABS
650.0000 mg | ORAL_TABLET | Freq: Once | ORAL | Status: AC
  Administered 2016-12-18: 650 mg via ORAL

## 2016-12-18 NOTE — Patient Instructions (Signed)
[facility] Discharge Instructions for Patients Receiving Chemotherapy  Today you received the following chemotherapy agents: Herceptin, Gemzar, and Carboplatin.  To help prevent nausea and vomiting after your treatment, we encourage you to take your nausea medication as directed If you develop nausea and vomiting that is not controlled by your nausea medication, call the clinic.   BELOW ARE SYMPTOMS THAT SHOULD BE REPORTED IMMEDIATELY:  *FEVER GREATER THAN 100.5 F  *CHILLS WITH OR WITHOUT FEVER  NAUSEA AND VOMITING THAT IS NOT CONTROLLED WITH YOUR NAUSEA MEDICATION  *UNUSUAL SHORTNESS OF BREATH  *UNUSUAL BRUISING OR BLEEDING  TENDERNESS IN MOUTH AND THROAT WITH OR WITHOUT PRESENCE OF ULCERS  *URINARY PROBLEMS  *BOWEL PROBLEMS  UNUSUAL RASH Items with * indicate a potential emergency and should be followed up as soon as possible.  Feel free to call the clinic you have any questions or concerns. The clinic phone number is [phone].  Please show the Industry at check-in to the Emergency Department and triage nurse.

## 2016-12-19 ENCOUNTER — Ambulatory Visit
Admission: RE | Admit: 2016-12-19 | Discharge: 2016-12-19 | Disposition: A | Discharge status: Home / Self Care | Payer: Managed Care, Other (non HMO) | Source: Ambulatory Visit | Attending: Radiation Oncology | Admitting: Radiation Oncology

## 2016-12-19 ENCOUNTER — Encounter: Payer: Self-pay | Admitting: Radiation Oncology

## 2016-12-19 VITALS — BP 105/61 | HR 72 | Temp 98.5°F | Ht 66.0 in | Wt 156.6 lb

## 2016-12-19 DIAGNOSIS — Z885 Allergy status to narcotic agent status: Secondary | ICD-10-CM | POA: Diagnosis not present

## 2016-12-19 DIAGNOSIS — C50412 Malignant neoplasm of upper-outer quadrant of left female breast: Secondary | ICD-10-CM

## 2016-12-19 DIAGNOSIS — Z882 Allergy status to sulfonamides status: Secondary | ICD-10-CM | POA: Diagnosis not present

## 2016-12-19 DIAGNOSIS — Z8041 Family history of malignant neoplasm of ovary: Secondary | ICD-10-CM | POA: Diagnosis not present

## 2016-12-19 DIAGNOSIS — Z9109 Other allergy status, other than to drugs and biological substances: Secondary | ICD-10-CM | POA: Insufficient documentation

## 2016-12-19 DIAGNOSIS — Z8262 Family history of osteoporosis: Secondary | ICD-10-CM | POA: Insufficient documentation

## 2016-12-19 DIAGNOSIS — Z800 Family history of malignant neoplasm of digestive organs: Secondary | ICD-10-CM | POA: Diagnosis not present

## 2016-12-19 DIAGNOSIS — Z823 Family history of stroke: Secondary | ICD-10-CM | POA: Insufficient documentation

## 2016-12-19 DIAGNOSIS — M1990 Unspecified osteoarthritis, unspecified site: Secondary | ICD-10-CM | POA: Insufficient documentation

## 2016-12-19 DIAGNOSIS — Z8249 Family history of ischemic heart disease and other diseases of the circulatory system: Secondary | ICD-10-CM | POA: Insufficient documentation

## 2016-12-19 DIAGNOSIS — Z803 Family history of malignant neoplasm of breast: Secondary | ICD-10-CM | POA: Insufficient documentation

## 2016-12-19 DIAGNOSIS — Z9889 Other specified postprocedural states: Secondary | ICD-10-CM | POA: Diagnosis not present

## 2016-12-19 DIAGNOSIS — Z170 Estrogen receptor positive status [ER+]: Secondary | ICD-10-CM | POA: Diagnosis present

## 2016-12-19 DIAGNOSIS — Z510 Encounter for antineoplastic radiation therapy: Secondary | ICD-10-CM | POA: Insufficient documentation

## 2016-12-19 NOTE — Progress Notes (Signed)
Please see the Nurse Progress Note in the MD Initial Consult Encounter for this patient. 

## 2016-12-19 NOTE — Progress Notes (Signed)
Radiation Oncology         [phone]) [phone] ________________________________  Re-evaluation note  Name: Jamie [name] MRN: [id number]  Date: 12/19/2016  DOB: [birth year]  RR:[id number], Lanelle Bal, DO  [name], MD   REFERRING PHYSICIAN: [name], MD  DIAGNOSIS:   61 y.o. 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  HISTORY OF PRESENT ILLNESS::Jamie [name] is a 61 y.o. female who had a screening mammogram on 06/22/2016 showing a possible mass in the left breast. Diagnostic mammogram of the left breast on 07/02/2016 showed a partially obscured mass in the upper outer quadrant posterior depth. Physical exam at the time revealed no palpable suspicious masses, but there was slight thickening in the 12-1:00 position middle to posterior depth. Ultrasound at the time showed a 0.7 x 0.8 x 0.8 cm mass in the 1:00 position 4 cm from the nipple. Ultrasound of the left axilla revealed no lymphadenopathy. Biopsy of the left breast on 07/03/2016 revealed grade 2-3 invasive ductal carcinoma with extracellular mucin and DCIS with calcifications (ER 5% positive, PR 0% negative, HER2 positive, Ki67 50%).  The patient proceeded with left breast lumpectomy and sentinel lymph node biopsy on 07/26/2016 along with right port-a-cath insertion. Surgical pathology revealed invasive ductal carcinoma with extracellular mucin, Grade 3. All margins of resection were negative for carcinoma, as well as two left axillary lymph nodes.  The patient has begun chemotherapy with medical oncology as of 09/18/2016, which consisted of Paclitaxel (discontinued after one cycle due to peripheral neuropathy). She then started Gemcitabine and Carboplatin on 10/02/2016 and repeated days 1 and 8 of each 21 day cycle to a total of 8 does (4 cycles). Trastuzamab was started 08/07/2016 and will continue for 12 months.  The patient presents today to discuss  potential radiation treatment options, accompanied by her husband. She reports she does have some discomfort from the incision area on her left breast, but otherwise denies any pain.  PREVIOUS RADIATION THERAPY: No  PAST MEDICAL HISTORY:  has a past medical history of Anxiety; Arthritis; Basal cell carcinoma; Breast cancer ([place]) (06/2016); Eczema; Genetic testing (08/15/2016); and PONV (postoperative nausea and vomiting).    PAST SURGICAL HISTORY: Past Surgical History:  Procedure Laterality Date  . BONE SPUR  2001 AND 1988  . BREAST LUMPECTOMY WITH RADIOACTIVE SEED AND SENTINEL LYMPH NODE BIOPSY Left 07/26/2016   Procedure: BREAST LUMPECTOMY WITH RADIOACTIVE SEED AND SENTINEL LYMPH NODE BIOPSY;  Surgeon: [name], MD;  Location: [facility];  Service: General;  Laterality: Left;  . BUNIONECTOMY  10/13  . COLONOSCOPY W/ POLYPECTOMY  08/2007  . DILATION AND CURETTAGE OF UTERUS  2002  . MOHS SURGERY  2010  . PORTACATH PLACEMENT Right 07/26/2016   Procedure: INSERTION PORT-A-CATH;  Surgeon: [name], MD;  Location: [facility];  Service: General;  Laterality: Right;    FAMILY HISTORY: family history includes Breast cancer (age of onset: 28) in her maternal aunt; Heart disease in her father and paternal grandmother; Hypertension in her father; Liver cancer (age of onset: 8) in her maternal uncle; Osteoporosis in her sister; Ovarian cancer (age of onset: 66) in her mother; Stroke in her father.  SOCIAL HISTORY:  reports that she has never smoked. She has never used smokeless tobacco. She reports that she drinks alcohol. She reports that she does not use drugs.  ALLERGIES: Codeine; Sulfamethoxazole; and Adhesive [tape]  MEDICATIONS:  Current Outpatient Prescriptions  Medication Sig Dispense Refill  . CALCIUM PO Take 500 mg by mouth 2 (two) times daily. Skeletal strength 343m Calium    . cetirizine (ZYRTEC) 10 MG tablet Take 10 mg by mouth daily.    .  Cholecalciferol (VITAMIN D3) 1000 units CAPS Take 1 capsule by mouth daily.    . Cyanocobalamin (B-12) 2500 MCG TABS Take by mouth.    . dexamethasone (DECADRON) 4 MG tablet Take 2 tablets (8 mg total) by mouth daily. Start the day after chemotherapy for 2 days. Take with food. 30 tablet 1  . diphenhydrAMINE (BENADRYL) 25 mg capsule Take 25 mg by mouth every 6 (six) hours as needed.    . Lactobacillus (ACIDOPHILUS PROBIOTIC PO) Take by mouth daily.    .Marland Kitchenlidocaine-prilocaine (EMLA) cream Apply one application to port 1-2 hours prior to access. Cover with saran wrap. 30 g 3  . LORazepam (ATIVAN) 0.5 MG tablet Take 1 tablet (0.5 mg total) by mouth at bedtime as needed (Nausea or vomiting). 30 tablet 0  . Multiple Vitamin (MULTIVITAMIN) tablet Take 1 tablet by mouth daily.    . cholestyramine (QUESTRAN) 4 g packet Take 1 packet (4 g total) by mouth 2 (two) times daily. Until diarrhea resolves (Patient not taking: Reported on 12/19/2016) 14 each 0  . Melatonin 3 MG TABS Take 6 mg by mouth as needed.    . prochlorperazine (COMPAZINE) 10 MG tablet Take 10 mg by mouth every 6 (six) hours as needed.    . SYMBICORT 160-4.5 MCG/ACT inhaler INL 2 PFS ITL BID  1  . venlafaxine XR (EFFEXOR-XR) 75 MG 24 hr capsule Take 1 capsule (75 mg total) by mouth daily with breakfast. (Patient not taking: Reported on 12/19/2016) 30 capsule 6   No current facility-administered medications for this encounter.     REVIEW OF SYSTEMS:  REVIEW OF SYSTEMS: A 10+ POINT REVIEW OF SYSTEMS WAS OBTAINED including neurology, dermatology, psychiatry, cardiac, respiratory, lymph, extremities, GI, GU, musculoskeletal, constitutional, reproductive, HEENT. All pertinent positives are noted in the HPI. All others are negative.   PHYSICAL EXAM:  height is _0  (1.676 m) and weight is 156 lb 9.6 oz (71 kg). Her oral temperature is 98.5 F (36.9 C). Her blood pressure is 105/61 and her pulse is 72. Her oxygen saturation is 100%.   General: Alert  and oriented, in no acute distress Neck: Neck is supple, no palpable cervical or supraclavicular lymphadenopathy. Heart: Regular in rate and rhythm with no murmurs, rubs, or gallops. Chest: Clear to auscultation bilaterally, with no rhonchi, wheezes, or rales. Right Breast: No palpable mass or nipple discharge. Left Breast: Scar on UOQ that has healed well. There is a separate scar in the axillary region, also healed well. No palpable mass, nipple discharge, or bleeding. The patient has chronically inverted nipples.  ECOG = 1   LABORATORY DATA:  Lab Results  Component Value Date   WBC 7.0 12/18/2016   HGB 10.0 (L) 12/18/2016   HCT 30.0 (L) 12/18/2016   MCV 98.8 12/18/2016   PLT 169 12/18/2016   NEUTROABS 4.1 12/18/2016   Lab Results  Component Value Date   NA 142 12/18/2016   K 4.0 12/18/2016   CL 100 08/04/2015   CO2 26 12/18/2016   GLUCOSE 81 12/18/2016   CREATININE 0.8 12/18/2016   CALCIUM 9.4 12/18/2016      RADIOGRAPHY: No results found.    IMPRESSION: 61y.o. woman with stage IA"(pT1bN0) grade 3 invasive ductal carcinoma of the left breast (  ER weakly positive, PR negative, HER2 positive). She has completed her initial adjuvant chemotherapy but will proceed with additional Herceptin.  The patient will be a good candidate for breast conservation therapy with radiation therapy directed at the left breast. The patient would appear to be a good candidate for hypofractionated accelerated radiation therapy. We discussed the risks, benefits, short, and long term effects of radiotherapy, and the patient is interested in proceeding. We discussed the delivery and logistics of radiotherapy. She agrees and wishes to proceed with treatment.  PLAN: She will be scheduled for simulation next week with treatments to begin approximately 2-3 weeks after her last cycle of chemotherapy. Anticipate 4 weeks of radiation therapy.     ------------------------------------------------  Blair Promise, PhD, MD  This document serves as a record of services personally performed by [name], MD. It was created on his behalf by Rae Lips, a trained medical scribe. The creation of this record is based on the scribe's personal observations and the provider's statements to them. This document has been checked and approved by the attending provider.

## 2016-12-20 ENCOUNTER — Telehealth: Payer: Self-pay | Admitting: Oncology

## 2016-12-20 NOTE — Telephone Encounter (Signed)
Patient called and asked when her [place] will be next week.  Advised her that we will call her back with the appointment time.

## 2016-12-22 ENCOUNTER — Emergency Department (HOSPITAL_COMMUNITY)
Admission: EM | Admit: 2016-12-22 | Discharge: 2016-12-23 | Disposition: A | Discharge status: Home / Self Care | Payer: Managed Care, Other (non HMO) | Attending: Emergency Medicine | Admitting: Emergency Medicine

## 2016-12-22 ENCOUNTER — Emergency Department (HOSPITAL_COMMUNITY): Payer: Managed Care, Other (non HMO)

## 2016-12-22 ENCOUNTER — Other Ambulatory Visit: Payer: Self-pay

## 2016-12-22 ENCOUNTER — Encounter (HOSPITAL_COMMUNITY): Payer: Self-pay

## 2016-12-22 DIAGNOSIS — H539 Unspecified visual disturbance: Secondary | ICD-10-CM | POA: Insufficient documentation

## 2016-12-22 DIAGNOSIS — H538 Other visual disturbances: Secondary | ICD-10-CM | POA: Diagnosis present

## 2016-12-22 DIAGNOSIS — E039 Hypothyroidism, unspecified: Secondary | ICD-10-CM | POA: Diagnosis not present

## 2016-12-22 DIAGNOSIS — G44219 Episodic tension-type headache, not intractable: Secondary | ICD-10-CM

## 2016-12-22 DIAGNOSIS — R51 Headache: Secondary | ICD-10-CM | POA: Diagnosis not present

## 2016-12-22 DIAGNOSIS — Z79899 Other long term (current) drug therapy: Secondary | ICD-10-CM | POA: Diagnosis not present

## 2016-12-22 DIAGNOSIS — Z853 Personal history of malignant neoplasm of breast: Secondary | ICD-10-CM | POA: Diagnosis not present

## 2016-12-22 LAB — URINALYSIS, ROUTINE W REFLEX MICROSCOPIC
BILIRUBIN URINE: NEGATIVE
GLUCOSE, UA: NEGATIVE mg/dL
Hgb urine dipstick: NEGATIVE
KETONES UR: NEGATIVE mg/dL
LEUKOCYTES UA: NEGATIVE
NITRITE: NEGATIVE
PH: 7 (ref 5.0–8.0)
Protein, ur: NEGATIVE mg/dL
SPECIFIC GRAVITY, URINE: 1.005 (ref 1.005–1.030)

## 2016-12-22 LAB — PROTIME-INR
INR: 0.94
Prothrombin Time: 12.5 seconds (ref 11.4–15.2)

## 2016-12-22 LAB — COMPREHENSIVE METABOLIC PANEL
ALBUMIN: 4.2 g/dL (ref 3.5–5.0)
ALT: 29 U/L (ref 14–54)
AST: 23 U/L (ref 15–41)
Alkaline Phosphatase: 123 U/L (ref 38–126)
Anion gap: 7 (ref 5–15)
BILIRUBIN TOTAL: 0.2 mg/dL — AB (ref 0.3–1.2)
BUN: 15 mg/dL (ref 6–20)
CHLORIDE: 106 mmol/L (ref 101–111)
CO2: 29 mmol/L (ref 22–32)
CREATININE: 0.73 mg/dL (ref 0.44–1.00)
Calcium: 9.3 mg/dL (ref 8.9–10.3)
GFR calc Af Amer: >60 mL/min (ref >60)
GLUCOSE: 98 mg/dL (ref 65–99)
POTASSIUM: 3.8 mmol/L (ref 3.5–5.1)
Sodium: 142 mmol/L (ref 135–145)
Total Protein: 6.7 g/dL (ref 6.5–8.1)

## 2016-12-22 LAB — APTT: APTT: 26 s (ref 24–36)

## 2016-12-22 LAB — CBC
HEMATOCRIT: 28.3 % — AB (ref 36.0–46.0)
Hemoglobin: 9.5 g/dL — ABNORMAL LOW (ref 12.0–15.0)
MCH: 33.5 pg (ref 26.0–34.0)
MCHC: 33.6 g/dL (ref 30.0–36.0)
MCV: 99.6 fL (ref 78.0–100.0)
PLATELETS: 297 10*3/uL (ref 150–400)
RBC: 2.84 MIL/uL — AB (ref 3.87–5.11)
RDW: 18.6 % — AB (ref 11.5–15.5)
WBC: 33.4 10*3/uL — AB (ref 4.0–10.5)

## 2016-12-22 LAB — I-STAT TROPONIN, ED: TROPONIN I, POC: 0 ng/mL (ref 0.00–0.08)

## 2016-12-22 MED ORDER — LIDOCAINE-EPINEPHRINE-TETRACAINE (LET) SOLUTION: 3.0000 mL | Freq: Once | NASAL | Status: DC

## 2016-12-22 MED ORDER — FENTANYL CITRATE (PF) 100 MCG/2ML IJ SOLN: 50.0000 ug | Freq: Once | INTRAMUSCULAR | Status: DC

## 2016-12-22 NOTE — ED Triage Notes (Signed)
Pt under current chemo treatment.  Pt woke up today with visual changes.  Circles around her eyes.  Headache.

## 2016-12-22 NOTE — Discharge Instructions (Signed)
As discussed, make sure they stay well-hydrated and follow-up with your oncologist as soon as possible.  Return to the emergency department if experiencing any visual disturbances, weakness, bad headache, dizziness, nausea, vomiting or other new concerning symptoms in the meantime.

## 2016-12-22 NOTE — ED Notes (Signed)
Visual Acuity done with contacts in place. Pt reports last prescription change in October 2017

## 2016-12-23 NOTE — ED Provider Notes (Addendum)
[facility] DEPT Provider Note   CSN: [id number] Arrival date & time: 12/22/16  1809     History   Chief Complaint Chief Complaint  Patient presents with  . Eye Problem    HPI Jamie [name] is a 61 y.o. female with a history of breast malignancy currently on chemotherapy with last treatment on Tuesday presenting with visual disturbances that happened twice today in which she describes seeing a circular spot with blurriness in the middle and multiple moving lines surrounding it. This has been visible in her right visual field and from both eyes. She also reports a tension type headache accompanying this. She is not experiencing visual disturbances at this time but is still having a mild headache. She has taken Tylenol with relief of this headache prior to arrival and that is starting to come back. She denies any eye pain or blurry vision otherwise. She explains that she is generally feeling unwell due to chemotherapy on Tuesday and typically the Saturday following treatment is the worst day for her. She has not been experiencing any new symptoms other than this visual disturbance. No history of migraine with aura in the past. No fever, chills, nausea, vomiting, shortness of breath, or other symptoms.  HPI  Past Medical History:  Diagnosis Date  . Anxiety   . Arthritis    feet  . Basal cell carcinoma   . Breast cancer ([place]) 06/2016   left breast  . Eczema   . Genetic testing 08/15/2016   Ms. Liguori underwent genetic testing for hereditary cancer syndrome through [employer]'s 43-gene Common Hereditary Cancers Panel. Ms. 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 Result report is dated 08/15/2016. Please see genetic counseling documentation from 08/17/2016 for further discussion.  [name] PONV (postoperative nausea and vomiting)     Patient Active Problem List   Diagnosis Date Noted  . Diarrhea 11/05/2016  . Port  catheter in place 10/23/2016  . Genetic testing 08/15/2016  . Malignant neoplasm of upper-outer quadrant of left breast in female, estrogen receptor positive ([place]) 07/10/2016  . Allergic rhinitis 06/13/2016  . History of basal cell carcinoma 06/13/2016  . Eczema 06/13/2016  . Hyperlipemia 06/13/2016  . History of colon polyps 06/13/2016  . 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 RLS (restless legs syndrome) 06/13/2016  . Hypothyroidism 07/20/2013  . Bunion 01/21/2013  . Osteopenia 01/20/2013    Past Surgical History:  Procedure Laterality Date  . BONE SPUR  2001 AND 1988  . BREAST LUMPECTOMY WITH RADIOACTIVE SEED AND SENTINEL LYMPH NODE BIOPSY Left 07/26/2016   Procedure: BREAST LUMPECTOMY WITH RADIOACTIVE SEED AND SENTINEL LYMPH NODE BIOPSY;  Surgeon: [name], MD;  Location: [facility];  Service: General;  Laterality: Left;  . BUNIONECTOMY  10/13  . COLONOSCOPY W/ POLYPECTOMY  08/2007  . DILATION AND CURETTAGE OF UTERUS  2002  . MOHS SURGERY  2010  . PORTACATH PLACEMENT Right 07/26/2016   Procedure: INSERTION PORT-A-CATH;  Surgeon: [name], MD;  Location: [facility];  Service: General;  Laterality: Right;    OB History    No data available       Home Medications    Prior to Admission medications   Medication Sig Start Date End Date Taking? Authorizing Provider  CALCIUM PO Take 500 mg by mouth 2 (two) times daily. Skeletal strength 300mg  Calium    [provider]  cetirizine (ZYRTEC) 10 MG tablet Take 10 mg  by mouth daily.    [provider]  Cholecalciferol (VITAMIN D3) 1000 units CAPS Take 1 capsule by mouth daily.    [provider]  cholestyramine (QUESTRAN) 4 g packet Take 1 packet (4 g total) by mouth 2 (two) times daily. Until diarrhea resolves Patient not taking: Reported on 12/19/2016 11/05/16   [name], [name], MD  Cyanocobalamin (B-12) 2500 MCG TABS Take by mouth.    [provider]    diphenhydrAMINE (BENADRYL) 25 mg capsule Take 25 mg by mouth every 6 (six) hours as needed.    [provider]  Lactobacillus (ACIDOPHILUS PROBIOTIC PO) Take by mouth daily.    [provider]  lidocaine-prilocaine (EMLA) cream Apply one application to port 1-2 hours prior to access. Cover with saran wrap. 09/25/16   Gardenia Phlegm, NP  Melatonin 3 MG TABS Take 6 mg by mouth as needed.    [provider]  Multiple Vitamin (MULTIVITAMIN) tablet Take 1 tablet by mouth daily.    [provider]  prochlorperazine (COMPAZINE) 10 MG tablet Take 10 mg by mouth every 6 (six) hours as needed.    [provider]  SYMBICORT 160-4.5 MCG/ACT inhaler INL 2 PFS ITL BID 06/13/16   [provider]  venlafaxine XR (EFFEXOR-XR) 75 MG 24 hr capsule Take 1 capsule (75 mg total) by mouth daily with breakfast. Patient not taking: Reported on 12/19/2016 12/04/16   [name], [name], MD    Family History Family History  Problem Relation Age of Onset  . Ovarian cancer Mother 38  . Hypertension Father   . Stroke Father   . Heart disease Father   . Breast cancer Maternal Aunt 77       recurred at 92  . Heart disease Paternal Grandmother   . Osteoporosis Sister   . Liver cancer Maternal Uncle 77    Social History Social History  Substance Use Topics  . Smoking status: Never Smoker  . Smokeless tobacco: Never Used  . Alcohol use 0.0 oz/week     Comment: 1-2     Allergies   Codeine; Sulfamethoxazole; and Adhesive [tape]   Review of Systems Review of Systems  Constitutional: Positive for fatigue. Negative for chills and fever.  Eyes: Positive for visual disturbance. Negative for photophobia, pain and redness.  Respiratory: Negative for cough, shortness of breath, wheezing and stridor.   Cardiovascular: Negative for chest pain and palpitations.  Gastrointestinal: Negative for abdominal distention and abdominal pain.  Musculoskeletal: Negative  for gait problem, neck pain and neck stiffness.  Skin: Negative for color change and pallor.  Neurological: Positive for headaches. Negative for dizziness, tremors, seizures, syncope, facial asymmetry, speech difficulty, weakness, light-headedness and numbness.     Physical Exam Updated Vital Signs BP 138/81 (BP Location: Right Arm)   Pulse 72   Temp 98.4 F (36.9 C) (Oral)   Resp 18   Ht 5\' 6"  (1.676 m)   Wt 68 kg (150 lb)   SpO2 100%   BMI 24.21 kg/m   Physical Exam  Constitutional: She is oriented to person, place, and time. She appears well-developed and well-nourished. No distress.  Afebrile, nontoxic-appearing, sitting comfortably in bed in no acute distress.  HENT:  Head: Normocephalic and atraumatic.  Right Ear: External ear normal.  Left Ear: External ear normal.  Nose: Nose normal.  Mouth/Throat: Oropharynx is clear and moist. No oropharyngeal exudate.  Eyes: Pupils are equal, round, and reactive to light. Conjunctivae and EOM are normal. Right eye  exhibits no discharge. Left eye exhibits no discharge.  Neck: Normal range of motion. Neck supple.  Cardiovascular: Normal rate, regular rhythm, normal heart sounds and intact distal pulses.   No murmur heard. Pulmonary/Chest: Effort normal and breath sounds normal. No respiratory distress. She has no wheezes. She has no rales.  Abdominal: She exhibits no distension.  Musculoskeletal: Normal range of motion. She exhibits no edema, tenderness or deformity.  Neurological: She is alert and oriented to person, place, and time. No cranial nerve deficit or sensory deficit. She exhibits normal muscle tone. Coordination normal.  Neurologic Exam:  - Mental status: Patient is alert and cooperative. Fluent speech and words are clear. Coherent thought processes and insight is good. Patient is oriented x 4 to person, place, time and event.  - Cranial nerves:  CN III, IV, VI: pupils equally round, reactive to light both direct and  conscensual. Full extra-ocular movement. CN V: motor temporalis and masseter strength intact. CN VII : muscles of facial expression intact. CN X :  midline uvula. XI strength of sternocleidomastoid and trapezius muscles 5/5, XII: tongue is midline when protruded. - Motor: No involuntary movements. Muscle tone and bulk normal throughout. Muscle strength is 5/5 in bilateral shoulder abduction, elbow flexion and extension, grip, hip extension, flexion, leg flexion and extension, ankle dorsiflexion and plantar flexion.  - Sensory: Proprioception, light tough sensation intact in all extremities.  - Cerebellar: rapid alternating movements and point to point movement intact in upper and lower extremities. Normal stance and gait. Negative pronator drift, negative romberg.   Skin: Skin is warm and dry. No rash noted. She is not diaphoretic. No erythema. No pallor.  Psychiatric: She has a normal mood and affect.  Nursing note and vitals reviewed.    Visual Acuity  Right Eye Distance: 20/40 Left Eye Distance: 20/30 Bilateral Distance: 20/30   ED Treatments / Results  Labs (all labs ordered are listed, but only abnormal results are displayed) Labs Reviewed  CBC - Abnormal; Notable for the following:       Result Value   WBC 33.4 (*)    RBC 2.84 (*)    Hemoglobin 9.5 (*)    HCT 28.3 (*)    RDW 18.6 (*)    All other components within normal limits  COMPREHENSIVE METABOLIC PANEL - Abnormal; Notable for the following:    Total Bilirubin 0.2 (*)    All other components within normal limits  URINALYSIS, ROUTINE W REFLEX MICROSCOPIC - Abnormal; Notable for the following:    Color, Urine COLORLESS (*)    All other components within normal limits  PROTIME-INR  APTT  DIFFERENTIAL  I-STAT TROPONIN, ED    EKG  EKG Interpretation None     Sinus rhythm, normal P axis RSR in V1 or v2, right VCD or RVH HR 70 QTC 434  Radiology No results found.  Procedures Procedures (including critical  care time)  Medications Ordered in ED Medications - No data to display   Initial Impression / Assessment and Plan / ED Course  I have reviewed the triage vital signs and the nursing notes.  Pertinent labs & imaging results that were available during my care of the patient were reviewed by me and considered in my medical decision making (see chart for details).  She presented with visual disturbances. Given patient's history, I am concern for metastasis and space-occupying lesion as possible etiology of patient's symptoms. Patient had normal neuro exam, no focal deficit.    MRI is unavailable at  this location and patient would need to go to [facility]. Consult neurologist regarding optimal imaging study.  Spoke to neurologist on call who recommended contrast MRI tonight and transferred to [facility] to have this completed. His suspicion was that this may be due to a migraine aura rather than TIA and anticipate discharge home if MRI negative.  Called ED provider Dr. Billy Fischer at [facility] who accepted patient for transfer. Charge nurse is reporting that the MRI cannot be performed until 2 in the morning.  Patient refuses to wait for MRI and wants to go home and consult with her oncologist prior to any further imaging. She has not been experiencing any symptoms since she has been in the emergency department. Patient has concerns regarding unnecessary radiation and since she is not symptomatic at this time no CT head was ordered.  Patient also with an elevated white count at 33 but no identifiable source. Patient is given Neulasta with chemotherapy treatment and reports that her white count typically is high following treatment due to this.  Patient wanted to take her own Tylenol for headache and has improved while in the emergency department.  Patient left AMA. She understood the risks associated with leaving including possible death. She clearly understands reasons for return and will return if she  experiences any visual disturbances again. I attempted to convince her to stay to the best of my ability but the patient refused and wanted to go home.  Discussed strict return precautions and advised to return to the emergency department if experiencing any new or worsening symptoms. Instructions were understood and patient agreed with discharge plan. Final Clinical Impressions(s) / ED Diagnoses   Final diagnoses:  Visual disturbances  Episodic tension-type headache, not intractable    New Prescriptions Discharge Medication List as of 12/22/2016 11:48 PM       Emeline General, PA-C 12/23/16 0303    Tegeler, Gwenyth Allegra, MD 12/23/16 1108    Avie Echevaria B, PA-C 01/02/17 0150    Tegeler, Gwenyth Allegra, MD 01/02/17 1244

## 2016-12-24 ENCOUNTER — Telehealth: Payer: Self-pay

## 2016-12-24 NOTE — Telephone Encounter (Signed)
F/U call to pt regarding her visit to the Ed on 12/22/16. Pt states she has not had a headache or any blurred vision since yesterday, 12/23/16, although she does still have some numbness in her hands and feet.  Pt advised to discuss this with Dr [name] at her appt tomorrow.  Pt verbalizes understanding and no other concerns at this time.

## 2016-12-25 ENCOUNTER — Other Ambulatory Visit (HOSPITAL_BASED_OUTPATIENT_CLINIC_OR_DEPARTMENT_OTHER): Payer: Managed Care, Other (non HMO)

## 2016-12-25 ENCOUNTER — Ambulatory Visit
Admission: RE | Admit: 2016-12-25 | Discharge: 2016-12-25 | Disposition: A | Discharge status: Home / Self Care | Payer: Managed Care, Other (non HMO) | Source: Ambulatory Visit | Attending: Radiation Oncology | Admitting: Radiation Oncology

## 2016-12-25 ENCOUNTER — Ambulatory Visit (HOSPITAL_BASED_OUTPATIENT_CLINIC_OR_DEPARTMENT_OTHER): Payer: Managed Care, Other (non HMO)

## 2016-12-25 ENCOUNTER — Ambulatory Visit: Payer: Managed Care, Other (non HMO)

## 2016-12-25 ENCOUNTER — Other Ambulatory Visit: Payer: Managed Care, Other (non HMO)

## 2016-12-25 ENCOUNTER — Ambulatory Visit: Payer: Managed Care, Other (non HMO) | Admitting: Oncology

## 2016-12-25 ENCOUNTER — Ambulatory Visit (HOSPITAL_BASED_OUTPATIENT_CLINIC_OR_DEPARTMENT_OTHER): Payer: Managed Care, Other (non HMO) | Admitting: Oncology

## 2016-12-25 VITALS — BP 118/59 | HR 83 | Temp 98.4°F | Resp 18 | Ht 66.0 in | Wt 154.1 lb

## 2016-12-25 DIAGNOSIS — C50412 Malignant neoplasm of upper-outer quadrant of left female breast: Secondary | ICD-10-CM | POA: Diagnosis not present

## 2016-12-25 DIAGNOSIS — Z95828 Presence of other vascular implants and grafts: Secondary | ICD-10-CM

## 2016-12-25 DIAGNOSIS — Z170 Estrogen receptor positive status [ER+]: Principal | ICD-10-CM

## 2016-12-25 DIAGNOSIS — Z510 Encounter for antineoplastic radiation therapy: Secondary | ICD-10-CM | POA: Diagnosis not present

## 2016-12-25 LAB — COMPREHENSIVE METABOLIC PANEL
ALK PHOS: 140 U/L (ref 40–150)
ALT: 29 U/L (ref 0–55)
ANION GAP: 7 meq/L (ref 3–11)
AST: 23 U/L (ref 5–34)
Albumin: 3.8 g/dL (ref 3.5–5.0)
BUN: 13.3 mg/dL (ref 7.0–26.0)
CALCIUM: 9.6 mg/dL (ref 8.4–10.4)
CHLORIDE: 106 meq/L (ref 98–109)
CO2: 27 meq/L (ref 22–29)
Creatinine: 0.7 mg/dL (ref 0.6–1.1)
EGFR: >90 mL/min/{1.73_m2} (ref >90)
GLUCOSE: 103 mg/dL (ref 70–140)
POTASSIUM: 4.2 meq/L (ref 3.5–5.1)
Sodium: 140 mEq/L (ref 136–145)
Total Bilirubin: 0.26 mg/dL (ref 0.20–1.20)
Total Protein: 6.7 g/dL (ref 6.4–8.3)

## 2016-12-25 LAB — CBC WITH DIFFERENTIAL/PLATELET
BASO%: 0.3 % (ref 0.0–2.0)
BASOS ABS: 0 10*3/uL (ref 0.0–0.1)
EOS%: 0.5 % (ref 0.0–7.0)
Eosinophils Absolute: 0.1 10*3/uL (ref 0.0–0.5)
HEMATOCRIT: 28.5 % — AB (ref 34.8–46.6)
HGB: 9.6 g/dL — ABNORMAL LOW (ref 11.6–15.9)
LYMPH#: 2 10*3/uL (ref 0.9–3.3)
LYMPH%: 15.1 % (ref 14.0–49.7)
MCH: 32.9 pg (ref 25.1–34.0)
MCHC: 33.8 g/dL (ref 31.5–36.0)
MCV: 97.2 fL (ref 79.5–101.0)
MONO#: 0.6 10*3/uL (ref 0.1–0.9)
MONO%: 4.3 % (ref 0.0–14.0)
NEUT#: 10.4 10*3/uL — ABNORMAL HIGH (ref 1.5–6.5)
NEUT%: 79.8 % — AB (ref 38.4–76.8)
PLATELETS: 252 10*3/uL (ref 145–400)
RBC: 2.93 10*6/uL — AB (ref 3.70–5.45)
RDW: 20 % — ABNORMAL HIGH (ref 11.2–14.5)
WBC: 13.1 10*3/uL — ABNORMAL HIGH (ref 3.9–10.3)

## 2016-12-25 MED ORDER — SODIUM CHLORIDE 0.9% FLUSH
10.0000 mL | Freq: Once | INTRAVENOUS | Status: AC
  Administered 2016-12-25: 10 mL
  Filled 2016-12-25: qty 10

## 2016-12-25 MED ORDER — HEPARIN SOD (PORK) LOCK FLUSH 100 UNIT/ML IV SOLN
500.0000 [IU] | Freq: Once | INTRAVENOUS | Status: AC
  Administered 2016-12-25: 500 [IU]
  Filled 2016-12-25: qty 5

## 2016-12-25 NOTE — Progress Notes (Signed)
  Radiation Oncology         [phone] ________________________________  Name: Jamie [name] MRN: [id number]  Date: 12/25/2016  DOB: 05-29-1955  SIMULATION AND TREATMENT PLANNING NOTE    ICD-10-CM   1. Malignant neoplasm of upper-outer quadrant of left breast in female, estrogen receptor positive ([place]) C50.412    Z17.0     DIAGNOSIS:  61 y.o. 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  NARRATIVE:  The patient was brought to the [facility].  Identity was confirmed.  All relevant records and images related to the planned course of therapy were reviewed.  The patient freely provided informed written consent to proceed with treatment after reviewing the details related to the planned course of therapy. The consent form was witnessed and verified by the simulation staff.  Then, the patient was set-up in a stable reproducible  supine position for radiation therapy.  CT images were obtained.  Surface markings were placed.  The CT images were loaded into the planning software.  Then the target and avoidance structures were contoured.  Treatment planning then occurred.  The radiation prescription was entered and confirmed.  Then, I designed and supervised the construction of a total of 3 medically necessary complex treatment devices.  I have requested : 3D Simulation  I have requested a DVH of the following structures: heart, lungs, lumpectomy cavity.  I have ordered:CBC  PLAN:  The patient will receive 42.72 Gy in 16 fractions followed by a boost to the lumpectomy cavity of 10 Gy for a cumulative dose of 52.72 Gy. Patient will be treated with hypo-fractionated accelerated radiation therapy. She also be treated with a deep inspiration breath hold technique to limit dose to the heart.  -----------------------------------  Blair Promise, PhD, MD

## 2016-12-25 NOTE — Progress Notes (Addendum)
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 This showed a possible mass in the left breast. On 07/02/2016 she underwent left diagnostic mammography with tomography and left breast ultrasonography. The breast density was category C. In the left breast upper outer quadrant there was a microlobulated mass which was not directly palpable, although there was slight thickening in the left breast 1:00 position. Targeted ultrasonography confirmed a solid hypoechoic microlobulated mass in the left breast 1:00 radiant 4 cm from the nipple, measuring 0.8 cm.  On 07/03/2016 Jamie [name] underwent biopsy of the left breast mass in question, and this showed ([id number]) invasive ductal carcinoma, grade 2 or 3, with extracellular mucin, estrogen receptor 5% positive with weak staining intensity, progesterone receptor negative, with an MIB-1 of 50%, and HER-2 amplified, the signals ratio being 5.71 and the number per cell 14.55.  Her subsequent history is as detailed below  INTERVAL HISTORY: Jamie [name] returns today for follow-up and treatment of her HER-2 positive breast cancer, which was weakly estrogen receptor positive, accompanied by her husband. She completed  her chemotherapy last week, this being day 15 cycle 4 of carboplatin and gemcitabine which she received on days 1 and 8 of each chemotherapy cycle.  Since her last visit here she has met with Dr. Sondra [name] and is scheduled for CT simulation later today  She will continue the Herceptin to total one year and her next dose is due 01/15/2017. Her next echocardiogram is scheduled for 01/24/2017.  REVIEW OF SYSTEMS: Jamie [name] tolerated her final chemotherapy dose without any unusual side effects except for the fact that on 12/23/2016 she presented to the emergency room with visual changes. She describes a fault in her visual fields which persisted whether she kept either eye open or closed, and was not a blind spot so much as a blurry spot. It went away and then came back which is the reason she went to the emergency room. It was accompanied by mild throbbing headache as she describes it particularly involving the temples. She was evaluated in the emergency room where they suggested an MRI of the brain but by that time the problem had resolved and she decided to leave before that procedure was performed. She has not had a recurrence since that time although she has a mild headache this morning which she is treating with Tylenol. Aside from that she has the usual fatigue. She feels a little bit short of breath climbing stairs, and she has some low back pain, none of which are new symptoms. A detailed review of systems today was otherwise stable  PAST MEDICAL HISTORY: Past Medical History:  Diagnosis Date  . Anxiety   . Arthritis    feet  . Basal cell carcinoma   . Breast cancer ([place]) 06/2016   left breast  . Eczema   . Genetic testing 08/15/2016   Jamie [name] underwent  genetic testing for hereditary cancer syndrome through [facility]'s 43-gene Common Hereditary Cancers Panel. 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 Result report is  dated 08/15/2016. Please see genetic counseling documentation from 08/17/2016 for further discussion.  [name] PONV (postoperative nausea and vomiting)     PAST SURGICAL HISTORY: Past Surgical History:  Procedure Laterality Date  . BONE SPUR  2001 AND 1988  . BREAST LUMPECTOMY WITH RADIOACTIVE SEED AND SENTINEL LYMPH NODE BIOPSY Left 07/26/2016   Procedure: BREAST LUMPECTOMY WITH RADIOACTIVE SEED AND SENTINEL LYMPH NODE BIOPSY;  Surgeon: Jamie Klein, MD;  Location: [facility];  Service: General;  Laterality: Left;  . BUNIONECTOMY  10/13  . COLONOSCOPY W/ POLYPECTOMY  08/2007  . DILATION AND CURETTAGE OF UTERUS  2002  . MOHS SURGERY  2010  . PORTACATH PLACEMENT Right 07/26/2016   Procedure: INSERTION PORT-A-CATH;  Surgeon: Jamie Klein, MD;  Location: [facility];  Service: General;  Laterality: Right;    FAMILY HISTORY Family History  Problem Relation Age of Onset  . Ovarian cancer Mother 24  . Hypertension Father   . Stroke Father   . Heart disease Father   . Breast cancer Maternal Aunt 77       recurred at 78  . Heart disease Paternal Grandmother   . Osteoporosis Sister   . Liver cancer Maternal Uncle 18  The patient's father died at age 8, with some form of skin cancer, most likely melanoma. The patient's mother died at the age of 30 with ovarian cancer, which had been diagnosed a few months prior. The patient had no brothers, 1 sister. A maternal aunt was diagnosed with breast cancer at age 21, recurrent age 69.  GYNECOLOGIC HISTORY:  No LMP recorded. Patient is postmenopausal. Menarche age 89, the patient is GX P0. She stopped having periods approximately age 80. 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 Her husband Jamie [name] worked as a Dance movement psychotherapist for a [employer]. Their last name is pronounced foh-TEE-ah and they tell me it means "burning" in [place]. Their children  are adopted. Jamie [name] lives in [place] and works in Press photographer, and [name] lives in [place] and is an Scientist, water quality. The patient has 3 grandchildren. She is not a Ambulance person.    ADVANCED DIRECTIVES: In place   HEALTH MAINTENANCE: Social History  Substance Use Topics  . Smoking status: Never Smoker  . Smokeless tobacco: Never Used  . Alcohol use 0.0 oz/week     Comment: 1-2     Colonoscopy:2009  ZOX:WRUEAV 2018  Bone density:April 2016   Allergies  Allergen Reactions  . Codeine Nausea Only and Other (See Comments)    Dizzy  . Sulfamethoxazole Nausea And Vomiting  . Adhesive [Tape]     Current Outpatient Prescriptions  Medication Sig Dispense Refill  . CALCIUM PO Take 500 mg by mouth 2 (two) times daily. Skeletal strength 388m Calium    . cetirizine (ZYRTEC) 10 MG tablet Take 10 mg by mouth daily.    . Cholecalciferol (VITAMIN D3) 1000 units CAPS Take 1 capsule by mouth daily.    . cholestyramine (QUESTRAN) 4 g packet Take 1 packet (4 g total) by mouth 2 (two) times daily. Until diarrhea resolves (Patient not taking: Reported on 12/19/2016) 14 each 0  . Cyanocobalamin (B-12) 2500 MCG TABS Take by mouth.    .[name]  diphenhydrAMINE (BENADRYL) 25 mg capsule Take 25 mg by mouth every 6 (six) hours as needed.    . Lactobacillus (ACIDOPHILUS PROBIOTIC PO) Take by mouth daily.    [name] lidocaine-prilocaine (EMLA) cream Apply one application to port 1-2 hours prior to access. Cover with saran wrap. 30 g 3  . Melatonin 3 MG TABS Take 6 mg by mouth as needed.    . Multiple Vitamin (MULTIVITAMIN) tablet Take 1 tablet by mouth daily.    . prochlorperazine (COMPAZINE) 10 MG tablet Take 10 mg by mouth every 6 (six) hours as needed.    . SYMBICORT 160-4.5 MCG/ACT inhaler INL 2 PFS ITL BID  1  . venlafaxine XR (EFFEXOR-XR) 75 MG 24 hr capsule Take 1 capsule (75 mg total) by mouth daily with breakfast. (Patient not taking: Reported on 12/19/2016) 30 capsule 6   No current facility-administered medications  for this visit.     OBJECTIVE: Middle-aged white womanWho looks well  Vitals:   12/25/16 0845  BP: (!) 118/59  Pulse: 83  Resp: 18  Temp: 98.4 F (36.9 C)     Body mass index is 24.87 kg/m.   Filed Weights   12/25/16 0845  Weight: 154 lb 1.6 oz (69.9 kg)     ECOG FS:1 - Symptomatic but completely ambulatory  Sclerae unicteric, pupils round and equal Oropharynx clear and moist No cervical or supraclavicular adenopathy Lungs no rales or rhonchi Heart regular rate and rhythm Abd soft, nontender, positive bowel sounds MSK no focal spinal tenderness, no upper extremity lymphedema Neuro: nonfocal, well oriented, appropriate affect Breasts: deferred    LAB RESULTS:  CMP     Component Value Date/Time   NA 142 12/22/2016 2105   NA 142 12/18/2016 0751   K 3.8 12/22/2016 2105   K 4.0 12/18/2016 0751   CL 106 12/22/2016 2105   CO2 29 12/22/2016 2105   CO2 26 12/18/2016 0751   GLUCOSE 98 12/22/2016 2105   GLUCOSE 81 12/18/2016 0751   BUN 15 12/22/2016 2105   BUN 13.8 12/18/2016 0751   CREATININE 0.73 12/22/2016 2105   CREATININE 0.8 12/18/2016 0751   CALCIUM 9.3 12/22/2016 2105   CALCIUM 9.4 12/18/2016 0751   PROT 6.7 12/22/2016 2105   PROT 6.6 12/18/2016 0751   ALBUMIN 4.2 12/22/2016 2105   ALBUMIN 3.8 12/18/2016 0751   AST 23 12/22/2016 2105   AST 24 12/18/2016 0751   ALT 29 12/22/2016 2105   ALT 34 12/18/2016 0751   ALKPHOS 123 12/22/2016 2105   ALKPHOS 81 12/18/2016 0751   BILITOT 0.2 (L) 12/22/2016 2105   BILITOT <0.22 12/18/2016 0751   GFRNONAA >60 12/22/2016 2105   GFRNONAA 86 08/04/2015 0831   GFRAA >60 12/22/2016 2105   GFRAA >89 08/04/2015 0831    INo results found for: SPEP, UPEP  Lab Results  Component Value Date   WBC 13.1 (H) 12/25/2016   NEUTROABS 10.4 (H) 12/25/2016   HGB 9.6 (L) 12/25/2016   HCT 28.5 (L) 12/25/2016   MCV 97.2 12/25/2016   PLT 252 12/25/2016      Chemistry      Component Value Date/Time   NA 142 12/22/2016 2105     NA 142 12/18/2016 0751   K 3.8 12/22/2016 2105   K 4.0 12/18/2016 0751   CL 106 12/22/2016 2105   CO2 29 12/22/2016 2105   CO2 26 12/18/2016 0751   BUN 15 12/22/2016 2105   BUN 13.8 12/18/2016 0751   CREATININE 0.73 12/22/2016 2105  CREATININE 0.8 12/18/2016 0751   GLU 73 08/02/2014      Component Value Date/Time   CALCIUM 9.3 12/22/2016 2105   CALCIUM 9.4 12/18/2016 0751   ALKPHOS 123 12/22/2016 2105   ALKPHOS 81 12/18/2016 0751   AST 23 12/22/2016 2105   AST 24 12/18/2016 0751   ALT 29 12/22/2016 2105   ALT 34 12/18/2016 0751   BILITOT 0.2 (L) 12/22/2016 2105   BILITOT <0.22 12/18/2016 0751       No results found for: LABCA2  No components found for: LABCA125   Recent Labs Lab 12/22/16 2105  INR 0.94    Urinalysis    Component Value Date/Time   COLORURINE COLORLESS (A) 12/22/2016 2105   APPEARANCEUR CLEAR 12/22/2016 2105   LABSPEC 1.005 12/22/2016 2105   PHURINE 7.0 12/22/2016 2105   GLUCOSEU NEGATIVE 12/22/2016 2105   HGBUR NEGATIVE 12/22/2016 2105   [place] NEGATIVE 12/22/2016 2105   [place] NEGATIVE 12/22/2016 2105   PROTEINUR NEGATIVE 12/22/2016 2105   NITRITE NEGATIVE 12/22/2016 2105   LEUKOCYTESUR NEGATIVE 12/22/2016 2105     STUDIES: No results found.   ELIGIBLE FOR AVAILABLE RESEARCH PROTOCOL: no  ASSESSMENT: 61 y.o. Jamie [name], Jamie [name] woman status post biopsy of the left breast upper outer quadrant lesion 07/03/2016 showing a clinical T1b N0, stage 1B invasive ductal carcinoma, grade 2 or 3, estrogen receptor weakly positive at 5%, progesterone receptor negative, but HER-2 strongly amplified, with an MIB-1 of 50%.  (1) status post left lumpectomy with sentinel lymph node sampling 07/26/2016 for a pT1c pN0, stage IA invasive ductal carcinoma, grade 3, with extracellular mucin, and negative margins  (2) chemotherapy consisting of Paclitaxel weekly starting 09/18/2016, discontinued after one cycle due to peripheral neuropathy.   (a)  Gemcitabine and Carboplatin started 10/02/2016, repeated days 1 and 8 of each 21 day cycle to a total of 8 doses (4 cycles)-- final dose 12/18/2016  (3) trastuzumab started 08/07/2016, to continue for 12 months  (a) baseline echocardiogram 07/25/2016 shows an ejection fraction of 60-65%  (b) repeat echocardiogram 10/22/2016 shows stable ejection fraction  (4) adjuvant radiation pending  (5) anti-estrogens to follow at the completion of radiation  (a) will avoid tamoxifen given the history of monoclonal allelic MUTYH mutation   (6) genetics testing 08/15/2016 through the [facility]'s 43-gene Common Hereditary Cancers Panel showed a pathogenic variant called, MUTYH, 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   (a) associated with increased colorectal cancer risk, possibly breast cancer (at least in Sephardic Jews)     PLAN: Yaris has completed her chemotherapy and is recovering nicely from it. I would expect that within 6 weeks she will have a more normal hemoglobin although she still will not be fully recovered as far as her normal functional status is concerned.  It is remarkable that she never lost her hair.  We are continuing the Herceptin to total one year since she did not receive the standard treatment but the decreased intensity chemotherapy given her overall good prognosis. She is already scheduled for her next echocardiogram the first week in September.  I'm going to see her again on September 12. Around that time is when patients tend to experience posttraumatic stress symptoms and we will be monitoring that. I also encouraged her to sign up for our finding her new normal program.  Finally she will need a colonoscopy sometime this year related to her genetic mutation. We discussed that at length today  As far as her visual symptoms she will let me know if they recur. 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 If she does have recurrent episodes I will treat her with Imitrex.  She knows to call for  any other problems that may develop before the next visit here.  Jamie [name]  12/25/2016 9:24 AM Medical Oncology and Hematology [facility] [address] [place], [place] [address] Tel. [phone]    Fax. [phone]  Addendum: The patient requests that we stop the Benadryl so she feels more comfortable driving herself here and back. Will give that a try.

## 2016-12-25 NOTE — Patient Instructions (Signed)
Implanted Port Home Guide An implanted port is a type of central line that is placed under the skin. Central lines are used to provide IV access when treatment or nutrition needs to be given through a person's veins. Implanted ports are used for long-term IV access. An implanted port may be placed because:  You need IV medicine that would be irritating to the small veins in your hands or arms.  You need long-term IV medicines, such as antibiotics.  You need IV nutrition for a long period.  You need frequent blood draws for lab tests.  You need dialysis.  Implanted ports are usually placed in the chest area, but they can also be placed in the upper arm, the abdomen, or the leg. An implanted port has two main parts:  Reservoir. The reservoir is round and will appear as a small, raised area under your skin. The reservoir is the part where a needle is inserted to give medicines or draw blood.  Catheter. The catheter is a thin, flexible tube that extends from the reservoir. The catheter is placed into a large vein. Medicine that is inserted into the reservoir goes into the catheter and then into the vein.  How will I care for my incision site? Do not get the incision site wet. Bathe or shower as directed by your health care provider. How is my port accessed? Special steps must be taken to access the port:  Before the port is accessed, a numbing cream can be placed on the skin. This helps numb the skin over the port site.  Your health care provider uses a sterile technique to access the port. ? Your health care provider must put on a mask and sterile gloves. ? The skin over your port is cleaned carefully with an antiseptic and allowed to dry. ? The port is gently pinched between sterile gloves, and a needle is inserted into the port.  Only "non-coring" port needles should be used to access the port. Once the port is accessed, a blood return should be checked. This helps ensure that the port  is in the vein and is not clogged.  If your port needs to remain accessed for a constant infusion, a clear (transparent) bandage will be placed over the needle site. The bandage and needle will need to be changed every week, or as directed by your health care provider.  Keep the bandage covering the needle clean and dry. Do not get it wet. Follow your health care provider's instructions on how to take a shower or bath while the port is accessed.  If your port does not need to stay accessed, no bandage is needed over the port.  What is flushing? Flushing helps keep the port from getting clogged. Follow your health care provider's instructions on how and when to flush the port. Ports are usually flushed with saline solution or a medicine called heparin. The need for flushing will depend on how the port is used.  If the port is used for intermittent medicines or blood draws, the port will need to be flushed: ? After medicines have been given. ? After blood has been drawn. ? As part of routine maintenance.  If a constant infusion is running, the port may not need to be flushed.  How long will my port stay implanted? The port can stay in for as long as your health care provider thinks it is needed. When it is time for the port to come out, surgery will be   done to remove it. The procedure is similar to the one performed when the port was put in. When should I seek immediate medical care? When you have an implanted port, you should seek immediate medical care if:  You notice a bad smell coming from the incision site.  You have swelling, redness, or drainage at the incision site.  You have more swelling or pain at the port site or the surrounding area.  You have a fever that is not controlled with medicine.  This information is not intended to replace advice given to you by your health care provider. Make sure you discuss any questions you have with your health care provider. Document  Released: 05/07/2005 Document Revised: 10/13/2015 Document Reviewed: 01/12/2013 Elsevier Interactive Patient Education  2017 Elsevier Inc.  

## 2016-12-26 NOTE — Addendum Note (Signed)
Addended by: [name] on: 12/26/2016 06:25 AM   Modules accepted: Orders

## 2016-12-27 DIAGNOSIS — Z510 Encounter for antineoplastic radiation therapy: Secondary | ICD-10-CM | POA: Diagnosis not present

## 2016-12-28 ENCOUNTER — Encounter: Payer: Self-pay | Admitting: *Deleted

## 2016-12-28 NOTE — Progress Notes (Signed)
[place] Psychosocial Distress Screening Clinical Social Work  Clinical Social Work was referred by distress screening protocol.  The patient scored a 5 on the Psychosocial Distress Thermometer which indicates moderate distress. Clinical Social Worker reviewed chart and phoned pt to assess for distress and other psychosocial needs. CSW introduced self, explained role of CSW/Pt and Family Support Team, support groups and other resources to assist. Pt lives in [place] and transportation concerns are limited, but she is very interested in gas cards during treatment. CSW educated pt on how to obtain through financial counselors. Pt reports her anxiety is much better, but she is open to programs through the support team and plans to reach out when she starts treatment. CSW team available as needed.    ONCBCN DISTRESS SCREENING 12/19/2016  Screening Type Initial Screening  Distress experienced in past week (1-10) 5  Emotional problem type Nervousness/Anxiety  Information Concerns Type Lack of info about treatment  Physical Problem type Constipation/diarrhea  Referral to clinical social work   Referral to support programs     Clinical Social Worker follow up needed: No.  If yes, follow up plan:  Loren Racer, LCSW, OSW-C Clinical Social Worker [place]  [facility] Phone: [phone] Fax: [phone]

## 2017-01-01 ENCOUNTER — Ambulatory Visit: Payer: Managed Care, Other (non HMO)

## 2017-01-01 ENCOUNTER — Ambulatory Visit
Admission: RE | Admit: 2017-01-01 | Discharge: 2017-01-01 | Disposition: A | Discharge status: Home / Self Care | Payer: Managed Care, Other (non HMO) | Source: Ambulatory Visit | Attending: Radiation Oncology | Admitting: Radiation Oncology

## 2017-01-01 ENCOUNTER — Other Ambulatory Visit: Payer: Managed Care, Other (non HMO)

## 2017-01-01 DIAGNOSIS — C50412 Malignant neoplasm of upper-outer quadrant of left female breast: Secondary | ICD-10-CM

## 2017-01-01 DIAGNOSIS — Z170 Estrogen receptor positive status [ER+]: Principal | ICD-10-CM

## 2017-01-01 DIAGNOSIS — Z510 Encounter for antineoplastic radiation therapy: Secondary | ICD-10-CM | POA: Diagnosis not present

## 2017-01-02 ENCOUNTER — Telehealth: Payer: Self-pay | Admitting: Gastroenterology

## 2017-01-02 ENCOUNTER — Ambulatory Visit
Admission: RE | Admit: 2017-01-02 | Discharge: 2017-01-02 | Disposition: A | Discharge status: Home / Self Care | Payer: Managed Care, Other (non HMO) | Source: Ambulatory Visit | Attending: Radiation Oncology | Admitting: Radiation Oncology

## 2017-01-02 ENCOUNTER — Telehealth: Payer: Self-pay | Admitting: Oncology

## 2017-01-02 DIAGNOSIS — C50412 Malignant neoplasm of upper-outer quadrant of left female breast: Secondary | ICD-10-CM

## 2017-01-02 DIAGNOSIS — Z510 Encounter for antineoplastic radiation therapy: Secondary | ICD-10-CM | POA: Diagnosis not present

## 2017-01-02 DIAGNOSIS — Z170 Estrogen receptor positive status [ER+]: Principal | ICD-10-CM

## 2017-01-02 MED ORDER — RADIAPLEXRX EX GEL
Freq: Once | CUTANEOUS | Status: AC
  Administered 2017-01-02: 09:00:00 via TOPICAL

## 2017-01-02 MED ORDER — ALRA NON-METALLIC DEODORANT (RAD-ONC)
1.0000 "application " | Freq: Once | TOPICAL | Status: AC
  Administered 2017-01-02: 1 via TOPICAL

## 2017-01-02 NOTE — Telephone Encounter (Signed)
Patient walked into the office with colon/path report. She says that she is due for another colon and is requesting to see Dr. Havery Moros. Patient did say that she is undergoing radiation treatment for breast cancer and will be done on 9/14. Records placed on Dr. Doyne Keel desk for review.

## 2017-01-02 NOTE — Telephone Encounter (Addendum)
Patient called asking when she would received her skin care products as she is starting radiation today.  Advised her that I will leave radiaplex and alra deodorant for her to use at the treatment machine as she is being treated after 5 today.  Let her know that I will meet with her to discuss side effects later in the week.  She verbalized agreement and understanding.

## 2017-01-03 ENCOUNTER — Ambulatory Visit
Admission: RE | Admit: 2017-01-03 | Discharge: 2017-01-03 | Disposition: A | Discharge status: Home / Self Care | Payer: Managed Care, Other (non HMO) | Source: Ambulatory Visit | Attending: Radiation Oncology | Admitting: Radiation Oncology

## 2017-01-03 DIAGNOSIS — Z510 Encounter for antineoplastic radiation therapy: Secondary | ICD-10-CM | POA: Diagnosis not present

## 2017-01-04 ENCOUNTER — Ambulatory Visit
Admission: RE | Admit: 2017-01-04 | Discharge: 2017-01-04 | Disposition: A | Discharge status: Home / Self Care | Payer: Managed Care, Other (non HMO) | Source: Ambulatory Visit | Attending: Radiation Oncology | Admitting: Radiation Oncology

## 2017-01-04 DIAGNOSIS — Z510 Encounter for antineoplastic radiation therapy: Secondary | ICD-10-CM | POA: Diagnosis not present

## 2017-01-04 NOTE — Telephone Encounter (Signed)
Dr.Armbruster reviewed records and accepted for patient to be scheduled for an office visit to discuss having a colonoscopy.Patient has been scheduled for an office visit with Dr.Armbruster next available.

## 2017-01-07 ENCOUNTER — Ambulatory Visit
Admission: RE | Admit: 2017-01-07 | Discharge: 2017-01-07 | Disposition: A | Discharge status: Home / Self Care | Payer: Managed Care, Other (non HMO) | Source: Ambulatory Visit | Attending: Radiation Oncology | Admitting: Radiation Oncology

## 2017-01-07 DIAGNOSIS — Z510 Encounter for antineoplastic radiation therapy: Secondary | ICD-10-CM | POA: Diagnosis not present

## 2017-01-08 ENCOUNTER — Ambulatory Visit
Admission: RE | Admit: 2017-01-08 | Discharge: 2017-01-08 | Disposition: A | Discharge status: Home / Self Care | Payer: Managed Care, Other (non HMO) | Source: Ambulatory Visit | Attending: Radiation Oncology | Admitting: Radiation Oncology

## 2017-01-08 DIAGNOSIS — Z510 Encounter for antineoplastic radiation therapy: Secondary | ICD-10-CM | POA: Diagnosis not present

## 2017-01-09 ENCOUNTER — Other Ambulatory Visit (HOSPITAL_BASED_OUTPATIENT_CLINIC_OR_DEPARTMENT_OTHER): Payer: Managed Care, Other (non HMO)

## 2017-01-09 ENCOUNTER — Ambulatory Visit
Admission: RE | Admit: 2017-01-09 | Discharge: 2017-01-09 | Disposition: A | Discharge status: Home / Self Care | Payer: Managed Care, Other (non HMO) | Source: Ambulatory Visit | Attending: Radiation Oncology | Admitting: Radiation Oncology

## 2017-01-09 ENCOUNTER — Ambulatory Visit (HOSPITAL_BASED_OUTPATIENT_CLINIC_OR_DEPARTMENT_OTHER): Payer: Managed Care, Other (non HMO)

## 2017-01-09 ENCOUNTER — Ambulatory Visit: Payer: Managed Care, Other (non HMO)

## 2017-01-09 VITALS — BP 113/56 | HR 67 | Temp 98.6°F | Resp 16

## 2017-01-09 DIAGNOSIS — Z95828 Presence of other vascular implants and grafts: Secondary | ICD-10-CM

## 2017-01-09 DIAGNOSIS — Z5112 Encounter for antineoplastic immunotherapy: Secondary | ICD-10-CM

## 2017-01-09 DIAGNOSIS — Z170 Estrogen receptor positive status [ER+]: Secondary | ICD-10-CM

## 2017-01-09 DIAGNOSIS — Z510 Encounter for antineoplastic radiation therapy: Secondary | ICD-10-CM | POA: Diagnosis not present

## 2017-01-09 DIAGNOSIS — C50412 Malignant neoplasm of upper-outer quadrant of left female breast: Secondary | ICD-10-CM

## 2017-01-09 LAB — CBC WITH DIFFERENTIAL/PLATELET
BASO%: 1.5 % (ref 0.0–2.0)
Basophils Absolute: 0.1 10*3/uL (ref 0.0–0.1)
EOS%: 2.8 % (ref 0.0–7.0)
Eosinophils Absolute: 0.1 10*3/uL (ref 0.0–0.5)
HCT: 31.8 % — ABNORMAL LOW (ref 34.8–46.6)
HEMOGLOBIN: 10.8 g/dL — AB (ref 11.6–15.9)
LYMPH%: 24.9 % (ref 14.0–49.7)
MCH: 33.6 pg (ref 25.1–34.0)
MCHC: 34 g/dL (ref 31.5–36.0)
MCV: 98.7 fL (ref 79.5–101.0)
MONO#: 0.6 10*3/uL (ref 0.1–0.9)
MONO%: 12.9 % (ref 0.0–14.0)
NEUT%: 57.9 % (ref 38.4–76.8)
NEUTROS ABS: 2.6 10*3/uL (ref 1.5–6.5)
Platelets: 532 10*3/uL — ABNORMAL HIGH (ref 145–400)
RBC: 3.23 10*6/uL — AB (ref 3.70–5.45)
RDW: 19.6 % — AB (ref 11.2–14.5)
WBC: 4.4 10*3/uL (ref 3.9–10.3)
lymph#: 1.1 10*3/uL (ref 0.9–3.3)

## 2017-01-09 LAB — COMPREHENSIVE METABOLIC PANEL
ALBUMIN: 3.8 g/dL (ref 3.5–5.0)
ALK PHOS: 76 U/L (ref 40–150)
ALT: 17 U/L (ref 0–55)
AST: 20 U/L (ref 5–34)
Anion Gap: 6 mEq/L (ref 3–11)
BILIRUBIN TOTAL: 0.31 mg/dL (ref 0.20–1.20)
BUN: 11.8 mg/dL (ref 7.0–26.0)
CO2: 27 meq/L (ref 22–29)
Calcium: 9.6 mg/dL (ref 8.4–10.4)
Chloride: 108 mEq/L (ref 98–109)
Creatinine: 0.8 mg/dL (ref 0.6–1.1)
EGFR: 83 mL/min/{1.73_m2} — AB (ref >90)
GLUCOSE: 88 mg/dL (ref 70–140)
Potassium: 4.2 mEq/L (ref 3.5–5.1)
SODIUM: 141 meq/L (ref 136–145)
TOTAL PROTEIN: 7 g/dL (ref 6.4–8.3)

## 2017-01-09 MED ORDER — SODIUM CHLORIDE 0.9% FLUSH
10.0000 mL | Freq: Once | INTRAVENOUS | Status: AC
  Administered 2017-01-09: 10 mL
  Filled 2017-01-09: qty 10

## 2017-01-09 MED ORDER — DIPHENHYDRAMINE HCL 25 MG PO CAPS
ORAL_CAPSULE | ORAL | Status: AC
  Filled 2017-01-09: qty 1

## 2017-01-09 MED ORDER — SODIUM CHLORIDE 0.9 % IV SOLN
Freq: Once | INTRAVENOUS | Status: AC
  Administered 2017-01-09: 10:00:00 via INTRAVENOUS

## 2017-01-09 MED ORDER — ACETAMINOPHEN 325 MG PO TABS
650.0000 mg | ORAL_TABLET | Freq: Once | ORAL | Status: AC
  Administered 2017-01-09: 650 mg via ORAL

## 2017-01-09 MED ORDER — ACETAMINOPHEN 325 MG PO TABS
ORAL_TABLET | ORAL | Status: AC
  Filled 2017-01-09: qty 2

## 2017-01-09 MED ORDER — DIPHENHYDRAMINE HCL 25 MG PO CAPS
25.0000 mg | ORAL_CAPSULE | Freq: Once | ORAL | Status: AC
  Administered 2017-01-09: 25 mg via ORAL

## 2017-01-09 MED ORDER — TRASTUZUMAB CHEMO 150 MG IV SOLR
6.0000 mg/kg | Freq: Once | INTRAVENOUS | Status: AC
  Administered 2017-01-09: 399 mg via INTRAVENOUS
  Filled 2017-01-09: qty 19

## 2017-01-09 MED ORDER — SODIUM CHLORIDE 0.9% FLUSH
10.0000 mL | INTRAVENOUS | Status: DC | PRN
  Administered 2017-01-09: 10 mL
  Filled 2017-01-09: qty 10

## 2017-01-09 MED ORDER — HEPARIN SOD (PORK) LOCK FLUSH 100 UNIT/ML IV SOLN
500.0000 [IU] | Freq: Once | INTRAVENOUS | Status: AC | PRN
  Administered 2017-01-09: 500 [IU]
  Filled 2017-01-09: qty 5

## 2017-01-09 NOTE — Patient Instructions (Signed)
[facility] Discharge Instructions for Patients Receiving Chemotherapy  Today you received the following chemotherapy agents: Herceptin   To help prevent nausea and vomiting after your treatment, we encourage you to take your nausea medication as directed.    If you develop nausea and vomiting that is not controlled by your nausea medication, call the clinic.   BELOW ARE SYMPTOMS THAT SHOULD BE REPORTED IMMEDIATELY:  *FEVER GREATER THAN 100.5 F  *CHILLS WITH OR WITHOUT FEVER  NAUSEA AND VOMITING THAT IS NOT CONTROLLED WITH YOUR NAUSEA MEDICATION  *UNUSUAL SHORTNESS OF BREATH  *UNUSUAL BRUISING OR BLEEDING  TENDERNESS IN MOUTH AND THROAT WITH OR WITHOUT PRESENCE OF ULCERS  *URINARY PROBLEMS  *BOWEL PROBLEMS  UNUSUAL RASH Items with * indicate a potential emergency and should be followed up as soon as possible.  Feel free to call the clinic you have any questions or concerns. The clinic phone number is [phone].  Please show the CHEMO ALERT CARD at check-in to the Emergency Department and triage nurse.   

## 2017-01-09 NOTE — Patient Instructions (Signed)
Implanted Port Home Guide An implanted port is a type of central line that is placed under the skin. Central lines are used to provide IV access when treatment or nutrition needs to be given through a person's veins. Implanted ports are used for long-term IV access. An implanted port may be placed because:  You need IV medicine that would be irritating to the small veins in your hands or arms.  You need long-term IV medicines, such as antibiotics.  You need IV nutrition for a long period.  You need frequent blood draws for lab tests.  You need dialysis.  Implanted ports are usually placed in the chest area, but they can also be placed in the upper arm, the abdomen, or the leg. An implanted port has two main parts:  Reservoir. The reservoir is round and will appear as a small, raised area under your skin. The reservoir is the part where a needle is inserted to give medicines or draw blood.  Catheter. The catheter is a thin, flexible tube that extends from the reservoir. The catheter is placed into a large vein. Medicine that is inserted into the reservoir goes into the catheter and then into the vein.  How will I care for my incision site? Do not get the incision site wet. Bathe or shower as directed by your health care provider. How is my port accessed? Special steps must be taken to access the port:  Before the port is accessed, a numbing cream can be placed on the skin. This helps numb the skin over the port site.  Your health care provider uses a sterile technique to access the port. ? Your health care provider must put on a mask and sterile gloves. ? The skin over your port is cleaned carefully with an antiseptic and allowed to dry. ? The port is gently pinched between sterile gloves, and a needle is inserted into the port.  Only "non-coring" port needles should be used to access the port. Once the port is accessed, a blood return should be checked. This helps ensure that the port  is in the vein and is not clogged.  If your port needs to remain accessed for a constant infusion, a clear (transparent) bandage will be placed over the needle site. The bandage and needle will need to be changed every week, or as directed by your health care provider.  Keep the bandage covering the needle clean and dry. Do not get it wet. Follow your health care provider's instructions on how to take a shower or bath while the port is accessed.  If your port does not need to stay accessed, no bandage is needed over the port.  What is flushing? Flushing helps keep the port from getting clogged. Follow your health care provider's instructions on how and when to flush the port. Ports are usually flushed with saline solution or a medicine called heparin. The need for flushing will depend on how the port is used.  If the port is used for intermittent medicines or blood draws, the port will need to be flushed: ? After medicines have been given. ? After blood has been drawn. ? As part of routine maintenance.  If a constant infusion is running, the port may not need to be flushed.  How long will my port stay implanted? The port can stay in for as long as your health care provider thinks it is needed. When it is time for the port to come out, surgery will be   done to remove it. The procedure is similar to the one performed when the port was put in. When should I seek immediate medical care? When you have an implanted port, you should seek immediate medical care if:  You notice a bad smell coming from the incision site.  You have swelling, redness, or drainage at the incision site.  You have more swelling or pain at the port site or the surrounding area.  You have a fever that is not controlled with medicine.  This information is not intended to replace advice given to you by your health care provider. Make sure you discuss any questions you have with your health care provider. Document  Released: 05/07/2005 Document Revised: 10/13/2015 Document Reviewed: 01/12/2013 Elsevier Interactive Patient Education  2017 Elsevier Inc.  

## 2017-01-10 ENCOUNTER — Ambulatory Visit
Admission: RE | Admit: 2017-01-10 | Discharge: 2017-01-10 | Disposition: A | Discharge status: Home / Self Care | Payer: Managed Care, Other (non HMO) | Source: Ambulatory Visit | Attending: Radiation Oncology | Admitting: Radiation Oncology

## 2017-01-10 DIAGNOSIS — Z510 Encounter for antineoplastic radiation therapy: Secondary | ICD-10-CM | POA: Diagnosis not present

## 2017-01-11 ENCOUNTER — Ambulatory Visit
Admission: RE | Admit: 2017-01-11 | Discharge: 2017-01-11 | Disposition: A | Discharge status: Home / Self Care | Payer: Managed Care, Other (non HMO) | Source: Ambulatory Visit | Attending: Radiation Oncology | Admitting: Radiation Oncology

## 2017-01-11 DIAGNOSIS — Z510 Encounter for antineoplastic radiation therapy: Secondary | ICD-10-CM | POA: Diagnosis not present

## 2017-01-14 ENCOUNTER — Ambulatory Visit
Admission: RE | Admit: 2017-01-14 | Discharge: 2017-01-14 | Disposition: A | Discharge status: Home / Self Care | Payer: Managed Care, Other (non HMO) | Source: Ambulatory Visit | Attending: Radiation Oncology | Admitting: Radiation Oncology

## 2017-01-14 DIAGNOSIS — Z510 Encounter for antineoplastic radiation therapy: Secondary | ICD-10-CM | POA: Diagnosis not present

## 2017-01-15 ENCOUNTER — Ambulatory Visit
Admission: RE | Admit: 2017-01-15 | Discharge: 2017-01-15 | Disposition: A | Discharge status: Home / Self Care | Payer: Managed Care, Other (non HMO) | Source: Ambulatory Visit | Attending: Radiation Oncology | Admitting: Radiation Oncology

## 2017-01-15 DIAGNOSIS — Z170 Estrogen receptor positive status [ER+]: Principal | ICD-10-CM

## 2017-01-15 DIAGNOSIS — C50412 Malignant neoplasm of upper-outer quadrant of left female breast: Secondary | ICD-10-CM

## 2017-01-15 DIAGNOSIS — Z510 Encounter for antineoplastic radiation therapy: Secondary | ICD-10-CM | POA: Diagnosis not present

## 2017-01-15 MED ORDER — RADIAPLEXRX EX GEL
Freq: Once | CUTANEOUS | Status: AC
  Administered 2017-01-15: 15:00:00 via TOPICAL

## 2017-01-16 ENCOUNTER — Ambulatory Visit
Admission: RE | Admit: 2017-01-16 | Discharge: 2017-01-16 | Disposition: A | Discharge status: Home / Self Care | Payer: Managed Care, Other (non HMO) | Source: Ambulatory Visit | Attending: Radiation Oncology | Admitting: Radiation Oncology

## 2017-01-16 DIAGNOSIS — Z510 Encounter for antineoplastic radiation therapy: Secondary | ICD-10-CM | POA: Diagnosis not present

## 2017-01-17 ENCOUNTER — Ambulatory Visit
Admission: RE | Admit: 2017-01-17 | Discharge: 2017-01-17 | Disposition: A | Discharge status: Home / Self Care | Payer: Managed Care, Other (non HMO) | Source: Ambulatory Visit | Attending: Radiation Oncology | Admitting: Radiation Oncology

## 2017-01-17 DIAGNOSIS — Z510 Encounter for antineoplastic radiation therapy: Secondary | ICD-10-CM | POA: Diagnosis not present

## 2017-01-18 ENCOUNTER — Ambulatory Visit
Admission: RE | Admit: 2017-01-18 | Discharge: 2017-01-18 | Disposition: A | Discharge status: Home / Self Care | Payer: Managed Care, Other (non HMO) | Source: Ambulatory Visit | Attending: Radiation Oncology | Admitting: Radiation Oncology

## 2017-01-18 DIAGNOSIS — Z510 Encounter for antineoplastic radiation therapy: Secondary | ICD-10-CM | POA: Diagnosis not present

## 2017-01-22 ENCOUNTER — Ambulatory Visit
Admission: RE | Admit: 2017-01-22 | Discharge: 2017-01-22 | Disposition: A | Discharge status: Home / Self Care | Payer: Managed Care, Other (non HMO) | Source: Ambulatory Visit | Attending: Radiation Oncology | Admitting: Radiation Oncology

## 2017-01-22 DIAGNOSIS — C50412 Malignant neoplasm of upper-outer quadrant of left female breast: Secondary | ICD-10-CM

## 2017-01-22 DIAGNOSIS — Z510 Encounter for antineoplastic radiation therapy: Secondary | ICD-10-CM | POA: Diagnosis not present

## 2017-01-22 DIAGNOSIS — Z170 Estrogen receptor positive status [ER+]: Principal | ICD-10-CM

## 2017-01-23 ENCOUNTER — Ambulatory Visit
Admission: RE | Admit: 2017-01-23 | Discharge: 2017-01-23 | Disposition: A | Discharge status: Home / Self Care | Payer: Managed Care, Other (non HMO) | Source: Ambulatory Visit | Attending: Radiation Oncology | Admitting: Radiation Oncology

## 2017-01-23 DIAGNOSIS — Z510 Encounter for antineoplastic radiation therapy: Secondary | ICD-10-CM | POA: Diagnosis not present

## 2017-01-24 ENCOUNTER — Ambulatory Visit (HOSPITAL_BASED_OUTPATIENT_CLINIC_OR_DEPARTMENT_OTHER)
Admission: RE | Admit: 2017-01-24 | Discharge: 2017-01-24 | Disposition: A | Discharge status: Home / Self Care | Payer: Managed Care, Other (non HMO) | Source: Ambulatory Visit | Attending: Cardiology | Admitting: Cardiology

## 2017-01-24 ENCOUNTER — Ambulatory Visit
Admission: RE | Admit: 2017-01-24 | Discharge: 2017-01-24 | Disposition: A | Discharge status: Home / Self Care | Payer: Managed Care, Other (non HMO) | Source: Ambulatory Visit | Attending: Radiation Oncology | Admitting: Radiation Oncology

## 2017-01-24 ENCOUNTER — Ambulatory Visit (HOSPITAL_COMMUNITY)
Admission: RE | Admit: 2017-01-24 | Discharge: 2017-01-24 | Disposition: A | Discharge status: Home / Self Care | Payer: Managed Care, Other (non HMO) | Source: Ambulatory Visit | Attending: Osteopathic Medicine | Admitting: Osteopathic Medicine

## 2017-01-24 ENCOUNTER — Encounter (HOSPITAL_COMMUNITY): Payer: Self-pay | Admitting: Cardiology

## 2017-01-24 VITALS — BP 102/58 | HR 62 | Wt 151.2 lb

## 2017-01-24 DIAGNOSIS — C50412 Malignant neoplasm of upper-outer quadrant of left female breast: Secondary | ICD-10-CM | POA: Diagnosis not present

## 2017-01-24 DIAGNOSIS — I420 Dilated cardiomyopathy: Secondary | ICD-10-CM | POA: Insufficient documentation

## 2017-01-24 DIAGNOSIS — Z170 Estrogen receptor positive status [ER+]: Secondary | ICD-10-CM

## 2017-01-24 DIAGNOSIS — Z510 Encounter for antineoplastic radiation therapy: Secondary | ICD-10-CM | POA: Diagnosis not present

## 2017-01-24 NOTE — Progress Notes (Signed)
Oncology: Dr. [name]  61 yo with history of breast cancer presents for cardio-oncology evaluation.  Left breast cancer was diagnosed in 2/18, ER+/PR-/HER2+.  She will have lumpectomy followed by Abraxane 12 cycles.  She will get Herceptin x 1 year.    Stable, no exertional dyspnea.  No chest pain.  She has been going to the gym.   PMH: 1. Breast cancer: Left breast cancer diagnosed in 2/18, ER+/PR-/HER2+.  She will have lumpectomy followed by Abraxane 12 cycles.  She will get Herceptin x 1 year to start later this month.  - Echo (3/18): EF 60-65%, GLS -21.8%.  - Echo (6/18): EF 60-65%, GLS -23.5% - Echo (9/18): EF 02-58%, normal diastolic function, GLS -52.7%, normal RV size and systolic function.   Social History   Social History  . Marital status: Married    Spouse name: N/A  . Number of children: N/A  . Years of education: N/A   Occupational History  . retired    Social History Main Topics  . Smoking status: Never Smoker  . Smokeless tobacco: Never Used  . Alcohol use 0.0 oz/week     Comment: 1-2  . Drug use: No  . Sexual activity: Not on file   Other Topics Concern  . Not on file   Social History Narrative  . No narrative on file   Family History  Problem Relation Age of Onset  . Ovarian cancer Mother 72  . Hypertension Father   . Stroke Father   . Heart disease Father   . Breast cancer Maternal Aunt 77       recurred at 33  . Heart disease Paternal Grandmother   . Osteoporosis Sister   . Liver cancer Maternal Uncle 77   ROS: All systems reviewed and negative except as per HPI.   Current Outpatient Prescriptions  Medication Sig Dispense Refill  . CALCIUM PO Take 500 mg by mouth 2 (two) times daily. Skeletal strength 349m Calium    . cetirizine (ZYRTEC) 10 MG tablet Take 10 mg by mouth daily.    . Cholecalciferol (VITAMIN D3) 1000 units CAPS Take 1 capsule by mouth daily.    . Cyanocobalamin (B-12) 2500 MCG TABS Take by mouth.    . diphenhydrAMINE  (BENADRYL) 25 mg capsule Take 25 mg by mouth every 6 (six) hours as needed.    . hyaluronate sodium (RADIAPLEXRX) GEL Apply 1 application topically once.    . Lactobacillus (ACIDOPHILUS PROBIOTIC PO) Take by mouth daily.    .Marland Kitchenlidocaine-prilocaine (EMLA) cream Apply one application to port 1-2 hours prior to access. Cover with saran wrap. 30 g 3  . Melatonin 3 MG TABS Take 6 mg by mouth as needed.    . Multiple Vitamin (MULTIVITAMIN) tablet Take 1 tablet by mouth daily.    . non-metallic deodorant (Jethro Poling MISC Apply 1 application topically daily as needed.    . prochlorperazine (COMPAZINE) 10 MG tablet Take 10 mg by mouth every 6 (six) hours as needed.    . SYMBICORT 160-4.5 MCG/ACT inhaler INL 2 PFS ITL BID  1  . venlafaxine XR (EFFEXOR-XR) 75 MG 24 hr capsule Take 1 capsule (75 mg total) by mouth daily with breakfast. (Patient not taking: Reported on 12/19/2016) 30 capsule 6   No current facility-administered medications for this encounter.    BP (!) 102/58   Pulse 62   Wt 151 lb 4 oz (68.6 kg)   SpO2 100%   BMI 24.41 kg/m  General: NAD General: NAD Neck:  No JVD, no thyromegaly or thyroid nodule.  Lungs: Clear to auscultation bilaterally with normal respiratory effort. CV: Nondisplaced PMI.  Heart regular S1/S2, no S3/S4, no murmur.  No peripheral edema.  No carotid bruit.  Normal pedal pulses.  Abdomen: Soft, nontender, no hepatosplenomegaly, no distention.  Skin: Intact without lesions or rashes.  Neurologic: Alert and oriented x 3.  Psych: Normal affect. Extremities: No clubbing or cyanosis.  HEENT: Normal.   Assessment/Plan: 61 yo with breast cancer, ER+/PR-/HER2+.  She is planned for therapy involving 1 year of Herceptin.  I reviewed today's echo, normal EF and stable global longitudinal strain.  She will continue Herceptin.  She will followup in 3 months with a repeat echo, she will finish Herceptin next Spring.  Loralie Champagne 01/24/2017

## 2017-01-24 NOTE — Addendum Note (Signed)
Encounter addended by: Larey Dresser, MD on: 01/24/2017  2:50 PM<BR>    Actions taken: LOS modified

## 2017-01-24 NOTE — Patient Instructions (Signed)
Your physician has requested that you have an echocardiogram. Echocardiography is a painless test that uses sound waves to create images of your heart. It provides your doctor with information about the size and shape of your heart and how well your heart's chambers and valves are working. This procedure takes approximately one hour. There are no restrictions for this procedure.   Your physician recommends that you schedule a follow-up appointment in: 3 months with echocardiogram   

## 2017-01-24 NOTE — Progress Notes (Signed)
  Echocardiogram 2D Echocardiogram has been performed.  Jamie [name] 01/24/2017, 10:13 AM

## 2017-01-25 ENCOUNTER — Ambulatory Visit
Admission: RE | Admit: 2017-01-25 | Discharge: 2017-01-25 | Disposition: A | Discharge status: Home / Self Care | Payer: Managed Care, Other (non HMO) | Source: Ambulatory Visit | Attending: Radiation Oncology | Admitting: Radiation Oncology

## 2017-01-25 DIAGNOSIS — Z510 Encounter for antineoplastic radiation therapy: Secondary | ICD-10-CM | POA: Diagnosis not present

## 2017-01-28 ENCOUNTER — Ambulatory Visit
Admission: RE | Admit: 2017-01-28 | Discharge: 2017-01-28 | Disposition: A | Discharge status: Home / Self Care | Payer: Managed Care, Other (non HMO) | Source: Ambulatory Visit | Attending: Radiation Oncology | Admitting: Radiation Oncology

## 2017-01-28 DIAGNOSIS — Z510 Encounter for antineoplastic radiation therapy: Secondary | ICD-10-CM | POA: Diagnosis not present

## 2017-01-28 NOTE — Progress Notes (Signed)
Patient reports having more pain in her right nipple area.  The skin appears red and dry.  Advised her to try applying neosporin plus pain to the area.  She said she does not think she needs any pain medication.  She will see Dr. Sondra Come tomorrow.

## 2017-01-29 ENCOUNTER — Ambulatory Visit
Admission: RE | Admit: 2017-01-29 | Discharge: 2017-01-29 | Disposition: A | Discharge status: Home / Self Care | Payer: Managed Care, Other (non HMO) | Source: Ambulatory Visit | Attending: Radiation Oncology | Admitting: Radiation Oncology

## 2017-01-29 DIAGNOSIS — Z510 Encounter for antineoplastic radiation therapy: Secondary | ICD-10-CM | POA: Diagnosis not present

## 2017-01-29 DIAGNOSIS — C50412 Malignant neoplasm of upper-outer quadrant of left female breast: Secondary | ICD-10-CM

## 2017-01-29 DIAGNOSIS — Z170 Estrogen receptor positive status [ER+]: Principal | ICD-10-CM

## 2017-01-29 MED ORDER — RADIAPLEXRX EX GEL
Freq: Once | CUTANEOUS | Status: AC
  Administered 2017-01-29: 15:00:00 via TOPICAL

## 2017-01-30 ENCOUNTER — Ambulatory Visit: Payer: Managed Care, Other (non HMO)

## 2017-01-30 ENCOUNTER — Telehealth: Payer: Self-pay | Admitting: Osteopathic Medicine

## 2017-01-30 ENCOUNTER — Ambulatory Visit (HOSPITAL_BASED_OUTPATIENT_CLINIC_OR_DEPARTMENT_OTHER): Payer: Managed Care, Other (non HMO) | Admitting: Oncology

## 2017-01-30 ENCOUNTER — Ambulatory Visit (HOSPITAL_BASED_OUTPATIENT_CLINIC_OR_DEPARTMENT_OTHER): Payer: Managed Care, Other (non HMO)

## 2017-01-30 ENCOUNTER — Ambulatory Visit
Admission: RE | Admit: 2017-01-30 | Discharge: 2017-01-30 | Disposition: A | Discharge status: Home / Self Care | Payer: Managed Care, Other (non HMO) | Source: Ambulatory Visit | Attending: Radiation Oncology | Admitting: Radiation Oncology

## 2017-01-30 ENCOUNTER — Other Ambulatory Visit (HOSPITAL_BASED_OUTPATIENT_CLINIC_OR_DEPARTMENT_OTHER): Payer: Managed Care, Other (non HMO)

## 2017-01-30 ENCOUNTER — Other Ambulatory Visit: Payer: Managed Care, Other (non HMO)

## 2017-01-30 ENCOUNTER — Encounter: Payer: Self-pay | Admitting: *Deleted

## 2017-01-30 VITALS — BP 103/61 | HR 64 | Temp 98.6°F | Resp 18 | Ht 66.0 in | Wt 151.5 lb

## 2017-01-30 DIAGNOSIS — Z5112 Encounter for antineoplastic immunotherapy: Secondary | ICD-10-CM

## 2017-01-30 DIAGNOSIS — C50412 Malignant neoplasm of upper-outer quadrant of left female breast: Secondary | ICD-10-CM

## 2017-01-30 DIAGNOSIS — Z170 Estrogen receptor positive status [ER+]: Secondary | ICD-10-CM

## 2017-01-30 DIAGNOSIS — Z0000 Encounter for general adult medical examination without abnormal findings: Secondary | ICD-10-CM

## 2017-01-30 DIAGNOSIS — Z510 Encounter for antineoplastic radiation therapy: Secondary | ICD-10-CM | POA: Diagnosis not present

## 2017-01-30 DIAGNOSIS — Z95828 Presence of other vascular implants and grafts: Secondary | ICD-10-CM

## 2017-01-30 LAB — CBC WITH DIFFERENTIAL/PLATELET
BASO%: 0.8 % (ref 0.0–2.0)
Basophils Absolute: 0 10*3/uL (ref 0.0–0.1)
EOS ABS: 0.3 10*3/uL (ref 0.0–0.5)
EOS%: 6 % (ref 0.0–7.0)
HCT: 35.1 % (ref 34.8–46.6)
HGB: 12 g/dL (ref 11.6–15.9)
LYMPH%: 17.6 % (ref 14.0–49.7)
MCH: 33.7 pg (ref 25.1–34.0)
MCHC: 34.2 g/dL (ref 31.5–36.0)
MCV: 98.6 fL (ref 79.5–101.0)
MONO#: 0.5 10*3/uL (ref 0.1–0.9)
MONO%: 8.6 % (ref 0.0–14.0)
NEUT%: 67 % (ref 38.4–76.8)
NEUTROS ABS: 3.5 10*3/uL (ref 1.5–6.5)
PLATELETS: 178 10*3/uL (ref 145–400)
RBC: 3.55 10*6/uL — AB (ref 3.70–5.45)
RDW: 14.8 % — ABNORMAL HIGH (ref 11.2–14.5)
WBC: 5.2 10*3/uL (ref 3.9–10.3)
lymph#: 0.9 10*3/uL (ref 0.9–3.3)

## 2017-01-30 LAB — COMPREHENSIVE METABOLIC PANEL
ALT: 14 U/L (ref 0–55)
ANION GAP: 8 meq/L (ref 3–11)
AST: 21 U/L (ref 5–34)
Albumin: 4.1 g/dL (ref 3.5–5.0)
Alkaline Phosphatase: 60 U/L (ref 40–150)
BUN: 10.6 mg/dL (ref 7.0–26.0)
CALCIUM: 9.8 mg/dL (ref 8.4–10.4)
CHLORIDE: 105 meq/L (ref 98–109)
CO2: 26 meq/L (ref 22–29)
Creatinine: 0.8 mg/dL (ref 0.6–1.1)
EGFR: 78 mL/min/{1.73_m2} — ABNORMAL LOW (ref >90)
Glucose: 90 mg/dl (ref 70–140)
POTASSIUM: 4.4 meq/L (ref 3.5–5.1)
Sodium: 139 mEq/L (ref 136–145)
Total Bilirubin: 0.32 mg/dL (ref 0.20–1.20)
Total Protein: 6.9 g/dL (ref 6.4–8.3)

## 2017-01-30 MED ORDER — ACETAMINOPHEN 325 MG PO TABS
ORAL_TABLET | ORAL | Status: AC
  Filled 2017-01-30: qty 2

## 2017-01-30 MED ORDER — DIPHENHYDRAMINE HCL 25 MG PO CAPS
ORAL_CAPSULE | ORAL | Status: AC
  Filled 2017-01-30: qty 1

## 2017-01-30 MED ORDER — DIPHENHYDRAMINE HCL 25 MG PO CAPS
25.0000 mg | ORAL_CAPSULE | Freq: Once | ORAL | Status: AC
  Administered 2017-01-30: 25 mg via ORAL

## 2017-01-30 MED ORDER — SODIUM CHLORIDE 0.9% FLUSH
10.0000 mL | INTRAVENOUS | Status: DC | PRN
  Administered 2017-01-30: 10 mL
  Filled 2017-01-30: qty 10

## 2017-01-30 MED ORDER — ACETAMINOPHEN 325 MG PO TABS
650.0000 mg | ORAL_TABLET | Freq: Once | ORAL | Status: AC
Start: 2017-01-30 — End: 2017-01-30
  Administered 2017-01-30: 650 mg via ORAL

## 2017-01-30 MED ORDER — SODIUM CHLORIDE 0.9 % IV SOLN
Freq: Once | INTRAVENOUS | Status: AC
  Administered 2017-01-30: 13:00:00 via INTRAVENOUS

## 2017-01-30 MED ORDER — TRASTUZUMAB CHEMO 150 MG IV SOLR
6.0000 mg/kg | Freq: Once | INTRAVENOUS | Status: AC
  Administered 2017-01-30: 399 mg via INTRAVENOUS
  Filled 2017-01-30: qty 19

## 2017-01-30 MED ORDER — HEPARIN SOD (PORK) LOCK FLUSH 100 UNIT/ML IV SOLN
500.0000 [IU] | Freq: Once | INTRAVENOUS | Status: AC | PRN
  Administered 2017-01-30: 500 [IU]
  Filled 2017-01-30: qty 5

## 2017-01-30 MED ORDER — SODIUM CHLORIDE 0.9% FLUSH
10.0000 mL | Freq: Once | INTRAVENOUS | Status: AC
  Administered 2017-01-30: 10 mL
  Filled 2017-01-30: qty 10

## 2017-01-30 NOTE — Progress Notes (Signed)
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 This showed a possible mass in the left breast. On 07/02/2016 she underwent left diagnostic mammography with tomography and left breast ultrasonography. The breast density was category C. In the left breast upper outer quadrant there was a microlobulated mass which was not directly palpable, although there was slight thickening in the left breast 1:00 position. Targeted ultrasonography confirmed a solid hypoechoic microlobulated mass in the left breast 1:00 radiant 4 cm from the nipple, measuring 0.8 cm.  On 07/03/2016 Jamie [name] underwent biopsy of the left breast mass in question, and this showed ([id number]) invasive ductal carcinoma, grade 2 or 3, with extracellular mucin, estrogen receptor 5% positive with weak staining intensity, progesterone receptor negative, with an MIB-1 of 50%, and HER-2 amplified, the signals ratio being 5.71 and the number per cell 14.55.  Her subsequent history is as detailed below  INTERVAL HISTORY: Jamie [name] returns today for follow-up and treatment of her HER-2/neu positive and estrogen receptor positive breast cancer. She continues on  radiation, with her last treatment due to tomorrow. She has tolerated that remarkably well. She has not had significant fatigue and continues to go to the gym 3 times a week and walk the off days.  She continues on anti-HER-2 treatment. He has no side effects from that but she is aware of. Her most recent echocardiogram 01/24/2017 showed a well-preserved ejection fraction in the 55-60% range.  She is now ready to consider anti-estrogens  REVIEW OF SYSTEMS: Jamie [name] never started the venlafaxine so she still having hot flashes. Night sweats are not a major problem. She has mild low back pain which is chronic. She feels anxious but not depressed. A detailed review of systems today was otherwise stable  PAST MEDICAL HISTORY: Past Medical History:  Diagnosis Date  . Anxiety   . Arthritis    feet  . Basal cell carcinoma   . Breast cancer ([place]) 06/2016   left breast  . Eczema   . Genetic testing 08/15/2016   Ms. Whalin underwent genetic testing for hereditary cancer syndrome through [facility]'s 43-gene Common Hereditary Cancers Panel. Ms. 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 Result report is dated 08/15/2016. Please see genetic counseling documentation from 08/17/2016 for further discussion.  [name] PONV (postoperative nausea and vomiting)     PAST SURGICAL HISTORY: Past Surgical History:  Procedure Laterality Date  . BONE SPUR  2001 AND 1988  . BREAST LUMPECTOMY WITH RADIOACTIVE SEED AND SENTINEL LYMPH NODE BIOPSY Left 07/26/2016   Procedure: BREAST LUMPECTOMY WITH RADIOACTIVE SEED AND SENTINEL LYMPH NODE BIOPSY;  Surgeon: [name], MD;  Location: [facility];  Service: General;  Laterality: Left;  . BUNIONECTOMY  10/13  . COLONOSCOPY W/ POLYPECTOMY  08/2007  . DILATION AND CURETTAGE OF  UTERUS  2002  . MOHS SURGERY  2010  . PORTACATH PLACEMENT Right 07/26/2016   Procedure: INSERTION PORT-A-CATH;   Surgeon: [name], MD;  Location: [facility];  Service: General;  Laterality: Right;    FAMILY HISTORY Family History  Problem Relation Age of Onset  . Ovarian cancer Mother 64  . Hypertension Father   . Stroke Father   . Heart disease Father   . Breast cancer Maternal Aunt 77       recurred at 64  . Heart disease Paternal Grandmother   . Osteoporosis Sister   . Liver cancer Maternal Uncle 69  The patient's father died at age 70, with some form of skin cancer, most likely melanoma. The patient's mother died at the age of 76 with ovarian cancer, which had been diagnosed a few months prior. The patient had no brothers, 1 sister. A maternal aunt was diagnosed with breast cancer at age 29, recurrent age 65.  GYNECOLOGIC HISTORY:  No LMP recorded. Patient is postmenopausal. Menarche age 72, the patient is GX P0. She stopped having periods approximately age 47. 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 Her husband [name] worked as a Dance movement psychotherapist for a [employer]. Their last name is pronounced foh-TEE-ah and they tell me it means "burning" in [place]. Their children are adopted. [name] lives in [place] and works in Press photographer, and Bixby lives in [place] and is an Scientist, water quality. The patient has 3 grandchildren. She is not a Ambulance person.    ADVANCED DIRECTIVES: In place   HEALTH MAINTENANCE: Social History  Substance Use Topics  . Smoking status: Never Smoker  . Smokeless tobacco: Never Used  . Alcohol use 0.0 oz/week     Comment: 1-2     Colonoscopy:2009  TOI:ZTIWPY 2018  Bone density:April 2016   Allergies  Allergen Reactions  . Codeine Nausea Only and Other (See Comments)    Dizzy  . Sulfamethoxazole Nausea And Vomiting  . Adhesive [Tape]     Current Outpatient Prescriptions  Medication Sig Dispense Refill  . CALCIUM PO Take 500 mg by mouth 2 (two) times daily.  Skeletal strength 368m Calium    . cetirizine (ZYRTEC) 10 MG tablet Take 10 mg by mouth daily.    . Cholecalciferol (VITAMIN D3) 1000 units CAPS Take 1 capsule by mouth daily.    . Cyanocobalamin (B-12) 2500 MCG TABS Take by mouth.    . diphenhydrAMINE (BENADRYL) 25 mg capsule Take 25 mg by mouth every 6 (six) hours as needed.    . hyaluronate sodium (RADIAPLEXRX) GEL Apply 1 application topically once.    . Lactobacillus (ACIDOPHILUS PROBIOTIC PO) Take by mouth daily.    .Marland Kitchenlidocaine-prilocaine (EMLA) cream Apply one application to port 1-2 hours prior to access. Cover with saran wrap. 30 g 3  . Melatonin 3 MG TABS Take 6 mg by mouth as needed.    . Multiple Vitamin (MULTIVITAMIN) tablet Take 1 tablet by mouth daily.    . non-metallic deodorant (Jethro Poling MISC Apply 1 application topically daily as needed.    . prochlorperazine (COMPAZINE) 10 MG tablet Take 10 mg by mouth every 6 (six) hours as needed.    . SYMBICORT 160-4.5 MCG/ACT inhaler INL 2 PFS ITL BID  1  . venlafaxine XR (EFFEXOR-XR) 75 MG 24 hr capsule Take 1 capsule (75 mg total) by mouth daily with breakfast. (Patient not taking: Reported  on 12/19/2016) 30 capsule 6   No current facility-administered medications for this visit.     OBJECTIVE: Middle-aged white woman In no acute distress  Vitals:   01/30/17 1219  BP: 103/61  Pulse: 64  Resp: 18  Temp: 98.6 F (37 C)  SpO2: 100%     Body mass index is 24.45 kg/m.   Filed Weights   01/30/17 1219  Weight: 151 lb 8 oz (68.7 kg)     ECOG FS:1 - Symptomatic but completely ambulatory  Sclerae unicteric, EOMs intact Oropharynx clear and moist No cervical or supraclavicular adenopathy Lungs no rales or rhonchi Heart regular rate and rhythm Abd soft, nontender, positive bowel sounds MSK no focal spinal tenderness, no upper extremity lymphedema Neuro: nonfocal, well oriented, appropriate affect Breasts: The left breast shows mild erythema over the port area, with some palpable  lesions, but there is no desquamation. The cosmetic result is good. The left axilla is benign.  LAB RESULTS:  CMP     Component Value Date/Time   NA 139 01/30/2017 1115   K 4.4 01/30/2017 1115   CL 106 12/22/2016 2105   CO2 26 01/30/2017 1115   GLUCOSE 90 01/30/2017 1115   BUN 10.6 01/30/2017 1115   CREATININE 0.8 01/30/2017 1115   CALCIUM 9.8 01/30/2017 1115   PROT 6.9 01/30/2017 1115   ALBUMIN 4.1 01/30/2017 1115   AST 21 01/30/2017 1115   ALT 14 01/30/2017 1115   ALKPHOS 60 01/30/2017 1115   BILITOT 0.32 01/30/2017 1115   GFRNONAA >60 12/22/2016 2105   GFRNONAA 86 08/04/2015 0831   GFRAA >60 12/22/2016 2105   GFRAA >89 08/04/2015 0831    INo results found for: SPEP, UPEP  Lab Results  Component Value Date   WBC 5.2 01/30/2017   NEUTROABS 3.5 01/30/2017   HGB 12.0 01/30/2017   HCT 35.1 01/30/2017   MCV 98.6 01/30/2017   PLT 178 01/30/2017      Chemistry      Component Value Date/Time   NA 139 01/30/2017 1115   K 4.4 01/30/2017 1115   CL 106 12/22/2016 2105   CO2 26 01/30/2017 1115   BUN 10.6 01/30/2017 1115   CREATININE 0.8 01/30/2017 1115   GLU 73 08/02/2014      Component Value Date/Time   CALCIUM 9.8 01/30/2017 1115   ALKPHOS 60 01/30/2017 1115   AST 21 01/30/2017 1115   ALT 14 01/30/2017 1115   BILITOT 0.32 01/30/2017 1115       No results found for: LABCA2  No components found for: LABCA125  No results for input(s): INR in the last 168 hours.  Urinalysis    Component Value Date/Time   COLORURINE COLORLESS (A) 12/22/2016 2105   APPEARANCEUR CLEAR 12/22/2016 2105   LABSPEC 1.005 12/22/2016 2105   PHURINE 7.0 12/22/2016 2105   GLUCOSEU NEGATIVE 12/22/2016 2105   HGBUR NEGATIVE 12/22/2016 2105   [place] NEGATIVE 12/22/2016 2105   [place] NEGATIVE 12/22/2016 2105   PROTEINUR NEGATIVE 12/22/2016 2105   NITRITE NEGATIVE 12/22/2016 2105   LEUKOCYTESUR NEGATIVE 12/22/2016 2105     STUDIES: No results found.   ELIGIBLE FOR  AVAILABLE RESEARCH PROTOCOL: no  ASSESSMENT: 61 y.o. Jamie [name], [name] woman status post biopsy of the left breast upper outer quadrant lesion 07/03/2016 showing a clinical T1b N0, stage 1B invasive ductal carcinoma, grade 2 or 3, estrogen receptor weakly positive at 5%, progesterone receptor negative, but HER-2 strongly amplified, with an MIB-1 of 50%.  (1) status post left lumpectomy with  sentinel lymph node sampling 07/26/2016 for a pT1c pN0, stage IA invasive ductal carcinoma, grade 3, with extracellular mucin, and negative margins  (2) chemotherapy consisting of Paclitaxel weekly starting 09/18/2016, discontinued after one cycle due to peripheral neuropathy.   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   (a) associated with increased colorectal cancer risk, possibly breast cancer (at least in Sephardic Jews)     PLAN: Jamie [name] Will complete her radiation treatments tomorrow. She has done remarkably well with them. However it would not surprise me if she felt a little extra fatigue in the next couple of weeks.  We discussed anti-estrogens in detail and partly because of the worrisome side effects from tamoxifen, partly because of the superior results with anastrozole when compared head-to-head with tamoxifen, and partly because  of her mutation, we are going to go with anastrozole.  She has a good understanding of the possible toxicities, side effects and complications of that agent.  She is going to start October 1. I don't think that will interfere with her mid-October trip to [place], since symptoms usually take several weeks to develop.  If she does have worsening problems with hot flashes she already has venlafaxine, although she never did start that medication. If vital dryness becomes more problems she is aware that we do have the intimacy and pelvic health program available  Otherwise she will see me again mid November. She knows to call for any problems that may develop before that visit.  [name]  01/30/2017 12:36 PM Medical Oncology and Hematology [facility] [address] [place], [place] [address] Tel. [phone]    Fax. [phone]

## 2017-01-30 NOTE — Patient Instructions (Signed)

## 2017-01-30 NOTE — Telephone Encounter (Signed)
Patient called scheduled a physical for 03/28/17 and would like to get a complete lab panel done on 03/25/17. Thanks

## 2017-01-30 NOTE — Telephone Encounter (Signed)
Routed to PCP 

## 2017-01-30 NOTE — Patient Instructions (Signed)
[facility] Discharge Instructions for Patients Receiving Chemotherapy  Today you received the following chemotherapy agents Herceptin   To help prevent nausea and vomiting after your treatment, we encourage you to take your nausea medication as prescribed by your MD If you develop nausea and vomiting that is not controlled by your nausea medication, call the clinic.   BELOW ARE SYMPTOMS THAT SHOULD BE REPORTED IMMEDIATELY:  *FEVER GREATER THAN 100.5 F  *CHILLS WITH OR WITHOUT FEVER  NAUSEA AND VOMITING THAT IS NOT CONTROLLED WITH YOUR NAUSEA MEDICATION  *UNUSUAL SHORTNESS OF BREATH  *UNUSUAL BRUISING OR BLEEDING  TENDERNESS IN MOUTH AND THROAT WITH OR WITHOUT PRESENCE OF ULCERS  *URINARY PROBLEMS  *BOWEL PROBLEMS  UNUSUAL RASH Items with * indicate a potential emergency and should be followed up as soon as possible.  Feel free to call the clinic you have any questions or concerns. The clinic phone number is [phone].  Please show the [place] at check-in to the Emergency Department and triage nurse.  Trastuzumab injection for infusion What is this medicine? TRASTUZUMAB (tras TOO zoo mab) is a monoclonal antibody. It is used to treat breast cancer and stomach cancer. This medicine may be used for other purposes; ask your health care provider or pharmacist if you have questions. COMMON BRAND NAME(S): Herceptin What should I tell my health care provider before I take this medicine? They need to know if you have any of these conditions: -heart disease -heart failure -lung or breathing disease, like asthma -an unusual or allergic reaction to trastuzumab, benzyl alcohol, or other medications, foods, dyes, or preservatives -pregnant or trying to get pregnant -breast-feeding How should I use this medicine? This drug is given as an infusion into a vein. It is administered in a hospital or clinic by a specially trained health care professional. Talk  to your pediatrician regarding the use of this medicine in children. This medicine is not approved for use in children. Overdosage: If you think you have taken too much of this medicine contact a poison control center or emergency room at once. NOTE: This medicine is only for you. Do not share this medicine with others. What if I miss a dose? It is important not to miss a dose. Call your doctor or health care professional if you are unable to keep an appointment. What may interact with this medicine? This medicine may interact with the following medications: -certain types of chemotherapy, such as daunorubicin, doxorubicin, epirubicin, and idarubicin This list may not describe all possible interactions. Give your health care provider a list of all the medicines, herbs, non-prescription drugs, or dietary supplements you use. Also tell them if you smoke, drink alcohol, or use illegal drugs. Some items may interact with your medicine. What should I watch for while using this medicine? Visit your doctor for checks on your progress. Report any side effects. Continue your course of treatment even though you feel ill unless your doctor tells you to stop. Call your doctor or health care professional for advice if you get a fever, chills or sore throat, or other symptoms of a cold or flu. Do not treat yourself. Try to avoid being around people who are sick. You may experience fever, chills and shaking during your first infusion. These effects are usually mild and can be treated with other medicines. Report any side effects during the infusion to your health care professional. Fever and chills usually do not happen with later infusions. Do not become pregnant  while taking this medicine or for 7 months after stopping it. Women should inform their doctor if they wish to become pregnant or think they might be pregnant. Women of child-bearing potential will need to have a negative pregnancy test before starting this  medicine. There is a potential for serious side effects to an unborn child. Talk to your health care professional or pharmacist for more information. Do not breast-feed an infant while taking this medicine or for 7 months after stopping it. Women must use effective birth control with this medicine. What side effects may I notice from receiving this medicine? Side effects that you should report to your doctor or health care professional as soon as possible: -allergic reactions like skin rash, itching or hives, swelling of the face, lips, or tongue -chest pain or palpitations -cough -dizziness -feeling faint or lightheaded, falls -fever -general ill feeling or flu-like symptoms -signs of worsening heart failure like breathing problems; swelling in your legs and feet -unusually weak or tired Side effects that usually do not require medical attention (report to your doctor or health care professional if they continue or are bothersome): -bone pain -changes in taste -diarrhea -joint pain -nausea/vomiting -weight loss This list may not describe all possible side effects. Call your doctor for medical advice about side effects. You may report side effects to FDA at 1-800-FDA-1088. Where should I keep my medicine? This drug is given in a hospital or clinic and will not be stored at home. NOTE: This sheet is a summary. It may not cover all possible information. If you have questions about this medicine, talk to your doctor, pharmacist, or health care provider.  2018 Elsevier/Gold Standard (2016-05-01 14:37:52)

## 2017-01-31 ENCOUNTER — Telehealth: Payer: Self-pay | Admitting: Oncology

## 2017-01-31 ENCOUNTER — Encounter: Payer: Self-pay | Admitting: Radiation Oncology

## 2017-01-31 ENCOUNTER — Encounter: Payer: Self-pay | Admitting: Oncology

## 2017-01-31 ENCOUNTER — Ambulatory Visit
Admission: RE | Admit: 2017-01-31 | Discharge: 2017-01-31 | Disposition: A | Discharge status: Home / Self Care | Payer: Managed Care, Other (non HMO) | Source: Ambulatory Visit | Attending: Radiation Oncology | Admitting: Radiation Oncology

## 2017-01-31 DIAGNOSIS — Z510 Encounter for antineoplastic radiation therapy: Secondary | ICD-10-CM | POA: Diagnosis not present

## 2017-01-31 NOTE — Telephone Encounter (Signed)
Scheduled appt per 9/12 los - patient is aware and is my chart active.

## 2017-02-04 ENCOUNTER — Encounter: Payer: Self-pay | Admitting: Oncology

## 2017-02-04 ENCOUNTER — Telehealth: Payer: Self-pay | Admitting: Oncology

## 2017-02-04 NOTE — Addendum Note (Signed)
Addended by: Maryla Morrow on: 02/04/2017 10:03 AM   Modules accepted: Orders

## 2017-02-04 NOTE — Telephone Encounter (Signed)
Left message on patient vm with information as noted below. Rhonda Cunningham,CMA

## 2017-02-04 NOTE — Progress Notes (Signed)
  Radiation Oncology         [phone] ________________________________  Name: Jamie [name] MRN: [id number]  Date: 01/31/2017  DOB: Jul 23, 1955  End of Treatment Note  Diagnosis:   61 y.o. 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     Indication for treatment:  Curative       Radiation treatment dates:   01/02/2017 to 01/31/2017  Site/dose:    1. The Left breast was treated to 42.72 Gy in 16 fractions of 2.67 Gy.  2. The Left breast was boosted to 10 Gy in 5 fractions of 2 Gy.  Beams/energy:    1. 3-D // 6X 2. electrons // 12E  Narrative: The patient tolerated radiation treatment relatively well.   She reports pain in her left nipple area. She has been applying neosporin plus pain and taking tylenol every 8 hours. Denies fatigue. Using Radiaplex as directed. The skin on her left breast is red with dermatitis. She also has some swelling under her left arm.   Plan: The patient has completed radiation treatment. The patient will return to radiation oncology clinic for routine followup in one month. I advised them to call or return sooner if they have any questions or concerns related to their recovery or treatment.  -----------------------------------  Blair Promise, PhD, MD  This document serves as a record of services personally performed by [name], MD. It was created on his behalf by Arlyce Harman, a trained medical scribe. The creation of this record is based on the scribe's personal observations and the provider's statements to them. This document has been checked and approved by the attending provider.

## 2017-02-04 NOTE — Telephone Encounter (Signed)
Patient called and wants to verify that her 1 month follow up is [place].  She though we told her to follow up in 2 weeks.

## 2017-02-04 NOTE — Telephone Encounter (Signed)
Orders are in, she should be able to go to the lab at her convenience for fasting blood work

## 2017-02-05 ENCOUNTER — Other Ambulatory Visit: Payer: Self-pay | Admitting: *Deleted

## 2017-02-05 DIAGNOSIS — Z780 Asymptomatic menopausal state: Secondary | ICD-10-CM

## 2017-02-05 DIAGNOSIS — M8580 Other specified disorders of bone density and structure, unspecified site: Secondary | ICD-10-CM

## 2017-02-05 DIAGNOSIS — C50412 Malignant neoplasm of upper-outer quadrant of left female breast: Secondary | ICD-10-CM

## 2017-02-05 DIAGNOSIS — Z79811 Long term (current) use of aromatase inhibitors: Secondary | ICD-10-CM

## 2017-02-05 DIAGNOSIS — Z170 Estrogen receptor positive status [ER+]: Principal | ICD-10-CM

## 2017-02-05 MED ORDER — ANASTROZOLE 1 MG PO TABS: 1.0000 mg | ORAL_TABLET | Freq: Every day | ORAL | 12 refills | Status: DC

## 2017-02-13 ENCOUNTER — Ambulatory Visit (INDEPENDENT_AMBULATORY_CARE_PROVIDER_SITE_OTHER): Payer: Managed Care, Other (non HMO) | Admitting: Osteopathic Medicine

## 2017-02-13 VITALS — Temp 99.0°F

## 2017-02-13 DIAGNOSIS — Z23 Encounter for immunization: Secondary | ICD-10-CM | POA: Diagnosis not present

## 2017-02-13 NOTE — Progress Notes (Addendum)
Pt here for flu shot. Afebrile,no recent illness. Vaccination given, pt tolerated well..Jamie [name]  

## 2017-02-14 ENCOUNTER — Telehealth: Payer: Self-pay | Admitting: *Deleted

## 2017-02-14 NOTE — Telephone Encounter (Signed)
On 02-14-17 fax medical records to [facility] , it was consult note, end of tx note, sim & planning note

## 2017-02-20 ENCOUNTER — Ambulatory Visit (HOSPITAL_BASED_OUTPATIENT_CLINIC_OR_DEPARTMENT_OTHER): Payer: Managed Care, Other (non HMO)

## 2017-02-20 ENCOUNTER — Ambulatory Visit: Payer: Managed Care, Other (non HMO)

## 2017-02-20 ENCOUNTER — Other Ambulatory Visit: Payer: Self-pay | Admitting: Hematology and Oncology

## 2017-02-20 ENCOUNTER — Other Ambulatory Visit (HOSPITAL_BASED_OUTPATIENT_CLINIC_OR_DEPARTMENT_OTHER): Payer: Managed Care, Other (non HMO)

## 2017-02-20 VITALS — BP 111/71 | HR 60 | Temp 98.0°F | Resp 17

## 2017-02-20 DIAGNOSIS — Z170 Estrogen receptor positive status [ER+]: Principal | ICD-10-CM

## 2017-02-20 DIAGNOSIS — C50412 Malignant neoplasm of upper-outer quadrant of left female breast: Secondary | ICD-10-CM

## 2017-02-20 DIAGNOSIS — Z5112 Encounter for antineoplastic immunotherapy: Secondary | ICD-10-CM | POA: Diagnosis not present

## 2017-02-20 DIAGNOSIS — Z95828 Presence of other vascular implants and grafts: Secondary | ICD-10-CM

## 2017-02-20 LAB — CBC WITH DIFFERENTIAL/PLATELET
BASO%: 1.2 % (ref 0.0–2.0)
Basophils Absolute: 0 10*3/uL (ref 0.0–0.1)
EOS%: 5.6 % (ref 0.0–7.0)
Eosinophils Absolute: 0.2 10*3/uL (ref 0.0–0.5)
HEMATOCRIT: 36.6 % (ref 34.8–46.6)
HGB: 12.2 g/dL (ref 11.6–15.9)
LYMPH#: 0.9 10*3/uL (ref 0.9–3.3)
LYMPH%: 29.1 % (ref 14.0–49.7)
MCH: 32.6 pg (ref 25.1–34.0)
MCHC: 33.3 g/dL (ref 31.5–36.0)
MCV: 97.9 fL (ref 79.5–101.0)
MONO#: 0.4 10*3/uL (ref 0.1–0.9)
MONO%: 12.1 % (ref 0.0–14.0)
NEUT%: 52 % (ref 38.4–76.8)
NEUTROS ABS: 1.7 10*3/uL (ref 1.5–6.5)
PLATELETS: 199 10*3/uL (ref 145–400)
RBC: 3.74 10*6/uL (ref 3.70–5.45)
RDW: 12.5 % (ref 11.2–14.5)
WBC: 3.2 10*3/uL — ABNORMAL LOW (ref 3.9–10.3)

## 2017-02-20 LAB — COMPREHENSIVE METABOLIC PANEL
ALBUMIN: 4 g/dL (ref 3.5–5.0)
ALK PHOS: 60 U/L (ref 40–150)
ALT: 14 U/L (ref 0–55)
AST: 21 U/L (ref 5–34)
Anion Gap: 8 mEq/L (ref 3–11)
BILIRUBIN TOTAL: 0.23 mg/dL (ref 0.20–1.20)
BUN: 9.4 mg/dL (ref 7.0–26.0)
CALCIUM: 9.8 mg/dL (ref 8.4–10.4)
CO2: 26 mEq/L (ref 22–29)
Chloride: 106 mEq/L (ref 98–109)
Creatinine: 0.8 mg/dL (ref 0.6–1.1)
EGFR: 80 mL/min/{1.73_m2} — AB (ref >90)
GLUCOSE: 85 mg/dL (ref 70–140)
Potassium: 4.5 mEq/L (ref 3.5–5.1)
SODIUM: 140 meq/L (ref 136–145)
TOTAL PROTEIN: 7.2 g/dL (ref 6.4–8.3)

## 2017-02-20 MED ORDER — HEPARIN SOD (PORK) LOCK FLUSH 100 UNIT/ML IV SOLN
500.0000 [IU] | Freq: Once | INTRAVENOUS | Status: AC | PRN
  Administered 2017-02-20: 500 [IU]
  Filled 2017-02-20: qty 5

## 2017-02-20 MED ORDER — SODIUM CHLORIDE 0.9 % IV SOLN
Freq: Once | INTRAVENOUS | Status: AC
  Administered 2017-02-20: 12:00:00 via INTRAVENOUS

## 2017-02-20 MED ORDER — ACETAMINOPHEN 325 MG PO TABS
ORAL_TABLET | ORAL | Status: AC
  Filled 2017-02-20: qty 2

## 2017-02-20 MED ORDER — DIPHENHYDRAMINE HCL 25 MG PO CAPS
ORAL_CAPSULE | ORAL | Status: AC
  Filled 2017-02-20: qty 1

## 2017-02-20 MED ORDER — DIPHENHYDRAMINE HCL 25 MG PO CAPS
25.0000 mg | ORAL_CAPSULE | Freq: Once | ORAL | Status: AC
  Administered 2017-02-20: 25 mg via ORAL

## 2017-02-20 MED ORDER — ACETAMINOPHEN 325 MG PO TABS
650.0000 mg | ORAL_TABLET | Freq: Once | ORAL | Status: AC
  Administered 2017-02-20: 650 mg via ORAL

## 2017-02-20 MED ORDER — TRASTUZUMAB CHEMO 150 MG IV SOLR
6.0000 mg/kg | Freq: Once | INTRAVENOUS | Status: AC
  Administered 2017-02-20: 399 mg via INTRAVENOUS
  Filled 2017-02-20: qty 19

## 2017-02-20 MED ORDER — SODIUM CHLORIDE 0.9% FLUSH
10.0000 mL | Freq: Once | INTRAVENOUS | Status: AC
  Administered 2017-02-20: 10 mL
  Filled 2017-02-20: qty 10

## 2017-02-20 MED ORDER — SODIUM CHLORIDE 0.9% FLUSH
10.0000 mL | INTRAVENOUS | Status: DC | PRN
  Administered 2017-02-20: 10 mL
  Filled 2017-02-20: qty 10

## 2017-02-20 NOTE — Patient Instructions (Signed)
[facility] Discharge Instructions for Patients Receiving Chemotherapy  Today you received the following chemotherapy agents Herceptin  To help prevent nausea and vomiting after your treatment, we encourage you to take your nausea medication as directed   If you develop nausea and vomiting that is not controlled by your nausea medication, call the clinic.   BELOW ARE SYMPTOMS THAT SHOULD BE REPORTED IMMEDIATELY:  *FEVER GREATER THAN 100.5 F  *CHILLS WITH OR WITHOUT FEVER  NAUSEA AND VOMITING THAT IS NOT CONTROLLED WITH YOUR NAUSEA MEDICATION  *UNUSUAL SHORTNESS OF BREATH  *UNUSUAL BRUISING OR BLEEDING  TENDERNESS IN MOUTH AND THROAT WITH OR WITHOUT PRESENCE OF ULCERS  *URINARY PROBLEMS  *BOWEL PROBLEMS  UNUSUAL RASH Items with * indicate a potential emergency and should be followed up as soon as possible.  Feel free to call the clinic should you have any questions or concerns. The clinic phone number is [phone].  Please show the CHEMO ALERT CARD at check-in to the Emergency Department and triage nurse.   

## 2017-02-26 ENCOUNTER — Ambulatory Visit
Admission: RE | Admit: 2017-02-26 | Discharge: 2017-02-26 | Disposition: A | Discharge status: Home / Self Care | Payer: Managed Care, Other (non HMO) | Source: Ambulatory Visit | Attending: Oncology | Admitting: Oncology

## 2017-02-26 DIAGNOSIS — Z170 Estrogen receptor positive status [ER+]: Principal | ICD-10-CM

## 2017-02-26 DIAGNOSIS — M8580 Other specified disorders of bone density and structure, unspecified site: Secondary | ICD-10-CM

## 2017-02-26 DIAGNOSIS — Z79811 Long term (current) use of aromatase inhibitors: Secondary | ICD-10-CM

## 2017-02-26 DIAGNOSIS — Z780 Asymptomatic menopausal state: Secondary | ICD-10-CM

## 2017-02-26 DIAGNOSIS — C50412 Malignant neoplasm of upper-outer quadrant of left female breast: Secondary | ICD-10-CM

## 2017-02-28 ENCOUNTER — Ambulatory Visit (INDEPENDENT_AMBULATORY_CARE_PROVIDER_SITE_OTHER): Payer: Managed Care, Other (non HMO) | Admitting: Gastroenterology

## 2017-02-28 ENCOUNTER — Encounter: Payer: Self-pay | Admitting: Gastroenterology

## 2017-02-28 VITALS — BP 96/62 | HR 68 | Ht 64.75 in | Wt 151.2 lb

## 2017-02-28 DIAGNOSIS — Z1211 Encounter for screening for malignant neoplasm of colon: Secondary | ICD-10-CM

## 2017-02-28 DIAGNOSIS — R898 Other abnormal findings in specimens from other organs, systems and tissues: Secondary | ICD-10-CM

## 2017-02-28 MED ORDER — SUPREP BOWEL PREP KIT 17.5-3.13-1.6 GM/177ML PO SOLN: ORAL | 0 refills | Status: DC

## 2017-02-28 NOTE — Patient Instructions (Signed)
You have been scheduled for an endoscopy and colonoscopy. Please follow the written instructions given to you at your visit today. Please pick up your prep supplies at the pharmacy within the next 1-3 days. If you use inhalers (even only as needed), please bring them with you on the day of your procedure. Your physician has requested that you go to www.startemmi.com and enter the access code given to you at your visit today. This web site gives a general overview about your procedure. However, you should still follow specific instructions given to you by our office regarding your preparation for the procedure.  If you are age 28 or older, your body mass index should be between 23-30. Your Body mass index is 25.36 kg/m. If this is out of the aforementioned range listed, please consider follow up with your Primary Care Provider.  If you are age 40 or younger, your body mass index should be between 19-25. Your Body mass index is 25.36 kg/m. If this is out of the aformentioned range listed, please consider follow up with your Primary Care Provider.   Thank you.

## 2017-02-28 NOTE — Progress Notes (Signed)
HPI :  61 y/o female with a history of breast cancer, anxiety, abnormal genetic testing, here for a new patient referral to discuss having a colonoscopy, referred by Dr. Emeterio Reeve. 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 On trastuzumab 08/07/16 for one year. Going to start anastrozole on October 1st.    No FH of colon cancer or gastric cancer. No blood in the stools. No trouble with her bowels. No eating trouble, no nausea or vomiting at this time.  No prior EGDs. She generally feels well at this time without complaints. Her last colonoscopy was in 2009, without adenomas.    Colonoscopy 09/11/2007 - hyperplastic polyp of descending colon      Past Medical History:  Diagnosis Date  . Anxiety   . Arthritis    feet  . Basal cell carcinoma   . Breast cancer ([place]) 06/2016   left breast  . Colon polyps   . Eczema   . Genetic testing 08/15/2016   Ms. Powell underwent genetic testing for hereditary cancer syndrome through [facility]'s 43-gene Common Hereditary Cancers Panel. Ms. 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 Result report is dated 08/15/2016. Please see genetic counseling documentation from 08/17/2016 for further discussion.  [name] PONV (postoperative nausea and vomiting)      Past Surgical History:  Procedure Laterality Date  . BONE SPUR Bilateral 2001 AND 1988  . BREAST LUMPECTOMY WITH RADIOACTIVE SEED AND SENTINEL LYMPH NODE BIOPSY Left 07/26/2016   Procedure: BREAST LUMPECTOMY WITH RADIOACTIVE SEED AND SENTINEL LYMPH NODE BIOPSY;  Surgeon: [name], MD;  Location: [facility];  Service: General;  Laterality: Left;  . BUNIONECTOMY Bilateral  02/2012  . COLONOSCOPY W/ POLYPECTOMY  08/2007  . DILATION AND CURETTAGE OF UTERUS  2002  . MOHS SURGERY  2010  . PORTACATH PLACEMENT Right 07/26/2016   Procedure: INSERTION PORT-A-CATH;  Surgeon: [name], MD;  Location: [facility];  Service: General;  Laterality: Right;   Family History  Problem Relation Age of Onset  . Ovarian cancer Mother 88  . Hypertension Father   . Stroke Father   . Heart disease Father   . Breast cancer Maternal Aunt 77       recurred at 60  . Heart disease Paternal Grandmother   . Osteoporosis Sister   . Liver cancer Maternal Uncle 77   Social History  Substance Use Topics  . Smoking status: Never Smoker  . Smokeless tobacco: Never Used  . Alcohol use 0.0 oz/week     Comment: 1-2   Current Outpatient Prescriptions  Medication Sig Dispense Refill  . anastrozole (ARIMIDEX) 1 MG tablet Take 1 tablet (1 mg total) by mouth daily. 30 tablet 12  . CALCIUM PO Take 500 mg by mouth 2 (two) times daily. Skeletal strength 374m Calium    . cetirizine (ZYRTEC) 10 MG tablet Take 10 mg by mouth daily.    . Cholecalciferol (VITAMIN D3) 1000 units CAPS Take 1 capsule by mouth daily.    . Cyanocobalamin (B-12) 2500 MCG TABS Take 1 tablet by mouth daily.     . diphenhydrAMINE (BENADRYL) 25 mg capsule Take 25 mg by mouth every 6 (six) hours as needed.    .[name]  hyaluronate sodium (RADIAPLEXRX) GEL Apply 1 application topically once.    . Lactobacillus (ACIDOPHILUS PROBIOTIC PO) Take by mouth daily.    [name] lidocaine-prilocaine (EMLA) cream Apply one application to port 1-2 hours prior to access. Cover with saran wrap. 30 g 3  . Multiple Vitamin (MULTIVITAMIN) tablet Take 1 tablet by mouth daily.    . non-metallic deodorant Jethro Poling) MISC Apply 1 application topically daily as needed.    . SYMBICORT 160-4.5 MCG/ACT inhaler INL 2 PFS ITL BID  1  . SUPREP BOWEL PREP KIT 17.5-3.13-1.6 GM/180ML SOLN Suprep-Use as directed 354 mL 0   No current facility-administered  medications for this visit.    Allergies  Allergen Reactions  . Codeine Nausea Only and Other (See Comments)    Dizzy  . Sulfamethoxazole Nausea And Vomiting  . Adhesive [Tape]      Review of Systems: All systems reviewed and negative except where noted in HPI.    Dg Bone Density  Result Date: 02/26/2017 EXAM: DUAL X-RAY ABSORPTIOMETRY (DXA) FOR BONE MINERAL DENSITY IMPRESSION: Referring Physician:  [name] PATIENT: Name: Anagha, Loseke Patient ID: [id number] Birth Date: Sep 06, 1955 Height: 66.0 in. Sex: Female Measured: 02/26/2017 Weight: 154.2 lbs. Indications: Anastrazole, Breast Cancer History, Caucasian, Estrogen Deficient, Postmenopausal Fractures: None Treatments: Calcium (E943.0), Hormone Therapy For Cancer, Vitamin D (E933.5) ASSESSMENT: The BMD measured at Femur Neck Left is 0.764 g/cm2 with a T-score of -2.0. This patient is considered osteopenic according to [facility]) criteria. Site Region Measured Date Measured Age YA BMD Significant CHANGE T-score DualFemur Neck Left 02/26/2017    62.1         -2.0    0.764 g/cm2 AP Spine  L1-L4     02/26/2017    62.1         -1.5    0.997 g/cm2 World Health Organization [facility]) criteria for post-menopausal, Caucasian Women: Normal       T-score at or above -1 SD Osteopenia   T-score between -1 and -2.5 SD Osteoporosis T-score at or below -2.5 SD RECOMMENDATION: [place] recommends that FDA-approved medical therapies be considered in postmenopausal women and men age 47 or older with a: 1. Hip or vertebral (clinical or morphometric) fracture. 2. T-score of <-2.5 at the spine or hip. 3. Ten-year fracture probability by FRAX of 3% or greater for hip fracture or 20% or greater for major osteoporotic fracture. All treatment decisions require clinical judgment and consideration of individual patient factors, including patient preferences, co-morbidities, previous drug use, risk factors not captured in the  FRAX model (e.g. falls, vitamin D deficiency, increased bone turnover, interval significant decline in bone density) and possible under - or over-estimation of fracture risk by FRAX. All patients should ensure an adequate intake of dietary calcium (1200 mg/d) and vitamin D (800 IU daily) unless contraindicated. FOLLOW-UP: People with diagnosed cases of osteoporosis or at high risk for fracture should have regular bone mineral density tests. For patients eligible for Medicare, routine testing is allowed once every 2 years. The testing frequency can be increased to one year for patients who have rapidly progressing disease, those who are receiving or discontinuing medical therapy to restore bone mass, or have additional risk factors. I have reviewed this report, and agree with the above findings. [facility] FRAX* 10-year Probability of Fracture Based on femoral neck BMD: DualFemur (Left) Major Osteoporotic Fracture: 10.2% Hip Fracture:                1.4%  Population:                  [place] (Caucasian) Risk Factors:                None *FRAX is a Materials engineer of the [employer] for Metabolic Bone Disease, a [place] (WHO) [facility]. ASSESSMENT: The probability of a major osteoporotic fracture is 10.2 % within the next ten years. The probability of a hip fracture is 1.4 % within the next ten years. Electronically Signed   By: Marijo Conception, M.D.   On: 02/26/2017 15:17   Lab Results  Component Value Date   WBC 3.2 (L) 02/20/2017   HGB 12.2 02/20/2017   HCT 36.6 02/20/2017   MCV 97.9 02/20/2017   PLT 199 02/20/2017    Lab Results  Component Value Date   CREATININE 0.8 02/20/2017   BUN 9.4 02/20/2017   NA 140 02/20/2017   K 4.5 02/20/2017   CL 106 12/22/2016   CO2 26 02/20/2017    Lab Results  Component Value Date   ALT 14 02/20/2017   AST 21 02/20/2017   ALKPHOS 60 02/20/2017   BILITOT 0.23 02/20/2017     Physical  Exam: BP 96/62 (BP Location: Right Arm, Patient Position: Sitting, Cuff Size: Normal)   Pulse 68   Ht 5' 4.75" (1.645 m) Comment: height measured without shoes  Wt 151 lb 4 oz (68.6 kg)   BMI 25.36 kg/m  Constitutional: Pleasant,well-developed, female in no acute distress. HEENT: Normocephalic and atraumatic. Conjunctivae are normal. No scleral icterus. Neck supple.  Cardiovascular: Normal rate, regular rhythm.  Pulmonary/chest: Effort normal and breath sounds normal. No wheezing, rales or rhonchi. Abdominal: Soft, nondistended, nontender.  There are no masses palpable. No hepatomegaly. Extremities: no edema Lymphadenopathy: No cervical adenopathy noted. Neurological: Alert and oriented to person place and time. Skin: Skin is warm and dry. No rashes noted. Psychiatric: Normal mood and affect. Behavior is normal.   ASSESSMENT AND PLAN: 61 year old female with a history of breast cancer and abnormal genetic testing with a single (monoallelic) mutation for MUTYH, here to discuss colon cancer screening and other screening tests for malignancy given her genetic testing results.  Fortunately she has only a monoallelic mutation of MUTYH, although there is no clear consensus for screening in this situation. For bi-allelic mutations, colonoscopy is recommended every 5 years per NCCN guidelines if the patient has a personal or family history of colon cancer. Further, patients with bi-allelic mutations may be considered for upper endoscopy to screen for gastric cancer / duodenal polyps, as well as thyroid cancer. We discussed this for a bit. Given her last colonoscopy is almost 10 years ago, I think a colonoscopy at this time is reasonable for screening purposes.  We will discuss the timing of how frequently she needs exams based on this result. She is otherwise never had a prior upper endoscopy nor any other risk factors for stomach cancer, but I offered her an EGD. Following discussion of this she  wanted to proceed with both EGD and colonoscopy. If she doesn't have any significant colon polyps and her EGD is normal, she likely doesn't warrant further screening upper endoscopies.   She agreed with the plan as outlined.   [place] Cellar, MD [facility] Pager [phone]  CC: Emeterio Reeve, DO

## 2017-03-08 ENCOUNTER — Encounter: Payer: Self-pay | Admitting: Oncology

## 2017-03-11 ENCOUNTER — Ambulatory Visit
Admission: RE | Admit: 2017-03-11 | Discharge: 2017-03-11 | Disposition: A | Discharge status: Home / Self Care | Payer: Managed Care, Other (non HMO) | Source: Ambulatory Visit | Attending: Radiation Oncology | Admitting: Radiation Oncology

## 2017-03-11 ENCOUNTER — Encounter: Payer: Self-pay | Admitting: Radiation Oncology

## 2017-03-11 VITALS — BP 102/63 | HR 72 | Temp 98.4°F | Wt 149.8 lb

## 2017-03-11 DIAGNOSIS — C50412 Malignant neoplasm of upper-outer quadrant of left female breast: Secondary | ICD-10-CM

## 2017-03-11 DIAGNOSIS — Z510 Encounter for antineoplastic radiation therapy: Secondary | ICD-10-CM | POA: Diagnosis not present

## 2017-03-11 DIAGNOSIS — Z170 Estrogen receptor positive status [ER+]: Principal | ICD-10-CM

## 2017-03-11 NOTE — Progress Notes (Signed)
Radiation Oncology         [phone] ________________________________  Name: Jamie [name] Jamie [name] [name] MRN: [id number]  Date: 03/11/2017  DOB: 05-25-1955    Follow-Up Visit Note  CC: Jamie [name] Reeve, DO  [name], [name], MD    ICD-10-CM   1. Malignant neoplasm of upper-outer quadrant of left breast in female, estrogen receptor positive ([place]) C50.412    Z17.0     Diagnosis:  61 y.o. 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   Interval Since Last Radiation:  1 months  01/02/2017-01/31/2017: Left breast was treated to 42.72 Gy in 16 fractions and boosted to 10 Gy in 5 fractions.  Narrative:  The patient returns today for routine follow-up. She is doing well overall. Patient reports some breast tenderness, more so at the end of the day. She denies having fatigue or skin irritation. States she stopped using Radiaplex cream for about 1 week. States she uses neosporin around the nipple area as needed. She is currently receiving Herceptin. She denies any arm swelling. She is scheduled to follow up with Dr. Jana [name] on 04/03/2017.           ALLERGIES:  is allergic to codeine; sulfamethoxazole; and adhesive [tape].  Meds: Current Outpatient Prescriptions  Medication Sig Dispense Refill  . anastrozole (ARIMIDEX) 1 MG tablet Take 1 tablet (1 mg total) by mouth daily. 30 tablet 12  . CALCIUM PO Take 500 mg by mouth 2 (two) times daily. Skeletal strength 333m Calium    . cetirizine (ZYRTEC) 10 MG tablet Take 10 mg by mouth daily.    . Cholecalciferol (VITAMIN D3) 1000 units CAPS Take 1 capsule by mouth daily.    . Cyanocobalamin (B-12) 2500 MCG TABS Take 1 tablet by mouth daily.     . diphenhydrAMINE (BENADRYL) 25 mg capsule Take 25 mg by mouth every 6 (six) hours as needed.    . Lactobacillus (ACIDOPHILUS PROBIOTIC PO) Take by mouth daily.    .Marland Kitchenlidocaine-prilocaine (EMLA) cream Apply one application to  port 1-2 hours prior to access. Cover with saran wrap. 30 g 3  . Multiple Vitamin (MULTIVITAMIN) tablet Take 1 tablet by mouth daily.    . hyaluronate sodium (RADIAPLEXRX) GEL Apply 1 application topically once.    . non-metallic deodorant (Jamie [name] Jamie [name] [name] Apply 1 application topically daily as needed.    .[place] GunningBOWEL PREP KIT 17.5-3.13-1.6 GM/180ML SOLN Suprep-Use as directed (Patient not taking: Reported on 03/11/2017) 354 mL 0  . SYMBICORT 160-4.5 MCG/ACT inhaler INL 2 PFS ITL BID  1   No current facility-administered medications for this encounter.     Physical Findings: The patient is in no acute distress. Patient is alert and oriented.  weight is 149 lb 12.8 oz (67.9 kg). Her oral temperature is 98.4 F (36.9 C). Her blood pressure is 102/63 and her pulse is 72. Her oxygen saturation is 100%. .   Lungs are clear to auscultation bilaterally. Heart has regular rate and rhythm. No palpable cervical, supraclavicular, or axillary adenopathy. Abdomen soft, non-tender, normal bowel sounds. Left breast skin is well healed with minimal hyperpigmentation changes. No dominant mass, nipple discharge or bleeding.  Lab Findings: Lab Results  Component Value Date   WBC 3.2 (L) 02/20/2017   HGB 12.2 02/20/2017   HCT 36.6 02/20/2017   MCV 97.9 02/20/2017   PLT 199 02/20/2017    Radiographic Findings: Dg Bone Density  Result Date: 02/26/2017 EXAM: DUAL X-RAY  ABSORPTIOMETRY (DXA) FOR BONE MINERAL DENSITY IMPRESSION: Referring Physician:  Chauncey [name] PATIENT: Name: Jamie [name], Jamie [name] [name] Patient ID: [id number] Birth Date: [birth year] Height: 66.0 in. Sex: Female Measured: 02/26/2017 Weight: 154.2 lbs. Indications: Anastrazole, Breast Cancer History, Caucasian, Estrogen Deficient, Postmenopausal Fractures: None Treatments: Calcium (E943.0), Hormone Therapy For Cancer, Vitamin D (E933.5) ASSESSMENT: The BMD measured at Femur Neck Left is 0.764 g/cm2 with a T-score of -2.0. This patient is considered  osteopenic according to [facility]) criteria. Site Region Measured Date Measured Age YA BMD Significant CHANGE T-score DualFemur Neck Left 02/26/2017    62.1         -2.0    0.764 g/cm2 AP Spine  L1-L4     02/26/2017    62.1         -1.6    0.997 g/cm2 World Health Organization [facility]) criteria for post-menopausal, Caucasian Women: Normal       T-score at or above -1 SD Osteopenia   T-score between -1 and -2.5 SD Osteoporosis T-score at or below -2.5 SD RECOMMENDATION: [place] recommends that FDA-approved medical therapies be considered in postmenopausal women and men age 41 or older with a: 1. Hip or vertebral (clinical or morphometric) fracture. 2. T-score of <-2.5 at the spine or hip. 3. Ten-year fracture probability by FRAX of 3% or greater for hip fracture or 20% or greater for major osteoporotic fracture. All treatment decisions require clinical judgment and consideration of individual patient factors, including patient preferences, co-morbidities, previous drug use, risk factors not captured in the FRAX model (e.g. falls, vitamin D deficiency, increased bone turnover, interval significant decline in bone density) and possible under - or over-estimation of fracture risk by FRAX. All patients should ensure an adequate intake of dietary calcium (1200 mg/d) and vitamin D (800 IU daily) unless contraindicated. FOLLOW-UP: People with diagnosed cases of osteoporosis or at high risk for fracture should have regular bone mineral density tests. For patients eligible for Medicare, routine testing is allowed once every 2 years. The testing frequency can be increased to one year for patients who have rapidly progressing disease, those who are receiving or discontinuing medical therapy to restore bone mass, or have additional risk factors. I have reviewed this report, and agree with the above findings. [facility] FRAX* 10-year Probability of Fracture Based on femoral  neck BMD: DualFemur (Left) Major Osteoporotic Fracture: 10.2% Hip Fracture:                1.4% Population:                  [place] (Caucasian) Risk Factors:                None *FRAX is a Materials engineer of the [employer] for Metabolic Bone Disease, a [place] [employer] (WHO) [facility]. ASSESSMENT: The probability of a major osteoporotic fracture is 10.2 % within the next ten years. The probability of a hip fracture is 1.4 % within the next ten years. Electronically Signed   By: Marijo Conception, M.D.   On: 02/26/2017 15:17    Impression:  The patient is recovering from the effects of radiation.  No evidence of recurrence on clinical exam today  Plan:  PRN follow up in radiation oncology. She will continue to follow closely with medical oncology. She will continue on hormonal therapy and Herceptin.  -----------------------------------  Blair Promise, PhD, MD  This document serves as a record of services  personally performed by [name], MD. It was created on his behalf by Arlyce Harman, a trained medical scribe. The creation of this record is based on the scribe's personal observations and the provider's statements to them. This document has been checked and approved by the attending provider.

## 2017-03-11 NOTE — Progress Notes (Signed)
[name] is here for breast follow-up. Pt states that she is having some breast tenderness. Pt denies having fatigue. Pt denies any skin irritation. Pt stopped using radiaplex cream for about 1 weeks. Pt states she uses neosporin around the nipple area as needed.  BP 102/63   Pulse 72   Temp 98.4 F (36.9 C) (Oral)   Wt 149 lb 12.8 oz (67.9 kg)   SpO2 100%   BMI 25.12 kg/m   Wt Readings from Last 3 Encounters:  03/11/17 149 lb 12.8 oz (67.9 kg)  02/28/17 151 lb 4 oz (68.6 kg)  01/30/17 151 lb 8 oz (68.7 kg)

## 2017-03-13 ENCOUNTER — Ambulatory Visit (HOSPITAL_BASED_OUTPATIENT_CLINIC_OR_DEPARTMENT_OTHER): Payer: Managed Care, Other (non HMO)

## 2017-03-13 ENCOUNTER — Other Ambulatory Visit (HOSPITAL_BASED_OUTPATIENT_CLINIC_OR_DEPARTMENT_OTHER): Payer: Managed Care, Other (non HMO)

## 2017-03-13 DIAGNOSIS — C50412 Malignant neoplasm of upper-outer quadrant of left female breast: Secondary | ICD-10-CM | POA: Diagnosis not present

## 2017-03-13 DIAGNOSIS — Z170 Estrogen receptor positive status [ER+]: Principal | ICD-10-CM

## 2017-03-13 DIAGNOSIS — Z5112 Encounter for antineoplastic immunotherapy: Secondary | ICD-10-CM

## 2017-03-13 LAB — COMPREHENSIVE METABOLIC PANEL
ALT: 15 U/L (ref 0–55)
ANION GAP: 7 meq/L (ref 3–11)
AST: 20 U/L (ref 5–34)
Albumin: 3.8 g/dL (ref 3.5–5.0)
Alkaline Phosphatase: 58 U/L (ref 40–150)
BUN: 10.3 mg/dL (ref 7.0–26.0)
CHLORIDE: 106 meq/L (ref 98–109)
CO2: 25 meq/L (ref 22–29)
Calcium: 9.4 mg/dL (ref 8.4–10.4)
Creatinine: 0.8 mg/dL (ref 0.6–1.1)
GLUCOSE: 84 mg/dL (ref 70–140)
Potassium: 4.4 mEq/L (ref 3.5–5.1)
SODIUM: 139 meq/L (ref 136–145)
Total Protein: 6.9 g/dL (ref 6.4–8.3)

## 2017-03-13 LAB — CBC WITH DIFFERENTIAL/PLATELET
BASO%: 1 % (ref 0.0–2.0)
BASOS ABS: 0 10*3/uL (ref 0.0–0.1)
EOS%: 8 % — AB (ref 0.0–7.0)
Eosinophils Absolute: 0.3 10*3/uL (ref 0.0–0.5)
HEMATOCRIT: 35.9 % (ref 34.8–46.6)
HEMOGLOBIN: 12.4 g/dL (ref 11.6–15.9)
LYMPH#: 1 10*3/uL (ref 0.9–3.3)
LYMPH%: 29.5 % (ref 14.0–49.7)
MCH: 32.4 pg (ref 25.1–34.0)
MCHC: 34.5 g/dL (ref 31.5–36.0)
MCV: 93.9 fL (ref 79.5–101.0)
MONO#: 0.4 10*3/uL (ref 0.1–0.9)
MONO%: 12.4 % (ref 0.0–14.0)
NEUT%: 49.1 % (ref 38.4–76.8)
NEUTROS ABS: 1.6 10*3/uL (ref 1.5–6.5)
Platelets: 244 10*3/uL (ref 145–400)
RBC: 3.83 10*6/uL (ref 3.70–5.45)
RDW: 12.9 % (ref 11.2–14.5)
WBC: 3.4 10*3/uL — AB (ref 3.9–10.3)

## 2017-03-13 MED ORDER — SODIUM CHLORIDE 0.9% FLUSH
10.0000 mL | INTRAVENOUS | Status: DC | PRN
  Administered 2017-03-13: 10 mL
  Filled 2017-03-13: qty 10

## 2017-03-13 MED ORDER — DIPHENHYDRAMINE HCL 25 MG PO CAPS
25.0000 mg | ORAL_CAPSULE | Freq: Once | ORAL | Status: AC
  Administered 2017-03-13: 25 mg via ORAL

## 2017-03-13 MED ORDER — TRASTUZUMAB CHEMO 150 MG IV SOLR
6.0000 mg/kg | Freq: Once | INTRAVENOUS | Status: AC
  Administered 2017-03-13: 399 mg via INTRAVENOUS
  Filled 2017-03-13: qty 19

## 2017-03-13 MED ORDER — ACETAMINOPHEN 325 MG PO TABS
ORAL_TABLET | ORAL | Status: AC
  Filled 2017-03-13: qty 2

## 2017-03-13 MED ORDER — ACETAMINOPHEN 325 MG PO TABS
650.0000 mg | ORAL_TABLET | Freq: Once | ORAL | Status: AC
  Administered 2017-03-13: 650 mg via ORAL

## 2017-03-13 MED ORDER — SODIUM CHLORIDE 0.9 % IV SOLN
Freq: Once | INTRAVENOUS | Status: AC
  Administered 2017-03-13: 10:00:00 via INTRAVENOUS

## 2017-03-13 MED ORDER — DIPHENHYDRAMINE HCL 25 MG PO CAPS
ORAL_CAPSULE | ORAL | Status: AC
  Filled 2017-03-13: qty 1

## 2017-03-13 MED ORDER — HEPARIN SOD (PORK) LOCK FLUSH 100 UNIT/ML IV SOLN
500.0000 [IU] | Freq: Once | INTRAVENOUS | Status: AC | PRN
  Administered 2017-03-13: 500 [IU]
  Filled 2017-03-13: qty 5

## 2017-03-13 NOTE — Patient Instructions (Signed)
[facility] Discharge Instructions for Patients Receiving Chemotherapy  Today you received the following chemotherapy agents Herceptin  To help prevent nausea and vomiting after your treatment, we encourage you to take your nausea medication as needed   If you develop nausea and vomiting that is not controlled by your nausea medication, call the clinic.   BELOW ARE SYMPTOMS THAT SHOULD BE REPORTED IMMEDIATELY:  *FEVER GREATER THAN 100.5 F  *CHILLS WITH OR WITHOUT FEVER  NAUSEA AND VOMITING THAT IS NOT CONTROLLED WITH YOUR NAUSEA MEDICATION  *UNUSUAL SHORTNESS OF BREATH  *UNUSUAL BRUISING OR BLEEDING  TENDERNESS IN MOUTH AND THROAT WITH OR WITHOUT PRESENCE OF ULCERS  *URINARY PROBLEMS  *BOWEL PROBLEMS  UNUSUAL RASH Items with * indicate a potential emergency and should be followed up as soon as possible.  Feel free to call the clinic should you have any questions or concerns. The clinic phone number is [phone].  Please show the [place] at check-in to the Emergency Department and triage nurse.

## 2017-03-26 LAB — CBC
HEMATOCRIT: 36.9 % (ref 35.0–45.0)
HEMOGLOBIN: 12.6 g/dL (ref 11.7–15.5)
MCH: 31.3 pg (ref 27.0–33.0)
MCHC: 34.1 g/dL (ref 32.0–36.0)
MCV: 91.8 fL (ref 80.0–100.0)
MPV: 9.2 fL (ref 7.5–12.5)
Platelets: 277 10*3/uL (ref 140–400)
RBC: 4.02 10*6/uL (ref 3.80–5.10)
RDW: 11.7 % (ref 11.0–15.0)
WBC: 4.5 10*3/uL (ref 3.8–10.8)

## 2017-03-26 LAB — COMPLETE METABOLIC PANEL WITH GFR
AG RATIO: 1.7 (calc) (ref 1.0–2.5)
ALBUMIN MSPROF: 4.2 g/dL (ref 3.6–5.1)
ALKALINE PHOSPHATASE (APISO): 56 U/L (ref 33–130)
ALT: 12 U/L (ref 6–29)
AST: 17 U/L (ref 10–35)
BILIRUBIN TOTAL: 0.3 mg/dL (ref 0.2–1.2)
BUN: 13 mg/dL (ref 7–25)
CO2: 29 mmol/L (ref 20–32)
Calcium: 9.5 mg/dL (ref 8.6–10.4)
Chloride: 104 mmol/L (ref 98–110)
Creat: 0.86 mg/dL (ref 0.50–0.99)
GFR, Est African American: 84 mL/min/{1.73_m2} (ref >=60)
GFR, Est Non African American: 72 mL/min/{1.73_m2} (ref >=60)
GLOBULIN: 2.5 g/dL (ref 1.9–3.7)
Glucose, Bld: 92 mg/dL (ref 65–99)
POTASSIUM: 4.2 mmol/L (ref 3.5–5.3)
SODIUM: 140 mmol/L (ref 135–146)
Total Protein: 6.7 g/dL (ref 6.1–8.1)

## 2017-03-26 LAB — LIPID PANEL
CHOL/HDL RATIO: 3.7 (calc) (ref <5.0)
CHOLESTEROL: 234 mg/dL — AB (ref <200)
HDL: 63 mg/dL (ref >50)
LDL Cholesterol (Calc): 144 mg/dL (calc) — ABNORMAL HIGH
Non-HDL Cholesterol (Calc): 171 mg/dL (calc) — ABNORMAL HIGH (ref <130)
Triglycerides: 138 mg/dL (ref <150)

## 2017-03-26 LAB — TSH: TSH: 4.96 mIU/L — ABNORMAL HIGH (ref 0.40–4.50)

## 2017-03-26 LAB — VITAMIN D 25 HYDROXY (VIT D DEFICIENCY, FRACTURES): Vit D, 25-Hydroxy: 44 ng/mL (ref 30–100)

## 2017-03-28 ENCOUNTER — Encounter: Payer: Self-pay | Admitting: Osteopathic Medicine

## 2017-03-28 ENCOUNTER — Ambulatory Visit (INDEPENDENT_AMBULATORY_CARE_PROVIDER_SITE_OTHER): Payer: Managed Care, Other (non HMO) | Admitting: Osteopathic Medicine

## 2017-03-28 VITALS — BP 121/81 | HR 69 | Temp 98.0°F | Resp 16 | Wt 149.8 lb

## 2017-03-28 DIAGNOSIS — M8580 Other specified disorders of bone density and structure, unspecified site: Secondary | ICD-10-CM

## 2017-03-28 DIAGNOSIS — Z0000 Encounter for general adult medical examination without abnormal findings: Secondary | ICD-10-CM

## 2017-03-28 DIAGNOSIS — R7989 Other specified abnormal findings of blood chemistry: Secondary | ICD-10-CM | POA: Diagnosis not present

## 2017-03-28 NOTE — Progress Notes (Signed)
HPI: Jamie [name] is a 61 y.o. female who  has a past medical history of Anxiety, Arthritis, Basal cell carcinoma, Breast cancer ([place]) (06/2016), Colon polyps, Eczema, Genetic testing (08/15/2016), History of radiation therapy (01/02/17-01/31/17), and PONV (postoperative nausea and vomiting).  she presents to [facility] today, 03/28/17,  for chief complaint of:  Chief Complaint  Patient presents with  . Annual Exam    No PAP; Discuss lab results      Patient here for annual physical / wellness exam.  See preventive care reviewed as below.  Recent labs reviewed in detail with the patient.    Past medical, surgical, social and family history reviewed:  Patient Active Problem List   Diagnosis Date Noted  . Diarrhea 11/05/2016  . Port catheter in place 10/23/2016  . Genetic testing 08/15/2016  . Malignant neoplasm of upper-outer quadrant of left breast in female, estrogen receptor positive ([name]) 07/10/2016  . Allergic rhinitis 06/13/2016  . History of basal cell carcinoma 06/13/2016  . Eczema 06/13/2016  . Hyperlipemia 06/13/2016  . History of colon polyps 06/13/2016  . 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 RLS (restless legs syndrome) 06/13/2016  . Hypothyroidism 07/20/2013  . Bunion 01/21/2013  . Osteopenia 01/20/2013    Past Surgical History:  Procedure Laterality Date  . BONE SPUR Bilateral 2001 AND 1988  . BUNIONECTOMY Bilateral 02/2012  . COLONOSCOPY W/ POLYPECTOMY  08/2007  . DILATION AND CURETTAGE OF UTERUS  2002  . MOHS SURGERY  2010    Social History   Tobacco Use  . Smoking status: Never Smoker  . Smokeless tobacco: Never Used  Substance Use Topics  . Alcohol use: Yes    Alcohol/week: 0.0 oz    Comment: 1-2    Family History  Problem Relation Age of Onset  . Ovarian cancer Mother 55  . Hypertension Father   . Stroke Father   . Heart disease Father   . Breast cancer Maternal Aunt 77       recurred at 25  .  Heart disease Paternal Grandmother   . Osteoporosis Sister   . Liver cancer Maternal Uncle 77     Current medication list and allergy/intolerance information reviewed:    Current Outpatient Medications  Medication Sig Dispense Refill  . anastrozole (ARIMIDEX) 1 MG tablet Take 1 tablet (1 mg total) by mouth daily. 30 tablet 12  . CALCIUM PO Take 500 mg by mouth 2 (two) times daily. Skeletal strength 343m Calium    . cetirizine (ZYRTEC) 10 MG tablet Take 10 mg by mouth daily.    . Cholecalciferol (VITAMIN D3) 1000 units CAPS Take 1 capsule by mouth daily.    . Cyanocobalamin (B-12) 2500 MCG TABS Take 1 tablet by mouth daily.     . diphenhydrAMINE (BENADRYL) 25 mg capsule Take 25 mg by mouth every 6 (six) hours as needed.    . hyaluronate sodium (RADIAPLEXRX) GEL Apply 1 application topically once.    . Lactobacillus (ACIDOPHILUS PROBIOTIC PO) Take by mouth daily.    .Marland Kitchenlidocaine-prilocaine (EMLA) cream Apply one application to port 1-2 hours prior to access. Cover with saran wrap. 30 g 3  . Multiple Vitamin (MULTIVITAMIN) tablet Take 1 tablet by mouth daily.    . non-metallic deodorant (Jethro Poling MISC Apply 1 application topically daily as needed.    .Manus GunningBOWEL PREP KIT 17.5-3.13-1.6 GM/180ML SOLN Suprep-Use as directed (Patient not taking: Reported on 03/11/2017) 354 mL 0  . SYMBICORT 160-4.5 MCG/ACT inhaler  INL 2 PFS ITL BID  1   No current facility-administered medications for this visit.     Allergies  Allergen Reactions  . Codeine Nausea Only and Other (See Comments)    Dizzy  . Sulfamethoxazole Nausea And Vomiting  . Adhesive [Tape]       Review of Systems:  Constitutional:  No  fever, no chills, No recent illness excpt undergoing cancer treatments for breast cancer, No unintentional weight changes. No significant fatigue.   HEENT: No  headache, no vision change, no hearing change, No sore throat, No  sinus pressure  Cardiac: No  chest pain, No  pressure, No palpitations,  No  Orthopnea  Respiratory:  No  shortness of breath. No  Cough  Gastrointestinal: No  abdominal pain, No  nausea, No  vomiting,  No  blood in stool, No  diarrhea, No  constipation   Musculoskeletal: No new myalgia/arthralgia  Skin: No  Rash  Endocrine: No cold intolerance,  No heat intolerance.  Neurologic: No  weakness, No  dizziness  Psychiatric: No  concerns with depression, No  concerns with anxiety  Exam:  BP 121/81 (BP Location: Right Arm, Patient Position: Sitting, Cuff Size: Large)   Pulse 69   Temp 98 F (36.7 C) (Oral)   Resp 16   Wt 149 lb 12.8 oz (67.9 kg)   SpO2 95%   BMI 25.12 kg/m   Constitutional: VS see above. General Appearance: alert, well-developed, well-nourished, NAD  Eyes: Normal lids and conjunctive, non-icteric sclera  Ears, Nose, Mouth, Throat: MMM, Normal external inspection ears/nares/mouth/lips/gums.  Neck: No masses, trachea midline. No thyroid enlargement. No tenderness/mass appreciated. No lymphadenopathy  Respiratory: Normal respiratory effort. no wheeze, no rhonchi, no rales  Cardiovascular: S1/S2 normal, no murmur, no rub/gallop auscultated. RRR. No lower extremity edema.   Gastrointestinal: Nontender, no masses. No hepatomegaly, no splenomegaly. No hernia appreciated. Bowel sounds normal. Rectal exam deferred.   Musculoskeletal: Gait normal. No clubbing/cyanosis of digits.   Neurological: Normal balance/coordination. No tremor.   Skin: warm, dry, intact. No rash/ulcer.   Psychiatric: Normal judgment/insight. Normal mood and affect. Oriented x3.   Recent Results (from the past 2160 hour(s))  CBC with Differential     Status: Abnormal   Collection Time: 01/09/17  9:24 AM  Result Value Ref Range   WBC 4.4 3.9 - 10.3 10e3/uL   NEUT# 2.6 1.5 - 6.5 10e3/uL   HGB 10.8 (L) 11.6 - 15.9 g/dL   HCT 31.8 (L) 34.8 - 46.6 %   Platelets 532 (H) 145 - 400 10e3/uL   MCV 98.7 79.5 - 101.0 fL   MCH 33.6 25.1 - 34.0 pg   MCHC 34.0 31.5 -  36.0 g/dL   RBC 3.23 (L) 3.70 - 5.45 10e6/uL   RDW 19.6 (H) 11.2 - 14.5 %   lymph# 1.1 0.9 - 3.3 10e3/uL   MONO# 0.6 0.1 - 0.9 10e3/uL   Eosinophils Absolute 0.1 0.0 - 0.5 10e3/uL   Basophils Absolute 0.1 0.0 - 0.1 10e3/uL   NEUT% 57.9 38.4 - 76.8 %   LYMPH% 24.9 14.0 - 49.7 %   MONO% 12.9 0.0 - 14.0 %   EOS% 2.8 0.0 - 7.0 %   BASO% 1.5 0.0 - 2.0 %  Comprehensive metabolic panel     Status: Abnormal   Collection Time: 01/09/17  9:24 AM  Result Value Ref Range   Sodium 141 136 - 145 mEq/L   Potassium 4.2 3.5 - 5.1 mEq/L   Chloride 108 98 - 109  mEq/L   CO2 27 22 - 29 mEq/L   Glucose 88 70 - 140 mg/dl    Comment: Glucose reference range is for nonfasting patients. Fasting glucose reference range is 70- 100.   BUN 11.8 7.0 - 26.0 mg/dL   Creatinine 0.8 0.6 - 1.1 mg/dL   Total Bilirubin 0.31 0.20 - 1.20 mg/dL   Alkaline Phosphatase 76 40 - 150 U/L   AST 20 5 - 34 U/L   ALT 17 0 - 55 U/L   Total Protein 7.0 6.4 - 8.3 g/dL   Albumin 3.8 3.5 - 5.0 g/dL   Calcium 9.6 8.4 - 10.4 mg/dL   Anion Gap 6 3 - 11 mEq/L   EGFR 83 (L) >90 ml/min/1.73 m2    Comment: eGFR is calculated using the CKD-EPI Creatinine Equation (2009)  CBC with Differential     Status: Abnormal   Collection Time: 01/30/17 11:15 AM  Result Value Ref Range   WBC 5.2 3.9 - 10.3 10e3/uL   NEUT# 3.5 1.5 - 6.5 10e3/uL   HGB 12.0 11.6 - 15.9 g/dL   HCT 35.1 34.8 - 46.6 %   Platelets 178 145 - 400 10e3/uL   MCV 98.6 79.5 - 101.0 fL   MCH 33.7 25.1 - 34.0 pg   MCHC 34.2 31.5 - 36.0 g/dL   RBC 3.55 (L) 3.70 - 5.45 10e6/uL   RDW 14.8 (H) 11.2 - 14.5 %   lymph# 0.9 0.9 - 3.3 10e3/uL   MONO# 0.5 0.1 - 0.9 10e3/uL   Eosinophils Absolute 0.3 0.0 - 0.5 10e3/uL   Basophils Absolute 0.0 0.0 - 0.1 10e3/uL   NEUT% 67.0 38.4 - 76.8 %   LYMPH% 17.6 14.0 - 49.7 %   MONO% 8.6 0.0 - 14.0 %   EOS% 6.0 0.0 - 7.0 %   BASO% 0.8 0.0 - 2.0 %  Comprehensive metabolic panel     Status: Abnormal   Collection Time: 01/30/17 11:15 AM   Result Value Ref Range   Sodium 139 136 - 145 mEq/L   Potassium 4.4 3.5 - 5.1 mEq/L   Chloride 105 98 - 109 mEq/L   CO2 26 22 - 29 mEq/L   Glucose 90 70 - 140 mg/dl    Comment: Glucose reference range is for nonfasting patients. Fasting glucose reference range is 70- 100.   BUN 10.6 7.0 - 26.0 mg/dL   Creatinine 0.8 0.6 - 1.1 mg/dL   Total Bilirubin 0.32 0.20 - 1.20 mg/dL   Alkaline Phosphatase 60 40 - 150 U/L   AST 21 5 - 34 U/L   ALT 14 0 - 55 U/L   Total Protein 6.9 6.4 - 8.3 g/dL   Albumin 4.1 3.5 - 5.0 g/dL   Calcium 9.8 8.4 - 10.4 mg/dL   Anion Gap 8 3 - 11 mEq/L   EGFR 78 (L) >90 ml/min/1.73 m2    Comment: eGFR is calculated using the CKD-EPI Creatinine Equation (2009)  CBC with Differential     Status: Abnormal   Collection Time: 02/20/17 10:28 AM  Result Value Ref Range   WBC 3.2 (L) 3.9 - 10.3 10e3/uL   NEUT# 1.7 1.5 - 6.5 10e3/uL   HGB 12.2 11.6 - 15.9 g/dL   HCT 36.6 34.8 - 46.6 %   Platelets 199 145 - 400 10e3/uL   MCV 97.9 79.5 - 101.0 fL   MCH 32.6 25.1 - 34.0 pg   MCHC 33.3 31.5 - 36.0 g/dL   RBC 3.74 3.70 - 5.45 10e6/uL  RDW 12.5 11.2 - 14.5 %   lymph# 0.9 0.9 - 3.3 10e3/uL   MONO# 0.4 0.1 - 0.9 10e3/uL   Eosinophils Absolute 0.2 0.0 - 0.5 10e3/uL   Basophils Absolute 0.0 0.0 - 0.1 10e3/uL   NEUT% 52.0 38.4 - 76.8 %   LYMPH% 29.1 14.0 - 49.7 %   MONO% 12.1 0.0 - 14.0 %   EOS% 5.6 0.0 - 7.0 %   BASO% 1.2 0.0 - 2.0 %  Comprehensive metabolic panel     Status: Abnormal   Collection Time: 02/20/17 10:28 AM  Result Value Ref Range   Sodium 140 136 - 145 mEq/L   Potassium 4.5 3.5 - 5.1 mEq/L   Chloride 106 98 - 109 mEq/L   CO2 26 22 - 29 mEq/L   Glucose 85 70 - 140 mg/dl    Comment: Glucose reference range is for nonfasting patients. Fasting glucose reference range is 70- 100.   BUN 9.4 7.0 - 26.0 mg/dL   Creatinine 0.8 0.6 - 1.1 mg/dL   Total Bilirubin 0.23 0.20 - 1.20 mg/dL   Alkaline Phosphatase 60 40 - 150 U/L   AST 21 5 - 34 U/L   ALT 14 0 -  55 U/L   Total Protein 7.2 6.4 - 8.3 g/dL   Albumin 4.0 3.5 - 5.0 g/dL   Calcium 9.8 8.4 - 10.4 mg/dL   Anion Gap 8 3 - 11 mEq/L   EGFR 80 (L) >90 ml/min/1.73 m2    Comment: eGFR is calculated using the CKD-EPI Creatinine Equation (2009)  CBC with Differential     Status: Abnormal   Collection Time: 03/13/17  9:34 AM  Result Value Ref Range   WBC 3.4 (L) 3.9 - 10.3 10e3/uL   NEUT# 1.6 1.5 - 6.5 10e3/uL   HGB 12.4 11.6 - 15.9 g/dL   HCT 35.9 34.8 - 46.6 %   Platelets 244 145 - 400 10e3/uL   MCV 93.9 79.5 - 101.0 fL   MCH 32.4 25.1 - 34.0 pg   MCHC 34.5 31.5 - 36.0 g/dL   RBC 3.83 3.70 - 5.45 10e6/uL   RDW 12.9 11.2 - 14.5 %   lymph# 1.0 0.9 - 3.3 10e3/uL   MONO# 0.4 0.1 - 0.9 10e3/uL   Eosinophils Absolute 0.3 0.0 - 0.5 10e3/uL   Basophils Absolute 0.0 0.0 - 0.1 10e3/uL   NEUT% 49.1 38.4 - 76.8 %   LYMPH% 29.5 14.0 - 49.7 %   MONO% 12.4 0.0 - 14.0 %   EOS% 8.0 (H) 0.0 - 7.0 %   BASO% 1.0 0.0 - 2.0 %  Comprehensive metabolic panel     Status: None   Collection Time: 03/13/17  9:34 AM  Result Value Ref Range   Sodium 139 136 - 145 mEq/L   Potassium 4.4 3.5 - 5.1 mEq/L   Chloride 106 98 - 109 mEq/L   CO2 25 22 - 29 mEq/L   Glucose 84 70 - 140 mg/dl    Comment: Glucose reference range is for nonfasting patients. Fasting glucose reference range is 70- 100.   BUN 10.3 7.0 - 26.0 mg/dL   Creatinine 0.8 0.6 - 1.1 mg/dL   Total Bilirubin <0.22 0.20 - 1.20 mg/dL   Alkaline Phosphatase 58 40 - 150 U/L   AST 20 5 - 34 U/L   ALT 15 0 - 55 U/L   Total Protein 6.9 6.4 - 8.3 g/dL   Albumin 3.8 3.5 - 5.0 g/dL   Calcium 9.4 8.4 - 10.4  mg/dL   Anion Gap 7 3 - 11 mEq/L   EGFR >60 >60 ml/min/1.73 m2    Comment: eGFR is calculated using the CKD-EPI Creatinine Equation (2009)  CBC     Status: None   Collection Time: 03/25/17  8:16 AM  Result Value Ref Range   WBC 4.5 3.8 - 10.8 Thousand/uL   RBC 4.02 3.80 - 5.10 Million/uL   Hemoglobin 12.6 11.7 - 15.5 g/dL   HCT 36.9 35.0 - 45.0 %    MCV 91.8 80.0 - 100.0 fL   MCH 31.3 27.0 - 33.0 pg   MCHC 34.1 32.0 - 36.0 g/dL   RDW 11.7 11.0 - 15.0 %   Platelets 277 140 - 400 Thousand/uL   MPV 9.2 7.5 - 12.5 fL  COMPLETE METABOLIC PANEL WITH GFR     Status: None   Collection Time: 03/25/17  8:16 AM  Result Value Ref Range   Glucose, Bld 92 65 - 99 mg/dL    Comment: .            Fasting reference interval .    BUN 13 7 - 25 mg/dL   Creat 0.86 0.50 - 0.99 mg/dL    Comment: For patients >47 years of age, the reference limit for Creatinine is approximately 13% higher for people identified as African-American. .    GFR, Est Non African American 72 > OR = 60 mL/min/1.96m   GFR, Est African American 84 > OR = 60 mL/min/1.742m  BUN/Creatinine Ratio NOT APPLICABLE 6 - 22 (calc)   Sodium 140 135 - 146 mmol/L   Potassium 4.2 3.5 - 5.3 mmol/L   Chloride 104 98 - 110 mmol/L   CO2 29 20 - 32 mmol/L   Calcium 9.5 8.6 - 10.4 mg/dL   Total Protein 6.7 6.1 - 8.1 g/dL   Albumin 4.2 3.6 - 5.1 g/dL   Globulin 2.5 1.9 - 3.7 g/dL (calc)   AG Ratio 1.7 1.0 - 2.5 (calc)   Total Bilirubin 0.3 0.2 - 1.2 mg/dL   Alkaline phosphatase (APISO) 56 33 - 130 U/L   AST 17 10 - 35 U/L   ALT 12 6 - 29 U/L  Lipid panel     Status: Abnormal   Collection Time: 03/25/17  8:16 AM  Result Value Ref Range   Cholesterol 234 (H) <200 mg/dL   HDL 63 >50 mg/dL   Triglycerides 138 <150 mg/dL   LDL Cholesterol (Calc) 144 (H) mg/dL (calc)    Comment: Reference range: <100 . Desirable range <100 mg/dL for primary prevention;   <70 mg/dL for patients with CHD or diabetic patients  with > or = 2 CHD risk factors. . Marland KitchenDL-C is now calculated using the Martin-Hopkins  calculation, which is a validated novel method providing  better accuracy than the Friedewald equation in the  estimation of LDL-C.  MaCresenciano Genret al. JAAnnamaria [phone]  (http://education.[facility].com/faq/FAQ164)    Total CHOL/HDL Ratio 3.7 <5.0 (calc)   Non-HDL Cholesterol  (Calc) 171 (H) <130 mg/dL (calc)    Comment: For patients with diabetes plus 1 major ASCVD risk  factor, treating to a non-HDL-C goal of <100 mg/dL  (LDL-C of <70 mg/dL) is considered a therapeutic  option.   TSH     Status: Abnormal   Collection Time: 03/25/17  8:16 AM  Result Value Ref Range   TSH 4.96 (H) 0.40 - 4.50 mIU/L  VITAMIN D 25 Hydroxy (Vit-D Deficiency, Fractures)     Status: None  Collection Time: 03/25/17  8:16 AM  Result Value Ref Range   Vit D, 25-Hydroxy 44 30 - 100 ng/mL    Comment: Vitamin D Status         25-OH Vitamin D: . Deficiency:                    <20 ng/mL Insufficiency:             20 - 29 ng/mL Optimal:                 > or = 30 ng/mL . For 25-OH Vitamin D testing on patients on  D2-supplementation and patients for whom quantitation  of D2 and D3 fractions is required, the [facility](TM) 25-OH VIT D, (D2,D3), LC/MS/MS is recommended: order  code 617-379-2949 (patients >63yr). . For more information on this test, go to: http://education.[facility].com/faq/FAQ163 (This link is being provided for  informational/educational purposes only.)     ASSESSMENT/PLAN:   Annual physical exam  Abnormal TSH - Plan: TSH, T4, free  Osteopenia, unspecified location   FEMALE PREVENTIVE CARE Updated 03/28/17   ANNUAL SCREENING/COUNSELING  Diet/Exercise - HEALTHY HABITS DISCUSSED TO DECREASE CV RISK Social History   Tobacco Use  Smoking Status Never Smoker  Smokeless Tobacco Never Used    Social History   Substance and Sexual Activity  Alcohol Use Yes  . Alcohol/week: 0.0 oz   Comment: 1-2    Depression screen PHQ 2/9 12/19/2016  Decreased Interest 0  Down, Depressed, Hopeless 0  PHQ - 2 Score 0     Domestic violence concerns - no  [place]ING - [place] Sexually active in the past year - Yes with female.  Need/want STI testing today? - no  Concerns about libido or pain with sex? - no  Plans for pregnancy? -  n/a  INFECTIOUS DISEASE SCREENING  HIV - does not need  GC/CT - does not need  HepC - DOB [birth year] - does not need  TB - does not need  DISEASE SCREENING  Lipid - does not need  DM2 - does not need  Osteoporosis - women age 62+- does not need (+)osteopenia on recent DEXA  CANCER SCREENING  Cervical - does not need  Breast - does not need  Lung - does not need  Colon - needs 2019   ADULT VACCINATION  Influenza - annual vaccine recommended  Td - booster every 10 years   Zoster - Shingrix recommended 50+  PCV13 - was not indicated  PPSV23 - was not indicated Immunization History  Administered Date(s) Administered  . Influenza,inj,Quad PF,6+ Mos 02/13/2017  . Influenza-Unspecified 03/08/2015  . Td 05/17/1999  . Tdap 10/30/2010  . Zoster 02/15/2015        Patient Instructions  Vit D [phone] units daily Calcium 1200-1500 mg total daily     Visit summary with medication list and pertinent instructions was printed for patient to review. All questions at time of visit were answered - patient instructed to contact office with any additional concerns. ER/RTC precautions were reviewed with the patient. Follow-up plan: Return in about 1 year (around 03/28/2018) for ADeloit.   Please note: voice recognition software was used to produce this document, and typos may escape review. Please contact Dr. ASheppard Coilfor any needed clarifications.

## 2017-03-28 NOTE — Patient Instructions (Signed)
Vit D [phone] units daily Calcium 1200-1500 mg total daily

## 2017-04-02 NOTE — Progress Notes (Signed)
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 This showed a possible mass in the left breast. On 07/02/2016 she underwent left diagnostic mammography with tomography and left breast ultrasonography. The breast density was category C. In the left breast upper outer quadrant there was a microlobulated mass which was not directly palpable, although there was slight thickening in the left breast 1:00 position. Targeted ultrasonography confirmed a solid hypoechoic microlobulated mass in the left breast 1:00 radiant 4 cm from the nipple, measuring 0.8 cm.  On 07/03/2016 Jamie [name] underwent biopsy of the left breast mass in question, and this showed ([id number]) invasive ductal carcinoma, grade 2 or 3, with extracellular mucin, estrogen receptor 5% positive with weak staining intensity, progesterone receptor negative, with an MIB-1 of 50%, and HER-2 amplified, the signals ratio being 5.71 and the number per cell 14.55.  Her subsequent history is as detailed below  INTERVAL HISTORY: Jamie [name] returns today for follow-up of her estrogen receptor positive and HER-2 amplified breast cancer. She is accompanied by her husband, Jamie [name]. She continues on trastuzumab, with  good tolerance. Today is day 1, cycle 12.  She started anastrozole February 18, 2017. She paid $10 for 1 month. She reports having hot flashes before she started but notes that no increase has occurred. She denies vaginal dryness.  Since her last visit to the office, she has had a bone density scan completed at [facility] on 02/26/2017 with results showing: T-score of -2.0 at the left femur neck.     REVIEW OF SYSTEMS: Jamie [name] reports that she is well overall. She has noted some rough sensations in her fingertips that she notes feels like sandpaper. She reports that she rarely feels numbness, but they are painful at times. She notes that she gets these sensations when she washes her hands or when it gets cold outside. Since the radiation has ended, she reports that her right nipple gets irritated and red occasionally. She is using saline to clean the treatment area. She is working on increasing the range of motion of her left arm by lifting her arm. Along with this, she exercises everyday by going to the gym about 5 times per week. She also walks and does yoga. She denies unusual headaches, visual changes, nausea, vomiting, or dizziness. There has been no unusual cough, phlegm production, or pleurisy. This been no change in bowel or bladder habits. She denies unexplained fatigue or unexplained weight loss, bleeding, rash, or fever. A detailed review of systems was otherwise stable.   PAST MEDICAL HISTORY: Past Medical History:  Diagnosis Date  . Anxiety   . Arthritis    feet  . Basal cell carcinoma   . Breast cancer ([place]) 06/2016   left breast  . Colon polyps   . Eczema   . Genetic testing 08/15/2016   Ms. Wnek underwent genetic testing for hereditary cancer syndrome  through [facility]'s 43-gene Common Hereditary Cancers Panel. Ms. 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 Result report is dated 08/15/2016. Please see  genetic counseling documentation from 08/17/2016 for further discussion.  [name] History of radiation therapy 01/02/17-01/31/17   left breast was treated to 42.72 Gy in 16 fractions, left breast boost 10 Gy in 5 fractions  . PONV (postoperative nausea and vomiting)     PAST SURGICAL HISTORY: Past Surgical History:  Procedure Laterality Date  . BONE SPUR Bilateral 2001 AND 1988  . BUNIONECTOMY Bilateral 02/2012  . COLONOSCOPY W/ POLYPECTOMY  08/2007  . DILATION AND CURETTAGE OF UTERUS  2002  . MOHS SURGERY  2010    FAMILY HISTORY Family History  Problem Relation Age of Onset  . Ovarian cancer Mother 33  . Hypertension Father   . Stroke Father   . Heart disease Father   . Breast cancer Maternal Aunt 77       recurred at 30  . Heart disease Paternal Grandmother   . Osteoporosis Sister   . Liver cancer Maternal Uncle 74  The patient's father died at age 53, with some form of skin cancer, most likely melanoma. The patient's mother died at the age of 43 with ovarian cancer, which had been diagnosed a few months prior. The patient had no brothers, 1 sister. A maternal aunt was diagnosed with breast cancer at age 3, recurrent age 61.  GYNECOLOGIC HISTORY:  No LMP recorded. Patient is postmenopausal. Menarche age 32, the patient is GX P0. She stopped having periods approximately age 7. 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 Her husband Jamie [name] worked as a Dance movement psychotherapist for a [employer]. Their last name is pronounced foh-TEE-ah and they tell me it means "burning" in [place]. Their children are adopted. Jamie [name] lives in [place] and works in Press photographer, and Ojai lives in [place] and is an Scientist, water quality. The patient has 3 grandchildren. She is not a Ambulance person.    ADVANCED DIRECTIVES: In place   HEALTH MAINTENANCE: Social History   Tobacco Use  . Smoking status: Never Smoker  . Smokeless tobacco:  Never Used  Substance Use Topics  . Alcohol use: Yes    Alcohol/week: 0.0 oz    Comment: 1-2  . Drug use: No     Colonoscopy:2009  NUU:VOZDGU 2018  Bone density:April 2016   Allergies  Allergen Reactions  . Codeine Nausea Only and Other (See Comments)    Dizzy  . Sulfamethoxazole Nausea And Vomiting  . Adhesive [Tape]     Current Outpatient Medications  Medication Sig Dispense Refill  . anastrozole (ARIMIDEX) 1 MG tablet Take 1 tablet (1 mg total) by mouth daily. 30 tablet 12  . CALCIUM PO Take 500 mg by mouth 2 (two) times daily. Skeletal strength 375m Calium    . cetirizine (ZYRTEC) 10 MG tablet Take 10 mg by mouth daily.    . Cholecalciferol (VITAMIN D3) 1000 units CAPS Take 1 capsule by mouth daily.    . Cyanocobalamin (B-12) 2500 MCG TABS Take 1 tablet by mouth daily.     . diphenhydrAMINE (BENADRYL) 25 mg capsule Take 25 mg by mouth every 6 (six) hours as needed.    . hyaluronate sodium (RADIAPLEXRX) GEL Apply 1 application topically once.    . Lactobacillus (ACIDOPHILUS PROBIOTIC PO) Take by mouth daily.    .[name]  lidocaine-prilocaine (EMLA) cream Apply one application to port 1-2 hours prior to access. Cover with saran wrap. 30 g 3  . Multiple Vitamin (MULTIVITAMIN) tablet Take 1 tablet by mouth daily.    . non-metallic deodorant Jethro Poling) MISC Apply 1 application topically daily as needed.    Manus Gunning BOWEL PREP KIT 17.5-3.13-1.6 GM/180ML SOLN Suprep-Use as directed 354 mL 0  . SYMBICORT 160-4.5 MCG/ACT inhaler INL 2 PFS ITL BID  1   No current facility-administered medications for this visit.     OBJECTIVE: Middle-aged white woman who appears well  Vitals:   04/03/17 0903  BP: 117/64  Pulse: 66  Resp: 20  Temp: 98.3 F (36.8 C)  SpO2: 100%     Body mass index is 25.04 kg/m.   Filed Weights   04/03/17 0903  Weight: 149 lb 4.8 oz (67.7 kg)     ECOG FS:0 - Asymptomatic  Sclerae unicteric, pupils round and equal Oropharynx clear and moist No cervical or  supraclavicular adenopathy Lungs no rales or rhonchi Heart regular rate and rhythm Abd soft, nontender, positive bowel sounds MSK no focal spinal tenderness, no upper extremity lymphedema Neuro: nonfocal, well oriented, appropriate affect Breasts: The right breast is unremarkable.  The left breast is status post lumpectomy and radiation.  Essentially all treatment effects have resolved except that there is slight irregularity in the nipple.  There is slight tenderness.  There is no erythema or swelling.  There is no discharge.  Both axillae are benign.  LAB RESULTS:  CMP     Component Value Date/Time   NA 141 04/03/2017 0813   K 4.3 04/03/2017 0813   CL 104 03/25/2017 0816   CO2 27 04/03/2017 0813   GLUCOSE 78 04/03/2017 0813   BUN 6.8 (L) 04/03/2017 0813   CREATININE 0.8 04/03/2017 0813   CALCIUM 9.7 04/03/2017 0813   PROT 7.0 04/03/2017 0813   ALBUMIN 3.9 04/03/2017 0813   AST 20 04/03/2017 0813   ALT 13 04/03/2017 0813   ALKPHOS 57 04/03/2017 0813   BILITOT <0.22 04/03/2017 0813   GFRNONAA 72 03/25/2017 0816   GFRAA 84 03/25/2017 0816    INo results found for: SPEP, UPEP  Lab Results  Component Value Date   WBC 4.0 04/03/2017   NEUTROABS 1.9 04/03/2017   HGB 12.6 04/03/2017   HCT 37.0 04/03/2017   MCV 92.4 04/03/2017   PLT 255 04/03/2017      Chemistry      Component Value Date/Time   NA 141 04/03/2017 0813   K 4.3 04/03/2017 0813   CL 104 03/25/2017 0816   CO2 27 04/03/2017 0813   BUN 6.8 (L) 04/03/2017 0813   CREATININE 0.8 04/03/2017 0813   GLU 73 08/02/2014      Component Value Date/Time   CALCIUM 9.7 04/03/2017 0813   ALKPHOS 57 04/03/2017 0813   AST 20 04/03/2017 0813   ALT 13 04/03/2017 0813   BILITOT <0.22 04/03/2017 0813       No results found for: LABCA2  No components found for: LABCA125  No results for input(s): INR in the last 168 hours.  Urinalysis    Component Value Date/Time   COLORURINE COLORLESS (A) 12/22/2016 2105    APPEARANCEUR CLEAR 12/22/2016 2105   LABSPEC 1.005 12/22/2016 2105   PHURINE 7.0 12/22/2016 2105   GLUCOSEU NEGATIVE 12/22/2016 2105   HGBUR NEGATIVE 12/22/2016 2105   [place] NEGATIVE 12/22/2016 2105   [place] 12/22/2016 2105   PROTEINUR NEGATIVE 12/22/2016 2105  NITRITE NEGATIVE 12/22/2016 2105   LEUKOCYTESUR NEGATIVE 12/22/2016 2105     STUDIES: The bone density results and echocardiogram results were discussed with the patient.  She was given copies.  ELIGIBLE FOR AVAILABLE RESEARCH PROTOCOL: no  ASSESSMENT: 61 y.o. Jamie [name], [name] woman status post biopsy of the left breast upper outer quadrant lesion 07/03/2016 showing a clinical T1b N0, stage 1B invasive ductal carcinoma, grade 2 or 3, estrogen receptor weakly positive at 5%, progesterone receptor negative, but HER-2 strongly amplified, with an MIB-1 of 50%.  (1) status post left lumpectomy with sentinel lymph node sampling 07/26/2016 for a pT1c pN0, stage IA invasive ductal carcinoma, grade 3, with extracellular mucin, and negative margins  (2) chemotherapy consisting of Paclitaxel weekly starting 09/18/2016, discontinued after one cycle due to peripheral neuropathy.   (a) Gemcitabine and Carboplatin started 10/02/2016, repeated days 1 and 8 of each 21 day cycle to a total of 8 doses (4 cycles)-- final dose 12/18/2016  (3) trastuzumab started 08/07/2016, to continue for 12 months  (a) baseline echocardiogram 07/25/2016 shows an ejection fraction of 60-65%  (b) repeat echocardiogram 10/22/2016 shows stable ejection fraction  (c) echocardiogram January 24, 2017 found an ejection fraction in the 55-60% range.  (4) adjuvant radiation 01/02/2017-01/31/2017:  Left breast was treated to 42.72 Gy in 16 fractions and boosted to 10 Gy in 5 fractions.  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   (a) associated with increased colorectal cancer risk, possibly breast cancer (at least in [place])  (b) colonoscopy 09/11/2007/ Mallie Mussel [facility]     PLAN: Ami has completed her local treatment for breast cancer and continues on trastuzumab and now anastrozole, both with excellent tolerance.  The trastuzumab will and the last week in February.  I have added that extra visit and treatment today.  I offered to change her treatment December 26 but she is comfortable with that date.  We discussed the bone density and she is already on vitamin D supplementation and has an excellent exercise regimen.  The plan is to continue that and repeat a bone density in 2 years.  She is scheduled for colonoscopy under Dr. Havery Moros in November.  She will also have a EGD at that time.  She is already scheduled for her next echocardiogram December 2018.  She will see me again in January.  She knows to call for any problems that may develop before that visit.  Gehrig Patras, [name], MD  04/03/17 9:23 AM Medical Oncology and Hematology [facility] [address] [place], [place] [address] Tel. [phone]    Fax. [phone]  This document serves as a record of services personally performed by Lurline Del, MD. It was created on his behalf by Sheron Nightingale, a trained medical scribe. The creation of this record is based on the scribe's personal observations and the provider's statements to them.   I have reviewed the above documentation for accuracy and completeness, and I agree with the above.

## 2017-04-03 ENCOUNTER — Other Ambulatory Visit (HOSPITAL_BASED_OUTPATIENT_CLINIC_OR_DEPARTMENT_OTHER): Payer: Managed Care, Other (non HMO)

## 2017-04-03 ENCOUNTER — Ambulatory Visit (HOSPITAL_BASED_OUTPATIENT_CLINIC_OR_DEPARTMENT_OTHER): Payer: Managed Care, Other (non HMO) | Admitting: Oncology

## 2017-04-03 ENCOUNTER — Ambulatory Visit (HOSPITAL_BASED_OUTPATIENT_CLINIC_OR_DEPARTMENT_OTHER): Payer: Managed Care, Other (non HMO)

## 2017-04-03 ENCOUNTER — Encounter: Payer: Self-pay | Admitting: *Deleted

## 2017-04-03 ENCOUNTER — Ambulatory Visit: Payer: Managed Care, Other (non HMO)

## 2017-04-03 ENCOUNTER — Telehealth: Payer: Self-pay | Admitting: Oncology

## 2017-04-03 VITALS — BP 117/64 | HR 66 | Temp 98.3°F | Resp 20 | Ht 64.75 in | Wt 149.3 lb

## 2017-04-03 DIAGNOSIS — Z5112 Encounter for antineoplastic immunotherapy: Secondary | ICD-10-CM

## 2017-04-03 DIAGNOSIS — C50412 Malignant neoplasm of upper-outer quadrant of left female breast: Secondary | ICD-10-CM

## 2017-04-03 DIAGNOSIS — Z95828 Presence of other vascular implants and grafts: Secondary | ICD-10-CM

## 2017-04-03 DIAGNOSIS — Z79811 Long term (current) use of aromatase inhibitors: Secondary | ICD-10-CM

## 2017-04-03 DIAGNOSIS — Z170 Estrogen receptor positive status [ER+]: Secondary | ICD-10-CM

## 2017-04-03 LAB — CBC WITH DIFFERENTIAL/PLATELET
BASO%: 1.7 % (ref 0.0–2.0)
Basophils Absolute: 0.1 10*3/uL (ref 0.0–0.1)
EOS%: 10.4 % — AB (ref 0.0–7.0)
Eosinophils Absolute: 0.4 10*3/uL (ref 0.0–0.5)
HEMATOCRIT: 37 % (ref 34.8–46.6)
HGB: 12.6 g/dL (ref 11.6–15.9)
LYMPH#: 1.1 10*3/uL (ref 0.9–3.3)
LYMPH%: 27.1 % (ref 14.0–49.7)
MCH: 31.4 pg (ref 25.1–34.0)
MCHC: 33.9 g/dL (ref 31.5–36.0)
MCV: 92.4 fL (ref 79.5–101.0)
MONO#: 0.5 10*3/uL (ref 0.1–0.9)
MONO%: 13.1 % (ref 0.0–14.0)
NEUT%: 47.7 % (ref 38.4–76.8)
NEUTROS ABS: 1.9 10*3/uL (ref 1.5–6.5)
Platelets: 255 10*3/uL (ref 145–400)
RBC: 4.01 10*6/uL (ref 3.70–5.45)
RDW: 13.1 % (ref 11.2–14.5)
WBC: 4 10*3/uL (ref 3.9–10.3)

## 2017-04-03 LAB — COMPREHENSIVE METABOLIC PANEL
ALT: 13 U/L (ref 0–55)
AST: 20 U/L (ref 5–34)
Albumin: 3.9 g/dL (ref 3.5–5.0)
Alkaline Phosphatase: 57 U/L (ref 40–150)
Anion Gap: 7 mEq/L (ref 3–11)
BUN: 6.8 mg/dL — AB (ref 7.0–26.0)
CHLORIDE: 107 meq/L (ref 98–109)
CO2: 27 meq/L (ref 22–29)
CREATININE: 0.8 mg/dL (ref 0.6–1.1)
Calcium: 9.7 mg/dL (ref 8.4–10.4)
EGFR: >60 mL/min/{1.73_m2} (ref >60)
Glucose: 78 mg/dl (ref 70–140)
Potassium: 4.3 mEq/L (ref 3.5–5.1)
Sodium: 141 mEq/L (ref 136–145)
Total Bilirubin: <0.22 mg/dL (ref 0.20–1.20)
Total Protein: 7 g/dL (ref 6.4–8.3)

## 2017-04-03 MED ORDER — ACETAMINOPHEN 325 MG PO TABS
ORAL_TABLET | ORAL | Status: AC
  Filled 2017-04-03: qty 2

## 2017-04-03 MED ORDER — DIPHENHYDRAMINE HCL 25 MG PO CAPS
25.0000 mg | ORAL_CAPSULE | Freq: Once | ORAL | Status: AC
  Administered 2017-04-03: 25 mg via ORAL

## 2017-04-03 MED ORDER — ACETAMINOPHEN 325 MG PO TABS
650.0000 mg | ORAL_TABLET | Freq: Once | ORAL | Status: AC
  Administered 2017-04-03: 650 mg via ORAL

## 2017-04-03 MED ORDER — SODIUM CHLORIDE 0.9% FLUSH
10.0000 mL | INTRAVENOUS | Status: DC | PRN
  Administered 2017-04-03: 10 mL
  Filled 2017-04-03: qty 10

## 2017-04-03 MED ORDER — DIPHENHYDRAMINE HCL 25 MG PO CAPS
ORAL_CAPSULE | ORAL | Status: AC
  Filled 2017-04-03: qty 1

## 2017-04-03 MED ORDER — SODIUM CHLORIDE 0.9% FLUSH
10.0000 mL | Freq: Once | INTRAVENOUS | Status: AC
  Administered 2017-04-03: 10 mL
  Filled 2017-04-03: qty 10

## 2017-04-03 MED ORDER — TRASTUZUMAB CHEMO 150 MG IV SOLR
6.0000 mg/kg | Freq: Once | INTRAVENOUS | Status: AC
  Administered 2017-04-03: 399 mg via INTRAVENOUS
  Filled 2017-04-03: qty 19

## 2017-04-03 MED ORDER — SODIUM CHLORIDE 0.9 % IV SOLN
Freq: Once | INTRAVENOUS | Status: AC
  Administered 2017-04-03: 10:00:00 via INTRAVENOUS

## 2017-04-03 MED ORDER — HEPARIN SOD (PORK) LOCK FLUSH 100 UNIT/ML IV SOLN
500.0000 [IU] | Freq: Once | INTRAVENOUS | Status: AC | PRN
  Administered 2017-04-03: 500 [IU]
  Filled 2017-04-03: qty 5

## 2017-04-03 NOTE — Patient Instructions (Signed)
[facility] Discharge Instructions for Patients Receiving Chemotherapy  Today you received the following chemotherapy agents Herceptin  To help prevent nausea and vomiting after your treatment, we encourage you to take your nausea medication as needed   If you develop nausea and vomiting that is not controlled by your nausea medication, call the clinic.   BELOW ARE SYMPTOMS THAT SHOULD BE REPORTED IMMEDIATELY:  *FEVER GREATER THAN 100.5 F  *CHILLS WITH OR WITHOUT FEVER  NAUSEA AND VOMITING THAT IS NOT CONTROLLED WITH YOUR NAUSEA MEDICATION  *UNUSUAL SHORTNESS OF BREATH  *UNUSUAL BRUISING OR BLEEDING  TENDERNESS IN MOUTH AND THROAT WITH OR WITHOUT PRESENCE OF ULCERS  *URINARY PROBLEMS  *BOWEL PROBLEMS  UNUSUAL RASH Items with * indicate a potential emergency and should be followed up as soon as possible.  Feel free to call the clinic should you have any questions or concerns. The clinic phone number is [phone].  Please show the [place] at check-in to the Emergency Department and triage nurse.

## 2017-04-03 NOTE — Telephone Encounter (Signed)
Added follow up GM to 06/05/17 appointments. Spoke with patient and she will get print out in infusion.

## 2017-04-17 ENCOUNTER — Encounter: Payer: Self-pay | Admitting: Gastroenterology

## 2017-04-17 ENCOUNTER — Ambulatory Visit (AMBULATORY_SURGERY_CENTER): Payer: Managed Care, Other (non HMO) | Admitting: Gastroenterology

## 2017-04-17 VITALS — BP 105/57 | HR 63 | Temp 98.0°F | Resp 12 | Ht 64.75 in | Wt 151.0 lb

## 2017-04-17 DIAGNOSIS — K317 Polyp of stomach and duodenum: Secondary | ICD-10-CM

## 2017-04-17 DIAGNOSIS — Z1211 Encounter for screening for malignant neoplasm of colon: Secondary | ICD-10-CM

## 2017-04-17 DIAGNOSIS — R898 Other abnormal findings in specimens from other organs, systems and tissues: Secondary | ICD-10-CM

## 2017-04-17 DIAGNOSIS — D122 Benign neoplasm of ascending colon: Secondary | ICD-10-CM | POA: Diagnosis not present

## 2017-04-17 MED ORDER — SODIUM CHLORIDE 0.9 % IV SOLN: 500.0000 mL | INTRAVENOUS | Status: DC

## 2017-04-17 NOTE — Progress Notes (Signed)
Report to PACU, RN, vss, BBS= Clear.  

## 2017-04-17 NOTE — Op Note (Addendum)
[facility] Patient Name: Jamie [name] Procedure Date: 04/17/2017 11:15 AM MRN: [id number] Endoscopist: [name] P. Fisher Hargadon MD, MD Age: 61 Referring MD:  Date of Birth: [birth year] Gender: Female Account #: [id number] Procedure:                Upper GI endoscopy Indications:              abnormal genetic testing - single mutation in                            MUTYH, history of breast cancer, screening exam for                            upper tract malignancy Medicines:                Monitored Anesthesia Care Procedure:                Pre-Anesthesia Assessment:                           - Prior to the procedure, a History and Physical                            was performed, and patient medications and                            allergies were reviewed. The patient's tolerance of                            previous anesthesia was also reviewed. The risks                            and benefits of the procedure and the sedation                            options and risks were discussed with the patient.                            All questions were answered, and informed consent                            was obtained. Prior Anticoagulants: The patient has                            taken no previous anticoagulant or antiplatelet                            agents. ASA Grade Assessment: II - A patient with                            mild systemic disease. After reviewing the risks                            and benefits, the patient was deemed in  satisfactory condition to undergo the procedure.                           After obtaining informed consent, the endoscope was                            passed under direct vision. Throughout the                            procedure, the patient's blood pressure, pulse, and                            oxygen saturations were monitored continuously. The                            Endoscope was introduced  through the mouth, and                            advanced to the second part of duodenum. The upper                            GI endoscopy was accomplished without difficulty.                            The patient tolerated the procedure well. Scope In: Scope Out: Findings:                 Esophagogastric landmarks were identified: the                            Z-line was found at 37 cm, the gastroesophageal                            junction was found at 37 cm and the upper extent of                            the gastric folds was found at 37 cm from the                            incisors.                           A single polyp was found 37 cm from the incisors at                            the underside of the GEJ, better seen on                            retroflexion, it was 62mm in size or so, benign                            appearing. Biopsies were taken with a cold forceps  for histology.                           The exam of the esophagus was otherwise normal.                           The entire examined stomach was normal. Biopsies                            were taken from the antrum, body, and incisura with                            a cold forceps for Helicobacter pylori testing.                           The duodenal bulb and second portion of the                            duodenum were normal. Complications:            No immediate complications. Estimated blood loss:                            Minimal. Estimated Blood Loss:     Estimated blood loss was minimal. Impression:               - Esophagogastric landmarks identified.                           - Small benign appearing polyp adjacent to the GEJ,                            benign appearing. Biopsied.                           - Normal stomach. Biopsied to rule out H pylori.                           - Normal duodenal bulb and second portion of the                             duodenum. Recommendation:           - Patient has a contact number available for                            emergencies. The signs and symptoms of potential                            delayed complications were discussed with the                            patient. Return to normal activities tomorrow.                            Written discharge instructions were provided to the  patient.                           - Resume previous diet.                           - Continue present medications.                           - Await pathology results. [name] P. Akio Hudnall MD, MD 04/17/2017 12:11:32 PM This report has been signed electronically.

## 2017-04-17 NOTE — Patient Instructions (Signed)
*  Handout given on polyps**   YOU HAD AN ENDOSCOPIC PROCEDURE TODAY AT [facility]:   Refer to the procedure report that was given to you for any specific questions about what was found during the examination.  If the procedure report does not answer your questions, please call your gastroenterologist to clarify.  If you requested that your care partner not be given the details of your procedure findings, then the procedure report has been included in a sealed envelope for you to review at your convenience later.  YOU SHOULD EXPECT: Some feelings of bloating in the abdomen. Passage of more gas than usual.  Walking can help get rid of the air that was put into your GI tract during the procedure and reduce the bloating. If you had a lower endoscopy (such as a colonoscopy or flexible sigmoidoscopy) you may notice spotting of blood in your stool or on the toilet paper. If you underwent a bowel prep for your procedure, you may not have a normal bowel movement for a few days.  Please Note:  You might notice some irritation and congestion in your nose or some drainage.  This is from the oxygen used during your procedure.  There is no need for concern and it should clear up in a day or so.  SYMPTOMS TO REPORT IMMEDIATELY:   Following lower endoscopy (colonoscopy or flexible sigmoidoscopy):  Excessive amounts of blood in the stool  Significant tenderness or worsening of abdominal pains  Swelling of the abdomen that is new, acute  Fever of 100F or higher   Following upper endoscopy (EGD)  Vomiting of blood or coffee ground material  New chest pain or pain under the shoulder blades  Painful or persistently difficult swallowing  New shortness of breath  Fever of 100F or higher  Black, tarry-looking stools  For urgent or emergent issues, a gastroenterologist can be reached at any hour by calling [phone].   DIET:  We do recommend a small meal at first, but then you may proceed  to your regular diet.  Drink plenty of fluids but you should avoid alcoholic beverages for 24 hours.  ACTIVITY:  You should plan to take it easy for the rest of today and you should NOT DRIVE or use heavy machinery until tomorrow (because of the sedation medicines used during the test).    FOLLOW UP: Our staff will call the number listed on your records the next business day following your procedure to check on you and address any questions or concerns that you may have regarding the information given to you following your procedure. If we do not reach you, we will leave a message.  However, if you are feeling well and you are not experiencing any problems, there is no need to return our call.  We will assume that you have returned to your regular daily activities without incident.  If any biopsies were taken you will be contacted by phone or by letter within the next 1-3 weeks.  Please call us at [phone] if you have not heard about the biopsies in 3 weeks.    SIGNATURES/CONFIDENTIALITY: You and/or your care partner have signed paperwork which will be entered into your electronic medical record.  These signatures attest to the fact that that the information above on your After Visit Summary has been reviewed and is understood.  Full responsibility of the confidentiality of this discharge information lies with you and/or your care-partner.

## 2017-04-17 NOTE — Op Note (Signed)
[facility] Patient Name: Jamie [name] Procedure Date: 04/17/2017 11:15 AM MRN: [id number] Endoscopist: [name] P. Armbruster MD, MD Age: 61 Referring MD:  Date of Birth: 1955/10/27 Gender: Female Account #: [id number] Procedure:                Colonoscopy Indications:              abnormal genetic testing, single mutation for                            MUTYH, history of breast cancer, screening Medicines:                Monitored Anesthesia Care Procedure:                Pre-Anesthesia Assessment:                           - Prior to the procedure, a History and Physical                            was performed, and patient medications and                            allergies were reviewed. The patient's tolerance of                            previous anesthesia was also reviewed. The risks                            and benefits of the procedure and the sedation                            options and risks were discussed with the patient.                            All questions were answered, and informed consent                            was obtained. Prior Anticoagulants: The patient has                            taken no previous anticoagulant or antiplatelet                            agents. ASA Grade Assessment: II - A patient with                            mild systemic disease. After reviewing the risks                            and benefits, the patient was deemed in                            satisfactory condition to undergo the procedure.  After obtaining informed consent, the colonoscope                            was passed under direct vision. Throughout the                            procedure, the patient's blood pressure, pulse, and                            oxygen saturations were monitored continuously. The                            Colonoscope was introduced through the anus and                            advanced to  the the terminal ileum, with                            identification of the appendiceal orifice and IC                            valve. The colonoscopy was performed without                            difficulty. The patient tolerated the procedure                            well. The quality of the bowel preparation was                            good. The terminal ileum, ileocecal valve,                            appendiceal orifice, and rectum were photographed. Scope In: 11:37:30 AM Scope Out: 11:58:52 AM Scope Withdrawal Time: 0 hours 18 minutes 21 seconds  Total Procedure Duration: 0 hours 21 minutes 22 seconds  Findings:                 The perianal and digital rectal examinations were                            normal.                           A 8 mm polyp was found in the proximal ascending                            colon. The polyp was sessile. The polyp was removed                            with a cold snare. Resection and retrieval were                            complete.  The colon was tortuous.                           The terminal ileum appeared normal.                           The exam was otherwise without abnormality on                            direct and retroflexion views. Complications:            No immediate complications. Estimated blood loss:                            Minimal. Estimated Blood Loss:     Estimated blood loss was minimal. Impression:               - One 8 mm polyp in the proximal ascending colon,                            removed with a cold snare. Resected and retrieved.                           - Tortuous colon.                           - The examined portion of the ileum was normal.                           - The examination was otherwise normal on direct                            and retroflexion views. Recommendation:           - Patient has a contact number available for                             emergencies. The signs and symptoms of potential                            delayed complications were discussed with the                            patient. Return to normal activities tomorrow.                            Written discharge instructions were provided to the                            patient.                           - Resume previous diet.                           - Continue present medications.                           -  Await pathology results.                           - Repeat colonoscopy is recommended for                            surveillance. The colonoscopy date will be                            determined after pathology results from today's                            exam become available for review. [name] P. Armbruster MD, MD 04/17/2017 12:05:38 PM This report has been signed electronically.

## 2017-04-18 ENCOUNTER — Telehealth: Payer: Self-pay

## 2017-04-18 NOTE — Telephone Encounter (Signed)
  Follow up Call-  Call Eugean Arnott number 04/17/2017  Post procedure Call Halen Antenucci phone  # [phone]  Permission to leave phone message Yes  Some recent data might be hidden     Patient questions:  Do you have a fever, pain , or abdominal swelling? No. Pain Score  0 *  Have you tolerated food without any problems? Yes.    Have you been able to return to your normal activities? Yes.    Do you have any questions about your discharge instructions: Diet   No. Medications  No. Follow up visit  No.  Do you have questions or concerns about your Care? No.  Actions: * If pain score is 4 or above: No action needed, pain <4.

## 2017-04-24 ENCOUNTER — Other Ambulatory Visit (HOSPITAL_BASED_OUTPATIENT_CLINIC_OR_DEPARTMENT_OTHER): Payer: Managed Care, Other (non HMO)

## 2017-04-24 ENCOUNTER — Ambulatory Visit (HOSPITAL_BASED_OUTPATIENT_CLINIC_OR_DEPARTMENT_OTHER): Payer: Managed Care, Other (non HMO)

## 2017-04-24 ENCOUNTER — Ambulatory Visit: Payer: Managed Care, Other (non HMO)

## 2017-04-24 VITALS — BP 103/49 | HR 63 | Temp 98.9°F | Resp 16

## 2017-04-24 DIAGNOSIS — Z170 Estrogen receptor positive status [ER+]: Principal | ICD-10-CM

## 2017-04-24 DIAGNOSIS — C50412 Malignant neoplasm of upper-outer quadrant of left female breast: Secondary | ICD-10-CM

## 2017-04-24 DIAGNOSIS — Z5112 Encounter for antineoplastic immunotherapy: Secondary | ICD-10-CM | POA: Diagnosis not present

## 2017-04-24 DIAGNOSIS — Z95828 Presence of other vascular implants and grafts: Secondary | ICD-10-CM

## 2017-04-24 LAB — CBC WITH DIFFERENTIAL/PLATELET
BASO%: 1.4 % (ref 0.0–2.0)
BASOS ABS: 0.1 10*3/uL (ref 0.0–0.1)
EOS%: 7.9 % — AB (ref 0.0–7.0)
Eosinophils Absolute: 0.3 10*3/uL (ref 0.0–0.5)
HEMATOCRIT: 36.5 % (ref 34.8–46.6)
HGB: 12.1 g/dL (ref 11.6–15.9)
LYMPH%: 28.7 % (ref 14.0–49.7)
MCH: 30.7 pg (ref 25.1–34.0)
MCHC: 33.2 g/dL (ref 31.5–36.0)
MCV: 92.6 fL (ref 79.5–101.0)
MONO#: 0.4 10*3/uL (ref 0.1–0.9)
MONO%: 9.3 % (ref 0.0–14.0)
NEUT#: 2.3 10*3/uL (ref 1.5–6.5)
NEUT%: 52.7 % (ref 38.4–76.8)
Platelets: 230 10*3/uL (ref 145–400)
RBC: 3.94 10*6/uL (ref 3.70–5.45)
RDW: 12.7 % (ref 11.2–14.5)
WBC: 4.3 10*3/uL (ref 3.9–10.3)
lymph#: 1.2 10*3/uL (ref 0.9–3.3)

## 2017-04-24 LAB — COMPREHENSIVE METABOLIC PANEL
ALBUMIN: 3.8 g/dL (ref 3.5–5.0)
ALK PHOS: 55 U/L (ref 40–150)
ALT: 11 U/L (ref 0–55)
AST: 17 U/L (ref 5–34)
Anion Gap: 8 mEq/L (ref 3–11)
BILIRUBIN TOTAL: 0.29 mg/dL (ref 0.20–1.20)
BUN: 10 mg/dL (ref 7.0–26.0)
CO2: 25 meq/L (ref 22–29)
CREATININE: 0.8 mg/dL (ref 0.6–1.1)
Calcium: 9.6 mg/dL (ref 8.4–10.4)
Chloride: 105 mEq/L (ref 98–109)
EGFR: >60 mL/min/{1.73_m2} (ref >60)
GLUCOSE: 80 mg/dL (ref 70–140)
Potassium: 4.6 mEq/L (ref 3.5–5.1)
SODIUM: 138 meq/L (ref 136–145)
TOTAL PROTEIN: 6.9 g/dL (ref 6.4–8.3)

## 2017-04-24 MED ORDER — DIPHENHYDRAMINE HCL 25 MG PO CAPS
25.0000 mg | ORAL_CAPSULE | Freq: Once | ORAL | Status: AC
  Administered 2017-04-24: 25 mg via ORAL

## 2017-04-24 MED ORDER — SODIUM CHLORIDE 0.9% FLUSH
10.0000 mL | INTRAVENOUS | Status: DC | PRN
  Administered 2017-04-24: 10 mL
  Filled 2017-04-24: qty 10

## 2017-04-24 MED ORDER — SODIUM CHLORIDE 0.9% FLUSH
10.0000 mL | Freq: Once | INTRAVENOUS | Status: AC
  Administered 2017-04-24: 10 mL
  Filled 2017-04-24: qty 10

## 2017-04-24 MED ORDER — SODIUM CHLORIDE 0.9 % IV SOLN
6.0000 mg/kg | Freq: Once | INTRAVENOUS | Status: AC
  Administered 2017-04-24: 399 mg via INTRAVENOUS
  Filled 2017-04-24: qty 19

## 2017-04-24 MED ORDER — SODIUM CHLORIDE 0.9 % IV SOLN
Freq: Once | INTRAVENOUS | Status: AC
  Administered 2017-04-24: 11:00:00 via INTRAVENOUS

## 2017-04-24 MED ORDER — ACETAMINOPHEN 325 MG PO TABS
ORAL_TABLET | ORAL | Status: AC
  Filled 2017-04-24: qty 2

## 2017-04-24 MED ORDER — ACETAMINOPHEN 325 MG PO TABS
650.0000 mg | ORAL_TABLET | Freq: Once | ORAL | Status: AC
  Administered 2017-04-24: 650 mg via ORAL

## 2017-04-24 MED ORDER — DIPHENHYDRAMINE HCL 25 MG PO CAPS
ORAL_CAPSULE | ORAL | Status: AC
  Filled 2017-04-24: qty 1

## 2017-04-24 MED ORDER — HEPARIN SOD (PORK) LOCK FLUSH 100 UNIT/ML IV SOLN
500.0000 [IU] | Freq: Once | INTRAVENOUS | Status: AC | PRN
  Administered 2017-04-24: 500 [IU]
  Filled 2017-04-24: qty 5

## 2017-04-24 NOTE — Patient Instructions (Signed)
[facility] Discharge Instructions for Patients Receiving Chemotherapy  Today you received the following chemotherapy agents Herceptin  To help prevent nausea and vomiting after your treatment, we encourage you to take your nausea medication as needed   If you develop nausea and vomiting that is not controlled by your nausea medication, call the clinic.   BELOW ARE SYMPTOMS THAT SHOULD BE REPORTED IMMEDIATELY:  *FEVER GREATER THAN 100.5 F  *CHILLS WITH OR WITHOUT FEVER  NAUSEA AND VOMITING THAT IS NOT CONTROLLED WITH YOUR NAUSEA MEDICATION  *UNUSUAL SHORTNESS OF BREATH  *UNUSUAL BRUISING OR BLEEDING  TENDERNESS IN MOUTH AND THROAT WITH OR WITHOUT PRESENCE OF ULCERS  *URINARY PROBLEMS  *BOWEL PROBLEMS  UNUSUAL RASH Items with * indicate a potential emergency and should be followed up as soon as possible.  Feel free to call the clinic should you have any questions or concerns. The clinic phone number is [phone].  Please show the [facility] at check-in to the Emergency Department and triage nurse.

## 2017-04-26 ENCOUNTER — Telehealth: Payer: Self-pay | Admitting: Oncology

## 2017-04-26 ENCOUNTER — Encounter: Payer: Self-pay | Admitting: Gastroenterology

## 2017-04-26 NOTE — Telephone Encounter (Signed)
Left message regarding appt and mailed schedule to patient.

## 2017-05-02 ENCOUNTER — Other Ambulatory Visit (HOSPITAL_COMMUNITY): Payer: Self-pay | Admitting: *Deleted

## 2017-05-02 ENCOUNTER — Ambulatory Visit (HOSPITAL_COMMUNITY)
Admission: RE | Admit: 2017-05-02 | Discharge: 2017-05-02 | Disposition: A | Discharge status: Home / Self Care | Payer: Managed Care, Other (non HMO) | Source: Ambulatory Visit | Attending: Osteopathic Medicine | Admitting: Osteopathic Medicine

## 2017-05-02 ENCOUNTER — Ambulatory Visit (HOSPITAL_BASED_OUTPATIENT_CLINIC_OR_DEPARTMENT_OTHER)
Admission: RE | Admit: 2017-05-02 | Discharge: 2017-05-02 | Disposition: A | Discharge status: Home / Self Care | Payer: Managed Care, Other (non HMO) | Source: Ambulatory Visit | Attending: Cardiology | Admitting: Cardiology

## 2017-05-02 VITALS — BP 104/64 | HR 66 | Wt 149.1 lb

## 2017-05-02 DIAGNOSIS — Z170 Estrogen receptor positive status [ER+]: Secondary | ICD-10-CM | POA: Diagnosis not present

## 2017-05-02 DIAGNOSIS — C50412 Malignant neoplasm of upper-outer quadrant of left female breast: Secondary | ICD-10-CM

## 2017-05-02 NOTE — Progress Notes (Signed)
Oncology: Dr. [name]  61 yo with history of breast cancer presents for cardio-oncology followup.  Left breast cancer was diagnosed in 2/18, ER+/PR-/HER2+.  She had lumpectomy in 3/18 followed by Abraxane 12 cycles.  She will get Herceptin x 1 year.    She has been doing well.  No exertional dyspnea or chest pain.  She will have her last Herceptin dose in 2/19.   PMH: 1. Breast cancer: Left breast cancer diagnosed in 2/18, ER+/PR-/HER2+.  She had lumpectomy in 3/18 followed by Abraxane 12 cycles.  She will get Herceptin x 1 year to start later this month.  - Echo (3/18): EF 60-65%, GLS -21.8%.  - Echo (6/18): EF 60-65%, GLS -23.5% - Echo (9/18): EF 94-49%, normal diastolic function, GLS -67.5%, normal RV size and systolic function.  - Echo (12/18): EF 55-60%, GLS -20.4%, normal RV size and systolic function.   Social History   Socioeconomic History  . Marital status: Married    Spouse name: Not on file  . Number of children: 1  . Years of education: Not on file  . Highest education level: Not on file  Social Needs  . Financial resource strain: Not on file  . Food insecurity - worry: Not on file  . Food insecurity - inability: Not on file  . Transportation needs - medical: Not on file  . Transportation needs - non-medical: Not on file  Occupational History  . Occupation: retired  Tobacco Use  . Smoking status: Never Smoker  . Smokeless tobacco: Never Used  Substance and Sexual Activity  . Alcohol use: Yes    Alcohol/week: 0.0 oz    Comment: 1-2  . Drug use: No  . Sexual activity: Not on file  Other Topics Concern  . Not on file  Social History Narrative   I biological son and 1 adopted daughter   Family History  Problem Relation Age of Onset  . Ovarian cancer Mother 39  . Hypertension Father   . Stroke Father   . Heart disease Father   . Breast cancer Maternal Aunt 77       recurred at 37  . Heart disease Paternal Grandmother   . Osteoporosis Sister   . Liver  cancer Maternal Uncle 77   ROS: All systems reviewed and negative except as per HPI.   Current Outpatient Medications  Medication Sig Dispense Refill  . anastrozole (ARIMIDEX) 1 MG tablet Take 1 tablet (1 mg total) by mouth daily. 30 tablet 12  . CALCIUM PO Take 500 mg by mouth 2 (two) times daily. Skeletal strength 342m Calium    . cetirizine (ZYRTEC) 10 MG tablet Take 10 mg by mouth daily.    . Cholecalciferol (VITAMIN D3) 1000 units CAPS Take 1 capsule by mouth daily.    . Cyanocobalamin (B-12) 2500 MCG TABS Take 1 tablet by mouth daily.     . diphenhydrAMINE (BENADRYL) 25 mg capsule Take 25 mg by mouth every 6 (six) hours as needed.    . hyaluronate sodium (RADIAPLEXRX) GEL Apply 1 application topically once.    . Lactobacillus (ACIDOPHILUS PROBIOTIC PO) Take by mouth daily.    .Marland Kitchenlidocaine-prilocaine (EMLA) cream Apply one application to port 1-2 hours prior to access. Cover with saran wrap. 30 g 3  . Multiple Vitamin (MULTIVITAMIN) tablet Take 1 tablet by mouth daily.    . non-metallic deodorant (Jethro Poling MISC Apply 1 application topically daily as needed.    . SYMBICORT 160-4.5 MCG/ACT inhaler INL 2 PFS ITL BID  1   No current facility-administered medications for this encounter.    BP 104/64   Pulse 66   Wt 149 lb 1.9 oz (67.6 kg)   SpO2 100%   BMI 25.01 kg/m  General: NAD Neck: No JVD, no thyromegaly or thyroid nodule.  Lungs: Clear to auscultation bilaterally with normal respiratory effort. CV: Nondisplaced PMI.  Heart regular S1/S2, no S3/S4, no murmur.  No peripheral edema.  No carotid bruit.  Normal pedal pulses.  Abdomen: Soft, nontender, no hepatosplenomegaly, no distention.  Skin: Intact without lesions or rashes.  Neurologic: Alert and oriented x 3.  Psych: Normal affect. Extremities: No clubbing or cyanosis.  HEENT: Normal.   Assessment/Plan: 61 yo with breast cancer, ER+/PR-/HER2+. She will be on Herceptin until 2/19. I reviewed today's echo, EF and strain  pattern look normal.  She will return in 3 months for an echo, this will likely be her last study if it is normal.  Loralie Champagne 05/02/2017

## 2017-05-02 NOTE — Patient Instructions (Signed)
Follow up and Echo with Dr.McLean in 3months  

## 2017-05-09 LAB — T4, FREE: Free T4: 1.1 ng/dL (ref 0.8–1.8)

## 2017-05-09 LAB — TSH: TSH: 3.03 mIU/L (ref 0.40–4.50)

## 2017-05-15 ENCOUNTER — Ambulatory Visit (HOSPITAL_BASED_OUTPATIENT_CLINIC_OR_DEPARTMENT_OTHER): Payer: Managed Care, Other (non HMO)

## 2017-05-15 ENCOUNTER — Other Ambulatory Visit (HOSPITAL_BASED_OUTPATIENT_CLINIC_OR_DEPARTMENT_OTHER): Payer: Managed Care, Other (non HMO)

## 2017-05-15 ENCOUNTER — Ambulatory Visit: Payer: Managed Care, Other (non HMO)

## 2017-05-15 VITALS — BP 112/54 | HR 63 | Temp 97.7°F | Resp 17 | Wt 152.0 lb

## 2017-05-15 DIAGNOSIS — Z170 Estrogen receptor positive status [ER+]: Principal | ICD-10-CM

## 2017-05-15 DIAGNOSIS — C50412 Malignant neoplasm of upper-outer quadrant of left female breast: Secondary | ICD-10-CM | POA: Diagnosis not present

## 2017-05-15 DIAGNOSIS — Z5112 Encounter for antineoplastic immunotherapy: Secondary | ICD-10-CM

## 2017-05-15 DIAGNOSIS — Z95828 Presence of other vascular implants and grafts: Secondary | ICD-10-CM

## 2017-05-15 LAB — CBC WITH DIFFERENTIAL/PLATELET
BASO%: 0.7 % (ref 0.0–2.0)
Basophils Absolute: 0 10*3/uL (ref 0.0–0.1)
EOS%: 4.3 % (ref 0.0–7.0)
Eosinophils Absolute: 0.2 10*3/uL (ref 0.0–0.5)
HCT: 34.6 % — ABNORMAL LOW (ref 34.8–46.6)
HGB: 11.6 g/dL (ref 11.6–15.9)
LYMPH#: 1.3 10*3/uL (ref 0.9–3.3)
LYMPH%: 22.2 % (ref 14.0–49.7)
MCH: 31 pg (ref 25.1–34.0)
MCHC: 33.5 g/dL (ref 31.5–36.0)
MCV: 92.5 fL (ref 79.5–101.0)
MONO#: 0.5 10*3/uL (ref 0.1–0.9)
MONO%: 9.1 % (ref 0.0–14.0)
NEUT#: 3.6 10*3/uL (ref 1.5–6.5)
NEUT%: 63.7 % (ref 38.4–76.8)
Platelets: 231 10*3/uL (ref 145–400)
RBC: 3.74 10*6/uL (ref 3.70–5.45)
RDW: 13.2 % (ref 11.2–14.5)
WBC: 5.6 10*3/uL (ref 3.9–10.3)

## 2017-05-15 LAB — COMPREHENSIVE METABOLIC PANEL
ALT: 10 U/L (ref 0–55)
AST: 17 U/L (ref 5–34)
Albumin: 3.8 g/dL (ref 3.5–5.0)
Alkaline Phosphatase: 53 U/L (ref 40–150)
Anion Gap: 9 mEq/L (ref 3–11)
BUN: 11.5 mg/dL (ref 7.0–26.0)
CHLORIDE: 106 meq/L (ref 98–109)
CO2: 25 meq/L (ref 22–29)
CREATININE: 0.8 mg/dL (ref 0.6–1.1)
Calcium: 9.3 mg/dL (ref 8.4–10.4)
EGFR: >60 mL/min/{1.73_m2} (ref >60)
Glucose: 94 mg/dl (ref 70–140)
POTASSIUM: 4.3 meq/L (ref 3.5–5.1)
SODIUM: 140 meq/L (ref 136–145)
Total Bilirubin: 0.36 mg/dL (ref 0.20–1.20)
Total Protein: 6.5 g/dL (ref 6.4–8.3)

## 2017-05-15 MED ORDER — HEPARIN SOD (PORK) LOCK FLUSH 100 UNIT/ML IV SOLN
500.0000 [IU] | Freq: Once | INTRAVENOUS | Status: AC | PRN
  Administered 2017-05-15: 500 [IU]
  Filled 2017-05-15: qty 5

## 2017-05-15 MED ORDER — DIPHENHYDRAMINE HCL 25 MG PO CAPS
25.0000 mg | ORAL_CAPSULE | Freq: Once | ORAL | Status: AC
  Administered 2017-05-15: 25 mg via ORAL

## 2017-05-15 MED ORDER — SODIUM CHLORIDE 0.9 % IV SOLN
Freq: Once | INTRAVENOUS | Status: AC
  Administered 2017-05-15: 11:00:00 via INTRAVENOUS

## 2017-05-15 MED ORDER — SODIUM CHLORIDE 0.9 % IV SOLN
6.0000 mg/kg | Freq: Once | INTRAVENOUS | Status: AC
  Administered 2017-05-15: 399 mg via INTRAVENOUS
  Filled 2017-05-15: qty 19

## 2017-05-15 MED ORDER — ACETAMINOPHEN 325 MG PO TABS
650.0000 mg | ORAL_TABLET | Freq: Once | ORAL | Status: AC
  Administered 2017-05-15: 650 mg via ORAL

## 2017-05-15 MED ORDER — SODIUM CHLORIDE 0.9% FLUSH
10.0000 mL | INTRAVENOUS | Status: DC | PRN
  Administered 2017-05-15: 10 mL
  Filled 2017-05-15: qty 10

## 2017-05-15 MED ORDER — ACETAMINOPHEN 325 MG PO TABS
ORAL_TABLET | ORAL | Status: AC
  Filled 2017-05-15: qty 2

## 2017-05-15 MED ORDER — DIPHENHYDRAMINE HCL 25 MG PO CAPS
ORAL_CAPSULE | ORAL | Status: AC
  Filled 2017-05-15: qty 1

## 2017-05-15 MED ORDER — SODIUM CHLORIDE 0.9% FLUSH
10.0000 mL | Freq: Once | INTRAVENOUS | Status: AC
  Administered 2017-05-15: 10 mL
  Filled 2017-05-15: qty 10

## 2017-06-04 NOTE — Progress Notes (Signed)
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 This showed a possible mass in the left breast. On 07/02/2016 she underwent left diagnostic mammography with tomography and left breast ultrasonography. The breast density was category C. In the left breast upper outer quadrant there was a microlobulated mass which was not directly palpable, although there was slight thickening in the left breast 1:00 position. Targeted ultrasonography confirmed a solid hypoechoic microlobulated mass in the left breast 1:00 radiant 4 cm from the nipple, measuring 0.8 cm.  On 07/03/2016 [name] underwent biopsy of the left breast mass in question, and this showed ([id number]) invasive ductal carcinoma, grade 2 or 3, with extracellular mucin, estrogen receptor 5% positive with weak staining intensity, progesterone receptor negative, with an MIB-1 of 50%, and HER-2 amplified, the signals ratio being 5.71 and the number per cell 14.55.  Her subsequent history is as detailed below  INTERVAL HISTORY: Hildy returns today for follow-up of her estrogen receptor positive and HER-2 amplified breast cancer  accompanied by her husband. She continues on anastrozole, with good tolerance. She denies any issues with hot flashes or vaginal dryness. She notes that she has chronic lower back pain. She notes that she it freezes when she sits for a long time. She notes that she tries to loosen it up at the gym.   Since her last visit here she had a repeat colonoscopy on 04/17/2017 under Dr. Havery Moros.  There was a single hyperplastic polyp with no evidence of dysplasia or malignancy (W [id number]). She notes that the colonoscopy went fine. She notes her next one will be in 5 years.   She continues on trastuzumab every 3 weeks, with a dose due today.  Her final dose will be on 07/17/2017. She tolerates this well. She notes that she gets sleepy towards the afternoon from taking benadryl. She would like to reduce the dosage.    REVIEW OF SYSTEMS: Dondra reports that she is doing well. She notes that she goes to the gym almost everyday. She takes classes and does yoga, lifts weights and walks. She also exercises on the elliptical. She notes that she avoids core exercises, but realizes that it will benefit her back pain. She denies unusual headaches, visual changes, nausea, vomiting, or dizziness. There has been no unusual cough, phlegm production, or pleurisy. This been no change in bowel or bladder habits. She denies unexplained fatigue or unexplained weight loss, bleeding, rash, or fever. A detailed review of systems was otherwise stable.    PAST MEDICAL HISTORY: Past Medical History:  Diagnosis Date  . Allergy   . Anxiety   . Arthritis    feet  . Basal cell carcinoma   . Breast cancer ([place]) 06/2016   left breast  . Colon polyps   . Eczema   . Genetic testing 08/15/2016  Ms. Hanisch underwent genetic testing for hereditary cancer syndrome through [facility]'s 43-gene Common Hereditary Cancers Panel. Ms. 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 Result report is dated 08/15/2016. Please see genetic counseling documentation from 08/17/2016 for further discussion.  [name] History of radiation therapy 01/02/17-01/31/17   left breast was treated to 42.72 Gy in 16 fractions, left breast boost 10 Gy in 5 fractions  . PONV (postoperative nausea and vomiting)     PAST SURGICAL HISTORY: Past Surgical History:  Procedure Laterality Date  . BONE SPUR Bilateral 2001 AND 1988  . BREAST LUMPECTOMY WITH RADIOACTIVE SEED AND SENTINEL LYMPH NODE BIOPSY Left 07/26/2016   Procedure: BREAST LUMPECTOMY WITH RADIOACTIVE SEED AND SENTINEL LYMPH NODE BIOPSY;  Surgeon: [name], MD;  Location: [place];  Service: General;  Laterality: Left;  . BUNIONECTOMY Bilateral 02/2012  . COLONOSCOPY W/ POLYPECTOMY  08/2007  . DILATION AND CURETTAGE OF UTERUS  2002  . MOHS SURGERY  2010  . PORTACATH PLACEMENT Right 07/26/2016   Procedure: INSERTION PORT-A-CATH;  Surgeon: [name], MD;  Location: [place];  Service: General;  Laterality: Right;    FAMILY HISTORY Family History  Problem Relation Age of Onset  . Ovarian cancer Mother 30  . Hypertension Father   . Stroke Father   . Heart disease Father   . Breast cancer Maternal Aunt 77       recurred at 66  . Heart disease Paternal Grandmother   . Osteoporosis Sister   . Liver cancer Maternal Uncle 29  The patient's father died at age 84, with some form of skin cancer, most likely melanoma. The patient's mother died at the age of 70 with ovarian cancer, which had been diagnosed a few months prior. The patient had no brothers, 1 sister. A maternal aunt was diagnosed with breast cancer at age 11, recurrent age 29.  GYNECOLOGIC HISTORY:  No LMP recorded. Patient is postmenopausal. Menarche age 16, the patient is GX P0. She stopped having periods approximately age 54. 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 Her husband [name] worked as a Dance movement psychotherapist for a [employer]. Their last name is pronounced foh-TEE-ah and they tell me it means "burning" in [place]. Their children are adopted. [name] lives in [place] and works in Press photographer, and Brooks lives in [place] and is an Scientist, water quality. The patient has 3 grandchildren. She is not a Ambulance person.    ADVANCED DIRECTIVES: In place   HEALTH MAINTENANCE: Social History   Tobacco Use  . Smoking status: Never Smoker  . Smokeless tobacco: Never Used  Substance Use Topics  . Alcohol use: Yes    Alcohol/week: 0.0 oz    Comment: 1-2  . Drug use: No     Colonoscopy:2009  WNU:UVOZDG 2018  Bone density: 02/26/2017 T-score of -2.0 at the left femur neck    Allergies  Allergen Reactions  . Codeine Nausea Only and Other (See Comments)    Dizzy  . Sulfamethoxazole Nausea And Vomiting  . Adhesive [Tape]     Current Outpatient Medications  Medication Sig Dispense Refill  . anastrozole (ARIMIDEX) 1 MG tablet Take 1 tablet (1 mg total) by mouth daily. 30 tablet 12  . CALCIUM PO Take 500 mg by mouth 2 (two) times daily. Skeletal strength 385m Calium    . cetirizine (  ZYRTEC) 10 MG tablet Take 10 mg by mouth daily.    . Cholecalciferol (VITAMIN D3) 1000 units CAPS Take 1 capsule by mouth daily.    . Cyanocobalamin (B-12) 2500 MCG TABS Take 1 tablet by mouth daily.     . diphenhydrAMINE (BENADRYL) 25 mg capsule Take 25 mg by mouth every 6 (six) hours as needed.    . hyaluronate sodium (RADIAPLEXRX) GEL Apply 1 application topically once.    . Lactobacillus (ACIDOPHILUS PROBIOTIC PO) Take by mouth daily.    [name] lidocaine-prilocaine (EMLA) cream Apply one application to port 1-2 hours prior to access. Cover with saran wrap. 30 g 3  . Multiple Vitamin (MULTIVITAMIN) tablet Take 1 tablet by mouth daily.    . non-metallic deodorant Jethro Poling) MISC Apply 1 application topically daily as needed.    . SYMBICORT 160-4.5 MCG/ACT inhaler INL 2 PFS  ITL BID  1   No current facility-administered medications for this visit.     OBJECTIVE: Middle-aged white woman in no acute distress  Vitals:   06/05/17 1010  BP: 118/61  Pulse: 62  Resp: 17  Temp: 97.9 F (36.6 C)  SpO2: 100%     Body mass index is 25.44 kg/m.   Filed Weights   06/05/17 1010  Weight: 151 lb 11.2 oz (68.8 kg)     ECOG FS:0 - Asymptomatic  Sclerae unicteric, EOMs intact Oropharynx clear and moist No cervical or supraclavicular adenopathy Lungs no rales or rhonchi Heart regular rate and rhythm Abd soft, nontender, positive bowel sounds MSK no focal spinal tenderness, no upper extremity lymphedema Neuro: nonfocal, well oriented, appropriate affect Breasts: Unremarkable right breast.  Left breast status post lumpectomy and radiation.  Minimal variation in nipple shape, but no evidence of local recurrence.  Both axillae are benign  LAB RESULTS:  CMP     Component Value Date/Time   NA 141 06/05/2017 0918   NA 140 05/15/2017 0920   K 4.7 06/05/2017 0918   K 4.3 05/15/2017 0920   CL 106 06/05/2017 0918   CO2 28 06/05/2017 0918   CO2 25 05/15/2017 0920   GLUCOSE 85 06/05/2017 0918   GLUCOSE 94 05/15/2017 0920   BUN 10 06/05/2017 0918   BUN 11.5 05/15/2017 0920   CREATININE 0.82 06/05/2017 0918   CREATININE 0.8 05/15/2017 0920   CALCIUM 9.4 06/05/2017 0918   CALCIUM 9.3 05/15/2017 0920   PROT 6.6 06/05/2017 0918   PROT 6.5 05/15/2017 0920   ALBUMIN 3.9 06/05/2017 0918   ALBUMIN 3.8 05/15/2017 0920   AST 17 06/05/2017 0918   AST 17 05/15/2017 0920   ALT 14 06/05/2017 0918   ALT 10 05/15/2017 0920   ALKPHOS 53 06/05/2017 0918   ALKPHOS 53 05/15/2017 0920   BILITOT 0.4 06/05/2017 0918   BILITOT 0.36 05/15/2017 0920   GFRNONAA >60 06/05/2017 0918   GFRNONAA 72 03/25/2017 0816   GFRAA >60 06/05/2017 0918   GFRAA 84 03/25/2017 0816    INo results found for: SPEP, UPEP  Lab Results  Component Value Date   WBC 4.4 06/05/2017   NEUTROABS 2.8  06/05/2017   HGB 11.7 06/05/2017   HCT 35.1 06/05/2017   MCV 92.4 06/05/2017   PLT 270 06/05/2017      Chemistry      Component Value Date/Time   NA 141 06/05/2017 0918   NA 140 05/15/2017 0920   K 4.7 06/05/2017 0918   K 4.3 05/15/2017 0920   CL 106 06/05/2017 0918   CO2 28  06/05/2017 0918   CO2 25 05/15/2017 0920   BUN 10 06/05/2017 0918   BUN 11.5 05/15/2017 0920   CREATININE 0.82 06/05/2017 0918   CREATININE 0.8 05/15/2017 0920   GLU 73 08/02/2014      Component Value Date/Time   CALCIUM 9.4 06/05/2017 0918   CALCIUM 9.3 05/15/2017 0920   ALKPHOS 53 06/05/2017 0918   ALKPHOS 53 05/15/2017 0920   AST 17 06/05/2017 0918   AST 17 05/15/2017 0920   ALT 14 06/05/2017 0918   ALT 10 05/15/2017 0920   BILITOT 0.4 06/05/2017 0918   BILITOT 0.36 05/15/2017 0920       No results found for: LABCA2  No components found for: LABCA125  No results for input(s): INR in the last 168 hours.  Urinalysis    Component Value Date/Time   COLORURINE COLORLESS (A) 12/22/2016 2105   APPEARANCEUR CLEAR 12/22/2016 2105   LABSPEC 1.005 12/22/2016 2105   PHURINE 7.0 12/22/2016 2105   GLUCOSEU NEGATIVE 12/22/2016 2105   HGBUR NEGATIVE 12/22/2016 2105   [place] NEGATIVE 12/22/2016 2105   [place] NEGATIVE 12/22/2016 2105   PROTEINUR NEGATIVE 12/22/2016 2105   NITRITE NEGATIVE 12/22/2016 2105   LEUKOCYTESUR NEGATIVE 12/22/2016 2105     STUDIES: Final echocardiogram is already scheduled for mid March  ELIGIBLE FOR AVAILABLE RESEARCH PROTOCOL: no  ASSESSMENT: 62 y.o. [place] Ser, [name] woman status post biopsy of the left breast upper outer quadrant lesion 07/03/2016 showing a clinical T1b N0, stage 1B invasive ductal carcinoma, grade 2 or 3, estrogen receptor weakly positive at 5%, progesterone receptor negative, but HER-2 strongly amplified, with an MIB-1 of 50%.  (1) status post left lumpectomy with sentinel lymph node sampling 07/26/2016 for a pT1c pN0, stage IA invasive  ductal carcinoma, grade 3, with extracellular mucin, and negative margins  (2) chemotherapy consisting of Paclitaxel weekly starting 09/18/2016, discontinued after one cycle due to peripheral neuropathy.   (a) Gemcitabine and Carboplatin started 10/02/2016, repeated days 1 and 8 of each 21 day cycle to a total of 8 doses (4 cycles)-- final dose 12/18/2016  (3) trastuzumab started 08/07/2016, to continue for 12 months  (a) baseline echocardiogram 07/25/2016 shows an ejection fraction of 60-65%  (b) repeat echocardiogram 10/22/2016 shows stable ejection fraction  (c) echocardiogram January 24, 2017 found an ejection fraction in the 55-60% range.  (d) echocardiogram 05/02/2017 showed an ejection fraction of 55-60%  (4) adjuvant radiation 01/02/2017-01/31/2017:  Left breast was treated to 42.72 Gy in 16 fractions and boosted to 10 Gy in 5 fractions.  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   (a) associated with increased colorectal cancer risk, possibly breast cancer (at least in [place])  (b) colonoscopy 09/11/2007/ Ezzie Dural  (c) colonoscopy 04/17/2017/ Armbruster     PLAN: Toshie is now nearly a year out from definitive surgery for her breast cancer with no evidence of disease recurrence.  This is favorable.  She is tolerating anastrozole well, and the plan will be to continue that for a total of 5 years.  She will have her next bone density October 2020.  She will complete her trastuzumab treatments 07/17/2017. She is already scheduled for a follow-up echo.  She can  have her port removed after that.  I will see her with her last herceptin  treatment, then in Sept, then in March of next year and thereafter yearly.  She had many concerns today regarding reurrence, though her prognosis is very good. She has an excellent exercise program and diet.  I think she would benefit from the Finding Your New Normal group and gave her the information on how to enroll.  She knows to call for any problem that may develop before the next visit   Karyss Frese, [name], MD  06/05/17 10:23 AM Medical Oncology and Hematology [facility] [place], [place] [address] Tel. [phone]    Fax. [phone]  This document serves as a record of services personally performed by Lurline Del, MD. It was created on his behalf by Sheron Nightingale, a trained medical scribe. The creation of this record is based on the scribe's personal observations and the provider's statements to them.   I have reviewed the above documentation for accuracy and completeness, and I agree with the above.

## 2017-06-05 ENCOUNTER — Telehealth: Payer: Self-pay | Admitting: Oncology

## 2017-06-05 ENCOUNTER — Inpatient Hospital Stay: Payer: BLUE CROSS/BLUE SHIELD

## 2017-06-05 ENCOUNTER — Other Ambulatory Visit: Payer: Self-pay | Admitting: General Surgery

## 2017-06-05 ENCOUNTER — Inpatient Hospital Stay: Payer: BLUE CROSS/BLUE SHIELD | Attending: Oncology

## 2017-06-05 ENCOUNTER — Inpatient Hospital Stay (HOSPITAL_BASED_OUTPATIENT_CLINIC_OR_DEPARTMENT_OTHER): Payer: BLUE CROSS/BLUE SHIELD | Admitting: Oncology

## 2017-06-05 VITALS — BP 118/61 | HR 62 | Temp 97.9°F | Resp 17 | Ht 64.75 in | Wt 151.7 lb

## 2017-06-05 DIAGNOSIS — Z1509 Genetic susceptibility to other malignant neoplasm: Secondary | ICD-10-CM

## 2017-06-05 DIAGNOSIS — Z803 Family history of malignant neoplasm of breast: Secondary | ICD-10-CM

## 2017-06-05 DIAGNOSIS — Z5112 Encounter for antineoplastic immunotherapy: Secondary | ICD-10-CM | POA: Insufficient documentation

## 2017-06-05 DIAGNOSIS — C50412 Malignant neoplasm of upper-outer quadrant of left female breast: Secondary | ICD-10-CM

## 2017-06-05 DIAGNOSIS — Z95828 Presence of other vascular implants and grafts: Secondary | ICD-10-CM

## 2017-06-05 DIAGNOSIS — Z170 Estrogen receptor positive status [ER+]: Secondary | ICD-10-CM

## 2017-06-05 DIAGNOSIS — Z923 Personal history of irradiation: Secondary | ICD-10-CM

## 2017-06-05 LAB — CBC WITH DIFFERENTIAL/PLATELET
BASOS PCT: 1 %
Basophils Absolute: 0 10*3/uL (ref 0.0–0.1)
Eosinophils Absolute: 0.2 10*3/uL (ref 0.0–0.5)
Eosinophils Relative: 4 %
HEMATOCRIT: 35.1 % (ref 34.8–46.6)
HEMOGLOBIN: 11.7 g/dL (ref 11.6–15.9)
LYMPHS ABS: 1 10*3/uL (ref 0.9–3.3)
Lymphocytes Relative: 22 %
MCH: 30.8 pg (ref 25.1–34.0)
MCHC: 33.3 g/dL (ref 31.5–36.0)
MCV: 92.4 fL (ref 79.5–101.0)
MONOS PCT: 9 %
Monocytes Absolute: 0.4 10*3/uL (ref 0.1–0.9)
NEUTROS ABS: 2.8 10*3/uL (ref 1.5–6.5)
NEUTROS PCT: 64 %
Platelets: 270 10*3/uL (ref 145–400)
RBC: 3.8 MIL/uL (ref 3.70–5.45)
RDW: 14.3 % (ref 11.2–16.1)
WBC: 4.4 10*3/uL (ref 3.9–10.3)

## 2017-06-05 LAB — COMPREHENSIVE METABOLIC PANEL
ALT: 14 U/L (ref 0–55)
ANION GAP: 7 (ref 3–11)
AST: 17 U/L (ref 5–34)
Albumin: 3.9 g/dL (ref 3.5–5.0)
Alkaline Phosphatase: 53 U/L (ref 40–150)
BUN: 10 mg/dL (ref 7–26)
CHLORIDE: 106 mmol/L (ref 98–109)
CO2: 28 mmol/L (ref 22–29)
Calcium: 9.4 mg/dL (ref 8.4–10.4)
Creatinine, Ser: 0.82 mg/dL (ref 0.60–1.10)
Glucose, Bld: 85 mg/dL (ref 70–140)
Potassium: 4.7 mmol/L (ref 3.3–4.7)
SODIUM: 141 mmol/L (ref 136–145)
Total Bilirubin: 0.4 mg/dL (ref 0.2–1.2)
Total Protein: 6.6 g/dL (ref 6.4–8.3)

## 2017-06-05 MED ORDER — ACETAMINOPHEN 325 MG PO TABS
ORAL_TABLET | ORAL | Status: AC
  Filled 2017-06-05: qty 2

## 2017-06-05 MED ORDER — SODIUM CHLORIDE 0.9% FLUSH
10.0000 mL | Freq: Once | INTRAVENOUS | Status: AC
  Administered 2017-06-05: 10 mL
  Filled 2017-06-05: qty 10

## 2017-06-05 MED ORDER — TRASTUZUMAB CHEMO 150 MG IV SOLR
6.0000 mg/kg | Freq: Once | INTRAVENOUS | Status: AC
  Administered 2017-06-05: 399 mg via INTRAVENOUS
  Filled 2017-06-05: qty 19

## 2017-06-05 MED ORDER — DIPHENHYDRAMINE HCL 25 MG PO CAPS
ORAL_CAPSULE | ORAL | Status: AC
  Filled 2017-06-05: qty 1

## 2017-06-05 MED ORDER — DIPHENHYDRAMINE HCL 25 MG PO CAPS
25.0000 mg | ORAL_CAPSULE | Freq: Once | ORAL | Status: AC
  Administered 2017-06-05: 25 mg via ORAL

## 2017-06-05 MED ORDER — ACETAMINOPHEN 325 MG PO TABS
650.0000 mg | ORAL_TABLET | Freq: Once | ORAL | Status: AC
  Administered 2017-06-05: 650 mg via ORAL

## 2017-06-05 MED ORDER — SODIUM CHLORIDE 0.9% FLUSH
10.0000 mL | INTRAVENOUS | Status: DC | PRN
  Administered 2017-06-05: 10 mL
  Filled 2017-06-05: qty 10

## 2017-06-05 MED ORDER — SODIUM CHLORIDE 0.9 % IV SOLN
Freq: Once | INTRAVENOUS | Status: AC
  Administered 2017-06-05: 11:00:00 via INTRAVENOUS

## 2017-06-05 MED ORDER — HEPARIN SOD (PORK) LOCK FLUSH 100 UNIT/ML IV SOLN
500.0000 [IU] | Freq: Once | INTRAVENOUS | Status: AC | PRN
  Administered 2017-06-05: 500 [IU]
  Filled 2017-06-05: qty 5

## 2017-06-05 NOTE — Patient Instructions (Signed)
Implanted Port Home Guide An implanted port is a type of central line that is placed under the skin. Central lines are used to provide IV access when treatment or nutrition needs to be given through a person's veins. Implanted ports are used for long-term IV access. An implanted port may be placed because:  You need IV medicine that would be irritating to the small veins in your hands or arms.  You need long-term IV medicines, such as antibiotics.  You need IV nutrition for a long period.  You need frequent blood draws for lab tests.  You need dialysis.  Implanted ports are usually placed in the chest area, but they can also be placed in the upper arm, the abdomen, or the leg. An implanted port has two main parts:  Reservoir. The reservoir is round and will appear as a small, raised area under your skin. The reservoir is the part where a needle is inserted to give medicines or draw blood.  Catheter. The catheter is a thin, flexible tube that extends from the reservoir. The catheter is placed into a large vein. Medicine that is inserted into the reservoir goes into the catheter and then into the vein.  How will I care for my incision site? Do not get the incision site wet. Bathe or shower as directed by your health care provider. How is my port accessed? Special steps must be taken to access the port:  Before the port is accessed, a numbing cream can be placed on the skin. This helps numb the skin over the port site.  Your health care provider uses a sterile technique to access the port. ? Your health care provider must put on a mask and sterile gloves. ? The skin over your port is cleaned carefully with an antiseptic and allowed to dry. ? The port is gently pinched between sterile gloves, and a needle is inserted into the port.  Only "non-coring" port needles should be used to access the port. Once the port is accessed, a blood return should be checked. This helps ensure that the port  is in the vein and is not clogged.  If your port needs to remain accessed for a constant infusion, a clear (transparent) bandage will be placed over the needle site. The bandage and needle will need to be changed every week, or as directed by your health care provider.  Keep the bandage covering the needle clean and dry. Do not get it wet. Follow your health care provider's instructions on how to take a shower or bath while the port is accessed.  If your port does not need to stay accessed, no bandage is needed over the port.  What is flushing? Flushing helps keep the port from getting clogged. Follow your health care provider's instructions on how and when to flush the port. Ports are usually flushed with saline solution or a medicine called heparin. The need for flushing will depend on how the port is used.  If the port is used for intermittent medicines or blood draws, the port will need to be flushed: ? After medicines have been given. ? After blood has been drawn. ? As part of routine maintenance.  If a constant infusion is running, the port may not need to be flushed.  How long will my port stay implanted? The port can stay in for as long as your health care provider thinks it is needed. When it is time for the port to come out, surgery will be   done to remove it. The procedure is similar to the one performed when the port was put in. When should I seek immediate medical care? When you have an implanted port, you should seek immediate medical care if:  You notice a bad smell coming from the incision site.  You have swelling, redness, or drainage at the incision site.  You have more swelling or pain at the port site or the surrounding area.  You have a fever that is not controlled with medicine.  This information is not intended to replace advice given to you by your health care provider. Make sure you discuss any questions you have with your health care provider. Document  Released: 05/07/2005 Document Revised: 10/13/2015 Document Reviewed: 01/12/2013 Elsevier Interactive Patient Education  2017 Elsevier Inc.  

## 2017-06-05 NOTE — Patient Instructions (Signed)
[facility] Discharge Instructions for Patients Receiving Chemotherapy  Today you received the following chemotherapy agents Herceptin  To help prevent nausea and vomiting after your treatment, we encourage you to take your nausea medication as directed   If you develop nausea and vomiting that is not controlled by your nausea medication, call the clinic.   BELOW ARE SYMPTOMS THAT SHOULD BE REPORTED IMMEDIATELY:  *FEVER GREATER THAN 100.5 F  *CHILLS WITH OR WITHOUT FEVER  NAUSEA AND VOMITING THAT IS NOT CONTROLLED WITH YOUR NAUSEA MEDICATION  *UNUSUAL SHORTNESS OF BREATH  *UNUSUAL BRUISING OR BLEEDING  TENDERNESS IN MOUTH AND THROAT WITH OR WITHOUT PRESENCE OF ULCERS  *URINARY PROBLEMS  *BOWEL PROBLEMS  UNUSUAL RASH Items with * indicate a potential emergency and should be followed up as soon as possible.  Feel free to call the clinic should you have any questions or concerns. The clinic phone number is [phone].  Please show the CHEMO ALERT CARD at check-in to the Emergency Department and triage nurse.   

## 2017-06-05 NOTE — Telephone Encounter (Signed)
No 1/16 los.  

## 2017-06-13 ENCOUNTER — Other Ambulatory Visit: Payer: Self-pay | Admitting: General Surgery

## 2017-06-13 DIAGNOSIS — C50412 Malignant neoplasm of upper-outer quadrant of left female breast: Secondary | ICD-10-CM | POA: Diagnosis not present

## 2017-06-13 DIAGNOSIS — Z170 Estrogen receptor positive status [ER+]: Secondary | ICD-10-CM | POA: Diagnosis not present

## 2017-06-18 ENCOUNTER — Telehealth: Payer: Self-pay | Admitting: Osteopathic Medicine

## 2017-06-18 NOTE — Telephone Encounter (Signed)
Forwarding to provider for review.

## 2017-06-18 NOTE — Telephone Encounter (Signed)
Patient is requesting a prescription for 1 compression sleeve and 1 compression glove for her left arm. She is also wanting the procedure codes for these items in order find out if they are covered through her insurance. Please advise patient. Thanks!

## 2017-06-19 ENCOUNTER — Encounter: Payer: Self-pay | Admitting: Oncology

## 2017-06-19 NOTE — Progress Notes (Signed)
Patient returned call. Advised of approval for Herceptin copay assistance through [place]. Asked if she could provide me with a copy of approval letter once she receives by email, mail, or bring in if received by her next appointment. She verbalized understanding. She has my name and number for any additional financial questions or concerns.

## 2017-06-19 NOTE — Progress Notes (Signed)
Enrolled patient into [employer] program for assistance with Herceptin per a voicemail she left regarding assistance. Patient will receive a welcome letter in the mail. I will ask her to provide a copy once she receives it.  Called patient to advise of approval, no answer but left a voicemail for her to return my call with my contact name and number.

## 2017-06-20 MED ORDER — AMBULATORY NON FORMULARY MEDICATION: 99 refills | Status: DC

## 2017-06-20 NOTE — Telephone Encounter (Signed)
Compression rx placed in folder at front desk for pick up today.

## 2017-06-20 NOTE — Telephone Encounter (Signed)
Left a brief vm msg for pt regarding provider's note. Call back info provided.

## 2017-06-20 NOTE — Telephone Encounter (Signed)
Ok. It's a supply not a procedure, unless we apply them in the office. I'd have her clarify with the medical supply company or pharmacy. It should be covered, if any other paperwork needed for authorization, pharmacy or med supply company should be able to send this to [place].

## 2017-06-20 NOTE — Telephone Encounter (Signed)
Spoke to pt - as per pt, she called her ins carrier and was advised she will need to request procedure codes from pcp. Pt will stop by office to pick up the prescription.

## 2017-06-20 NOTE — Telephone Encounter (Signed)
Would have her contact her insurance - I'm not aware of codes for these materials as procedures. A medical supply company may also know, pr can tell her how much these would cost out of pocket.   Prescription is written but I need to know where to send it - please confirm pharmacy or medical supply company with the patient os we can fax this order, or she can pick up printed Rx.

## 2017-06-26 ENCOUNTER — Inpatient Hospital Stay: Payer: BLUE CROSS/BLUE SHIELD

## 2017-06-26 ENCOUNTER — Inpatient Hospital Stay: Payer: BLUE CROSS/BLUE SHIELD | Attending: Oncology

## 2017-06-26 VITALS — BP 98/66 | HR 61 | Temp 97.6°F | Resp 17

## 2017-06-26 DIAGNOSIS — Z79899 Other long term (current) drug therapy: Secondary | ICD-10-CM | POA: Insufficient documentation

## 2017-06-26 DIAGNOSIS — Z85828 Personal history of other malignant neoplasm of skin: Secondary | ICD-10-CM | POA: Insufficient documentation

## 2017-06-26 DIAGNOSIS — C50412 Malignant neoplasm of upper-outer quadrant of left female breast: Secondary | ICD-10-CM

## 2017-06-26 DIAGNOSIS — Z95828 Presence of other vascular implants and grafts: Secondary | ICD-10-CM

## 2017-06-26 DIAGNOSIS — Z170 Estrogen receptor positive status [ER+]: Secondary | ICD-10-CM | POA: Insufficient documentation

## 2017-06-26 DIAGNOSIS — G629 Polyneuropathy, unspecified: Secondary | ICD-10-CM | POA: Insufficient documentation

## 2017-06-26 DIAGNOSIS — Z5112 Encounter for antineoplastic immunotherapy: Secondary | ICD-10-CM | POA: Diagnosis not present

## 2017-06-26 DIAGNOSIS — Z923 Personal history of irradiation: Secondary | ICD-10-CM | POA: Diagnosis not present

## 2017-06-26 LAB — CBC WITH DIFFERENTIAL/PLATELET
BASOS ABS: 0.1 10*3/uL (ref 0.0–0.1)
BASOS PCT: 1 %
Eosinophils Absolute: 0.2 10*3/uL (ref 0.0–0.5)
Eosinophils Relative: 4 %
HEMATOCRIT: 35.1 % (ref 34.8–46.6)
HEMOGLOBIN: 12.1 g/dL (ref 11.6–15.9)
Lymphocytes Relative: 26 %
Lymphs Abs: 1.1 10*3/uL (ref 0.9–3.3)
MCH: 32 pg (ref 25.1–34.0)
MCHC: 34.5 g/dL (ref 31.5–36.0)
MCV: 92.9 fL (ref 79.5–101.0)
MONOS PCT: 9 %
Monocytes Absolute: 0.4 10*3/uL (ref 0.1–0.9)
NEUTROS ABS: 2.4 10*3/uL (ref 1.5–6.5)
NEUTROS PCT: 60 %
Platelets: 270 10*3/uL (ref 145–400)
RBC: 3.78 MIL/uL (ref 3.70–5.45)
RDW: 13.6 % (ref 11.2–14.5)
WBC: 4.2 10*3/uL (ref 3.9–10.3)

## 2017-06-26 LAB — COMPREHENSIVE METABOLIC PANEL
ALK PHOS: 59 U/L (ref 40–150)
ALT: 12 U/L (ref 0–55)
AST: 17 U/L (ref 5–34)
Albumin: 3.9 g/dL (ref 3.5–5.0)
Anion gap: 8 (ref 3–11)
BILIRUBIN TOTAL: 0.3 mg/dL (ref 0.2–1.2)
BUN: 12 mg/dL (ref 7–26)
CALCIUM: 9.3 mg/dL (ref 8.4–10.4)
CO2: 27 mmol/L (ref 22–29)
Chloride: 104 mmol/L (ref 98–109)
Creatinine, Ser: 0.82 mg/dL (ref 0.60–1.10)
GFR calc Af Amer: >60 mL/min (ref >60)
GFR calc non Af Amer: >60 mL/min (ref >60)
Glucose, Bld: 76 mg/dL (ref 70–140)
Potassium: 4.5 mmol/L (ref 3.5–5.1)
Sodium: 139 mmol/L (ref 136–145)
TOTAL PROTEIN: 6.7 g/dL (ref 6.4–8.3)

## 2017-06-26 MED ORDER — ACETAMINOPHEN 325 MG PO TABS
ORAL_TABLET | ORAL | Status: AC
  Filled 2017-06-26: qty 2

## 2017-06-26 MED ORDER — DIPHENHYDRAMINE HCL 25 MG PO CAPS
25.0000 mg | ORAL_CAPSULE | Freq: Once | ORAL | Status: AC
  Administered 2017-06-26: 25 mg via ORAL

## 2017-06-26 MED ORDER — HEPARIN SOD (PORK) LOCK FLUSH 100 UNIT/ML IV SOLN
500.0000 [IU] | Freq: Once | INTRAVENOUS | Status: AC | PRN
  Administered 2017-06-26: 500 [IU]
  Filled 2017-06-26: qty 5

## 2017-06-26 MED ORDER — TRASTUZUMAB CHEMO 150 MG IV SOLR
6.0000 mg/kg | Freq: Once | INTRAVENOUS | Status: AC
  Administered 2017-06-26: 399 mg via INTRAVENOUS
  Filled 2017-06-26: qty 19

## 2017-06-26 MED ORDER — SODIUM CHLORIDE 0.9% FLUSH
10.0000 mL | INTRAVENOUS | Status: DC | PRN
  Administered 2017-06-26: 10 mL
  Filled 2017-06-26: qty 10

## 2017-06-26 MED ORDER — SODIUM CHLORIDE 0.9 % IV SOLN
Freq: Once | INTRAVENOUS | Status: AC
  Administered 2017-06-26: 10:00:00 via INTRAVENOUS

## 2017-06-26 MED ORDER — DIPHENHYDRAMINE HCL 25 MG PO CAPS
ORAL_CAPSULE | ORAL | Status: AC
Start: 2017-06-26 — End: 2017-06-26
  Filled 2017-06-26: qty 1

## 2017-06-26 MED ORDER — SODIUM CHLORIDE 0.9% FLUSH
10.0000 mL | Freq: Once | INTRAVENOUS | Status: AC
  Administered 2017-06-26: 10 mL
  Filled 2017-06-26: qty 10

## 2017-06-26 MED ORDER — ACETAMINOPHEN 325 MG PO TABS
650.0000 mg | ORAL_TABLET | Freq: Once | ORAL | Status: AC
  Administered 2017-06-26: 650 mg via ORAL

## 2017-06-26 NOTE — Patient Instructions (Signed)
[facility] Discharge Instructions for Patients Receiving Chemotherapy  Today you received the following chemotherapy agents Herceptin  To help prevent nausea and vomiting after your treatment, we encourage you to take your nausea medication as directed   If you develop nausea and vomiting that is not controlled by your nausea medication, call the clinic.   BELOW ARE SYMPTOMS THAT SHOULD BE REPORTED IMMEDIATELY:  *FEVER GREATER THAN 100.5 F  *CHILLS WITH OR WITHOUT FEVER  NAUSEA AND VOMITING THAT IS NOT CONTROLLED WITH YOUR NAUSEA MEDICATION  *UNUSUAL SHORTNESS OF BREATH  *UNUSUAL BRUISING OR BLEEDING  TENDERNESS IN MOUTH AND THROAT WITH OR WITHOUT PRESENCE OF ULCERS  *URINARY PROBLEMS  *BOWEL PROBLEMS  UNUSUAL RASH Items with * indicate a potential emergency and should be followed up as soon as possible.  Feel free to call the clinic should you have any questions or concerns. The clinic phone number is [phone].  Please show the CHEMO ALERT CARD at check-in to the Emergency Department and triage nurse.   

## 2017-06-27 DIAGNOSIS — I890 Lymphedema, not elsewhere classified: Secondary | ICD-10-CM | POA: Diagnosis not present

## 2017-07-01 ENCOUNTER — Ambulatory Visit
Admission: RE | Admit: 2017-07-01 | Discharge: 2017-07-01 | Disposition: A | Discharge status: Home / Self Care | Payer: BLUE CROSS/BLUE SHIELD | Source: Ambulatory Visit | Attending: Oncology | Admitting: Oncology

## 2017-07-01 DIAGNOSIS — R922 Inconclusive mammogram: Secondary | ICD-10-CM | POA: Diagnosis not present

## 2017-07-01 DIAGNOSIS — C50412 Malignant neoplasm of upper-outer quadrant of left female breast: Secondary | ICD-10-CM

## 2017-07-01 DIAGNOSIS — Z170 Estrogen receptor positive status [ER+]: Principal | ICD-10-CM

## 2017-07-01 HISTORY — DX: Personal history of irradiation: Z92.3

## 2017-07-01 HISTORY — DX: Personal history of antineoplastic chemotherapy: Z92.21

## 2017-07-15 ENCOUNTER — Encounter (HOSPITAL_COMMUNITY)
Admission: RE | Admit: 2017-07-15 | Discharge: 2017-07-15 | Disposition: A | Discharge status: Home / Self Care | Payer: BLUE CROSS/BLUE SHIELD | Source: Ambulatory Visit | Attending: General Surgery | Admitting: General Surgery

## 2017-07-15 ENCOUNTER — Other Ambulatory Visit: Payer: Self-pay

## 2017-07-15 ENCOUNTER — Encounter (HOSPITAL_COMMUNITY): Payer: Self-pay

## 2017-07-15 DIAGNOSIS — Z01812 Encounter for preprocedural laboratory examination: Secondary | ICD-10-CM | POA: Insufficient documentation

## 2017-07-15 HISTORY — DX: Anemia, unspecified: D64.9

## 2017-07-15 LAB — CBC
HEMATOCRIT: 39.1 % (ref 36.0–46.0)
HEMOGLOBIN: 13.1 g/dL (ref 12.0–15.0)
MCH: 31.2 pg (ref 26.0–34.0)
MCHC: 33.5 g/dL (ref 30.0–36.0)
MCV: 93.1 fL (ref 78.0–100.0)
Platelets: 279 10*3/uL (ref 150–400)
RBC: 4.2 MIL/uL (ref 3.87–5.11)
RDW: 12.7 % (ref 11.5–15.5)
WBC: 9.3 10*3/uL (ref 4.0–10.5)

## 2017-07-15 NOTE — Progress Notes (Signed)
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 This showed a possible mass in the left breast. On 07/02/2016 she underwent left diagnostic mammography with tomography and left breast ultrasonography. The breast density was category C. In the left breast upper outer quadrant there was a microlobulated mass which was not directly palpable, although there was slight thickening in the left breast 1:00 position. Targeted ultrasonography confirmed a solid hypoechoic microlobulated mass in the left breast 1:00 radiant 4 cm from the nipple, measuring 0.8 cm.  On 07/03/2016 Jamie [name] underwent biopsy of the left breast mass in question, and this showed ([id number]) invasive ductal carcinoma, grade 2 or 3, with extracellular mucin, estrogen receptor 5% positive with weak staining intensity, progesterone receptor negative, with an MIB-1 of 50%, and HER-2 amplified, the signals ratio being 5.71 and the number per cell 14.55.  Her subsequent history is as detailed below  INTERVAL HISTORY: Jamie [name] returns today for follow-up of her estrogen receptor positive and HER-2 amplified breast cancer accompanied by her husband. She continues on  anastrozole, with good tolerance. She denise issues with hot flashes or vaginal dryness.   She also receives trastuzumab every 21 days, with a dose due today, which will be her last. She also tolerates this well.   Since her last visit, she underwent diagnostic bilateral mammography with CAD and tomography on 07/01/2017 at [facility] showing: breast density category C. There was no evidence of malignancy.   REVIEW OF SYSTEMS: Jamie [name] reports that she will have her port removed 07/17/2017. Today, she is excited to ring the bell and complete her treatments. She wears a compression sleeve when she exercises and lifts weights. She goes to [employer] 2 times per week and lifts weights 3-4 times per week. She also takes walks in between. She reports that her nails are brittle. She takes biotin daily, but her nails are getting worse. Her PCP checks her thyroid levels. She denies unusual headaches, visual changes, nausea, vomiting, or dizziness. There has been no unusual cough, phlegm production, or pleurisy. This been no change in bowel or bladder habits. She denies unexplained fatigue or unexplained weight loss, bleeding, rash, or fever. A detailed review of systems was otherwise stable.    PAST MEDICAL HISTORY: Past Medical History:  Diagnosis Date  . Allergy   . Anemia   . Arthritis    feet  . Basal cell carcinoma   . Breast cancer ([facility]) 06/2016   left breast  . Colon polyps   . Eczema   . Genetic testing 08/15/2016   Jamie [name] underwent genetic testing for hereditary cancer syndrome through [facility]'s 43-gene Common Hereditary Cancers Panel. 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 Result report is dated 08/15/2016. Please see genetic counseling documentation from 08/17/2016 for further discussion.  [name]  History of radiation therapy 01/02/17-01/31/17   left breast was treated to 42.72 Gy in 16 fractions, left breast  boost 10 Gy in 5 fractions  . Personal history of chemotherapy   . Personal history of radiation therapy   . PONV (postoperative nausea and vomiting)     PAST SURGICAL HISTORY: Past Surgical History:  Procedure Laterality Date  . BONE SPUR Bilateral 2001 AND 1988  . BREAST BIOPSY    . BREAST LUMPECTOMY Left    07/2016  . BREAST LUMPECTOMY WITH RADIOACTIVE SEED AND SENTINEL LYMPH NODE BIOPSY Left 07/26/2016   Procedure: BREAST LUMPECTOMY WITH RADIOACTIVE SEED AND SENTINEL LYMPH NODE BIOPSY;  Surgeon: Jamie Klein, MD;  Location: [facility];  Service: General;  Laterality: Left;  . BUNIONECTOMY Bilateral 02/2012  . COLONOSCOPY W/ POLYPECTOMY  08/2007  . DILATION AND CURETTAGE OF UTERUS  2002  . MOHS SURGERY  2010  . PORTACATH PLACEMENT Right 07/26/2016   Procedure: INSERTION PORT-A-CATH;  Surgeon: Jamie Klein, MD;  Location: [facility];  Service: General;  Laterality: Right;    FAMILY HISTORY Family History  Problem Relation Age of Onset  . Ovarian cancer Mother 28  . Hypertension Father   . Stroke Father   . Heart disease Father   . Breast cancer Maternal Aunt 77       recurred at 73  . Heart disease Paternal Grandmother   . Osteoporosis Sister   . Liver cancer Maternal Uncle 72  The patient's father died at age 20, with some form of skin cancer, most likely melanoma. The patient's mother died at the age of 31 with ovarian cancer, which had been diagnosed a few months prior. The patient had no brothers, 1 sister. A maternal aunt was diagnosed with breast cancer at age 46, recurrent age 49.  GYNECOLOGIC HISTORY:  No LMP recorded. Patient is postmenopausal. Menarche age 37, the patient is GX P0. She stopped having periods approximately age 59. 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 Her husband Jamie [name] worked as a Dance movement psychotherapist for a [employer]. Their last name is  pronounced foh-TEE-ah and they tell me it means "burning" in [place]. Their children are adopted. Jamie [name] lives in [place] and works in Press photographer, and [place] lives in [place] and is an Scientist, water quality. The patient has 3 grandchildren. She is not a Ambulance person.    ADVANCED DIRECTIVES: In place   HEALTH MAINTENANCE: Social History   Tobacco Use  . Smoking status: Never Smoker  . Smokeless tobacco: Never Used  Substance Use Topics  . Alcohol use: Yes    Alcohol/week: 0.0 oz    Comment: 1-2  . Drug use: No     Colonoscopy:2009  JIR:CVELFY 2018  Bone density: 02/26/2017 T-score of -2.0 at the left femur neck    Allergies  Allergen Reactions  . Adhesive [Tape] Rash  . Codeine Nausea Only and Other (See Comments)    Dizzy  . Sulfamethoxazole Nausea And Vomiting    Current Outpatient Medications  Medication Sig Dispense Refill  . AMBULATORY NON FORMULARY MEDICATION 1. Upper extremity compression sleeve. 2. Compression glove. Dx: Lymphedema. Size and fit as needed. (Patient not taking: Reported on 07/16/2017) 4 Units prn  . anastrozole (ARIMIDEX) 1 MG tablet Take 1 tablet (1 mg total) by mouth daily. (Patient taking differently: Take 1 mg by mouth at bedtime. ) 30 tablet 12  . BIOTIN  PO Take 1 tablet by mouth daily.    . calcium-vitamin D (OSCAL WITH D) 500-200 MG-UNIT tablet Take 1 tablet by mouth 2 (two) times daily.    . cetirizine (ZYRTEC) 10 MG tablet Take 10 mg by mouth daily as needed (for allergies--Spring/Summer).     . Cholecalciferol (VITAMIN D3) 1000 units CAPS Take 1,000 Units by mouth daily.     . Cyanocobalamin (B-12) 2500 MCG TABS Place 2,500 mcg under the tongue daily.     . diphenhydrAMINE (BENADRYL) 25 MG tablet Take 12.5 mg by mouth at bedtime as needed for sleep.    . Lactobacillus (ACIDOPHILUS PROBIOTIC PO) Take 1 capsule by mouth daily.     [name] lidocaine-prilocaine (EMLA) cream Apply one application to port 1-2 hours prior to access. Cover with saran wrap. 30 g 3  .  Multiple Vitamin (MULTIVITAMIN) tablet Take 1 tablet by mouth daily.     No current facility-administered medications for this visit.     OBJECTIVE: Middle-aged white woman who appears well  Vitals:   07/17/17 1107  BP: 106/65  Pulse: 66  Resp: 18  Temp: 98.3 F (36.8 C)  SpO2: 100%     Body mass index is 24.65 kg/m.   Filed Weights   07/17/17 1107  Weight: 148 lb 1.6 oz (67.2 kg)     ECOG FS:0 - Asymptomatic  Sclerae unicteric, pupils round and equal Oropharynx clear and moist No cervical or supraclavicular adenopathy Lungs no rales or rhonchi Heart regular rate and rhythm Abd soft, nontender, positive bowel sounds MSK no focal spinal tenderness, no upper extremity lymphedema Neuro: nonfocal, well oriented, appropriate affect Breasts: The right breast is benign.  The left breast is undergone lumpectomy followed by radiation with no evidence of local recurrent bili are benign.  LAB RESULTS:  CMP     Component Value Date/Time   NA 138 07/17/2017 0951   NA 140 05/15/2017 0920   K 4.7 07/17/2017 0951   K 4.3 05/15/2017 0920   CL 103 07/17/2017 0951   CO2 26 07/17/2017 0951   CO2 25 05/15/2017 0920   GLUCOSE 75 07/17/2017 0951   GLUCOSE 94 05/15/2017 0920   BUN 11 07/17/2017 0951   BUN 11.5 05/15/2017 0920   CREATININE 0.86 07/17/2017 0951   CREATININE 0.8 05/15/2017 0920   CALCIUM 9.8 07/17/2017 0951   CALCIUM 9.3 05/15/2017 0920   PROT 6.9 07/17/2017 0951   PROT 6.5 05/15/2017 0920   ALBUMIN 3.9 07/17/2017 0951   ALBUMIN 3.8 05/15/2017 0920   AST 15 07/17/2017 0951   AST 17 05/15/2017 0920   ALT 13 07/17/2017 0951   ALT 10 05/15/2017 0920   ALKPHOS 56 07/17/2017 0951   ALKPHOS 53 05/15/2017 0920   BILITOT 0.3 07/17/2017 0951   BILITOT 0.36 05/15/2017 0920   GFRNONAA >60 07/17/2017 0951   GFRNONAA 72 03/25/2017 0816   GFRAA >60 07/17/2017 0951   GFRAA 84 03/25/2017 0816    INo results found for: SPEP, UPEP  Lab Results  Component Value Date    WBC 4.4 07/17/2017   NEUTROABS 2.5 07/17/2017   HGB 12.6 07/17/2017   HCT 38.1 07/17/2017   MCV 94.5 07/17/2017   PLT 248 07/17/2017      Chemistry      Component Value Date/Time   NA 138 07/17/2017 0951   NA 140 05/15/2017 0920   K 4.7 07/17/2017 0951   K 4.3 05/15/2017 0920   CL 103 07/17/2017 0951   CO2 26 07/17/2017  0951   CO2 25 05/15/2017 0920   BUN 11 07/17/2017 0951   BUN 11.5 05/15/2017 0920   CREATININE 0.86 07/17/2017 0951   CREATININE 0.8 05/15/2017 0920   GLU 73 08/02/2014      Component Value Date/Time   CALCIUM 9.8 07/17/2017 0951   CALCIUM 9.3 05/15/2017 0920   ALKPHOS 56 07/17/2017 0951   ALKPHOS 53 05/15/2017 0920   AST 15 07/17/2017 0951   AST 17 05/15/2017 0920   ALT 13 07/17/2017 0951   ALT 10 05/15/2017 0920   BILITOT 0.3 07/17/2017 0951   BILITOT 0.36 05/15/2017 0920       No results found for: LABCA2  No components found for: LABCA125  No results for input(s): INR in the last 168 hours.  Urinalysis    Component Value Date/Time   COLORURINE COLORLESS (A) 12/22/2016 2105   APPEARANCEUR CLEAR 12/22/2016 2105   LABSPEC 1.005 12/22/2016 2105   PHURINE 7.0 12/22/2016 2105   GLUCOSEU NEGATIVE 12/22/2016 2105   HGBUR NEGATIVE 12/22/2016 2105   [place] NEGATIVE 12/22/2016 2105   [place] NEGATIVE 12/22/2016 2105   PROTEINUR NEGATIVE 12/22/2016 2105   NITRITE NEGATIVE 12/22/2016 2105   LEUKOCYTESUR NEGATIVE 12/22/2016 2105     STUDIES: Since her last visit, she underwent diagnostic bilateral mammography with CAD and tomography on 07/01/2017 at [facility] showing: breast density category C. There was no evidence of malignancy.   Final echocardiogram is already scheduled for mid March  ELIGIBLE FOR AVAILABLE RESEARCH PROTOCOL: no  ASSESSMENT: 62 y.o. Jamie [name], [name] woman status post biopsy of the left breast upper outer quadrant lesion 07/03/2016 showing a clinical T1b N0, stage 1B invasive ductal carcinoma, grade 2 or 3,  estrogen receptor weakly positive at 5%, progesterone receptor negative, but HER-2 strongly amplified, with an MIB-1 of 50%.  (1) status post left lumpectomy with sentinel lymph node sampling 07/26/2016 for a pT1c pN0, stage IA invasive ductal carcinoma, grade 3, with extracellular mucin, and negative margins  (2) chemotherapy consisting of Paclitaxel weekly starting 09/18/2016, discontinued after one cycle due to peripheral neuropathy.   (a) Gemcitabine and Carboplatin started 10/02/2016, repeated days 1 and 8 of each 21 day cycle to a total of 8 doses (4 cycles)-- final dose 12/18/2016  (3) trastuzumab started 08/07/2016, completing 12 months on 07/17/2017  (a) baseline echocardiogram 07/25/2016 shows an ejection fraction of 60-65%  (b) repeat echocardiogram 10/22/2016 shows stable ejection fraction  (c) echocardiogram January 24, 2017 found an ejection fraction in the 55-60% range.  (d) echocardiogram 05/02/2017 showed an ejection fraction of 55-60%  (4) adjuvant radiation 01/02/2017-01/31/2017:  Left breast was treated to 42.72 Gy in 16 fractions and boosted to 10 Gy in 5 fractions.  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   (a) associated with increased colorectal cancer risk, possibly breast cancer (at least in [place])  (b) colonoscopy 09/11/2007/ Ezzie Dural  (c) colonoscopy 04/17/2017/ Jamie [name]     PLAN: Jamie [name] is now just about a year out from definitive surgery for her breast cancer with no evidence of disease recurrence.  This is very favorable.  She completes her year of trastuzumab today.  She has tolerated that well.  There  has been no  evidence of cardiomyopathy.  She will have one final echo mid March  She already is scheduled to have her port removed tomorrow.  She had many questions regarding the compression sleeve.  I think it is a good idea for her to use it when she is exercising or when she goes on long flights, more than 3 hours.  She understands she does not really have to do it and that the data for this is week but I certainly would do it as a preventive  She sees her primary care physician in October and her gynecologist in February so I think if I see her in June that would work the best.  We will check labs at the next visit.  She knows to call for any other issues that may develop before then.  Antino Mayabb, Jamie Dad, MD  07/17/17 11:24 AM Medical Oncology and Hematology [facility] [address] [place], [place] [address] Tel. [phone]    Fax. [phone]  This document serves as a record of services personally performed by Lurline Del, MD. It was created on his behalf by Sheron Nightingale, a trained medical scribe. The creation of this record is based on the scribe's personal observations and the provider's statements to them.   I have reviewed the above documentation for accuracy and completeness, and I agree with the above.

## 2017-07-15 NOTE — Pre-Procedure Instructions (Signed)
Jamie [name]  07/15/2017      [facility], [address] [name] [address] Phone: [phone] Fax: [phone]    Your procedure is scheduled on 07-18-2017  Thursday   Report to [facility] Admitting at 5:30 A.M.   Call this number if you have problems the morning of surgery:  [phone]   Remember:  Do not eat food or drink liquids after midnight.   Take these medicines the morning of surgery with A SIP OF WATER  Cetirizine(Zyrtec)   STOP TAKING ANY ASPIRIN(UNLESS OTHERWISE INSTRUCTED BY YOUR SURGEON),ANTIINFLAMATORIES (IBUPROFEN,ALEVE,MOTRIN,ADVIL,GOODY'S POWDERS),HERBAL SUPPLEMENTS,FISH OIL,AND VITAMINS 5-7 DAYS PRIOR TO SURGERY    Do not wear jewelry, make-up or nail polish.  Do not wear lotions, powders, or perfumes, or deodorant.  Do not shave 48 hours prior to surgery.  Men may shave face and neck.   Do not bring valuables to the hospital.  [facility] is not responsible for any belongings or valuables.  Contacts, dentures or bridgework may not be worn into surgery.  Leave your suitcase in the car.  After surgery it may be brought to your room.  For patients admitted to the hospital, discharge time will be determined by your treatment team.  Patients discharged the day of surgery will not be allowed to drive home.   Special Instructions: [facility] - Preparing for Surgery  Before surgery, you can play an important role.  Because skin is not sterile, your skin needs to be as free of germs as possible.  You can reduce the number of germs on you skin by washing with CHG (chlorahexidine gluconate) soap before surgery.  CHG is an antiseptic cleaner which kills germs and bonds with the skin to continue killing germs even after washing.  Please DO NOT use if you have an allergy to CHG or antibacterial soaps.  If your skin becomes reddened/irritated stop using the  CHG and inform your nurse when you arrive at Short Stay.  Do not shave (including legs and underarms) for at least 48 hours prior to the first CHG shower.  You may shave your face.  Please follow these instructions carefully:   1.  Shower with CHG Soap the night before surgery and the   morning of Surgery.  2.  If you choose to wash your hair, wash your hair first as usual with your normal shampoo.  3.  After you shampoo, rinse your hair and body thoroughly to remove the  Shampoo.  4.  Use CHG as you would any other liquid soap.  You can apply chg directly  to the skin and wash gently with scrungie or a clean washcloth.  5.  Apply the CHG Soap to your body ONLY FROM THE NECK DOWN.   Do not use on open wounds or open sores.  Avoid contact with your eyes,  ears, mouth and genitals (private parts).  Wash genitals (private parts) with your normal soap.  6.  Wash thoroughly, paying special attention to the area where your surgery will be performed.  7.  Thoroughly rinse your body with warm water from the neck down.  8.  DO NOT shower/wash with your normal soap after using and rinsing o  the CHG Soap.  9.  Pat yourself dry with a clean towel.            10.  Wear clean  pajamas.            11.  Place clean sheets on your bed the night of your first shower and do not sleep with pets.  Day of Surgery  Do not apply any lotions/deodorants the morning of surgery.  Please wear clean clothes to the hospital/surgery center.     Please read over the following fact sheets that you were given. Surgical Site Infection Prevention

## 2017-07-16 ENCOUNTER — Encounter: Payer: Self-pay | Admitting: Obstetrics & Gynecology

## 2017-07-16 ENCOUNTER — Ambulatory Visit (INDEPENDENT_AMBULATORY_CARE_PROVIDER_SITE_OTHER): Payer: BLUE CROSS/BLUE SHIELD | Admitting: Obstetrics & Gynecology

## 2017-07-16 VITALS — BP 107/69 | HR 70 | Ht 65.0 in | Wt 150.0 lb

## 2017-07-16 DIAGNOSIS — R102 Pelvic and perineal pain unspecified side: Secondary | ICD-10-CM

## 2017-07-16 DIAGNOSIS — Z01419 Encounter for gynecological examination (general) (routine) without abnormal findings: Secondary | ICD-10-CM | POA: Diagnosis not present

## 2017-07-16 DIAGNOSIS — N942 Vaginismus: Secondary | ICD-10-CM

## 2017-07-16 NOTE — H&P (Signed)
Jamie [name] Location: [facility] Patient #: [id number] DOB: May 21, 1956 Married / Language: English / Race: White Female   History of Present Illness The patient is a 62 year old female who presents for a follow-up for Breast cancer. Patient is a 62 year old female who presented with new left breast cancer in Feb 2018. She was worked up for screening detected asymmetry. Her diagnostic imaging following the abnormal mammogram showed a 7 mm macrolobulated mass at 1:00. 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 This was ER positive, PR negative, and HER-2 overexpressed. The ER staining was week. Ki-67 was 50%. Of note, she has a mother that had ovarian cancer and a maternal aunt who had breast cancer twice. Father had non melanoma skin cancer. She had menarche at age 23. She is nulliparous as her children are adopted. She did not use hormone replacement therapy. She is no longer having periods. She did use birth control pills for a total of 2-4 years.  She is s/p left lumpectomy and SLN bx 3/8. Margins and LN were negative. Tumor was only 1.5 cm. This was larger that mammo/ultrasound, but was smaller than MRI.   She continues to improve. She is almost done with her herceptin. She is getting around well. She is eager to get the port out. She has not new breast findings. She also has not had new health findings.  Pathology 07/26/16 Diagnosis 1. 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 Lymph node, sentinel, biopsy, Left Axilla #1 TWO BENIGN LYMPH NODES (0/2) 3. Lymph node, sentinel, biopsy, Left Axilla #2 BENIGN FIBROADIPOSE TISSUE   Allergies  Adhesive Tape *MEDICAL DEVICES AND SUPPLIES*  CODEINE  Sulfa Antibiotics   Medication  History Anastrozole (1MG Tablet, Oral) Active. Tylenol (325MG Tablet, Oral) Active. Advil (200MG Tablet, Oral) Active. Budesonide-Formoterol Fumarate (160-4.5MCG/ACT Aerosol, Inhalation) Active. Cholecalciferol (1000UNIT Capsule, Oral) Active. Cyanocobalamin (1000MCG Tablet, Oral) Active. Ginkgo Biloba (40MG Capsule, Oral) Active. Red Yeast Rice (600MG Capsule, Oral) Active. Medications Reconciled    Review of Systems All other systems negative  Vitals  Weight: 150.38 lb Height: 66in Body Surface Area: 1.77 m Body Mass Index: 24.27 kg/m  Temp.: 98.38F(Oral)  Pulse: 70 (Regular)  BP: 112/70 (Sitting, Left Arm, Standard)       Physical Exam  General Mental Status-Alert. General Appearance-Consistent with stated age. Hydration-Well hydrated. Voice-Normal.  Head and Neck Head-normocephalic, atraumatic with no lesions or palpable masses.  Eye Sclera/Conjunctiva - Bilateral-No scleral icterus.  Chest and Lung Exam Chest and lung exam reveals -quiet, even and easy respiratory effort with no use of accessory muscles. Inspection Chest Wall - Normal. Back - normal.  Breast Note: breasts reasonably symmetric bilaterally. non significant breast lymphedema or size discrepency. No palpable masses or skin dimpling. Anticipated mild thickening at lumpectomy site on left. No nipple retraction or nipple discharge. No LAD.   Cardiovascular Cardiovascular examination reveals -normal pedal pulses bilaterally. Note: regular rate and rhythm  Abdomen Inspection-Inspection Normal. Palpation/Percussion Palpation and Percussion of the abdomen reveal - Soft, Non Tender, No Rebound tenderness, No Rigidity (guarding) and No hepatosplenomegaly.  Peripheral Vascular Upper Extremity Inspection - Bilateral - Normal - No Clubbing, No Cyanosis, No Edema, Pulses Intact. Lower Extremity Palpation - Edema - Bilateral - No  edema.  Neurologic Neurologic evaluation reveals -alert and oriented x 3 with no  impairment of recent or remote memory. Mental Status-Normal.  Musculoskeletal Global Assessment -Note: no gross deformities.  Normal Exam - Left-Upper Extremity Strength Normal and Lower Extremity Strength Normal. Normal Exam - Right-Upper Extremity Strength Normal and Lower Extremity Strength Normal.  Lymphatic Head & Neck  General Head & Neck Lymphatics: Bilateral - Description - Normal. Axillary  General Axillary Region: Bilateral - Description - Normal. Tenderness - Non Tender.    Assessment & Plan  MALIGNANT NEOPLASM OF UPPER-OUTER QUADRANT OF LEFT BREAST IN FEMALE, ESTROGEN RECEPTOR POSITIVE (C50.412) Impression: No clinical evidence of disease.  Plan d/c port.  Reviewed risks/benefits of port removal. Current Plans Follow up with [place] in the office in 6 MONTHS.   Call us sooner as needed.    Signed by [name], MD

## 2017-07-17 ENCOUNTER — Inpatient Hospital Stay: Payer: BLUE CROSS/BLUE SHIELD

## 2017-07-17 ENCOUNTER — Inpatient Hospital Stay (HOSPITAL_BASED_OUTPATIENT_CLINIC_OR_DEPARTMENT_OTHER): Payer: BLUE CROSS/BLUE SHIELD | Admitting: Oncology

## 2017-07-17 ENCOUNTER — Telehealth: Payer: Self-pay | Admitting: Oncology

## 2017-07-17 VITALS — BP 106/65 | HR 66 | Temp 98.3°F | Resp 18 | Ht 65.0 in | Wt 148.1 lb

## 2017-07-17 DIAGNOSIS — C50412 Malignant neoplasm of upper-outer quadrant of left female breast: Secondary | ICD-10-CM

## 2017-07-17 DIAGNOSIS — Z923 Personal history of irradiation: Secondary | ICD-10-CM | POA: Diagnosis not present

## 2017-07-17 DIAGNOSIS — Z85828 Personal history of other malignant neoplasm of skin: Secondary | ICD-10-CM | POA: Diagnosis not present

## 2017-07-17 DIAGNOSIS — G629 Polyneuropathy, unspecified: Secondary | ICD-10-CM | POA: Diagnosis not present

## 2017-07-17 DIAGNOSIS — Z170 Estrogen receptor positive status [ER+]: Secondary | ICD-10-CM

## 2017-07-17 DIAGNOSIS — Z95828 Presence of other vascular implants and grafts: Secondary | ICD-10-CM

## 2017-07-17 DIAGNOSIS — Z5112 Encounter for antineoplastic immunotherapy: Secondary | ICD-10-CM | POA: Diagnosis not present

## 2017-07-17 DIAGNOSIS — Z79899 Other long term (current) drug therapy: Secondary | ICD-10-CM | POA: Diagnosis not present

## 2017-07-17 DIAGNOSIS — C4491 Basal cell carcinoma of skin, unspecified: Secondary | ICD-10-CM | POA: Insufficient documentation

## 2017-07-17 DIAGNOSIS — N942 Vaginismus: Secondary | ICD-10-CM | POA: Insufficient documentation

## 2017-07-17 LAB — COMPREHENSIVE METABOLIC PANEL
ALK PHOS: 56 U/L (ref 40–150)
ALT: 13 U/L (ref 0–55)
AST: 15 U/L (ref 5–34)
Albumin: 3.9 g/dL (ref 3.5–5.0)
Anion gap: 9 (ref 3–11)
BILIRUBIN TOTAL: 0.3 mg/dL (ref 0.2–1.2)
BUN: 11 mg/dL (ref 7–26)
CALCIUM: 9.8 mg/dL (ref 8.4–10.4)
CHLORIDE: 103 mmol/L (ref 98–109)
CO2: 26 mmol/L (ref 22–29)
CREATININE: 0.86 mg/dL (ref 0.60–1.10)
GFR calc Af Amer: >60 mL/min (ref >60)
Glucose, Bld: 75 mg/dL (ref 70–140)
Potassium: 4.7 mmol/L (ref 3.5–5.1)
Sodium: 138 mmol/L (ref 136–145)
Total Protein: 6.9 g/dL (ref 6.4–8.3)

## 2017-07-17 LAB — CBC WITH DIFFERENTIAL/PLATELET
BASOS PCT: 1 %
Basophils Absolute: 0 10*3/uL (ref 0.0–0.1)
EOS ABS: 0.2 10*3/uL (ref 0.0–0.5)
EOS PCT: 4 %
HCT: 38.1 % (ref 34.8–46.6)
Hemoglobin: 12.6 g/dL (ref 11.6–15.9)
LYMPHS ABS: 1.3 10*3/uL (ref 0.9–3.3)
Lymphocytes Relative: 30 %
MCH: 31.3 pg (ref 25.1–34.0)
MCHC: 33.1 g/dL (ref 31.5–36.0)
MCV: 94.5 fL (ref 79.5–101.0)
Monocytes Absolute: 0.4 10*3/uL (ref 0.1–0.9)
Monocytes Relative: 8 %
Neutro Abs: 2.5 10*3/uL (ref 1.5–6.5)
Neutrophils Relative %: 57 %
PLATELETS: 248 10*3/uL (ref 145–400)
RBC: 4.03 MIL/uL (ref 3.70–5.45)
RDW: 12.6 % (ref 11.2–14.5)
WBC: 4.4 10*3/uL (ref 3.9–10.3)

## 2017-07-17 MED ORDER — DIPHENHYDRAMINE HCL 25 MG PO CAPS
ORAL_CAPSULE | ORAL | Status: AC
  Filled 2017-07-17: qty 1

## 2017-07-17 MED ORDER — ACETAMINOPHEN 325 MG PO TABS
ORAL_TABLET | ORAL | Status: AC
  Filled 2017-07-17: qty 2

## 2017-07-17 MED ORDER — HEPARIN SOD (PORK) LOCK FLUSH 100 UNIT/ML IV SOLN
500.0000 [IU] | Freq: Once | INTRAVENOUS | Status: AC | PRN
  Administered 2017-07-17: 500 [IU]
  Filled 2017-07-17: qty 5

## 2017-07-17 MED ORDER — DIPHENHYDRAMINE HCL 25 MG PO CAPS
25.0000 mg | ORAL_CAPSULE | Freq: Once | ORAL | Status: AC
  Administered 2017-07-17: 25 mg via ORAL

## 2017-07-17 MED ORDER — SODIUM CHLORIDE 0.9% FLUSH
10.0000 mL | Freq: Once | INTRAVENOUS | Status: AC
  Administered 2017-07-17: 10 mL
  Filled 2017-07-17: qty 10

## 2017-07-17 MED ORDER — ACETAMINOPHEN 325 MG PO TABS
650.0000 mg | ORAL_TABLET | Freq: Once | ORAL | Status: AC
  Administered 2017-07-17: 650 mg via ORAL

## 2017-07-17 MED ORDER — SODIUM CHLORIDE 0.9% FLUSH
10.0000 mL | INTRAVENOUS | Status: DC | PRN
  Administered 2017-07-17: 10 mL
  Filled 2017-07-17: qty 10

## 2017-07-17 MED ORDER — TRASTUZUMAB CHEMO 150 MG IV SOLR
6.0000 mg/kg | Freq: Once | INTRAVENOUS | Status: AC
  Administered 2017-07-17: 399 mg via INTRAVENOUS
  Filled 2017-07-17: qty 19

## 2017-07-17 MED ORDER — SODIUM CHLORIDE 0.9 % IV SOLN
Freq: Once | INTRAVENOUS | Status: AC
  Administered 2017-07-17: 13:00:00 via INTRAVENOUS

## 2017-07-17 NOTE — Patient Instructions (Signed)
[facility] Discharge Instructions for Patients Receiving Chemotherapy  Today you received the following chemotherapy agents Herceptin  To help prevent nausea and vomiting after your treatment, we encourage you to take your nausea medication as directed   If you develop nausea and vomiting that is not controlled by your nausea medication, call the clinic.   BELOW ARE SYMPTOMS THAT SHOULD BE REPORTED IMMEDIATELY:  *FEVER GREATER THAN 100.5 F  *CHILLS WITH OR WITHOUT FEVER  NAUSEA AND VOMITING THAT IS NOT CONTROLLED WITH YOUR NAUSEA MEDICATION  *UNUSUAL SHORTNESS OF BREATH  *UNUSUAL BRUISING OR BLEEDING  TENDERNESS IN MOUTH AND THROAT WITH OR WITHOUT PRESENCE OF ULCERS  *URINARY PROBLEMS  *BOWEL PROBLEMS  UNUSUAL RASH Items with * indicate a potential emergency and should be followed up as soon as possible.  Feel free to call the clinic should you have any questions or concerns. The clinic phone number is [phone].  Please show the CHEMO ALERT CARD at check-in to the Emergency Department and triage nurse.   

## 2017-07-17 NOTE — Anesthesia Preprocedure Evaluation (Addendum)
Anesthesia Evaluation  Patient identified by MRN, date of birth, ID band Patient awake    Reviewed: Allergy & Precautions, NPO status , Patient's Chart, lab work & pertinent test results  History of Anesthesia Complications (+) PONV and history of anesthetic complications  Airway Mallampati: II  TM Distance: >3 FB Neck ROM: Full    Dental no notable dental hx. (+) Teeth Intact, Dental Advisory Given   Pulmonary neg pulmonary ROS,    Pulmonary exam normal breath sounds clear to auscultation       Cardiovascular negative cardio ROS Normal cardiovascular exam Rhythm:Regular Rate:Normal  ECG: SR, rate 70  ECHO: LV EF: 55% - 60%   Neuro/Psych negative neurological ROS  negative psych ROS   GI/Hepatic negative GI ROS, Neg liver ROS,   Endo/Other  negative endocrine ROS  Renal/GU negative Renal ROS     Musculoskeletal negative musculoskeletal ROS (+)   Abdominal   Peds  Hematology S/p chemo and radiation   Anesthesia Other Findings BREAST CANCER  Reproductive/Obstetrics                           Anesthesia Physical Anesthesia Plan  ASA: II  Anesthesia Plan: MAC   Post-op Pain Management:    Induction: Intravenous  PONV Risk Score and Plan: 3 and Ondansetron, Treatment may vary due to age or medical condition and Propofol infusion  Airway Management Planned: Natural Airway  Additional Equipment:   Intra-op Plan:   Post-operative Plan:   Informed Consent: I have reviewed the patients History and Physical, chart, labs and discussed the procedure including the risks, benefits and alternatives for the proposed anesthesia with the patient or authorized representative who has indicated his/her understanding and acceptance.   Dental advisory given  Plan Discussed with: CRNA  Anesthesia Plan Comments:        Anesthesia Quick Evaluation

## 2017-07-17 NOTE — Telephone Encounter (Signed)
Gave patient AVS and calendar of upcoming June appointments.  °

## 2017-07-17 NOTE — Progress Notes (Signed)
Subjective:     Jamie [name] is a 62 y.o. female here for a routine exam.  Current complaints: pain with intercourse; is has intimate contact with husband but no penetration for several years.  Pt thinks it is due to dryness.  Pt is finishing treatment for breast cancer tomorrow!   Gynecologic History No LMP recorded. Patient is postmenopausal. Contraception: none Last Pap: 2018. Results were: negative co testing Last mammogram: 06/2017. Results were: Birads 2--followed by Oncology  Obstetric History OB History  Gravida Para Term Preterm AB Living  0 0 0 0 0 0  SAB TAB Ectopic Multiple Live Births  0 0 0 0 0         The following portions of the patient's history were reviewed and updated as appropriate: allergies, current medications, past family history, past medical history, past social history, past surgical history and problem list.  Review of Systems Pertinent items noted in HPI and remainder of comprehensive ROS otherwise negative.    Objective:      Vitals:   07/16/17 1355  BP: 107/69  Pulse: 70  Weight: 150 lb (68 kg)  Height: 5\' 5"  (1.651 m)   Vitals:  WNL General appearance: alert, cooperative and no distress  HEENT: Normocephalic, without obvious abnormality, atraumatic Eyes: negative Throat: lips, mucosa, and tongue normal; teeth and gums normal  Respiratory: Clear to auscultation bilaterally  CV: Regular rate and rhythm  Breasts:  Normal appearance, no masses or tenderness, no nipple retraction or dimpling  GI: Soft, non-tender; bowel sounds normal; no masses,  no organomegaly  GU: External Genitalia:  Tanner V, no lesion Urethra:  No prolapse   Vagina: Unable to emit speculum due to pain.  Pt has vaginismus.  Cervix: No CMT  Uterus:  Normal size and contour, non tender; limited by pain with exam (vaginal pain)  Adnexa: Normal, no masses  Musculoskeletal: No edema, redness or tenderness in the calves or thighs  Skin: No lesions or rash  Lymphatic:  Axillary adenopathy: none     Psychiatric: Normal mood and behavior    Assessment:    Healthy female exam.   Vaginismus  Plan:   No history of LEEP or cryo; Yearly paps done and all normal except for an AGUS 4 years ago.  Pt has had no bleeding.  Pt due for PAP in 2-4 years.    Pelvic PT for vaginismus.   Once patient is in less pain, can do speculum exam and assess for atrophic vaginitis and look at cervix.

## 2017-07-18 ENCOUNTER — Ambulatory Visit (HOSPITAL_COMMUNITY): Payer: BLUE CROSS/BLUE SHIELD | Admitting: Anesthesiology

## 2017-07-18 ENCOUNTER — Encounter (HOSPITAL_COMMUNITY): Payer: Self-pay

## 2017-07-18 ENCOUNTER — Encounter (HOSPITAL_COMMUNITY)
Admission: RE | Disposition: A | Discharge status: Home / Self Care | Payer: Self-pay | Source: Ambulatory Visit | Attending: General Surgery

## 2017-07-18 ENCOUNTER — Other Ambulatory Visit: Payer: Self-pay

## 2017-07-18 ENCOUNTER — Ambulatory Visit (HOSPITAL_COMMUNITY)
Admission: RE | Admit: 2017-07-18 | Discharge: 2017-07-18 | Disposition: A | Discharge status: Home / Self Care | Payer: BLUE CROSS/BLUE SHIELD | Source: Ambulatory Visit | Attending: General Surgery | Admitting: General Surgery

## 2017-07-18 DIAGNOSIS — C50412 Malignant neoplasm of upper-outer quadrant of left female breast: Secondary | ICD-10-CM | POA: Insufficient documentation

## 2017-07-18 DIAGNOSIS — Z885 Allergy status to narcotic agent status: Secondary | ICD-10-CM | POA: Insufficient documentation

## 2017-07-18 DIAGNOSIS — Z882 Allergy status to sulfonamides status: Secondary | ICD-10-CM | POA: Insufficient documentation

## 2017-07-18 DIAGNOSIS — Z452 Encounter for adjustment and management of vascular access device: Secondary | ICD-10-CM | POA: Diagnosis not present

## 2017-07-18 DIAGNOSIS — Z79899 Other long term (current) drug therapy: Secondary | ICD-10-CM | POA: Insufficient documentation

## 2017-07-18 DIAGNOSIS — Z91048 Other nonmedicinal substance allergy status: Secondary | ICD-10-CM | POA: Diagnosis not present

## 2017-07-18 DIAGNOSIS — E039 Hypothyroidism, unspecified: Secondary | ICD-10-CM | POA: Diagnosis not present

## 2017-07-18 DIAGNOSIS — E785 Hyperlipidemia, unspecified: Secondary | ICD-10-CM | POA: Diagnosis not present

## 2017-07-18 DIAGNOSIS — Z170 Estrogen receptor positive status [ER+]: Secondary | ICD-10-CM | POA: Diagnosis not present

## 2017-07-18 DIAGNOSIS — C50919 Malignant neoplasm of unspecified site of unspecified female breast: Secondary | ICD-10-CM | POA: Diagnosis not present

## 2017-07-18 HISTORY — PX: PORT-A-CATH REMOVAL: SHX5289

## 2017-07-18 SURGERY — REMOVAL PORT-A-CATH
Anesthesia: Monitor Anesthesia Care | Site: Chest

## 2017-07-18 MED ORDER — LIDOCAINE 2% (20 MG/ML) 5 ML SYRINGE
INTRAMUSCULAR | Status: AC
  Filled 2017-07-18: qty 10

## 2017-07-18 MED ORDER — 0.9 % SODIUM CHLORIDE (POUR BTL) OPTIME
TOPICAL | Status: DC | PRN
  Administered 2017-07-18: 1000 mL

## 2017-07-18 MED ORDER — GABAPENTIN 300 MG PO CAPS
300.0000 mg | ORAL_CAPSULE | ORAL | Status: AC
  Administered 2017-07-18: 300 mg via ORAL

## 2017-07-18 MED ORDER — PROPOFOL 10 MG/ML IV BOLUS
INTRAVENOUS | Status: DC | PRN
  Administered 2017-07-18: 15 mg via INTRAVENOUS

## 2017-07-18 MED ORDER — CHLORHEXIDINE GLUCONATE CLOTH 2 % EX PADS: 6.0000 | MEDICATED_PAD | Freq: Once | CUTANEOUS | Status: DC

## 2017-07-18 MED ORDER — PROPOFOL 1000 MG/100ML IV EMUL
INTRAVENOUS | Status: AC
  Filled 2017-07-18: qty 100

## 2017-07-18 MED ORDER — LIDOCAINE 2% (20 MG/ML) 5 ML SYRINGE
INTRAMUSCULAR | Status: AC
  Filled 2017-07-18: qty 5

## 2017-07-18 MED ORDER — LIDOCAINE 2% (20 MG/ML) 5 ML SYRINGE
INTRAMUSCULAR | Status: DC | PRN
  Administered 2017-07-18: 60 mg via INTRAVENOUS

## 2017-07-18 MED ORDER — NEOSTIGMINE METHYLSULFATE 5 MG/5ML IV SOSY
PREFILLED_SYRINGE | INTRAVENOUS | Status: AC
  Filled 2017-07-18: qty 5

## 2017-07-18 MED ORDER — HYDROCODONE-ACETAMINOPHEN 5-325 MG PO TABS: 1.0000 | ORAL_TABLET | ORAL | 0 refills | Status: DC | PRN

## 2017-07-18 MED ORDER — BUPIVACAINE-EPINEPHRINE (PF) 0.5% -1:200000 IJ SOLN
INTRAMUSCULAR | Status: AC
  Filled 2017-07-18: qty 30

## 2017-07-18 MED ORDER — MIDAZOLAM HCL 2 MG/2ML IJ SOLN
INTRAMUSCULAR | Status: AC
  Filled 2017-07-18: qty 2

## 2017-07-18 MED ORDER — PROPOFOL 500 MG/50ML IV EMUL
INTRAVENOUS | Status: DC | PRN
  Administered 2017-07-18: 75 ug/kg/min via INTRAVENOUS

## 2017-07-18 MED ORDER — PHENYLEPHRINE 40 MCG/ML (10ML) SYRINGE FOR IV PUSH (FOR BLOOD PRESSURE SUPPORT)
PREFILLED_SYRINGE | INTRAVENOUS | Status: DC | PRN
  Administered 2017-07-18: 80 ug via INTRAVENOUS
  Administered 2017-07-18: 40 ug via INTRAVENOUS
  Administered 2017-07-18: 80 ug via INTRAVENOUS

## 2017-07-18 MED ORDER — SUCCINYLCHOLINE CHLORIDE 200 MG/10ML IV SOSY
PREFILLED_SYRINGE | INTRAVENOUS | Status: AC
  Filled 2017-07-18: qty 10

## 2017-07-18 MED ORDER — FENTANYL CITRATE (PF) 100 MCG/2ML IJ SOLN
INTRAMUSCULAR | Status: DC | PRN
  Administered 2017-07-18: 50 ug via INTRAVENOUS

## 2017-07-18 MED ORDER — PHENYLEPHRINE 40 MCG/ML (10ML) SYRINGE FOR IV PUSH (FOR BLOOD PRESSURE SUPPORT)
PREFILLED_SYRINGE | INTRAVENOUS | Status: AC
  Filled 2017-07-18: qty 10

## 2017-07-18 MED ORDER — DEXAMETHASONE SODIUM PHOSPHATE 10 MG/ML IJ SOLN
INTRAMUSCULAR | Status: AC
  Filled 2017-07-18: qty 4

## 2017-07-18 MED ORDER — ONDANSETRON HCL 4 MG/2ML IJ SOLN
INTRAMUSCULAR | Status: AC
  Filled 2017-07-18: qty 4

## 2017-07-18 MED ORDER — CEFAZOLIN SODIUM-DEXTROSE 2-4 GM/100ML-% IV SOLN
INTRAVENOUS | Status: AC
  Filled 2017-07-18: qty 100

## 2017-07-18 MED ORDER — GABAPENTIN 300 MG PO CAPS
ORAL_CAPSULE | ORAL | Status: AC
  Administered 2017-07-18: 300 mg via ORAL
  Filled 2017-07-18: qty 1

## 2017-07-18 MED ORDER — ONDANSETRON HCL 4 MG/2ML IJ SOLN
INTRAMUSCULAR | Status: DC | PRN
  Administered 2017-07-18: 4 mg via INTRAVENOUS

## 2017-07-18 MED ORDER — MIDAZOLAM HCL 5 MG/5ML IJ SOLN
INTRAMUSCULAR | Status: DC | PRN
  Administered 2017-07-18: 2 mg via INTRAVENOUS

## 2017-07-18 MED ORDER — ACETAMINOPHEN 500 MG PO TABS
ORAL_TABLET | ORAL | Status: AC
  Administered 2017-07-18: 1000 mg via ORAL
  Filled 2017-07-18: qty 2

## 2017-07-18 MED ORDER — PHENYLEPHRINE HCL 10 MG/ML IJ SOLN: INTRAMUSCULAR | Status: DC | PRN

## 2017-07-18 MED ORDER — ACETAMINOPHEN 500 MG PO TABS
1000.0000 mg | ORAL_TABLET | ORAL | Status: AC
  Administered 2017-07-18: 1000 mg via ORAL

## 2017-07-18 MED ORDER — LACTATED RINGERS IV SOLN
INTRAVENOUS | Status: DC | PRN
  Administered 2017-07-18: 07:00:00 via INTRAVENOUS

## 2017-07-18 MED ORDER — FENTANYL CITRATE (PF) 250 MCG/5ML IJ SOLN
INTRAMUSCULAR | Status: AC
  Filled 2017-07-18: qty 5

## 2017-07-18 MED ORDER — PROPOFOL 10 MG/ML IV BOLUS
INTRAVENOUS | Status: AC
  Filled 2017-07-18: qty 20

## 2017-07-18 MED ORDER — ONDANSETRON HCL 4 MG/2ML IJ SOLN
INTRAMUSCULAR | Status: AC
  Filled 2017-07-18: qty 2

## 2017-07-18 MED ORDER — CEFAZOLIN SODIUM-DEXTROSE 2-4 GM/100ML-% IV SOLN
2.0000 g | INTRAVENOUS | Status: AC
  Administered 2017-07-18: 2 g via INTRAVENOUS

## 2017-07-18 MED ORDER — ROCURONIUM BROMIDE 10 MG/ML (PF) SYRINGE
PREFILLED_SYRINGE | INTRAVENOUS | Status: AC
  Filled 2017-07-18: qty 15

## 2017-07-18 MED ORDER — BUPIVACAINE-EPINEPHRINE (PF) 0.5% -1:200000 IJ SOLN
INTRAMUSCULAR | Status: DC | PRN
  Administered 2017-07-18: 10 mL via INTRAMUSCULAR

## 2017-07-18 MED ORDER — PHENYLEPHRINE 40 MCG/ML (10ML) SYRINGE FOR IV PUSH (FOR BLOOD PRESSURE SUPPORT)
PREFILLED_SYRINGE | INTRAVENOUS | Status: AC
  Filled 2017-07-18: qty 20

## 2017-07-18 MED ORDER — LIDOCAINE HCL (PF) 1 % IJ SOLN
INTRAMUSCULAR | Status: AC
  Filled 2017-07-18: qty 30

## 2017-07-18 SURGICAL SUPPLY — 32 items
BLADE SURG 15 STRL LF DISP TIS (BLADE) ×1 IMPLANT
BLADE SURG 15 STRL SS (BLADE) ×1
CHLORAPREP W/TINT 10.5 ML (MISCELLANEOUS) ×2 IMPLANT
COVER SURGICAL LIGHT HANDLE (MISCELLANEOUS) ×2 IMPLANT
DECANTER SPIKE VIAL GLASS SM (MISCELLANEOUS) ×4 IMPLANT
DERMABOND ADVANCED (GAUZE/BANDAGES/DRESSINGS) ×1
DERMABOND ADVANCED .7 DNX12 (GAUZE/BANDAGES/DRESSINGS) ×1 IMPLANT
DRAPE LAPAROTOMY 100X72 PEDS (DRAPES) ×2 IMPLANT
DRAPE UTILITY XL STRL (DRAPES) ×4 IMPLANT
ELECT CAUTERY BLADE 6.4 (BLADE) ×2 IMPLANT
ELECT REM PT RETURN 9FT ADLT (ELECTROSURGICAL) ×2
ELECTRODE REM PT RTRN 9FT ADLT (ELECTROSURGICAL) ×1 IMPLANT
GAUZE SPONGE 4X4 16PLY XRAY LF (GAUZE/BANDAGES/DRESSINGS) ×2 IMPLANT
GLOVE BIO SURGEON STRL SZ 6 (GLOVE) ×2 IMPLANT
GLOVE INDICATOR 6.5 STRL GRN (GLOVE) ×2 IMPLANT
GOWN STRL REUS W/ TWL LRG LVL3 (GOWN DISPOSABLE) ×1 IMPLANT
GOWN STRL REUS W/TWL 2XL LVL3 (GOWN DISPOSABLE) ×2 IMPLANT
GOWN STRL REUS W/TWL LRG LVL3 (GOWN DISPOSABLE) ×1
KIT BASIN OR (CUSTOM PROCEDURE TRAY) ×2 IMPLANT
KIT ROOM TURNOVER OR (KITS) ×2 IMPLANT
NEEDLE HYPO 25GX1X1/2 BEV (NEEDLE) ×4 IMPLANT
NS IRRIG 1000ML POUR BTL (IV SOLUTION) ×2 IMPLANT
PACK SURGICAL SETUP 50X90 (CUSTOM PROCEDURE TRAY) ×2 IMPLANT
PAD ARMBOARD 7.5X6 YLW CONV (MISCELLANEOUS) ×4 IMPLANT
PENCIL BUTTON HOLSTER BLD 10FT (ELECTRODE) ×2 IMPLANT
SUT MON AB 4-0 PC3 18 (SUTURE) ×2 IMPLANT
SUT VIC AB 3-0 SH 27 (SUTURE) ×1
SUT VIC AB 3-0 SH 27X BRD (SUTURE) ×1 IMPLANT
SYR BULB IRRIGATION 50ML (SYRINGE) ×2 IMPLANT
SYR CONTROL 10ML LL (SYRINGE) ×4 IMPLANT
TOWEL OR 17X24 6PK STRL BLUE (TOWEL DISPOSABLE) ×2 IMPLANT
TOWEL OR 17X26 10 PK STRL BLUE (TOWEL DISPOSABLE) ×2 IMPLANT

## 2017-07-18 NOTE — Interval H&P Note (Signed)
History and Physical Interval Note:  07/18/2017 7:34 AM  Jamie [name]  has presented today for surgery, with the diagnosis of BREAST CANCER  The various methods of treatment have been discussed with the patient and family. After consideration of risks, benefits and other options for treatment, the patient has consented to  Procedure(s): REMOVAL PORT-A-CATH (N/A) as a surgical intervention .  The patient's history has been reviewed, patient examined, no change in status, stable for surgery.  I have reviewed the patient's chart and labs.  Questions were answered to the patient's satisfaction.     [name]

## 2017-07-18 NOTE — Anesthesia Postprocedure Evaluation (Signed)
Anesthesia Post Note  Patient: Jamie [name]  Procedure(s) Performed: REMOVAL PORT-A-CATH (N/A Chest)     Patient location during evaluation: PACU Anesthesia Type: MAC Level of consciousness: awake and alert Pain management: pain level controlled Vital Signs Assessment: post-procedure vital signs reviewed and stable Respiratory status: spontaneous breathing, nonlabored ventilation, respiratory function stable and patient connected to nasal cannula oxygen Cardiovascular status: stable and blood pressure returned to baseline Postop Assessment: no apparent nausea or vomiting Anesthetic complications: no    Last Vitals:  Vitals:   07/18/17 0848 07/18/17 0857  BP: 106/62   Pulse: 61   Resp: (!) 9   Temp:  (!) 36.3 C  SpO2: 99%     Last Pain:  Vitals:   07/18/17 0548  TempSrc: Oral                 Keriann Rankin P Sarely Stracener

## 2017-07-18 NOTE — Op Note (Signed)
  PRE-OPERATIVE DIAGNOSIS:  un-needed Port-A-Cath for breast cancer  POST-OPERATIVE DIAGNOSIS:  Same   PROCEDURE:  Procedure(s):  REMOVAL RIGHT SUBCLAVIAN PORT-A-CATH  SURGEON:  Surgeon(s):  [name], MD  ANESTHESIA:   MAC + local  EBL:   Minimal  SPECIMEN:  None  Complications : none known  Procedure:   Pt was  identified in the holding area and taken to the operating room where she was placed supine on the operating room table.  MAC anesthesia was induced.  The RIGHT upper chest was prepped and draped.  The prior incision was anesthetized with local anesthetic.  The incision was opened with a #15 blade.  The subcutaneous tissue was divided with the cautery.  The port was identified and the capsule opened.  The four 2-0 prolene sutures were removed.  The port was then removed and pressure held on the tract.  The catheter appeared intact without evidence of breakage, length was 20 cm.  The wound was inspected for hemostasis, which was achieved with cautery.  The wound was closed with 3-0 vicryl deep dermal interrupted sutures and 4-0 Monocryl running subcuticular suture.  The wound was cleaned, dried, and dressed with dermabond.  The patient was awakened from anesthesia and taken to the PACU in stable condition.  Needle, sponge, and instrument counts are correct.

## 2017-07-18 NOTE — Anesthesia Procedure Notes (Signed)
Procedure Name: MAC Date/Time: 07/18/2017 7:45 AM Performed by: Harden Mo, CRNA Pre-anesthesia Checklist: Patient identified, Emergency Drugs available, Suction available and Patient being monitored Patient Re-evaluated:Patient Re-evaluated prior to induction Oxygen Delivery Method: Simple face mask Preoxygenation: Pre-oxygenation with 100% oxygen Induction Type: IV induction Placement Confirmation: positive ETCO2 and breath sounds checked- equal and bilateral Dental Injury: Teeth and Oropharynx as per pre-operative assessment

## 2017-07-18 NOTE — Transfer of Care (Signed)
Immediate Anesthesia Transfer of Care Note  Patient: Jamie [name]  Procedure(s) Performed: REMOVAL PORT-A-CATH (N/A Chest)  Patient Location: PACU  Anesthesia Type:MAC  Level of Consciousness: awake, alert  and oriented  Airway & Oxygen Therapy: Patient Spontanous Breathing  Post-op Assessment: Report given to RN, Post -op Vital signs reviewed and stable and Patient moving all extremities X 4  Post vital signs: Reviewed and stable  Last Vitals:  Vitals:   07/18/17 0547 07/18/17 0548  BP:  107/67  Pulse:  63  Resp:  20  Temp: 36.6 C   SpO2:  99%    Last Pain:  Vitals:   07/18/17 0548  TempSrc: Oral         Complications: No apparent anesthesia complications

## 2017-07-19 ENCOUNTER — Encounter (HOSPITAL_COMMUNITY): Payer: Self-pay | Admitting: General Surgery

## 2017-07-30 ENCOUNTER — Ambulatory Visit: Payer: BLUE CROSS/BLUE SHIELD | Admitting: Physical Therapy

## 2017-07-31 ENCOUNTER — Other Ambulatory Visit: Payer: Self-pay

## 2017-07-31 ENCOUNTER — Ambulatory Visit: Payer: BLUE CROSS/BLUE SHIELD | Attending: Obstetrics & Gynecology | Admitting: Physical Therapy

## 2017-07-31 ENCOUNTER — Encounter: Payer: Self-pay | Admitting: Physical Therapy

## 2017-07-31 DIAGNOSIS — Z170 Estrogen receptor positive status [ER+]: Secondary | ICD-10-CM | POA: Insufficient documentation

## 2017-07-31 DIAGNOSIS — C50412 Malignant neoplasm of upper-outer quadrant of left female breast: Secondary | ICD-10-CM | POA: Insufficient documentation

## 2017-07-31 DIAGNOSIS — R293 Abnormal posture: Secondary | ICD-10-CM | POA: Insufficient documentation

## 2017-07-31 DIAGNOSIS — M62838 Other muscle spasm: Secondary | ICD-10-CM | POA: Diagnosis not present

## 2017-07-31 NOTE — Therapy (Signed)
[facility] [address], [place], [name], [address] Phone: [phone]   Fax:  [phone]  Physical Therapy Evaluation  Patient Details  Name: Jamie [name] MRN: [id number] Date of Birth: 06-19-1955 Referring Provider: Dr. Silas Sacramento   Encounter Date: 07/31/2017  PT End of Session - 07/31/17 1313    Visit Number  1    Date for PT Re-Evaluation  09/25/17    Authorization Type  [employer]    Authorization - Visit Number  1    Authorization - Number of Visits  30    PT Start Time  0930    PT Stop Time  1010    PT Time Calculation (min)  40 min    Activity Tolerance  Patient tolerated treatment well    Behavior During Therapy  [facility] for tasks assessed/performed       Past Medical History:  Diagnosis Date  . Allergy   . Anemia   . Arthritis    feet  . Basal cell carcinoma   . Breast cancer ([place]) 06/2016   left breast  . Colon polyps   . Eczema   . Genetic testing 08/15/2016   Ms. Cornelio underwent genetic testing for hereditary cancer syndrome through [employer]'s 43-gene Common Hereditary Cancers Panel. Ms. 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 Result report is dated 08/15/2016. Please see genetic counseling documentation from 08/17/2016 for further discussion.  [name] History of radiation therapy 01/02/17-01/31/17   left breast was treated to 42.72 Gy in 16 fractions, left breast boost 10 Gy in 5 fractions  . Personal history of chemotherapy   . Personal history of radiation therapy   . PONV (postoperative nausea and vomiting)     Past Surgical History:  Procedure Laterality Date  . BONE SPUR Bilateral 2001 AND 1988  . BREAST BIOPSY    . BREAST LUMPECTOMY Left    07/2016  . BREAST LUMPECTOMY WITH RADIOACTIVE SEED AND SENTINEL LYMPH NODE BIOPSY Left 07/26/2016   Procedure: BREAST LUMPECTOMY WITH RADIOACTIVE SEED AND SENTINEL LYMPH NODE  BIOPSY;  Surgeon: [name], MD;  Location: [facility];  Service: General;  Laterality: Left;  . BUNIONECTOMY Bilateral 02/2012  . COLONOSCOPY W/ POLYPECTOMY  08/2007  . DILATION AND CURETTAGE OF UTERUS  2002  . MOHS SURGERY  2010  . PORT-A-CATH REMOVAL N/A 07/18/2017   Procedure: REMOVAL PORT-A-CATH;  Surgeon: [name], MD;  Location: [facility];  Service: General;  Laterality: N/A;  . PORTACATH PLACEMENT Right 07/26/2016   Procedure: INSERTION PORT-A-CATH;  Surgeon: [name], MD;  Location: [facility];  Service: General;  Laterality: Right;    There were no vitals filed for this visit.   Subjective Assessment - 07/31/17 0940    Subjective  No urinary leakage.  Patient feels tension in the pelvic floor, the area is unable to relax.  Patient was unable to have a vaginal exam due to the tightness in the vaginal canal.  Not intimate with husband and would like to. 7 to 8 years ago had pain with intercourse.     Patient Stated Goals  to vaginal exam, be intimate with husband    Currently in Pain?  Yes    Pain Score  8     Pain Location  Vagina    Pain Orientation  Mid    Pain Descriptors / Indicators  Tightness;Tender    Pain Type  Acute pain    Pain Onset  More than a month ago    Pain Frequency  Intermittent    Aggravating Factors   vaginal exam    Pain Relieving Factors  none    Multiple Pain Sites  No         [facility] PT Assessment - 07/31/17 0001      Assessment   Medical Diagnosis  R10.2  Pelvic pain    Referring Provider  Dr. Silas Sacramento    Onset Date/Surgical Date  06/21/16    Prior Therapy  none      Precautions   Precautions  Other (comment)    Precaution Comments  Breast cancer with radiation and chemotherapy      Restrictions   Weight Bearing Restrictions  No      Balance Screen   Has the patient fallen in the past 6 months  No    Has the patient had a decrease in activity level because of a fear of falling?   No    Is the  patient reluctant to leave their home because of a fear of falling?   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 editor residence      Prior Function   Level of Independence  Independent    Vocation  Retired    Leisure  yoga      Cognition   Overall Cognitive Status  Within Functional Limits for tasks assessed      Observation/Other Assessments   Focus on Therapeutic Outcomes (FOTO)   patient is limited by 40% due to not able to have a vaginal exam      Posture/Postural Control   Posture/Postural Control  No significant limitations      ROM / Strength   AROM / PROM / Strength  AROM;PROM;Strength      AROM   Overall AROM Comments  lumbar ROM is full      PROM   Right Hip External Rotation   35    Left Hip Flexion  120    Left Hip External Rotation   35      Right Hip   Right Hip Flexion  105      Palpation   SI assessment   pelvis in correct alignment             Objective measurements completed on examination: See above findings.    Pelvic Floor Special Questions - 07/31/17 0001    Urinary Leakage  No    Fecal incontinence  No strain to have a bowel movement    Skin Integrity  Intact dry    Pelvic Floor Internal Exam  Patient confirms identification and approves muscle assessment and treatment    Exam Type  Vaginal    Palpation  pinky finger fit               PT Education - 07/31/17 1312    Education provided  Yes    Education Details  what dilator kit would work, information on Manufacturing systems engineer) Educated  Patient    Methods  Explanation;Handout    Comprehension  Verbalized understanding       PT Short Term Goals - 07/31/17 1323      PT SHORT TERM GOAL #1   Title  independent with using the vaginal dilator to expand the vaginal canal    Time  4    Period  Weeks    Status  New    Target Date  08/28/17      PT SHORT TERM GOAL #2   Title  independent with hip stretches to improve tissue mobility    Time  4    Period  Weeks     Status  New    Target Date  08/28/17        PT Long Term Goals - 07/31/17 1324      PT LONG TERM GOAL #1   Title  independent with HEP and understand how to progress herself    Time  8    Period  Weeks    Status  New    Target Date  09/25/17      PT LONG TERM GOAL #2   Title  able to use the largest dilator with no pain greater than 3/10 due to improved tissue mobility    Time  8    Period  Weeks    Status  New    Target Date  09/25/17      PT LONG TERM GOAL #3   Title  able to have a vaginal exam with speculum due to improve opening of the vaginal exam    Time  8    Period  Weeks    Status  New    Target Date  09/25/17      PT LONG TERM GOAL #4   Title  understand correct toileting technique to reduce strain on the pelvic floor     Time  8    Period  Weeks    Status  New    Target Date  09/25/17             Plan - 07/31/17 1314    Clinical Impression Statement  Patient is a 62 year old female with pelvic floor tightness.  Patient s/p breast cancer with radiation and chemotherapy 06/2016.  Patient is on Anastrozole.  Patient has a vaginal exam but the physician was unable to use a speculam due to the vaginal introitus being too small and increased pain.  Patient has not been able to have penile penetration due to the pain and tissue restrictions. Patient introitus is the size of the therapist pinky finger and the therapist was able to place her finger into the introitus with pain.  Patient vaginal area is dry with atrophy.  Patient will benefit from skilled physical therapy to improve the vaginal opening so she is able to have a full vaginal exam and intercourse with her husband.     History and Personal Factors relevant to plan of care:  breast cancer 06/2016; taking Anastrozole    Clinical Presentation  Stable    Clinical Presentation due to:  stable condition    Clinical Decision Making  Low    Rehab Potential  Excellent    Clinical Impairments Affecting Rehab  Potential  breast cancer 06/2016; taking Anastrozole    PT Frequency  1x / week    PT Duration  8 weeks    PT Treatment/Interventions  Biofeedback;Therapeutic activities;Therapeutic exercise;Patient/family education;Neuromuscular re-education;Manual techniques;Dry needling education on using a dilator    PT Next Visit Plan  education on using a dilator, vaginal moisturizers, bulging of perineum, hip stretchs    PT Home Exercise Plan  progress as needed    Consulted and Agree with Plan of Care  Patient       Patient will benefit from skilled therapeutic intervention in order to improve the following deficits and impairments:  Increased  fascial restricitons, Pain, Increased muscle spasms, Decreased activity tolerance, Impaired flexibility  Visit Diagnosis: Other muscle spasm - Plan: PT plan of care cert/re-cert     Problem List Patient Active Problem List   Diagnosis Date Noted  . Basal cell carcinoma 07/17/2017  . Vaginismus 07/17/2017  . Abnormal TSH 03/28/2017  . Diarrhea 11/05/2016  . Port catheter in place 10/23/2016  . Genetic testing 08/15/2016  . Malignant neoplasm of upper-outer quadrant of left breast in female, estrogen receptor positive ([place]) 07/10/2016  . Allergic rhinitis 06/13/2016  . History of basal cell carcinoma 06/13/2016  . Eczema 06/13/2016  . Hyperlipemia 06/13/2016  . History of colon polyps 06/13/2016  . 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 RLS (restless legs syndrome) 06/13/2016  . Hypothyroidism 07/20/2013  . Bunion 01/21/2013  . Osteopenia 01/20/2013    Earlie Counts, PT 07/31/17 1:28 PM    [facility] [address] [place], [name], [address] Phone: [phone]   Fax:  [phone]  Name: Jamie [name] MRN: [id number] Date of Birth: January 07, 1956

## 2017-07-31 NOTE — Patient Instructions (Signed)
CalExotics Inspire Silicone Dilator Kit, Pink    Get amazon   Lubrication . Used for intercourse to reduce friction . Avoid ones that have glycerin, warming gels, tingling gels, icing or cooling gel, scented . Avoid parabens due to a preservative similar to female sex hormone . May need to be reapplied once or several times during sexual activity . Can be applied to both partners genitals prior to vaginal penetration to minimize friction or irritation . Prevent irritation and mucosal tears that cause post coital pain and increased the risk of vaginal and urinary tract infections . Oil-based lubricants cannot be used with condoms due to breaking them down.  Least likely to irritate vaginal tissue.  . Plant based-lubes are safe . Silicone-based lubrication are thicker and last long and used for post-menopausal women  Vaginal Lubricators Here is a list of some suggested lubricators you can use for intercourse. Use the most hypoallergenic product.  You can place on you or your partner.   Slippery Stuff  Sylk or Sliquid Natural H2O ( good  if frequent UTI's)  Blossom Organics ([email or web address]-organics.com)  Luvena   Coconut oil  PJur Woman Nude- water based lubricant, amazon  Uberlube- Amazon  Aloe Vera  Yes lubricant- Amazon  Wet Platinum-Silicone, Target, [facility]  Olive and Bee intimate cream-  www.oliveandbee.com.au Things to avoid in lubricants are glycerin, warming gels, tingling gels, icing or cooling  gels, and scented gels.  Also avoid Vaseline. KY jelly, Replens, and Astroglide kills good bacteria(lactobacilli)  Things to avoid in the vaginal area . Do not use things to irritate the vulvar area . No lotions- see below . No soaps; can use Aveeno or Calendula cleanser if needed. Must be gentle . No deodorants . No douches . Good to sleep without underwear to let the vaginal area to air out . No scrubbing: spread the lips to let warm water rinse over labias and pat  dry  Creams that can be used on the Vulva Area  V magic-amazon  Vital V Wild Yam Salve  Julva- Amazon  MoonMaid Botanical Pro-Meno Wild Yam Cream  Desert Havest Releveum or Desert Harvest Gele    [facility] [address], [address] [place], [place] [address] Phone # [phone] Fax [phone]  

## 2017-08-05 ENCOUNTER — Ambulatory Visit: Payer: BLUE CROSS/BLUE SHIELD | Admitting: Physical Therapy

## 2017-08-05 ENCOUNTER — Encounter: Payer: Self-pay | Admitting: Physical Therapy

## 2017-08-05 DIAGNOSIS — M62838 Other muscle spasm: Secondary | ICD-10-CM | POA: Diagnosis not present

## 2017-08-05 DIAGNOSIS — Z170 Estrogen receptor positive status [ER+]: Secondary | ICD-10-CM

## 2017-08-05 DIAGNOSIS — C50412 Malignant neoplasm of upper-outer quadrant of left female breast: Secondary | ICD-10-CM

## 2017-08-05 DIAGNOSIS — R293 Abnormal posture: Secondary | ICD-10-CM

## 2017-08-05 NOTE — Therapy (Addendum)
[facility] [address], [place], [name], [address] Phone: [phone]   Fax:  [phone]  Physical Therapy Treatment  Patient Details  Name: Jamie VIVERETTE MRN: [id number] Date of Birth: 01-29-1956 Referring Provider: Dr. Silas [place]   Encounter Date: 08/05/2017  PT End of Session - 08/05/17 1518    Visit Number  2    Date for PT Re-Evaluation  09/25/17    Authorization Type  BCBS    Authorization - Visit Number  2    Authorization - Number of Visits  30    PT Start Time  7622    PT Stop Time  1523    PT Time Calculation (min)  38 min    Activity Tolerance  Patient tolerated treatment well    Behavior During Therapy  [facility] for tasks assessed/performed       Past Medical History:  Diagnosis Date  . Allergy   . Anemia   . Arthritis    feet  . Basal cell carcinoma   . Breast cancer ([place]) 06/2016   left breast  . Colon polyps   . Eczema   . Genetic testing 08/15/2016   Ms. Baray underwent genetic testing for hereditary cancer syndrome through [employer]'s 43-gene Common Hereditary Cancers Panel. Ms. 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 Result report is dated 08/15/2016. Please see genetic counseling documentation from 08/17/2016 for further discussion.  [name] History of radiation therapy 01/02/17-01/31/17   left breast was treated to 42.72 Gy in 16 fractions, left breast boost 10 Gy in 5 fractions  . Personal history of chemotherapy   . Personal history of radiation therapy   . PONV (postoperative nausea and vomiting)     Past Surgical History:  Procedure Laterality Date  . BONE SPUR Bilateral 2001 AND 1988  . BREAST BIOPSY    . BREAST LUMPECTOMY Left    07/2016  . BREAST LUMPECTOMY WITH RADIOACTIVE SEED AND SENTINEL LYMPH NODE BIOPSY Left 07/26/2016   Procedure: BREAST LUMPECTOMY WITH RADIOACTIVE SEED AND SENTINEL LYMPH NODE BIOPSY;   Surgeon: [name], MD;  Location: [place];  Service: General;  Laterality: Left;  . BUNIONECTOMY Bilateral 02/2012  . COLONOSCOPY W/ POLYPECTOMY  08/2007  . DILATION AND CURETTAGE OF UTERUS  2002  . MOHS SURGERY  2010  . PORT-A-CATH REMOVAL N/A 07/18/2017   Procedure: REMOVAL PORT-A-CATH;  Surgeon: [name], MD;  Location: [facility] [place];  Service: General;  Laterality: N/A;  . PORTACATH PLACEMENT Right 07/26/2016   Procedure: INSERTION PORT-A-CATH;  Surgeon: [name], MD;  Location: [facility];  Service: General;  Laterality: Right;    There were no vitals filed for this visit.  Subjective Assessment - 08/05/17 1449    Subjective  I have the dilators. I am going on vacation this Wednesday and will be gone for 1 week.     Patient Stated Goals  to vaginal exam, be intimate with husband    Currently in Pain?  Yes    Pain Score  8     Pain Location  Vagina    Pain Orientation  Mid    Pain Descriptors / Indicators  Tightness;Tender    Pain Type  Acute pain    Pain Onset  More than a month ago    Pain Frequency  Intermittent    Aggravating Factors   vaginal exam    Pain Relieving Factors  none    Multiple Pain Sites  No                      [facility] Adult PT Treatment/Exercise - 08/05/17 0001      Self-Care   Self-Care  Other Self-Care Comments    Other Self-Care Comments   how to use the smallest dilator to expand the vaginal canal             PT Education - 08/05/17 1517    Education provided  Yes    Education Details  hip stretches and how to use the dilator    Person(s) Educated  Patient    Methods  Explanation;Demonstration;Verbal cues;Handout    Comprehension  Returned demonstration;Verbalized understanding       PT Short Term Goals - 08/05/17 1527      PT SHORT TERM GOAL #1   Title  independent with using the vaginal dilator to expand the vaginal canal    Baseline  just learned    Time  4    Period  Weeks     Status  On-going      PT SHORT TERM GOAL #2   Title  independent with hip stretches to improve tissue mobility    Baseline  just learned    Time  4    Period  Weeks    Status  On-going        PT Long Term Goals - 07/31/17 1324      PT LONG TERM GOAL #1   Title  independent with HEP and understand how to progress herself    Time  8    Period  Weeks    Status  New    Target Date  09/25/17      PT LONG TERM GOAL #2   Title  able to use the largest dilator with no pain greater than 3/10 due to improved tissue mobility    Time  8    Period  Weeks    Status  New    Target Date  09/25/17      PT LONG TERM GOAL #3   Title  able to have a vaginal exam with speculum due to improve opening of the vaginal exam    Time  8    Period  Weeks    Status  New    Target Date  09/25/17      PT LONG TERM GOAL #4   Title  understand correct toileting technique to reduce strain on the pelvic floor     Time  8    Period  Weeks    Status  New    Target Date  09/25/17            Plan - 08/05/17 1519    Rehab Potential  Excellent    Clinical Impairments Affecting Rehab Potential  breast cancer 06/2016; taking Anastrozole    PT Frequency  1x / week    PT Duration  8 weeks    PT Treatment/Interventions  Biofeedback;Therapeutic activities;Therapeutic exercise;Patient/family education;Neuromuscular re-education;Manual techniques;Dry needling education on using a dilator    PT Next Visit Plan  review on how to use a dilator, vaginal moisturizers, bulging of perineum, soft tissue    PT Home Exercise Plan  progress as needed    Recommended Other Services  MD signed note    Consulted and Agree with Plan of Care  Patient       Patient will benefit from  skilled therapeutic intervention in order to improve the following deficits and impairments:  Increased fascial restricitons, Pain, Increased muscle spasms, Decreased activity tolerance, Impaired flexibility  Visit Diagnosis: Other muscle  spasm  Carcinoma of upper-outer quadrant of left breast in female, estrogen receptor positive (HCC)  Abnormal posture     Problem List Patient Active Problem List   Diagnosis Date Noted  . Basal cell carcinoma 07/17/2017  . Vaginismus 07/17/2017  . Abnormal TSH 03/28/2017  . Diarrhea 11/05/2016  . Port catheter in place 10/23/2016  . Genetic testing 08/15/2016  . Malignant neoplasm of upper-outer quadrant of left breast in female, estrogen receptor positive ([place]) 07/10/2016  . Allergic rhinitis 06/13/2016  . History of basal cell carcinoma 06/13/2016  . Eczema 06/13/2016  . Hyperlipemia 06/13/2016  . History of colon polyps 06/13/2016  . 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 RLS (restless legs syndrome) 06/13/2016  . Hypothyroidism 07/20/2013  . Bunion 01/21/2013  . Osteopenia 01/20/2013    Earlie Counts, PT 08/05/17 3:29 PM   [facility] [address] [place], [name], [address] Phone: [phone]   Fax:  [phone]  Name: IRETA PULLMAN MRN: [id number] Date of Birth: Feb 02, 1956 PHYSICAL THERAPY DISCHARGE SUMMARY  Visits from Start of Care: 2  Current functional level related to goals / functional outcomes: See above. Patient called on 08/20/2015.  She wanted to be discharged due to going to another physical therapy facility for treatment on her back that is closer to her home.    Remaining deficits: See above.    Education / Equipment: HEP Plan: Patient agrees to discharge.  Patient goals were not met. Patient is being discharged due to the patient's request. Thank you for the referral. Earlie Counts, PT 08/19/17 11:14 AM   ?????

## 2017-08-05 NOTE — Patient Instructions (Addendum)
Piriformis Stretch, Sitting    Sit, one ankle on opposite knee, same-side hand on crossed knee. Push down on knee, keeping spine straight. Lean torso forward, with flat back, until tension is felt in hamstrings and gluteals of crossed-leg side. Hold _30__ seconds.  Repeat __2_ times per session. Do _1__ sessions per day.  Copyright  VHI. All rights reserved.  Chair Sitting    Sit at edge of seat, spine straight, one leg extended. Put a hand on each thigh and bend forward from the hip, keeping spine straight. Allow hand on extended leg to reach toward toes. Support upper body with other arm. Hold __30_ seconds. Repeat __2_ times per session. Do _1__ sessions per day.  Copyright  VHI. All rights reserved.  Butterfly, Supine    Lie on back, feet together. Lower knees toward floor. Hold __30-60_ seconds. Repeat __1-2_ times per session. Do __1_ sessions per day. Do belly breathing as you are doing the stretch.  Copyright  VHI. All rights reserved.  Supine Knee-to-Chest, Unilateral    Lie on back, hands clasped behind the right  knee. Pull right knee in toward chest until a comfortable stretch is felt in lower back and buttocks.while pressing the left knee down and push foot away from the body.  Hold _30__ seconds.  Then reverse.  Repeat __2_ times per session. Do _1__ sessions per day.  Copyright  VHI. All rights reserved.     1. Position yourself as shown, grabbing onto the feet or behind the knees; you should feel a gentle stretch.  2. Breathe in and allow the pelvic floor muscles to relax.  3. Hold this position for 2-3 minutes.  Cat / Cow Flow    Inhale, press spine toward ceiling like a Halloween cat. Keeping strength in arms and abdominals, exhale to soften spine through neutral and into cow pose. Open chest and arch back. Initiate movement between cat and cow at tailbone, one vertebrae at a time. Repeat __15__ times.  Copyright  VHI. All rights reserved.  BACK:  Child's Pose (Sciatica)    Sit in knee-chest position and reach arms forward. Separate knees for comfort. Hold position for 30___ breaths. Repeat __2_ times. Do _1__ times per day.  Copyright  VHI. All rights reserved.   Therapeutic - Prone Press-Up    Push up so chest is off floor and back is arched. Do not raise hips. Hold _10___ seconds. Return to prone position. Repeat __2__ times.  Copyright  VHI. All rights reserved.  PROTOCOL FOR DILATORS   1. Wash dilator with soap and water prior to insertion.    2. Lay on your back reclined. Knees are to be up and apart while on your bed or in the bathtub with warm water.   3. Lubricate the end of the dilator with a water-soluble lubricant.  4. Separate the labia.   5. Tense the pelvic floor muscles than relax; while relaxing, slide lubricated dilator into the vagina.    6. Tense muscles again while holding the dilator so it does not get pushed out; relax and slide it in a little further.   7. Try blowing out as if filling a balloon; this may relax the muscles and allow penetration.  Repeat blowing out to insert dilator further.  8. Keep dilator in for 5-10 minutes if tolerate, with the pelvic floor muscles relaxed to further stretch the canal.   9. Never force the dilator into the canal.  10. 1-2 times per day 11. Can  do in a bath 12. Move in and out also move side to side with hips in different positions  [facility] [address] [place], [place] [address] Phone # [phone] Fax [phone]

## 2017-08-06 ENCOUNTER — Ambulatory Visit (HOSPITAL_COMMUNITY)
Admission: RE | Admit: 2017-08-06 | Discharge: 2017-08-06 | Disposition: A | Discharge status: Home / Self Care | Payer: BLUE CROSS/BLUE SHIELD | Source: Ambulatory Visit | Attending: Osteopathic Medicine | Admitting: Osteopathic Medicine

## 2017-08-06 ENCOUNTER — Other Ambulatory Visit: Payer: Self-pay

## 2017-08-06 ENCOUNTER — Ambulatory Visit (HOSPITAL_BASED_OUTPATIENT_CLINIC_OR_DEPARTMENT_OTHER)
Admission: RE | Admit: 2017-08-06 | Discharge: 2017-08-06 | Disposition: A | Discharge status: Home / Self Care | Payer: BLUE CROSS/BLUE SHIELD | Source: Ambulatory Visit | Attending: Cardiology | Admitting: Cardiology

## 2017-08-06 ENCOUNTER — Encounter (HOSPITAL_COMMUNITY): Payer: Self-pay | Admitting: Cardiology

## 2017-08-06 VITALS — BP 107/60 | HR 60 | Wt 147.8 lb

## 2017-08-06 DIAGNOSIS — C50412 Malignant neoplasm of upper-outer quadrant of left female breast: Secondary | ICD-10-CM | POA: Diagnosis not present

## 2017-08-06 DIAGNOSIS — Z170 Estrogen receptor positive status [ER+]: Secondary | ICD-10-CM

## 2017-08-06 DIAGNOSIS — E785 Hyperlipidemia, unspecified: Secondary | ICD-10-CM | POA: Insufficient documentation

## 2017-08-06 NOTE — Progress Notes (Signed)
Oncology: Dr. [name]  62 yo with history of breast cancer presents for cardio-oncology followup.  Left breast cancer was diagnosed in 2/18, ER+/PR-/HER2+.  She had lumpectomy in 3/18 followed by Abraxane 12 cycles.  She has recently completed 1 year of Herceptin.     She has been doing well.  No exertional dyspnea or chest pain.    PMH: 1. Breast cancer: Left breast cancer diagnosed in 2/18, ER+/PR-/HER2+.  She had lumpectomy in 3/18 followed by Abraxane 12 cycles.  She will get Herceptin x 1 year to start later this month.  - Echo (3/18): EF 60-65%, GLS -21.8%.  - Echo (6/18): EF 60-65%, GLS -23.5% - Echo (9/18): EF 55-20%, normal diastolic function, GLS -80.2%, normal RV size and systolic function.  - Echo (12/18): EF 55-60%, GLS -20.4%, normal RV size and systolic function.  - Echo (3/19): EF 55-60%, GLS -20.1%, normal RV size and systolic function.   Social History   Socioeconomic History  . Marital status: Married    Spouse name: Not on file  . Number of children: 1  . Years of education: Not on file  . Highest education level: Not on file  Social Needs  . Financial resource strain: Not on file  . Food insecurity - worry: Not on file  . Food insecurity - inability: Not on file  . Transportation needs - medical: Not on file  . Transportation needs - non-medical: Not on file  Occupational History  . Occupation: retired  Tobacco Use  . Smoking status: Never Smoker  . Smokeless tobacco: Never Used  Substance and Sexual Activity  . Alcohol use: Yes    Alcohol/week: 0.0 oz    Comment: 1-2  . Drug use: No  . Sexual activity: Yes    Birth control/protection: Post-menopausal  Other Topics Concern  . Not on file  Social History Narrative   I biological son and 1 adopted daughter   Family History  Problem Relation Age of Onset  . Ovarian cancer Mother 64  . Hypertension Father   . Stroke Father   . Heart disease Father   . Breast cancer Maternal Aunt 77       recurred  at 60  . Heart disease Paternal Grandmother   . Osteoporosis Sister   . Liver cancer Maternal Uncle 77   ROS: All systems reviewed and negative except as per HPI.   Current Outpatient Medications  Medication Sig Dispense Refill  . AMBULATORY NON FORMULARY MEDICATION 1. Upper extremity compression sleeve. 2. Compression glove. Dx: Lymphedema. Size and fit as needed. 4 Units prn  . anastrozole (ARIMIDEX) 1 MG tablet Take 1 tablet (1 mg total) by mouth daily. 30 tablet 12  . BIOTIN PO Take 1 tablet by mouth daily.    . calcium-vitamin D (OSCAL WITH D) 500-200 MG-UNIT tablet Take 1 tablet by mouth 2 (two) times daily.    . cetirizine (ZYRTEC) 10 MG tablet Take 10 mg by mouth daily as needed (for allergies--Spring/Summer).     . Cholecalciferol (VITAMIN D3) 1000 units CAPS Take 1,000 Units by mouth daily.     . Cyanocobalamin (B-12) 2500 MCG TABS Place 2,500 mcg under the tongue daily.     . diphenhydrAMINE (BENADRYL) 25 MG tablet Take 12.5 mg by mouth at bedtime as needed for sleep.    [name] HYDROcodone-acetaminophen (NORCO/VICODIN) 5-325 MG tablet Take 1 tablet by mouth every 4 (four) hours as needed for moderate pain or severe pain. 10 tablet 0  . Lactobacillus (ACIDOPHILUS  PROBIOTIC PO) Take 1 capsule by mouth daily.     [name] lidocaine-prilocaine (EMLA) cream Apply one application to port 1-2 hours prior to access. Cover with saran wrap. 30 g 3  . Multiple Vitamin (MULTIVITAMIN) tablet Take 1 tablet by mouth daily.     No current facility-administered medications for this encounter.    BP 107/60   Pulse 60   Wt 147 lb 12 oz (67 kg)   SpO2 100%   BMI 24.59 kg/m  General: NAD Neck: No JVD, no thyromegaly or thyroid nodule.  Lungs: Clear to auscultation bilaterally with normal respiratory effort. CV: Nondisplaced PMI.  Heart regular S1/S2, no S3/S4, no murmur.  No peripheral edema.  No carotid bruit.  Normal pedal pulses.  Abdomen: Soft, nontender, no hepatosplenomegaly, no distention.  Skin:  Intact without lesions or rashes.  Neurologic: Alert and oriented x 3.  Psych: Normal affect. Extremities: No clubbing or cyanosis.  HEENT: Normal.   Assessment/Plan: 62 yo with breast cancer, ER+/PR-/HER2+. She completed Herceptin in 2/19. I reviewed today's echo, EF and strain pattern look normal.  This will be her last echo. She can followup with [place] as needed.   Loralie Champagne 08/06/2017

## 2017-08-06 NOTE — Progress Notes (Signed)
  Echocardiogram 2D Echocardiogram has been performed.  Jamie [name] 08/06/2017, 11:55 AM

## 2017-08-13 ENCOUNTER — Encounter: Payer: BLUE CROSS/BLUE SHIELD | Admitting: Physical Therapy

## 2017-08-16 ENCOUNTER — Encounter: Payer: Self-pay | Admitting: Family Medicine

## 2017-08-16 ENCOUNTER — Ambulatory Visit (INDEPENDENT_AMBULATORY_CARE_PROVIDER_SITE_OTHER): Payer: BLUE CROSS/BLUE SHIELD | Admitting: Family Medicine

## 2017-08-16 ENCOUNTER — Ambulatory Visit (INDEPENDENT_AMBULATORY_CARE_PROVIDER_SITE_OTHER): Payer: BLUE CROSS/BLUE SHIELD

## 2017-08-16 VITALS — BP 95/68 | HR 74 | Temp 98.1°F | Wt 150.0 lb

## 2017-08-16 DIAGNOSIS — M25551 Pain in right hip: Secondary | ICD-10-CM

## 2017-08-16 DIAGNOSIS — C50412 Malignant neoplasm of upper-outer quadrant of left female breast: Secondary | ICD-10-CM

## 2017-08-16 DIAGNOSIS — Z170 Estrogen receptor positive status [ER+]: Secondary | ICD-10-CM | POA: Diagnosis not present

## 2017-08-16 DIAGNOSIS — G8929 Other chronic pain: Secondary | ICD-10-CM | POA: Diagnosis not present

## 2017-08-16 DIAGNOSIS — M549 Dorsalgia, unspecified: Secondary | ICD-10-CM

## 2017-08-16 DIAGNOSIS — M545 Low back pain, unspecified: Secondary | ICD-10-CM

## 2017-08-16 DIAGNOSIS — M160 Bilateral primary osteoarthritis of hip: Secondary | ICD-10-CM | POA: Diagnosis not present

## 2017-08-16 LAB — POCT URINALYSIS DIPSTICK
Bilirubin, UA: NEGATIVE
Blood, UA: NEGATIVE
GLUCOSE UA: NEGATIVE
Ketones, UA: NEGATIVE
LEUKOCYTES UA: NEGATIVE
Nitrite, UA: NEGATIVE
Protein, UA: NEGATIVE
Spec Grav, UA: 1.015 (ref 1.010–1.025)
Urobilinogen, UA: 0.2 E.U./dL
pH, UA: 7 (ref 5.0–8.0)

## 2017-08-16 NOTE — Patient Instructions (Signed)
Thank you for coming in today. Attend PT.  Use a heating pad as needed.  Ibuprofen or aleve is ok.  If you have to take ibuprofen or aleve for longer than a few days in a row take zantac or Pepcid or Prilosec with it to prevent ulcers.   Recheck in 6 weeks for hip and back pain.

## 2017-08-16 NOTE — Progress Notes (Signed)
Subjective:    I'm seeing this patient as a consultation for:  Jamie Reeve, DO   CC: Back pain  HPI: Dianely notes chronic waxing and waning back pain and right hip pain ongoing now for years.  It has been worsened over the last 4 months or so.  She denies any injury.  She notes the back pain is felt at the bilateral lower back and does not radiate.  She notes the hip pain is typically lateral hip pain without radiation.  Both the back and the hip pain are worse when she stands from a seated position.  She notes some right hip pain when she lies on her right side.  She denies any weakness or numbness or loss of function bowel or bladder dysfunction.  Her medical history is pertinent for a diagnosis of left breast cancer.  A diagnosis T1b N0 M0 ER positive PR negative H ER 2+.  She completed lumpectomy, radiation therapy and chemotherapy.  She has been taking oral anastrozole daily for about a half a year.  Her follow-up scans have been reassuringly normal. .  Past medical history, Surgical history, Family history not pertinant except as noted below, Social history, Allergies, and medications have been entered into the medical record, reviewed, and no changes needed.   Review of Systems: No headache, visual changes, nausea, vomiting, diarrhea, constipation, dizziness, abdominal pain, skin rash, fevers, chills, night sweats, weight loss, swollen lymph nodes, body aches, joint swelling, muscle aches other than described above, chest pain, shortness of breath, mood changes, visual or auditory hallucinations.   Objective:    Vitals:   08/16/17 0850  BP: 95/68  Pulse: 74  Temp: 98.1 F (36.7 C)   General: Well Developed, well nourished, and in no acute distress.  Neuro/Psych: Alert and oriented x3, extra-ocular muscles intact, able to move all 4 extremities, sensation grossly intact. Skin: Warm and dry, no rashes noted.  Respiratory: Not using accessory muscles, speaking in full  sentences, trachea midline.  Cardiovascular: Pulses palpable, no extremity edema. Abdomen: Does not appear distended. MSK:  L-spine: Nontender to spinal midline.  Minimally tender bilateral lumbar paraspinal muscles. Motion is intact and normal Lower extremity strength is intact except described below Reflexes and sensation are equal normal throughout  Right hip: Normal-appearing Normal motion Mildly tender to palpation greater trochanter. Hip abduction strength 4+/5   Left hip: Normal-appearing Normal motion Nontender Hip abduction strength 4+/5  Leg length: Left leg is approximately 0.5 cm longer than right  Normal gait    Results for orders placed or performed in visit on 08/16/17 (from the past 24 hour(s))  POCT Urinalysis Dipstick     Status: None   Collection Time: 08/16/17  8:55 AM  Result Value Ref Range   Color, UA yellow    Clarity, UA clear    Glucose, UA negative    Bilirubin, UA negative    Ketones, UA negative    Spec Grav, UA 1.015 1.010 - 1.025   Blood, UA negative    pH, UA 7.0 5.0 - 8.0   Protein, UA negative    Urobilinogen, UA 0.2 0.2 or 1.0 E.U./dL   Nitrite, UA negative    Leukocytes, UA Negative Negative   Appearance     Odor     Dg Lumbar Spine Complete  Result Date: 08/16/2017 CLINICAL DATA:  Low back pain EXAM: LUMBAR SPINE - COMPLETE 4+ VIEW COMPARISON:  None. FINDINGS: Normal alignment. No fracture. Disc spaces are maintained. SI joints are  symmetric and unremarkable. IMPRESSION: No acute bony abnormality. Electronically Signed   By: Rolm Baptise M.D.   On: 08/16/2017 09:36   Dg Hip Unilat With Pelvis 2-3 Views Right  Result Date: 08/16/2017 CLINICAL DATA:  Limited flexibility right hip four years. EXAM: DG HIP (WITH OR WITHOUT PELVIS) 2-3V RIGHT COMPARISON:  None. FINDINGS: Minimal symmetric degenerative change of the hips. No evidence of fracture or dislocation. Mild degenerative change of the spine. IMPRESSION: No acute findings.  Mild symmetric degenerative change of the hips. Electronically Signed   By: Marin Olp M.D.   On: 08/16/2017 09:38  I personally (independently) visualized and performed the interpretation of the images attached in this note.   Impression and Recommendations:    Assessment and Plan: 62 y.o. female with  Exacerbation of low back pain: Mild degenerative changes on x-ray.  Back pain is very likely related to myofascial weakness and dysfunction and spasm.  She does have a history of recent breast cancer over the recent x-ray was negative for metastasis.  Anastrozole can also cause myalgia however the timing of the anastrozole is not equal to the onset of her worsening back pain.  However this is a potential contributor to her back pain. Plan for trial of physical therapy.  Right lateral hip pain: Trochanteric bursitis, versus hip abductor tendinitis. Plan for physical therapy and home exercise program.  Recheck in 6 weeks.   Orders Placed This Encounter  Procedures  . DG Lumbar Spine Complete    Standing Status:   Future    Number of Occurrences:   1    Standing Expiration Date:   10/17/2018    Order Specific Question:   Reason for Exam (SYMPTOM  OR DIAGNOSIS REQUIRED)    Answer:   eval chronic low back pain. Hx breast cancer    Order Specific Question:   Preferred imaging location?    Answer:   [facility]    Order Specific Question:   Radiology Contrast Protocol - do NOT remove file path    Answer:   \\[email or web address]  . DG HIP UNILAT WITH PELVIS 2-3 VIEWS RIGHT    Standing Status:   Future    Number of Occurrences:   1    Standing Expiration Date:   10/17/2018    Order Specific Question:   Reason for Exam (SYMPTOM  OR DIAGNOSIS REQUIRED)    Answer:   eval right hip pain    Order Specific Question:   Preferred imaging location?    Answer:   [facility]    Order Specific Question:   Radiology Contrast Protocol - do NOT  remove file path    Answer:   \\[email or web address]  . Ambulatory referral to Physical Therapy    Referral Priority:   Routine    Referral Type:   Physical Medicine    Referral Reason:   Specialty Services Required    Requested Specialty:   Physical Therapy  . POCT Urinalysis Dipstick   No orders of the defined types were placed in this encounter.   Discussed warning signs or symptoms. Please see discharge instructions. Patient expresses understanding.

## 2017-08-19 ENCOUNTER — Encounter: Payer: BLUE CROSS/BLUE SHIELD | Admitting: Physical Therapy

## 2017-08-26 ENCOUNTER — Encounter: Payer: BLUE CROSS/BLUE SHIELD | Admitting: Physical Therapy

## 2017-08-26 ENCOUNTER — Ambulatory Visit (INDEPENDENT_AMBULATORY_CARE_PROVIDER_SITE_OTHER): Payer: BLUE CROSS/BLUE SHIELD | Admitting: Rehabilitative and Restorative Service Providers"

## 2017-08-26 ENCOUNTER — Encounter: Payer: Self-pay | Admitting: Rehabilitative and Restorative Service Providers"

## 2017-08-26 DIAGNOSIS — M5441 Lumbago with sciatica, right side: Secondary | ICD-10-CM

## 2017-08-26 DIAGNOSIS — R293 Abnormal posture: Secondary | ICD-10-CM

## 2017-08-26 DIAGNOSIS — R29898 Other symptoms and signs involving the musculoskeletal system: Secondary | ICD-10-CM

## 2017-08-26 NOTE — Therapy (Addendum)
[facility] [place], [name], [address] Phone: [phone]   Fax:  [phone]  Physical Therapy Evaluation  Patient Details  Name: Jamie [name] MRN: [id number] Date of Birth: 16-Nov-1955 Referring Provider: Dr Lynne Leader   Encounter Date: 08/26/2017  PT End of Session - 08/26/17 1300    Visit Number  1    Number of Visits  12    Date for PT Re-Evaluation  10/07/17    Authorization - Visit Number  3    Authorization - Number of Visits  30    PT Start Time  7616    PT Stop Time  0737    PT Time Calculation (min)  68 min    Activity Tolerance  Patient tolerated treatment well       Past Medical History:  Diagnosis Date  . Allergy   . Anemia   . Arthritis    feet  . Basal cell carcinoma   . Breast cancer ([place]) 06/2016   left breast  . Colon polyps   . Eczema   . Genetic testing 08/15/2016   Ms. Garnett underwent genetic testing for hereditary cancer syndrome through [employer]'s 43-gene Common Hereditary Cancers Panel. Ms. 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 Result report is dated 08/15/2016. Please see genetic counseling documentation from 08/17/2016 for further discussion.  [name] History of radiation therapy 01/02/17-01/31/17   left breast was treated to 42.72 Gy in 16 fractions, left breast boost 10 Gy in 5 fractions  . Personal history of chemotherapy   . Personal history of radiation therapy   . PONV (postoperative nausea and vomiting)     Past Surgical History:  Procedure Laterality Date  . BONE SPUR Bilateral 2001 AND 1988  . BREAST BIOPSY    . BREAST LUMPECTOMY Left    07/2016  . BREAST LUMPECTOMY WITH RADIOACTIVE SEED AND SENTINEL LYMPH NODE BIOPSY Left 07/26/2016   Procedure: BREAST LUMPECTOMY WITH RADIOACTIVE SEED AND SENTINEL LYMPH NODE BIOPSY;  Surgeon: [name], MD;  Location: [facility];   Service: General;  Laterality: Left;  . BUNIONECTOMY Bilateral 02/2012  . COLONOSCOPY W/ POLYPECTOMY  08/2007  . DILATION AND CURETTAGE OF UTERUS  2002  . MOHS SURGERY  2010  . PORT-A-CATH REMOVAL N/A 07/18/2017   Procedure: REMOVAL PORT-A-CATH;  Surgeon: [name], MD;  Location: [facility];  Service: General;  Laterality: N/A;  . PORTACATH PLACEMENT Right 07/26/2016   Procedure: INSERTION PORT-A-CATH;  Surgeon: [name], MD;  Location: [facility];  Service: General;  Laterality: Right;    There were no vitals filed for this visit.   Subjective Assessment - 08/26/17 1158    Subjective  Patient reports that she has had LBP since Christmas time. She has a sensation of the LB "locking up". She has increased pain if she is bending forward and then stands up again. She has had recurrent LBP for ~ 10 years with episodic flare ups of pain in the LB and stiffness in the Rt hip. She has tried stretching; working in [employer]; etc and can't relieve the symptoms this time.     Pertinent History  Breast cancer 2018 - now cancer free - she finished chemo 07/17/17; tight/stiff Rt hip; neck stiffness; bilat foot surgery last time 2013 - 2x Rt; 1x Lt - ridgid great toe     How long can you sit comfortably?  1 hour    How long can you stand comfortably?  1 hour     How long can you walk comfortably?  no limit     Diagnostic tests  Xrays WNL's     Patient Stated Goals  get rid of the back pain and be able to do her normal activities     Currently in Pain?  Yes    Pain Score  2  moves to change positions 7/10 - increased with increased activity 8-9/10 at the end of the day with increased activities     Pain Location  Back    Pain Orientation  Lower    Pain Descriptors / Indicators  Tightness;Aching constant aching Rt hip radiating up the side     Pain Type  Acute pain;Chronic pain    Pain Radiating Towards  Rt hip and Rt lateral side     Pain Onset  More than a month ago    Pain Frequency   Constant    Aggravating Factors   prolonged postures then moving into standing; bent forward postures in standing; heavy lifting     Pain Relieving Factors  topical heat patch; heat; meds?; lying on back; sometimes stretching; sleep          [facility] PT Assessment - 08/26/17 0001      Assessment   Medical Diagnosis  LBP; Rt hip pain     Referring Provider  Dr Lynne Leader    Onset Date/Surgical Date  05/04/17    Hand Dominance  Right    Next MD Visit  09/27/17    Prior Therapy  yes for pelvic floor dysfunction       Precautions   Precautions  Other (comment)    Precaution Comments  Breast cancer with radiation and chemotherapy no modalities       Balance Screen   Has the patient fallen in the past 6 months  No    Has the patient had a decrease in activity level because of a fear of falling?   No    Is the patient reluctant to leave their home because of a fear of falling?   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     Leisure  household chores; crafts; sewing; yoga; gym 3-5 days/wk cardio; yoga; zumba - walking 3 days/wk 50 min outside - hills       Observation/Other Assessments   Focus on Therapeutic Outcomes (FOTO)   39% limitation       Sensation   Additional Comments  intermittent numbness Rt toes to foot into calf and posterior thigh since foot surgery 2013       Posture/Postural Control   Posture Comments  sits with posterior pelvic tilt/flattened lumbar spine;       AROM   Right/Left Hip  -- tight hip extension Rt > Lt; tight rotation Lt > Rt     Lumbar Flexion  95%    Lumbar Extension  65%    Lumbar - Right Side Bend  85%    Lumbar - Left Side Bend  80% pulling    Lumbar - Right Rotation  40%    Lumbar - Left Rotation  45% pulling       Flexibility   Hamstrings  tight Rt 80 deg; Lt 85 deg      Quadriceps  tight Rt     ITB  sight tightness Rt     Piriformis  tight Rt       Palpation   SI assessment   Lt PSIS and hemipelvis elevated     Palpation comment  muscular tightness noted bilat psoas; Lt lumbar paraspinals/QL; Rt piriformis and hip abductors       Ambulation/Gait   Gait Comments  WFL's no notable limp                 Objective measurements completed on examination: See above findings.      [facility] Adult PT Treatment/Exercise - 08/26/17 0001      Lumbar Exercises: Stretches   Passive Hamstring Stretch  Right;2 reps;30 seconds supine with strap     Hip Flexor Stretch  Right;Left;2 reps;30 seconds supine thomas     Press Ups  -- 2 sec x 10 reps painfree range     Quad Stretch  Right;2 reps;30 seconds prone with strap     Piriformis Stretch  Right;2 reps;30 seconds supine travell       Modalities   Modalities  -- no modalities - hx of cancer treatment finished 2/19       Moist Heat Therapy   Number Minutes Moist Heat  15 Minutes    Moist Heat Location  Lumbar Spine             PT Education - 08/26/17 1256    Education provided  Yes    Education Details  HEP DN    Person(s) Educated  Patient    Methods  Explanation;Demonstration;Tactile cues;Verbal cues;Handout    Comprehension  Verbalized understanding;Returned demonstration;Verbal cues required;Tactile cues required       PT Short Term Goals - 08/05/17 1527      PT SHORT TERM GOAL #1   Title  independent with using the vaginal dilator to expand the vaginal canal    Baseline  just learned    Time  4    Period  Weeks    Status  On-going      PT SHORT TERM GOAL #2   Title  independent with hip stretches to improve tissue mobility    Baseline  just learned    Time  4    Period  Weeks    Status  On-going        PT Long Term Goals - 08/26/17 1312      PT LONG TERM GOAL #1   Title  Improve pelvic alignment with patient to demonstrate equal height through PSIS and pelvis 10/07/17     Time  6    Period  Weeks    Status  New    Target Date  10/07/17      PT LONG TERM GOAL #2   Title  Decrease pain in LB and Rt hip by 50-75% allowing patient to perform normal functional activities with greater ease and without "catch" in LB 10/07/17    Time  6    Period  Weeks    Status  New    Target Date  10/07/17      PT LONG TERM GOAL #3   Title  Patient to tolerated increased periods of sitting and return to stand with minimal to no pain 10/07/17    Time  6    Period  Weeks    Status  New      PT  LONG TERM GOAL #4   Title  Independent in HEP 10/07/17    Time  6    Period  Weeks    Status  New    Target Date  10/07/17      PT LONG TERM GOAL #5   Title  Improve FOTO to </= 32% limitation 10/07/17     Time  6    Period  Weeks    Status  New             Plan - 08/26/17 1301    Clinical Impression Statement  Patient presents with 4 month history of LBP and Rt hip pain. She has a history of recurrent LBP and Rt hip pain for the past 10 years. Patient has tried numerous treatments for LBP with temporary improvement. Symptoms are increased with prolonged bent forward postures and changes in position as well as lifting activities. She presents with increased lumbar lordosis in sitting and slightly so in standing. Lt hemipelvis is elevated compared to Rt. She hsa limited trunk and LE mobility and ROM; muscular tightness to palpation; pain with functional activities. Patient has been previously evaluated for pelvic floor dysfunction (See evaluation note 07/31/17) but would like to wait for further treatment of pelvic problems and address LBP and Rt LE pain now. She will beneift from PT to address problems identified.     Clinical Presentation  Stable    Clinical Presentation due to:  recurrent LBP/Rt hip pain x 10 yrs     Clinical Decision Making  Low    Rehab Potential  Good    PT Frequency  2x / week    PT Duration  6 weeks    PT Treatment/Interventions  Patient/family  education;ADLs/Self Care Home Management;Cryotherapy;Electrical Stimulation;Iontophoresis 4mg /ml Dexamethasone;Moist Heat;Ultrasound;Neuromuscular re-education;Dry needling;Manual techniques;Therapeutic activities;Therapeutic exercise    PT Next Visit Plan  review HEP; further assessment of pelvic asymmetry; DN/manual work; moist heat; HEP  - - No Modaliites due to history of cancer     PT Home Exercise Plan  -    Consulted and Agree with Plan of Care  Patient       Patient will benefit from skilled therapeutic intervention in order to improve the following deficits and impairments:  Increased fascial restricitons, Pain, Increased muscle spasms, Decreased activity tolerance, Impaired flexibility, Postural dysfunction, Improper body mechanics, Hypomobility, Decreased range of motion, Decreased mobility  Visit Diagnosis: Acute bilateral low back pain with right-sided sciatica - Plan: PT plan of care cert/re-cert  Other symptoms and signs involving the musculoskeletal system - Plan: PT plan of care cert/re-cert  Abnormal posture - Plan: PT plan of care cert/re-cert     Problem List Patient Active Problem List   Diagnosis Date Noted  . Lumbago 08/16/2017  . Basal cell carcinoma 07/17/2017  . Vaginismus 07/17/2017  . Abnormal TSH 03/28/2017  . Diarrhea 11/05/2016  . Port catheter in place 10/23/2016  . Genetic testing 08/15/2016  . Malignant neoplasm of upper-outer quadrant of left breast in female, estrogen receptor positive ([place]) 07/10/2016  . Allergic rhinitis 06/13/2016  . History of basal cell carcinoma 06/13/2016  . Eczema 06/13/2016  . Hyperlipemia 06/13/2016  . History of colon polyps 06/13/2016  . 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 RLS (restless legs syndrome) 06/13/2016  . Hypothyroidism 07/20/2013  . Bunion 01/21/2013  . Osteopenia 01/20/2013    Celyn Nilda Simmer PT, MPH  08/26/2017, 1:20 PM  [facility] [place], [name],  [place] Phone: [phone]   Fax:  [phone]  Name: Jamie [name] MRN: [id number] Date of Birth: 02-07-1956

## 2017-08-26 NOTE — Patient Instructions (Signed)
Trunk: Prone Extension (Press-Ups) no pain     Lie on stomach on firm, flat surface. Relax bottom and legs. Raise chest in air with elbows straight. Keep hips flat on surface, sag stomach. Hold _2___ seconds. Repeat __10__ times. Do _2___ sessions per day. Try to do this if you are having pain down the leg  CAUTION: Move sowly  Trunk Extension    Standing, place back of open hands on low back. Straighten spine then arch the back and move shoulders back. Repeat __1-2__ times per session. Do __1-2__ per day   Abdominal Bracing With Pelvic Floor (Hook-Lying)    With neutral spine, tighten pelvic floor and abdominals sucking belly button to to back, tighten muscles in the low back at waist; exhale slowly. Hold 10 sec  Repeat __10_ times. Do __several_ times a day. Progress to do this in sitting, standing, walking and with functional activities  HIP: Hamstrings - Supine  Place strap around foot. Raise leg up, keeping knee straight.  Bend opposite knee to protect back if indicated. Hold 30 seconds. 3 reps per set, 2-3 sets per day  Piriformis Stretch   Lying on back, pull right knee toward opposite shoulder. Hold 30 seconds. Repeat 3 times. Do 2-3 sessions per day.   Quads / HF, Supine   Lie near edge of bed, pull both knees up toward chest. Hold one knee as you drop the other leg off the edge of the bed.  Relax hanging knee/can bend knee back if indicated. Hold 30 seconds. Repeat 3 times per session. Do 2-3 sessions per day.  Quads / HF, Prone KNEE: Quadriceps - Prone    Place strap around ankle. Bring ankle toward buttocks. Press hip into surface. Hold 30 seconds. Repeat 3 times per session. Do 2-3 sessions per day.   Trigger Point Dry Needling  . What is Trigger Point Dry Needling (DN)? o DN is a physical therapy technique used to treat muscle pain and dysfunction. Specifically, DN helps deactivate muscle trigger points (muscle knots).  o A thin filiform needle is  used to penetrate the skin and stimulate the underlying trigger point. The goal is for a local twitch response (LTR) to occur and for the trigger point to relax. No medication of any kind is injected during the procedure.   . What Does Trigger Point Dry Needling Feel Like?  o The procedure feels different for each individual patient. Some patients report that they do not actually feel the needle enter the skin and overall the process is not painful. Very mild bleeding may occur. However, many patients feel a deep cramping in the muscle in which the needle was inserted. This is the local twitch response.   [name] How Will I feel after the treatment? o Soreness is normal, and the onset of soreness may not occur for a few hours. Typically this soreness does not last longer than two days.  o Bruising is uncommon, however; ice can be used to decrease any possible bruising.  o In rare cases feeling tired or nauseous after the treatment is normal. In addition, your symptoms may get worse before they get better, this period will typically not last longer than 24 hours.   . What Can I do After My Treatment? o Increase your hydration by drinking more water for the next 24 hours. o You may place ice or heat on the areas treated that have become sore, however, do not use heat on inflamed or bruised areas. Heat often brings  more relief post needling. o You can continue your regular activities, but vigorous activity is not recommended initially after the treatment for 24 hours. o DN is best combined with other physical therapy such as strengthening, stretching, and other therapies.    Sleeping on Back  Place pillow under knees. A pillow with cervical support and a roll around waist are also helpful. Copyright  VHI. All rights reserved.  Sleeping on Side Place pillow between knees. Use cervical support under neck and a roll around waist as needed. Copyright  VHI. All rights reserved.   Sleeping on  Stomach   If this is the only desirable sleeping position, place pillow under lower legs, and under stomach or chest as needed.  Posture - Sitting   Sit upright, head facing forward. Try using a roll to support lower back. Keep shoulders relaxed, and avoid rounded back. Keep hips level with knees. Avoid crossing legs for long periods. Stand to Sit / Sit to Stand   To sit: Bend knees to lower self onto front edge of chair, then scoot back on seat. To stand: Reverse sequence by placing one foot forward, and scoot to front of seat. Use rocking motion to stand up.   Work Height and Reach  Ideal work height is no more than 2 to 4 inches below elbow level when standing, and at elbow level when sitting. Reaching should be limited to arm's length, with elbows slightly bent.  Bending  Bend at hips and knees, not back. Keep feet shoulder-width apart.    Posture - Standing   Good posture is important. Avoid slouching and forward head thrust. Maintain curve in low back and align ears over shoul- ders, hips over ankles.  Alternating Positions   Alternate tasks and change positions frequently to reduce fatigue and muscle tension. Take rest breaks. Computer Work   Position work to Programmer, multimedia. Use proper work and seat height. Keep shoulders back and down, wrists straight, and elbows at right angles. Use chair that provides full back support. Add footrest and lumbar roll as needed.  Getting Into / Out of Car  Lower self onto seat, scoot back, then bring in one leg at a time. Reverse sequence to get out.  Dressing  Lie on back to pull socks or slacks over feet, or sit and bend leg while keeping back straight.    Housework - Sink  Place one foot on ledge of cabinet under sink when standing at sink for prolonged periods.   Pushing / Pulling  Pushing is preferable to pulling. Keep back in proper alignment, and use leg muscles to do the work.  Deep Squat   Squat and lift with  both arms held against upper trunk. Tighten stomach muscles without holding breath. Use smooth movements to avoid jerking.  Avoid Twisting   Avoid twisting or bending back. Pivot around using foot movements, and bend at knees if needed when reaching for articles.  Carrying Luggage   Distribute weight evenly on both sides. Use a cart whenever possible. Do not twist trunk. Move body as a unit.   Lifting Principles .Maintain proper posture and head alignment. .Slide object as close as possible before lifting. .Move obstacles out of the way. .Test before lifting; ask for help if too heavy. .Tighten stomach muscles without holding breath. .Use smooth movements; do not jerk. .Use legs to do the work, and pivot with feet. .Distribute the work load symmetrically and close to the center of trunk. .Push instead of pull  whenever possible.   Ask For Help   Ask for help and delegate to others when possible. Coordinate your movements when lifting together, and maintain the low back curve.  Log Roll   Lying on back, bend left knee and place left arm across chest. Roll all in one movement to the right. Reverse to roll to the left. Always move as one unit. Housework - Sweeping  Use long-handled equipment to avoid stooping.   Housework - Wiping  Position yourself as close as possible to reach work surface. Avoid straining your back.  Laundry - Unloading Wash   To unload small items at bottom of washer, lift leg opposite to arm being used to reach.  [place] close to area to be raked. Use arm movements to do the work. Keep back straight and avoid twisting.     Cart  When reaching into cart with one arm, lift opposite leg to keep back straight.   Getting Into / Out of Bed  Lower self to lie down on one side by raising legs and lowering head at the same time. Use arms to assist moving without twisting. Bend both knees to roll onto back if desired. To sit up, start  from lying on side, and use same move-ments in reverse. Housework - Vacuuming  Hold the vacuum with arm held at side. Step back and forth to move it, keeping head up. Avoid twisting.   Laundry - IT consultant so that bending and twisting can be avoided.   Laundry - Unloading Dryer  Squat down to reach into clothes dryer or use a reacher.  Gardening - Weeding / Probation officer or Kneel. Knee pads may be helpful.

## 2017-08-29 ENCOUNTER — Ambulatory Visit (INDEPENDENT_AMBULATORY_CARE_PROVIDER_SITE_OTHER): Payer: BLUE CROSS/BLUE SHIELD | Admitting: Physical Therapy

## 2017-08-29 DIAGNOSIS — M5441 Lumbago with sciatica, right side: Secondary | ICD-10-CM | POA: Diagnosis not present

## 2017-08-29 DIAGNOSIS — R293 Abnormal posture: Secondary | ICD-10-CM

## 2017-08-29 DIAGNOSIS — R29898 Other symptoms and signs involving the musculoskeletal system: Secondary | ICD-10-CM

## 2017-08-29 NOTE — Therapy (Signed)
[place], [name], [address] Phone: [phone]   Fax:  [phone]  Physical Therapy Treatment  Patient Details  Name: Jamie [name] MRN: [id number] Date of Birth: 1955-11-04 Referring Provider: Dr. Lynne Leader   Encounter Date: 08/29/2017  PT End of Session - 08/29/17 0936    Visit Number  2    Number of Visits  12    Date for PT Re-Evaluation  10/07/17    PT Start Time  0934    PT Stop Time  1031 MHP last 15 min    PT Time Calculation (min)  57 min    Activity Tolerance  Patient tolerated treatment well    Behavior During Therapy  [facility] for tasks assessed/performed       Past Medical History:  Diagnosis Date  . Allergy   . Anemia   . Arthritis    feet  . Basal cell carcinoma   . Breast cancer ([place]) 06/2016   left breast  . Colon polyps   . Eczema   . Genetic testing 08/15/2016   Ms. Fuerstenberg underwent genetic testing for hereditary cancer syndrome through [employer]'s 43-gene Common Hereditary Cancers Panel. Ms. 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 Result report is dated 08/15/2016. Please see genetic counseling documentation from 08/17/2016 for further discussion.  [name] History of radiation therapy 01/02/17-01/31/17   left breast was treated to 42.72 Gy in 16 fractions, left breast boost 10 Gy in 5 fractions  . Personal history of chemotherapy   . Personal history of radiation therapy   . PONV (postoperative nausea and vomiting)     Past Surgical History:  Procedure Laterality Date  . BONE SPUR Bilateral 2001 AND 1988  . BREAST BIOPSY    . BREAST LUMPECTOMY Left    07/2016  . BREAST LUMPECTOMY WITH RADIOACTIVE SEED AND SENTINEL LYMPH NODE BIOPSY Left 07/26/2016   Procedure: BREAST LUMPECTOMY WITH RADIOACTIVE SEED AND SENTINEL LYMPH NODE BIOPSY;  Surgeon: [name], MD;  Location: [facility];   Service: General;  Laterality: Left;  . BUNIONECTOMY Bilateral 02/2012  . COLONOSCOPY W/ POLYPECTOMY  08/2007  . DILATION AND CURETTAGE OF UTERUS  2002  . MOHS SURGERY  2010  . PORT-A-CATH REMOVAL N/A 07/18/2017   Procedure: REMOVAL PORT-A-CATH;  Surgeon: [name], MD;  Location: [facility];  Service: General;  Laterality: N/A;  . PORTACATH PLACEMENT Right 07/26/2016   Procedure: INSERTION PORT-A-CATH;  Surgeon: [name], MD;  Location: [facility];  Service: General;  Laterality: Right;    There were no vitals filed for this visit.  Subjective Assessment - 08/29/17 0936    Subjective  Pt reports she has been more aware of her posture and body mechanics over last few days.   She has been performing exercises daily.      Pertinent History  Breast cancer 2018 - now cancer free - she finished chemo 07/17/17; tight/stiff Rt hip; neck stiffness; bilat foot surgery last time 2013 - 2x Rt; 1x Lt - ridgid great toe     Patient Stated Goals  get rid of the back pain and be able to do her normal activities     Currently in Pain?  Yes    Pain Score  4  6/10 with transitions    Pain Location  Back    Pain Orientation  Lower;Right    Aggravating Factors  prolonged postures, transitional movements, heavy lifting          [facility] PT Assessment - 08/29/17 0001      Assessment   Medical Diagnosis  LBP; Rt hip pain     Referring Provider  Dr. Lynne Leader    Onset Date/Surgical Date  05/04/17    Hand Dominance  Right    Next MD Visit  09/27/17      Palpation   SI assessment   Rt ASIS lower than Lt. slight Lt sacral torsion.     Palpation comment  Rt low back tight and tender         [facility] Adult PT Treatment/Exercise - 08/29/17 0001      Self-Care   Self-Care  Other Self-Care Comments    Other Self-Care Comments   pt educated on self massage/ release work with a ball to low back and hips; pt verbalized understanding and returned demo.  educated pt on proper body mechanics with  sewing, fabric prep work, and laundry to reduce LBP.       Lumbar Exercises: Stretches   Passive Hamstring Stretch  --    Standing Extension  2 reps;5 seconds    Press Ups  -- 2 sec x 10 reps painfree range     Piriformis Stretch  Right;2 reps;30 seconds seated version, to tolerance      Lumbar Exercises: Seated   Sit to Stand  5 reps core engaged.     Other Seated Lumbar Exercises  TA engagement with lap press x 510 sec x 5 reps, anti-rotation isometric with pressure against PTA's hands x 5 reps each side.        Lumbar Exercises: Supine   Ab Set  5 reps 10 sec holds    Clam  10 reps unilateral, with ab set    Bent Knee Raise  10 reps with ab set      Moist Heat Therapy   Number Minutes Moist Heat  15 Minutes    Moist Heat Location  Lumbar Spine      Manual Therapy   Manual Therapy  Muscle Energy Technique    Manual therapy comments  pt in supine    Muscle Energy Technique  MET to correct ant rotated Rt ilium with contract relax of Rt hamstring; bridge afterwards to reset pelvis.                 PT Short Term Goals - 08/05/17 1527      PT SHORT TERM GOAL #1   Title  independent with using the vaginal dilator to expand the vaginal canal    Baseline  just learned    Time  4    Period  Weeks    Status  On-going      PT SHORT TERM GOAL #2   Title  independent with hip stretches to improve tissue mobility    Baseline  just learned    Time  4    Period  Weeks    Status  On-going        PT Long Term Goals - 08/26/17 1312      PT LONG TERM GOAL #1   Title  Improve pelvic alignment with patient to demonstrate equal height through PSIS and pelvis 10/07/17    Time  6    Period  Weeks    Status  New    Target Date  10/07/17      PT LONG TERM GOAL #2   Title  Decrease pain in LB and Rt hip by 50-75% allowing patient to perform normal functional activities with greater ease and without "catch" in LB 10/07/17    Time  6    Period  Weeks    Status  New    Target  Date  10/07/17      PT LONG TERM GOAL #3   Title  Patient to tolerated increased periods of sitting and return to stand with minimal to no pain 10/07/17    Time  6    Period  Weeks    Status  New      PT LONG TERM GOAL #4   Title  Independent in HEP 10/07/17    Time  6    Period  Weeks    Status  New    Target Date  10/07/17      PT LONG TERM GOAL #5   Title  Improve FOTO to </= 32% limitation 10/07/17     Time  6    Period  Weeks    Status  New            Plan - 08/29/17 1125    Clinical Impression Statement  Pt was able to easily activate transverse abdominals with minimal cues, as well as maintain netural spine throughout exercises.  Pt reported decreased LBP with transitions once she engaged core muscles. Pt has minor pelvis asymmetries; will further address them in future appt.  Pt was pain free by end of session.     Rehab Potential  Good    Clinical Impairments Affecting Rehab Potential  breast cancer 06/2016; taking Anastrozole    PT Frequency  2x / week    PT Duration  6 weeks    PT Treatment/Interventions  Patient/family education;ADLs/Self Care Home Management;Cryotherapy;Electrical Stimulation;Iontophoresis 1m/ml Dexamethasone;Moist Heat;Ultrasound;Neuromuscular re-education;Dry needling;Manual techniques;Therapeutic activities;Therapeutic exercise    PT Next Visit Plan  further assessment of pelvic asymmetry and leg length; DN/manual work; moist heat; HEP  - - NO Modalities due to history of cancer     Consulted and Agree with Plan of Care  Patient       Patient will benefit from skilled therapeutic intervention in order to improve the following deficits and impairments:  Increased fascial restricitons, Pain, Increased muscle spasms, Decreased activity tolerance, Impaired flexibility, Postural dysfunction, Improper body mechanics, Hypomobility, Decreased range of motion, Decreased mobility  Visit Diagnosis: Acute bilateral low back pain with right-sided  sciatica  Other symptoms and signs involving the musculoskeletal system  Abnormal posture     Problem List Patient Active Problem List   Diagnosis Date Noted  . Lumbago 08/16/2017  . Basal cell carcinoma 07/17/2017  . Vaginismus 07/17/2017  . Abnormal TSH 03/28/2017  . Diarrhea 11/05/2016  . Port catheter in place 10/23/2016  . Genetic testing 08/15/2016  . Malignant neoplasm of upper-outer quadrant of left breast in female, estrogen receptor positive ([place] 07/10/2016  . Allergic rhinitis 06/13/2016  . History of basal cell carcinoma 06/13/2016  . Eczema 06/13/2016  . Hyperlipemia 06/13/2016  . History of colon polyps 06/13/2016  . 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 RLS (restless legs syndrome) 06/13/2016  . Hypothyroidism 07/20/2013  . Bunion 01/21/2013  . Osteopenia 01/20/2013    CShelbie Hutching4/03/2018, 11:40 AM  [facility] [address]Phone: [phone]  Fax:  [phone] Name: Jamie EBANKSMRN: [id number] of Birth: 8Jun 10, 1957

## 2017-09-02 ENCOUNTER — Encounter: Payer: Self-pay | Admitting: Rehabilitative and Restorative Service Providers"

## 2017-09-02 ENCOUNTER — Ambulatory Visit (INDEPENDENT_AMBULATORY_CARE_PROVIDER_SITE_OTHER): Payer: BLUE CROSS/BLUE SHIELD | Admitting: Rehabilitative and Restorative Service Providers"

## 2017-09-02 DIAGNOSIS — M62838 Other muscle spasm: Secondary | ICD-10-CM

## 2017-09-02 DIAGNOSIS — R293 Abnormal posture: Secondary | ICD-10-CM | POA: Diagnosis not present

## 2017-09-02 DIAGNOSIS — M5441 Lumbago with sciatica, right side: Secondary | ICD-10-CM | POA: Diagnosis not present

## 2017-09-02 DIAGNOSIS — R29898 Other symptoms and signs involving the musculoskeletal system: Secondary | ICD-10-CM

## 2017-09-02 NOTE — Therapy (Signed)
[place], [name], [address] Phone: [phone]   Fax:  [phone]  Physical Therapy Treatment  Patient Details  Name: Jamie [name] MRN: [id number] Date of Birth: 02-11-1956 Referring Provider: Dr Lynne Leader    Encounter Date: 09/02/2017  PT End of Session - 09/02/17 1036    Visit Number  3    Number of Visits  12    Date for PT Re-Evaluation  10/07/17    Authorization Type  [employer]    Authorization - Visit Number  4    Authorization - Number of Visits  30    PT Start Time  2992    PT Stop Time  1114    PT Time Calculation (min)  59 min    Activity Tolerance  Patient tolerated treatment well       Past Medical History:  Diagnosis Date  . Allergy   . Anemia   . Arthritis    feet  . Basal cell carcinoma   . Breast cancer ([place]) 06/2016   left breast  . Colon polyps   . Eczema   . Genetic testing 08/15/2016   Ms. Hurston underwent genetic testing for hereditary cancer syndrome through [employer]'s 43-gene Common Hereditary Cancers Panel. Ms. 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 Result report is dated 08/15/2016. Please see genetic counseling documentation from 08/17/2016 for further discussion.  [name] History of radiation therapy 01/02/17-01/31/17   left breast was treated to 42.72 Gy in 16 fractions, left breast boost 10 Gy in 5 fractions  . Personal history of chemotherapy   . Personal history of radiation therapy   . PONV (postoperative nausea and vomiting)     Past Surgical History:  Procedure Laterality Date  . BONE SPUR Bilateral 2001 AND 1988  . BREAST BIOPSY    . BREAST LUMPECTOMY Left    07/2016  . BREAST LUMPECTOMY WITH RADIOACTIVE SEED AND SENTINEL LYMPH NODE BIOPSY Left 07/26/2016   Procedure: BREAST LUMPECTOMY WITH RADIOACTIVE SEED AND SENTINEL LYMPH NODE BIOPSY;  Surgeon: [name], MD;  Location: [facility];  Service: General;  Laterality: Left;  . BUNIONECTOMY Bilateral 02/2012  . COLONOSCOPY W/ POLYPECTOMY  08/2007  . DILATION AND CURETTAGE OF UTERUS  2002  . MOHS SURGERY  2010  . PORT-A-CATH REMOVAL N/A 07/18/2017   Procedure: REMOVAL PORT-A-CATH;  Surgeon: [name], MD;  Location: [facility];  Service: General;  Laterality: N/A;  . PORTACATH PLACEMENT Right 07/26/2016   Procedure: INSERTION PORT-A-CATH;  Surgeon: [name], MD;  Location: [facility];  Service: General;  Laterality: Right;    There were no vitals filed for this visit.  Subjective Assessment - 09/02/17 1023    Subjective  Patient thinks she may be feeling a little bit better. Ready to try the needling     Currently in Pain?  Yes    Pain Score  6     Pain Location  Back    Pain Orientation  Lower;Right    Pain Type  Acute pain;Chronic pain    Pain Onset  More than a month ago    Pain Frequency  Constant         [facility] PT Assessment - 09/02/17 0001      Assessment   Medical Diagnosis  LBP; Rt hip pain     Referring Provider  Dr Lynne Leader  Onset Date/Surgical Date  05/04/17    Hand Dominance  Right    Next MD Visit  09/27/17      Posture/Postural Control   Posture Comments  improving posture in standing and walking       Flexibility   ITB  tightness Rt     Piriformis  tight Rt       Palpation   Palpation comment  muscular tightness noted through bilat lumbar/QL; Rt posterior lateral hip                    [facility] Adult PT Treatment/Exercise - 09/02/17 0001      Lumbar Exercises: Stretches   Piriformis Stretch  Right;2 reps;30 seconds supine travell       Lumbar Exercises: Seated   Sit to Stand  5 reps core engaged.       Lumbar Exercises: Supine   Clam  10 reps unilateral, with ab set    Bent Knee Raise  10 reps with ab set    Bridge  10 reps core engaged       Moist Heat Therapy   Number Minutes Moist Heat  18 Minutes    Moist Heat Location   Lumbar Spine      Manual Therapy   Manual therapy comments  patient prone     Joint Mobilization  lumbar CPA mobs grade II/III    Soft tissue mobilization  deep tissue work through the lumbar paraspinals and QL; Rt posterior hip musculature into the TFL     Myofascial Release  lumbar to Rt posterior hip        Trigger Point Dry Needling - 09/02/17 1041    Consent Given?  Yes    Education Handout Provided  Yes    Muscles Treated Lower Body  -- prone bilat QL; Rt paraspinals and hip No estim     Gluteus Maximus Response  Palpable increased muscle length    Gluteus Minimus Response  Palpable increased muscle length    Piriformis Response  Palpable increased muscle length    Tensor Fascia Lata Response  Palpable increased muscle length           PT Education - 09/02/17 1055    Education provided  Yes    Education Details  HEP DN     Person(s) Educated  Patient    Methods  Explanation;Demonstration;Tactile cues;Verbal cues;Handout    Comprehension  Verbalized understanding;Returned demonstration;Verbal cues required;Tactile cues required       PT Short Term Goals - 08/05/17 1527      PT SHORT TERM GOAL #1   Title  independent with using the vaginal dilator to expand the vaginal canal    Baseline  just learned    Time  4    Period  Weeks    Status  On-going      PT SHORT TERM GOAL #2   Title  independent with hip stretches to improve tissue mobility    Baseline  just learned    Time  4    Period  Weeks    Status  On-going        PT Long Term Goals - 08/26/17 1312      PT LONG TERM GOAL #1   Title  Improve pelvic alignment with patient to demonstrate equal height through PSIS and pelvis 10/07/17    Time  6    Period  Weeks    Status  New    Target Date  10/07/17      PT LONG TERM GOAL #2   Title  Decrease pain in LB and Rt hip by 50-75% allowing patient to perform normal functional activities with greater ease and without "catch" in LB 10/07/17    Time  6     Period  Weeks    Status  New    Target Date  10/07/17      PT LONG TERM GOAL #3   Title  Patient to tolerated increased periods of sitting and return to stand with minimal to no pain 10/07/17    Time  6    Period  Weeks    Status  New      PT LONG TERM GOAL #4   Title  Independent in HEP 10/07/17    Time  6    Period  Weeks    Status  New    Target Date  10/07/17      PT LONG TERM GOAL #5   Title  Improve FOTO to </= 32% limitation 10/07/17     Time  6    Period  Weeks    Status  New            Plan - 09/02/17 1037    Clinical Impression Statement  Muscular tightness noted through the lumbar paraspinals and QL as well as into the Rt posterior hip - piriformis/gluts/TFL. Good response to DN which patient tolerated fairly well. Note decreased palpable tightness following treatment.     Rehab Potential  Good    PT Frequency  2x / week    PT Duration  6 weeks    PT Treatment/Interventions  Patient/family education;ADLs/Self Care Home Management;Cryotherapy;Electrical Stimulation;Iontophoresis 4mg /ml Dexamethasone;Moist Heat;Ultrasound;Neuromuscular re-education;Dry needling;Manual techniques;Therapeutic activities;Therapeutic exercise    PT Next Visit Plan  further assessment of pelvic asymmetry and leg length; assess response to DN/manual work; moist heat; HEP  - - NO Modalities due to history of cancer     Consulted and Agree with Plan of Care  Patient       Patient will benefit from skilled therapeutic intervention in order to improve the following deficits and impairments:  Increased fascial restricitons, Pain, Increased muscle spasms, Decreased activity tolerance, Impaired flexibility, Postural dysfunction, Improper body mechanics, Hypomobility, Decreased range of motion, Decreased mobility  Visit Diagnosis: Acute bilateral low back pain with right-sided sciatica  Other symptoms and signs involving the musculoskeletal system  Abnormal posture  Other muscle  spasm     Problem List Patient Active Problem List   Diagnosis Date Noted  . Lumbago 08/16/2017  . Basal cell carcinoma 07/17/2017  . Vaginismus 07/17/2017  . Abnormal TSH 03/28/2017  . Diarrhea 11/05/2016  . Port catheter in place 10/23/2016  . Genetic testing 08/15/2016  . Malignant neoplasm of upper-outer quadrant of left breast in female, estrogen receptor positive ([place]) 07/10/2016  . Allergic rhinitis 06/13/2016  . History of basal cell carcinoma 06/13/2016  . Eczema 06/13/2016  . Hyperlipemia 06/13/2016  . History of colon polyps 06/13/2016  . 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 RLS (restless legs syndrome) 06/13/2016  . Hypothyroidism 07/20/2013  . Bunion 01/21/2013  . Osteopenia 01/20/2013    Caylor Cerino Nilda Simmer PT, MPH 09/02/2017, 11:13 AM  [facility] [place], [name], [address] Phone: [phone]   Fax:  [phone]  Name: MATTIA OSTERMAN MRN: [id number] Date of Birth: Dec 15, 1955

## 2017-09-02 NOTE — Patient Instructions (Addendum)
Bent Leg Lift (Hook-Lying)    Tighten core and slowly raise right leg _10-12_ inches from floor. Keep trunk rigid. Hold __1-2__ seconds. Repeat __10__ times per set. Do __1-3_ sets per session. Do _1___ sessions per day.    Bridging    Slowly raise buttocks from floor, keeping core  tight. Repeat __10__ times per set. Do __1-3_ sets per session. Do _1___ sessions per day.    Strengthening: Hip Abductor - Resisted   Core tight  With band looped around both legs above knees, one knee out to the side while holding opposite knee - repeat with other knee. Repeat _10___ times per set. Do _1-3___ sets per session. Do __1__ sessions per day.    Trigger Point Dry Needling  . What is Trigger Point Dry Needling (DN)? o DN is a physical therapy technique used to treat muscle pain and dysfunction. Specifically, DN helps deactivate muscle trigger points (muscle knots).  o A thin filiform needle is used to penetrate the skin and stimulate the underlying trigger point. The goal is for a local twitch response (LTR) to occur and for the trigger point to relax. No medication of any kind is injected during the procedure.   . What Does Trigger Point Dry Needling Feel Like?  o The procedure feels different for each individual patient. Some patients report that they do not actually feel the needle enter the skin and overall the process is not painful. Very mild bleeding may occur. However, many patients feel a deep cramping in the muscle in which the needle was inserted. This is the local twitch response.   [name] How Will I feel after the treatment? o Soreness is normal, and the onset of soreness may not occur for a few hours. Typically this soreness does not last longer than two days.  o Bruising is uncommon, however; ice can be used to decrease any possible bruising.  o In rare cases feeling tired or nauseous after the treatment is normal. In addition, your symptoms may get worse before they get  better, this period will typically not last longer than 24 hours.   . What Can I do After My Treatment? o Increase your hydration by drinking more water for the next 24 hours. o You may place ice or heat on the areas treated that have become sore, however, do not use heat on inflamed or bruised areas. Heat often brings more relief post needling. o You can continue your regular activities, but vigorous activity is not recommended initially after the treatment for 24 hours. o DN is best combined with other physical therapy such as strengthening, stretching, and other therapies.

## 2017-09-09 ENCOUNTER — Encounter: Payer: Self-pay | Admitting: Rehabilitative and Restorative Service Providers"

## 2017-09-09 ENCOUNTER — Ambulatory Visit (INDEPENDENT_AMBULATORY_CARE_PROVIDER_SITE_OTHER): Payer: BLUE CROSS/BLUE SHIELD | Admitting: Rehabilitative and Restorative Service Providers"

## 2017-09-09 DIAGNOSIS — M62838 Other muscle spasm: Secondary | ICD-10-CM | POA: Diagnosis not present

## 2017-09-09 DIAGNOSIS — R29898 Other symptoms and signs involving the musculoskeletal system: Secondary | ICD-10-CM

## 2017-09-09 DIAGNOSIS — R293 Abnormal posture: Secondary | ICD-10-CM | POA: Diagnosis not present

## 2017-09-09 DIAGNOSIS — M5441 Lumbago with sciatica, right side: Secondary | ICD-10-CM | POA: Diagnosis not present

## 2017-09-09 NOTE — Patient Instructions (Signed)
Cat / Cow Flow    Inhale, press spine toward ceiling like a Halloween cat. Keeping strength in arms and abdominals, exhale to soften spine through neutral and into cow pose. Open chest and arch back. Initiate movement between cat and cow at tailbone, one vertebrae at a time. Repeat __5__ times.  BACK: Child's Pose (Sciatica)    Sit in knee-chest position and reach arms forward. Separate knees for comfort. Hold position for 30 sec  Repeat _2-3__ times. Do __1-2_ times per day.

## 2017-09-09 NOTE — Therapy (Signed)
[facility] [place], [name], [address] Phone: [phone]   Fax:  [phone]  Physical Therapy Treatment  Patient Details  Name: Jamie [name] MRN: [id number] Date of Birth: 04-13-1956 Referring Provider: Dr Lynne Leader    Encounter Date: 09/09/2017  PT End of Session - 09/09/17 1020    Visit Number  4    Number of Visits  12    Date for PT Re-Evaluation  10/07/17    Authorization Type  BCBS    Authorization - Visit Number  4    Authorization - Number of Visits  30    PT Start Time  1016    PT Stop Time  1114    PT Time Calculation (min)  58 min    Activity Tolerance  Patient tolerated treatment well       Past Medical History:  Diagnosis Date  . Allergy   . Anemia   . Arthritis    feet  . Basal cell carcinoma   . Breast cancer ([place]) 06/2016   left breast  . Colon polyps   . Eczema   . Genetic testing 08/15/2016   Ms. Yerian underwent genetic testing for hereditary cancer syndrome through [employer]'s 43-gene Common Hereditary Cancers Panel. Ms. 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 Result report is dated 08/15/2016. Please see genetic counseling documentation from 08/17/2016 for further discussion.  [name] History of radiation therapy 01/02/17-01/31/17   left breast was treated to 42.72 Gy in 16 fractions, left breast boost 10 Gy in 5 fractions  . Personal history of chemotherapy   . Personal history of radiation therapy   . PONV (postoperative nausea and vomiting)     Past Surgical History:  Procedure Laterality Date  . BONE SPUR Bilateral 2001 AND 1988  . BREAST BIOPSY    . BREAST LUMPECTOMY Left    07/2016  . BREAST LUMPECTOMY WITH RADIOACTIVE SEED AND SENTINEL LYMPH NODE BIOPSY Left 07/26/2016   Procedure: BREAST LUMPECTOMY WITH RADIOACTIVE SEED AND SENTINEL LYMPH NODE BIOPSY;  Surgeon: [name], MD;  Location: [facility];  Service: General;  Laterality: Left;  . BUNIONECTOMY Bilateral 02/2012  . COLONOSCOPY W/ POLYPECTOMY  08/2007  . DILATION AND CURETTAGE OF UTERUS  2002  . MOHS SURGERY  2010  . PORT-A-CATH REMOVAL N/A 07/18/2017   Procedure: REMOVAL PORT-A-CATH;  Surgeon: [name], MD;  Location: [facility];  Service: General;  Laterality: N/A;  . PORTACATH PLACEMENT Right 07/26/2016   Procedure: INSERTION PORT-A-CATH;  Surgeon: [name], MD;  Location: [facility];  Service: General;  Laterality: Right;    There were no vitals filed for this visit.  Subjective Assessment - 09/09/17 1021    Subjective  Good response to DN and manual work with several pain free days. Patient gradually began to have increased symptoms again with her functional activities and tasks around the home requiring bent forward positions.     Currently in Pain?  Yes    Pain Score  3  5-6/10 when back"locks up"     Pain Location  Hip    Pain Orientation  Right    Pain Descriptors / Indicators  Tightness;Aching    Pain Type  Acute pain;Chronic pain    Pain Radiating Towards  Rt hip and Rt lateral side     Pain Onset  More than a month ago    Pain  Frequency  Intermittent    Aggravating Factors   prolonged sitting; sitting to stand; bending over and straightening     Pain Relieving Factors  topical heat patch; heat; meds; lying on back; sometimes stretching; sleep; DN          [facility] PT Assessment - 09/09/17 0001      Assessment   Medical Diagnosis  LBP; Rt hip pain     Referring Provider  Dr Lynne Leader     Onset Date/Surgical Date  05/04/17    Hand Dominance  Right    Next MD Visit  09/27/17      Posture/Postural Control   Posture Comments  improving posture in standing and walking       AROM   Lumbar Flexion  95%    Lumbar Extension  65%    Lumbar - Right Side Bend  85%    Lumbar - Left Side Bend  80% pulling     Lumbar - Right Rotation  40%    Lumbar - Left Rotation  45% pulling        Flexibility   ITB  tightness Rt     Piriformis  tight Rt       Palpation   Palpation comment  muscular tightness noted through bilat lumbar/QL; Rt posterior lateral hip                    [facility] Adult PT Treatment/Exercise - 09/09/17 0001      Lumbar Exercises: Stretches   Pelvic Tilt  2 reps;30 seconds thomas at edge of table     Standing Extension  2 reps;5 seconds    Press Ups  -- prone press up 2 sec x 10     Piriformis Stretch  Right;2 reps;30 seconds supine travell       Lumbar Exercises: Seated   Sit to Stand  5 reps core engaged.       Lumbar Exercises: Supine   Ab Set  -- 3 part core 10 sec x 10 reps       Lumbar Exercises: Quadruped   Madcat/Old Horse  5 reps    Other Quadruped Lumbar Exercises  child's pose 20 sec x 1; 30 sec x 1       Moist Heat Therapy   Number Minutes Moist Heat  20 Minutes    Moist Heat Location  Lumbar Spine      Manual Therapy   Manual therapy comments  patient prone     Joint Mobilization  lumbar CPA mobs grade II/III    Soft tissue mobilization  deep tissue work through the lumbar paraspinals and QL; Rt posterior hip musculature into the TFL     Myofascial Release  lumbar to Rt posterior hip        Trigger Point Dry Needling - 09/09/17 1210    Consent Given?  Yes    Muscles Treated Lower Body  -- prone for QL; lumbar paraspinals Rt w/ estim     Gluteus Maximus Response  Palpable increased muscle length    Gluteus Minimus Response  Palpable increased muscle length    Piriformis Response  Palpable increased muscle length           PT Education - 09/09/17 1037    Education provided  Yes    Education Details  HEP     Person(s) Educated  Patient    Methods  Explanation;Demonstration;Tactile cues;Verbal cues;Handout    Comprehension  Verbalized understanding;Returned demonstration;Verbal cues required;Tactile  cues required       PT Short Term Goals - 08/05/17 1527      PT SHORT TERM GOAL #1   Title   independent with using the vaginal dilator to expand the vaginal canal    Baseline  just learned    Time  4    Period  Weeks    Status  On-going      PT SHORT TERM GOAL #2   Title  independent with hip stretches to improve tissue mobility    Baseline  just learned    Time  4    Period  Weeks    Status  On-going        PT Long Term Goals - 08/26/17 1312      PT LONG TERM GOAL #1   Title  Improve pelvic alignment with patient to demonstrate equal height through PSIS and pelvis 10/07/17    Time  6    Period  Weeks    Status  New    Target Date  10/07/17      PT LONG TERM GOAL #2   Title  Decrease pain in LB and Rt hip by 50-75% allowing patient to perform normal functional activities with greater ease and without "catch" in LB 10/07/17    Time  6    Period  Weeks    Status  New    Target Date  10/07/17      PT LONG TERM GOAL #3   Title  Patient to tolerated increased periods of sitting and return to stand with minimal to no pain 10/07/17    Time  6    Period  Weeks    Status  New      PT LONG TERM GOAL #4   Title  Independent in HEP 10/07/17    Time  6    Period  Weeks    Status  New    Target Date  10/07/17      PT LONG TERM GOAL #5   Title  Improve FOTO to </= 32% limitation 10/07/17     Time  6    Period  Weeks    Status  New            Plan - 09/09/17 1213    Clinical Impression Statement  Good response to DN and manual work. Patient reports decrease in pain and tightness. Patient continues to have muscuar tightness through the Rt QL/lats/lumbar paraspinals; posterior hip including piriformis and gluts into the TFL. Good release with treatment. Added exercise without difficulty. Gradually progressing toward stated goals of therapy. Will benefit from continued PT to address problems identified.     Rehab Potential  Good    Clinical Impairments Affecting Rehab Potential  breast cancer 06/2016; taking Anastrozole    PT Frequency  2x / week    PT Duration  6  weeks    PT Treatment/Interventions  Patient/family education;ADLs/Self Care Home Management;Cryotherapy;Electrical Stimulation;Iontophoresis 4mg /ml Dexamethasone;Moist Heat;Ultrasound;Neuromuscular re-education;Dry needling;Manual techniques;Therapeutic activities;Therapeutic exercise    PT Next Visit Plan  further assessment of pelvic asymmetry and leg length as indicated; continue DN/manual work; moist heat; HEP  - - NO Modalities due to history of cancer     Consulted and Agree with Plan of Care  Patient       Patient will benefit from skilled therapeutic intervention in order to improve the following deficits and impairments:  Increased fascial restricitons, Pain, Increased muscle spasms, Decreased activity tolerance, Impaired flexibility, Postural dysfunction,  Improper body mechanics, Hypomobility, Decreased range of motion, Decreased mobility  Visit Diagnosis: Acute bilateral low back pain with right-sided sciatica  Other symptoms and signs involving the musculoskeletal system  Abnormal posture  Other muscle spasm     Problem List Patient Active Problem List   Diagnosis Date Noted  . Lumbago 08/16/2017  . Basal cell carcinoma 07/17/2017  . Vaginismus 07/17/2017  . Abnormal TSH 03/28/2017  . Diarrhea 11/05/2016  . Port catheter in place 10/23/2016  . Genetic testing 08/15/2016  . Malignant neoplasm of upper-outer quadrant of left breast in female, estrogen receptor positive ([facility]) 07/10/2016  . Allergic rhinitis 06/13/2016  . History of basal cell carcinoma 06/13/2016  . Eczema 06/13/2016  . Hyperlipemia 06/13/2016  . History of colon polyps 06/13/2016  . 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 RLS (restless legs syndrome) 06/13/2016  . Hypothyroidism 07/20/2013  . Bunion 01/21/2013  . Osteopenia 01/20/2013    Jamie [name] Nilda Simmer PT, MPH  09/09/2017, 12:25 PM  [facility] [place], [name],  [address] Phone: [phone]   Fax:  [phone]  Name: ADINA PUZZO MRN: [id number] Date of Birth: Mar 15, 1956

## 2017-09-13 ENCOUNTER — Encounter: Payer: Self-pay | Admitting: Rehabilitative and Restorative Service Providers"

## 2017-09-13 ENCOUNTER — Ambulatory Visit (INDEPENDENT_AMBULATORY_CARE_PROVIDER_SITE_OTHER): Payer: BLUE CROSS/BLUE SHIELD | Admitting: Rehabilitative and Restorative Service Providers"

## 2017-09-13 DIAGNOSIS — R29898 Other symptoms and signs involving the musculoskeletal system: Secondary | ICD-10-CM | POA: Diagnosis not present

## 2017-09-13 DIAGNOSIS — M62838 Other muscle spasm: Secondary | ICD-10-CM

## 2017-09-13 DIAGNOSIS — R293 Abnormal posture: Secondary | ICD-10-CM | POA: Diagnosis not present

## 2017-09-13 DIAGNOSIS — M5441 Lumbago with sciatica, right side: Secondary | ICD-10-CM | POA: Diagnosis not present

## 2017-09-13 NOTE — Therapy (Signed)
[facility] [place], [name], [address] Phone: [phone]   Fax:  [phone]  Physical Therapy Treatment  Patient Details  Name: Jamie [name] MRN: [id number] Date of Birth: [birth year] Referring Provider: Dr Lynne Leader    Encounter Date: 09/13/2017  PT End of Session - 09/13/17 0807    Visit Number  5    Number of Visits  12    Date for PT Re-Evaluation  10/07/17    Authorization Type  BCBS    Authorization - Visit Number  5    Authorization - Number of Visits  30    PT Start Time  0804    PT Stop Time  0900    PT Time Calculation (min)  56 min    Activity Tolerance  Patient tolerated treatment well       Past Medical History:  Diagnosis Date  . Allergy   . Anemia   . Arthritis    feet  . Basal cell carcinoma   . Breast cancer ([place] [facility]) 06/2016   left breast  . Colon polyps   . Eczema   . Genetic testing 08/15/2016   Ms. Mullaly underwent genetic testing for hereditary cancer syndrome through [employer]'s 43-gene Common Hereditary Cancers Panel. Ms. 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 Result report is dated 08/15/2016. Please see genetic counseling documentation from 08/17/2016 for further discussion.  [name] History of radiation therapy 01/02/17-01/31/17   left breast was treated to 42.72 Gy in 16 fractions, left breast boost 10 Gy in 5 fractions  . Personal history of chemotherapy   . Personal history of radiation therapy   . PONV (postoperative nausea and vomiting)     Past Surgical History:  Procedure Laterality Date  . BONE SPUR Bilateral 2001 AND 1988  . BREAST BIOPSY    . BREAST LUMPECTOMY Left    07/2016  . BREAST LUMPECTOMY WITH RADIOACTIVE SEED AND SENTINEL LYMPH NODE BIOPSY Left 07/26/2016   Procedure: BREAST LUMPECTOMY WITH RADIOACTIVE SEED AND SENTINEL LYMPH NODE BIOPSY;  Surgeon: [name], MD;  Location: [facility];  Service: General;  Laterality: Left;  . BUNIONECTOMY Bilateral 02/2012  . COLONOSCOPY W/ POLYPECTOMY  08/2007  . DILATION AND CURETTAGE OF UTERUS  2002  . MOHS SURGERY  2010  . PORT-A-CATH REMOVAL N/A 07/18/2017   Procedure: REMOVAL PORT-A-CATH;  Surgeon: [name], MD;  Location: [facility];  Service: General;  Laterality: N/A;  . PORTACATH PLACEMENT Right 07/26/2016   Procedure: INSERTION PORT-A-CATH;  Surgeon: [name], MD;  Location: [facility];  Service: General;  Laterality: Right;    There were no vitals filed for this visit.  Subjective Assessment - 09/13/17 0808    Subjective  Patient reports that she is feeling better. She can see a benefit from the DN and is doing her exercises on a regular basis.     Currently in Pain?  Yes    Pain Score  3     Pain Location  Back    Pain Orientation  Right;Left;Lower    Pain Descriptors / Indicators  Tightness tension     Pain Type  Acute pain;Chronic pain    Pain Onset  More than a month ago    Pain Frequency  Intermittent                       [facility]  Adult PT Treatment/Exercise - 09/13/17 0001      Lumbar Exercises: Stretches   Hip Flexor Stretch  Right;Left;2 reps;30 seconds suupine thomas     Standing Extension  2 reps;5 seconds    Press Ups  -- prone press up 2 sec x 10     Piriformis Stretch  Right;2 reps;30 seconds supine travell       Lumbar Exercises: Seated   Sit to Stand  5 reps core engaged.       Lumbar Exercises: Supine   Ab Set  -- 3 part core 10 sec x 10 reps       Lumbar Exercises: Quadruped   Madcat/Old Horse  5 reps    Other Quadruped Lumbar Exercises  hold      Moist Heat Therapy   Number Minutes Moist Heat  20 Minutes    Moist Heat Location  Lumbar Spine      Manual Therapy   Manual therapy comments  patient prone     Joint Mobilization  lumbar CPA mobs grade II/III    Soft tissue mobilization  deep tissue work through the lumbar paraspinals and QL;  Rt posterior hip musculature into the TFL     Myofascial Release  lumbar to Rt posterior hip        Trigger Point Dry Needling - 09/13/17 0835    Consent Given?  Yes    Muscles Treated Lower Body  -- bilat w/ estim + QL in sidelying bilat     Gluteus Maximus Response  Palpable increased muscle length    Gluteus Minimus Response  Palpable increased muscle length    Piriformis Response  Palpable increased muscle length           PT Education - 09/13/17 0837    Education provided  Yes    Education Details  HEP walking program     Person(s) Educated  Patient    Methods  Explanation    Comprehension  Verbalized understanding       PT Short Term Goals - 08/05/17 1527      PT SHORT TERM GOAL #1   Title  independent with using the vaginal dilator to expand the vaginal canal    Baseline  just learned    Time  4    Period  Weeks    Status  On-going      PT SHORT TERM GOAL #2   Title  independent with hip stretches to improve tissue mobility    Baseline  just learned    Time  4    Period  Weeks    Status  On-going        PT Long Term Goals - 08/26/17 1312      PT LONG TERM GOAL #1   Title  Improve pelvic alignment with patient to demonstrate equal height through PSIS and pelvis 10/07/17    Time  6    Period  Weeks    Status  New    Target Date  10/07/17      PT LONG TERM GOAL #2   Title  Decrease pain in LB and Rt hip by 50-75% allowing patient to perform normal functional activities with greater ease and without "catch" in LB 10/07/17    Time  6    Period  Weeks    Status  New    Target Date  10/07/17      PT LONG TERM GOAL #3   Title  Patient to tolerated increased periods  of sitting and return to stand with minimal to no pain 10/07/17    Time  6    Period  Weeks    Status  New      PT LONG TERM GOAL #4   Title  Independent in HEP 10/07/17    Time  6    Period  Weeks    Status  New    Target Date  10/07/17      PT LONG TERM GOAL #5   Title  Improve FOTO  to </= 32% limitation 10/07/17     Time  6    Period  Weeks    Status  New            Plan - 09/13/17 0809    Clinical Impression Statement  Responding well to DN and manual work as well sa HEP. Progressing well toward stated goals of therapy.     Rehab Potential  Good    PT Frequency  2x / week    PT Duration  6 weeks    PT Treatment/Interventions  Patient/family education;ADLs/Self Care Home Management;Cryotherapy;Electrical Stimulation;Iontophoresis 4mg /ml Dexamethasone;Moist Heat;Ultrasound;Neuromuscular re-education;Dry needling;Manual techniques;Therapeutic activities;Therapeutic exercise    PT Next Visit Plan  further assessment of pelvic asymmetry and leg length as indicated; continue DN/manual work; moist heat; HEP  - - NO Modalities due to history of cancer     Consulted and Agree with Plan of Care  Patient       Patient will benefit from skilled therapeutic intervention in order to improve the following deficits and impairments:  Increased fascial restricitons, Pain, Increased muscle spasms, Decreased activity tolerance, Impaired flexibility, Postural dysfunction, Improper body mechanics, Hypomobility, Decreased range of motion, Decreased mobility  Visit Diagnosis: Acute bilateral low back pain with right-sided sciatica  Other symptoms and signs involving the musculoskeletal system  Abnormal posture  Other muscle spasm     Problem List Patient Active Problem List   Diagnosis Date Noted  . Lumbago 08/16/2017  . Basal cell carcinoma 07/17/2017  . Vaginismus 07/17/2017  . Abnormal TSH 03/28/2017  . Diarrhea 11/05/2016  . Port catheter in place 10/23/2016  . Genetic testing 08/15/2016  . Malignant neoplasm of upper-outer quadrant of left breast in female, estrogen receptor positive ([facility]) 07/10/2016  . Allergic rhinitis 06/13/2016  . History of basal cell carcinoma 06/13/2016  . Eczema 06/13/2016  . Hyperlipemia 06/13/2016  . History of colon polyps 06/13/2016   . 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 RLS (restless legs syndrome) 06/13/2016  . Hypothyroidism 07/20/2013  . Bunion 01/21/2013  . Osteopenia 01/20/2013    Elzie Sheets Nilda Simmer PT, MPH  09/13/2017, 8:41 AM  [facility] [place], [name], [address] Phone: [phone]   Fax:  [phone]  Name: Jamie [name] MRN: [id number] Date of Birth: 07-Jan-1956

## 2017-09-16 ENCOUNTER — Encounter: Payer: Self-pay | Admitting: Rehabilitative and Restorative Service Providers"

## 2017-09-16 ENCOUNTER — Ambulatory Visit (INDEPENDENT_AMBULATORY_CARE_PROVIDER_SITE_OTHER): Payer: BLUE CROSS/BLUE SHIELD | Admitting: Rehabilitative and Restorative Service Providers"

## 2017-09-16 DIAGNOSIS — M5441 Lumbago with sciatica, right side: Secondary | ICD-10-CM | POA: Diagnosis not present

## 2017-09-16 DIAGNOSIS — M62838 Other muscle spasm: Secondary | ICD-10-CM

## 2017-09-16 DIAGNOSIS — R293 Abnormal posture: Secondary | ICD-10-CM

## 2017-09-16 DIAGNOSIS — R29898 Other symptoms and signs involving the musculoskeletal system: Secondary | ICD-10-CM

## 2017-09-16 NOTE — Patient Instructions (Signed)
Back Wall Slide    With feet __10-12__ inches from wall, lean as much of back against the wall as possible. Gently squat down __8-10_ inches, keeping back against wall. Hold __10-30__ seconds while counting out loud. Repeat __5-10__ times. Do __1-2__ sessions per day.

## 2017-09-16 NOTE — Therapy (Addendum)
[place], [name], [address] Phone: [phone]   Fax:  [phone]  Physical Therapy Treatment  Patient Details  Name: Jamie [name] MRN: [id number] Date of Birth: 05-15-1956 Referring Provider: Dr Lynne Leader    Encounter Date: 09/16/2017  PT End of Session - 09/16/17 1029    Visit Number  6    Number of Visits  12    Date for PT Re-Evaluation  10/07/17    Authorization Type  BCBS    Authorization - Visit Number  6    Authorization - Number of Visits  30    PT Start Time  1017    PT Stop Time  1118    PT Time Calculation (min)  63 min    Activity Tolerance  Patient tolerated treatment well       Past Medical History:  Diagnosis Date  . Allergy   . Anemia   . Arthritis    feet  . Basal cell carcinoma   . Breast cancer ([place]) 06/2016   left breast  . Colon polyps   . Eczema   . Genetic testing 08/15/2016   Ms. Brink underwent genetic testing for hereditary cancer syndrome through [employer]'s 43-gene Common Hereditary Cancers Panel. Ms. 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 Result report is dated 08/15/2016. Please see genetic counseling documentation from 08/17/2016 for further discussion.  [name] History of radiation therapy 01/02/17-01/31/17   left breast was treated to 42.72 Gy in 16 fractions, left breast boost 10 Gy in 5 fractions  . Personal history of chemotherapy   . Personal history of radiation therapy   . PONV (postoperative nausea and vomiting)     Past Surgical History:  Procedure Laterality Date  . BONE SPUR Bilateral 2001 AND 1988  . BREAST BIOPSY    . BREAST LUMPECTOMY Left    07/2016  . BREAST LUMPECTOMY WITH RADIOACTIVE SEED AND SENTINEL LYMPH NODE BIOPSY Left 07/26/2016   Procedure: BREAST LUMPECTOMY WITH RADIOACTIVE SEED AND SENTINEL LYMPH NODE BIOPSY;  Surgeon: [name], MD;  Location: [facility];  Service: General;  Laterality: Left;  . BUNIONECTOMY Bilateral 02/2012  . COLONOSCOPY W/ POLYPECTOMY  08/2007  . DILATION AND CURETTAGE OF UTERUS  2002  . MOHS SURGERY  2010  . PORT-A-CATH REMOVAL N/A 07/18/2017   Procedure: REMOVAL PORT-A-CATH;  Surgeon: [name], MD;  Location: [facility];  Service: General;  Laterality: N/A;  . PORTACATH PLACEMENT Right 07/26/2016   Procedure: INSERTION PORT-A-CATH;  Surgeon: [name], MD;  Location: [facility];  Service: General;  Laterality: Right;    There were no vitals filed for this visit.  Subjective Assessment - 09/16/17 1031    Subjective  Improving - less times of the "catching". Has difficulty with sitting in the community. Feels she is guarded with activities.     Currently in Pain?  Yes    Pain Score  3     Pain Location  Back    Pain Orientation  Right;Left;Lower    Pain Descriptors / Indicators  Tightness    Pain Type  Acute pain;Chronic pain                       [facility] Adult PT Treatment/Exercise - 09/16/17 0001      Lumbar Exercises: Stretches   Press Ups  -- prone press up 2  sec x 10       Lumbar Exercises: Aerobic   Tread Mill  2.0 mph x 6 min       Lumbar Exercises: Standing   Wall Slides  10 reps 5-10 sec       Lumbar Exercises: Seated   Sit to Stand  5 reps core engaged.       Lumbar Exercises: Supine   Ab Set  -- 3 part core 10 sec x 10 reps       Lumbar Exercises: Quadruped   Madcat/Old Horse  5 reps    Other Quadruped Lumbar Exercises  child's pose - moving very slowly into cobra from child's pose       Moist Heat Therapy   Number Minutes Moist Heat  20 Minutes      Manual Therapy   Manual therapy comments  patient prone     Joint Mobilization  lumbar CPA mobs grade II/III    Soft tissue mobilization  deep tissue work through the lumbar paraspinals and QL; Rt posterior hip musculature into the TFL     Myofascial Release  lumbar to Rt posterior hip         Trigger Point Dry Needling - 09/16/17 1047    Consent Given?  Yes    Muscles Treated Lower Body  -- bilat QL/hips with estim    Gluteus Maximus Response  Palpable increased muscle length    Gluteus Minimus Response  Palpable increased muscle length    Piriformis Response  Palpable increased muscle length           PT Education - 09/16/17 1049    Education provided  Yes    Education Details  HEP     Person(s) Educated  Patient    Methods  Explanation;Demonstration;Tactile cues;Verbal cues;Handout    Comprehension  Verbalized understanding;Returned demonstration;Verbal cues required;Tactile cues required       PT Short Term Goals - 08/05/17 1527      PT SHORT TERM GOAL #1   Title  independent with using the vaginal dilator to expand the vaginal canal    Baseline  just learned    Time  4    Period  Weeks    Status  On-going      PT SHORT TERM GOAL #2   Title  independent with hip stretches to improve tissue mobility    Baseline  just learned    Time  4    Period  Weeks    Status  On-going        PT Long Term Goals - 09/16/17 1030      PT LONG TERM GOAL #1   Title  Improve pelvic alignment with patient to demonstrate equal height through PSIS and pelvis 10/07/17    Time  6    Period  Weeks    Status  Achieved      PT LONG TERM GOAL #2   Title  Decrease pain in LB and Rt hip by 50-75% allowing patient to perform normal functional activities with greater ease and without "catch" in LB 10/07/17    Time  6    Period  Weeks    Status  Partially Met      PT LONG TERM GOAL #3   Title  Patient to tolerated increased periods of sitting and return to stand with minimal to no pain 10/07/17    Time  6    Period  Weeks    Status  On-going  PT LONG TERM GOAL #4   Title  Independent in HEP 10/07/17    Time  6    Period  Weeks    Status  On-going      PT LONG TERM GOAL #5   Title  Improve FOTO to </= 32% limitation 10/07/17     Time  6    Period  Weeks     Status  On-going            Plan - 09/16/17 1049    Clinical Impression Statement  Continued improvement in back pain. Worked with patient to work on sitting postures and alignment. Continued working core stabilization. Progressing well toward stated goals of therapy.     Rehab Potential  Good    PT Frequency  2x / week    PT Duration  6 weeks    PT Treatment/Interventions  Patient/family education;ADLs/Self Care Home Management;Cryotherapy;Electrical Stimulation;Iontophoresis 48m/ml Dexamethasone;Moist Heat;Ultrasound;Neuromuscular re-education;Dry needling;Manual techniques;Therapeutic activities;Therapeutic exercise    PT Next Visit Plan  further assessment of pelvic asymmetry and leg length as indicated; continue DN/manual work; moist heat; HEP  - - NO Modalities due to history of cancer     Consulted and Agree with Plan of Care  Patient       Patient will benefit from skilled therapeutic intervention in order to improve the following deficits and impairments:  Increased fascial restricitons, Pain, Increased muscle spasms, Decreased activity tolerance, Impaired flexibility, Postural dysfunction, Improper body mechanics, Hypomobility, Decreased range of motion, Decreased mobility  Visit Diagnosis: Acute bilateral low back pain with right-sided sciatica  Other symptoms and signs involving the musculoskeletal system  Abnormal posture  Other muscle spasm     Problem List Patient Active Problem List   Diagnosis Date Noted  . Lumbago 08/16/2017  . Basal cell carcinoma 07/17/2017  . Vaginismus 07/17/2017  . Abnormal TSH 03/28/2017  . Diarrhea 11/05/2016  . Port catheter in place 10/23/2016  . Genetic testing 08/15/2016  . Malignant neoplasm of upper-outer quadrant of left breast in female, estrogen receptor positive ([facility] 07/10/2016  . Allergic rhinitis 06/13/2016  . History of basal cell carcinoma 06/13/2016  . Eczema 06/13/2016  . Hyperlipemia 06/13/2016  . History of  colon polyps 06/13/2016  . 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 RLS (restless legs syndrome) 06/13/2016  . Hypothyroidism 07/20/2013  . Bunion 01/21/2013  . Osteopenia 01/20/2013    Michaeljames Milnes PNilda SimmerPT, MPH 09/16/2017, 11:32 AM  [facility] [address]Phone: [phone]  Fax:  [phone] Name: Jamie BONUSMRN: [id number] of Birth: 818-Dec-1957

## 2017-09-19 ENCOUNTER — Encounter: Payer: Self-pay | Admitting: Rehabilitative and Restorative Service Providers"

## 2017-09-19 ENCOUNTER — Ambulatory Visit (INDEPENDENT_AMBULATORY_CARE_PROVIDER_SITE_OTHER): Payer: BLUE CROSS/BLUE SHIELD | Admitting: Rehabilitative and Restorative Service Providers"

## 2017-09-19 DIAGNOSIS — R293 Abnormal posture: Secondary | ICD-10-CM | POA: Diagnosis not present

## 2017-09-19 DIAGNOSIS — M62838 Other muscle spasm: Secondary | ICD-10-CM

## 2017-09-19 DIAGNOSIS — R29898 Other symptoms and signs involving the musculoskeletal system: Secondary | ICD-10-CM

## 2017-09-19 DIAGNOSIS — M5441 Lumbago with sciatica, right side: Secondary | ICD-10-CM | POA: Diagnosis not present

## 2017-09-19 NOTE — Therapy (Signed)
[place], [name], [address] Phone: [phone]   Fax:  [phone]  Physical Therapy Treatment  Patient Details  Name: Jamie [name] MRN: [id number] Date of Birth: Oct 07, 1955 Referring Provider: Dr Lynne Leader    Encounter Date: 09/19/2017  PT End of Session - 09/19/17 1016    Visit Number  7    Number of Visits  12    Date for PT Re-Evaluation  10/07/17    Authorization Type  BCBS    Authorization - Visit Number  7    Authorization - Number of Visits  30    PT Start Time  1761    PT Stop Time  1213    PT Time Calculation (min)  59 min    Activity Tolerance  Patient tolerated treatment well       Past Medical History:  Diagnosis Date  . Allergy   . Anemia   . Arthritis    feet  . Basal cell carcinoma   . Breast cancer ([place]) 06/2016   left breast  . Colon polyps   . Eczema   . Genetic testing 08/15/2016   Ms. Guggenheim underwent genetic testing for hereditary cancer syndrome through [employer]'s 43-gene Common Hereditary Cancers Panel. Ms. 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 Result report is dated 08/15/2016. Please see genetic counseling documentation from 08/17/2016 for further discussion.  [name] History of radiation therapy 01/02/17-01/31/17   left breast was treated to 42.72 Gy in 16 fractions, left breast boost 10 Gy in 5 fractions  . Personal history of chemotherapy   . Personal history of radiation therapy   . PONV (postoperative nausea and vomiting)     Past Surgical History:  Procedure Laterality Date  . BONE SPUR Bilateral 2001 AND 1988  . BREAST BIOPSY    . BREAST LUMPECTOMY Left    07/2016  . BREAST LUMPECTOMY WITH RADIOACTIVE SEED AND SENTINEL LYMPH NODE BIOPSY Left 07/26/2016   Procedure: BREAST LUMPECTOMY WITH RADIOACTIVE SEED AND SENTINEL LYMPH NODE BIOPSY;  Surgeon: [name], MD;  Location: [facility];  Service: General;  Laterality: Left;  . BUNIONECTOMY Bilateral 02/2012  . COLONOSCOPY W/ POLYPECTOMY  08/2007  . DILATION AND CURETTAGE OF UTERUS  2002  . MOHS SURGERY  2010  . PORT-A-CATH REMOVAL N/A 07/18/2017   Procedure: REMOVAL PORT-A-CATH;  Surgeon: [name], MD;  Location: [facility];  Service: General;  Laterality: N/A;  . PORTACATH PLACEMENT Right 07/26/2016   Procedure: INSERTION PORT-A-CATH;  Surgeon: [name], MD;  Location: [facility];  Service: General;  Laterality: Right;    There were no vitals filed for this visit.  Subjective Assessment - 09/19/17 1017    Subjective  Continued improvement - moving with more freedom, less tightness. Feeling much better overall. Thinks the DN is helping a lot - she feels much better through the hips and LB.     Currently in Pain?  Yes    Pain Score  2     Pain Location  Back    Pain Orientation  Right;Left;Lower    Pain Descriptors / Indicators  Tightness    Pain Type  Acute pain    Pain Radiating Towards  Rt hip and lateral side     Pain Onset  More than a month ago    Pain Frequency  Intermittent  [facility] Adult PT Treatment/Exercise - 09/19/17 0001      Therapeutic Activites    Therapeutic Activities  -- myofacial ball release work Rt posterior hip       Lumbar Exercises: Stretches   Piriformis Stretch  Right;30 seconds;4 reps varying angles     Other Lumbar Stretch Exercise  3 way doorway stretch 30 sec x 2 each position       Lumbar Exercises: Aerobic   Tread Mill  2.0 to 2.3 mph x 6 min       Lumbar Exercises: Standing   Scapular Retraction  Strengthening;Right;Left;10 reps;Theraband    Theraband Level (Scapular Retraction)  Level 2 (Red)    Row  Strengthening;Right;Left;10 reps;Theraband    Theraband Level (Row)  Level 3 (Green)    Shoulder Extension  Strengthening;Right;Left;10 reps;Theraband    Theraband Level (Shoulder Extension)  Level 3  (Green)      Manual Therapy   Manual therapy comments  patient prone     Joint Mobilization  lumbar CPA mobs grade II/III    Soft tissue mobilization  deep tissue work through the Rt lumbar paraspinals and QL; focus on Rt posterior hip musculature - piriformis and hip abductors     Myofascial Release  Rt posterior hip              PT Education - 09/19/17 1035    Education provided  Yes  (Pended)     Education Details  HEP   (Pended)     Person(s) Educated  Patient  (Pended)     Methods  Explanation;Demonstration;Tactile cues;Verbal cues;Handout  (Pended)     Comprehension  Verbalized understanding;Returned demonstration;Verbal cues required;Tactile cues required  (Pended)        PT Short Term Goals - 08/05/17 1527      PT SHORT TERM GOAL #1   Title  independent with using the vaginal dilator to expand the vaginal canal    Baseline  just learned    Time  4    Period  Weeks    Status  On-going      PT SHORT TERM GOAL #2   Title  independent with hip stretches to improve tissue mobility    Baseline  just learned    Time  4    Period  Weeks    Status  On-going        PT Long Term Goals - 09/16/17 1030      PT LONG TERM GOAL #1   Title  Improve pelvic alignment with patient to demonstrate equal height through PSIS and pelvis 10/07/17    Time  6    Period  Weeks    Status  Achieved      PT LONG TERM GOAL #2   Title  Decrease pain in LB and Rt hip by 50-75% allowing patient to perform normal functional activities with greater ease and without "catch" in LB 10/07/17    Time  6    Period  Weeks    Status  Partially Met      PT LONG TERM GOAL #3   Title  Patient to tolerated increased periods of sitting and return to stand with minimal to no pain 10/07/17    Time  6    Period  Weeks    Status  On-going      PT LONG TERM GOAL #4   Title  Independent in HEP 10/07/17    Time  6    Period  Weeks  Status  On-going      PT LONG TERM GOAL #5   Title  Improve FOTO  to </= 32% limitation 10/07/17     Time  6    Period  Weeks    Status  On-going            Plan - 09/19/17 1026    Clinical Impression Statement  Progressing well with continued improvement in LBP with greater ease of movement and less pain. Patient added upper body exercises to work lumbar core without difficulty. Progressing well toward stated goals of therapy.     Rehab Potential  Good    PT Frequency  2x / week    PT Duration  6 weeks    PT Treatment/Interventions  Patient/family education;ADLs/Self Care Home Management;Cryotherapy;Electrical Stimulation;Iontophoresis 72m/ml Dexamethasone;Moist Heat;Ultrasound;Neuromuscular re-education;Dry needling;Manual techniques;Therapeutic activities;Therapeutic exercise    PT Next Visit Plan  further assessment of pelvic asymmetry and leg length as indicated; continue DN/manual work; moist heat; HEP  - - NO Modalities due to history of cancer     Consulted and Agree with Plan of Care  Patient       Patient will benefit from skilled therapeutic intervention in order to improve the following deficits and impairments:  Increased fascial restricitons, Pain, Increased muscle spasms, Decreased activity tolerance, Impaired flexibility, Postural dysfunction, Improper body mechanics, Hypomobility, Decreased range of motion, Decreased mobility  Visit Diagnosis: Acute bilateral low back pain with right-sided sciatica  Other symptoms and signs involving the musculoskeletal system  Abnormal posture  Other muscle spasm     Problem List Patient Active Problem List   Diagnosis Date Noted  . Lumbago 08/16/2017  . Basal cell carcinoma 07/17/2017  . Vaginismus 07/17/2017  . Abnormal TSH 03/28/2017  . Diarrhea 11/05/2016  . Port catheter in place 10/23/2016  . Genetic testing 08/15/2016  . Malignant neoplasm of upper-outer quadrant of left breast in female, estrogen receptor positive ([place] 07/10/2016  . Allergic rhinitis 06/13/2016  . History  of basal cell carcinoma 06/13/2016  . Eczema 06/13/2016  . Hyperlipemia 06/13/2016  . History of colon polyps 06/13/2016  . 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 RLS (restless legs syndrome) 06/13/2016  . Hypothyroidism 07/20/2013  . Bunion 01/21/2013  . Osteopenia 01/20/2013    Future [place]ell PNilda SimmerPT, MPH  09/19/2017, 11:04 AM  [facility] [place] [address]Phone: [phone]  Fax:  [phone] Name: Jamie HEYMANNMRN: [id number] of Birth: [birth year]

## 2017-09-19 NOTE — Patient Instructions (Signed)
Scapula Adduction With Pectoralis Stretch: Low - Standing   Shoulders at 45 hands even with shoulders, keeping weight through legs, shift weight forward until you feel pull or stretch through the front of your chest. Hold _30__ seconds. Do _3__ times, _2-4__ times per day.   Scapula Adduction With Pectoralis Stretch: Mid-Range - Standing   Shoulders at 90 elbows even with shoulders, keeping weight through legs, shift weight forward until you feel pull or strength through the front of your chest. Hold __30_ seconds. Do _3__ times, __2-4_ times per day.   Scapula Adduction With Pectoralis Stretch: High - Standing   Shoulders at 120 hands up high on the doorway, keeping weight on feet, shift weight forward until you feel pull or stretch through the front of your chest. Hold _30__ seconds. Do _3__ times, _2-3__ times per day.   Resisted External Rotation: in Neutral - Bilateral   PALMS UP Sit or stand, tubing in both hands, elbows at sides, bent to 90, forearms forward. Pinch shoulder blades together and rotate forearms out. Keep elbows at sides. Repeat __10__ times per set. Do _2-3___ sets per session. Do _2-3___ sessions per day.   Low Row: Standing   Face anchor, feet shoulder width apart. Palms up, pull arms back, squeezing shoulder blades together. Repeat 10__ times per set. Do 2-3__ sets per session. Do 1__ sessions per day. Anchor Height: Waist   Strengthening: Resisted Extension   Hold tubing in right hand, arm forward. Pull arm back, elbow straight. Repeat _10___ times per set. Do 2-3____ sets per session. Do1__ sessions per day.

## 2017-09-23 ENCOUNTER — Ambulatory Visit (INDEPENDENT_AMBULATORY_CARE_PROVIDER_SITE_OTHER): Payer: BLUE CROSS/BLUE SHIELD | Admitting: Rehabilitative and Restorative Service Providers"

## 2017-09-23 DIAGNOSIS — R29898 Other symptoms and signs involving the musculoskeletal system: Secondary | ICD-10-CM

## 2017-09-23 DIAGNOSIS — R293 Abnormal posture: Secondary | ICD-10-CM

## 2017-09-23 DIAGNOSIS — M5441 Lumbago with sciatica, right side: Secondary | ICD-10-CM | POA: Diagnosis not present

## 2017-09-23 DIAGNOSIS — M62838 Other muscle spasm: Secondary | ICD-10-CM

## 2017-09-23 NOTE — Therapy (Signed)
[facility] [place], [name], [address] Phone: [phone]   Fax:  [phone]  Physical Therapy Treatment  Patient Details  Name: Jamie [name] MRN: [id number] Date of Birth: Nov 11, 1955 Referring Provider: Dr Lynne Leader    Encounter Date: 09/23/2017  PT End of Session - 09/23/17 1243    Visit Number  8    Number of Visits  12    Date for PT Re-Evaluation  10/07/17    Authorization Type  BCBS    Authorization - Visit Number  7    Authorization - Number of Visits  30    PT Start Time  8937    PT Stop Time  1116    PT Time Calculation (min)  58 min    Activity Tolerance  Patient tolerated treatment well       Past Medical History:  Diagnosis Date  . Allergy   . Anemia   . Arthritis    feet  . Basal cell carcinoma   . Breast cancer ([place]) 06/2016   left breast  . Colon polyps   . Eczema   . Genetic testing 08/15/2016   Ms. Hanline underwent genetic testing for hereditary cancer syndrome through [employer]'s 43-gene Common Hereditary Cancers Panel. Ms. 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 Result report is dated 08/15/2016. Please see genetic counseling documentation from 08/17/2016 for further discussion.  [name] History of radiation therapy 01/02/17-01/31/17   left breast was treated to 42.72 Gy in 16 fractions, left breast boost 10 Gy in 5 fractions  . Personal history of chemotherapy   . Personal history of radiation therapy   . PONV (postoperative nausea and vomiting)     Past Surgical History:  Procedure Laterality Date  . BONE SPUR Bilateral 2001 AND 1988  . BREAST BIOPSY    . BREAST LUMPECTOMY Left    07/2016  . BREAST LUMPECTOMY WITH RADIOACTIVE SEED AND SENTINEL LYMPH NODE BIOPSY Left 07/26/2016   Procedure: BREAST LUMPECTOMY WITH RADIOACTIVE SEED AND SENTINEL LYMPH NODE BIOPSY;  Surgeon: [name], MD;  Location: [facility];  Service: General;  Laterality: Left;  . BUNIONECTOMY Bilateral 02/2012  . COLONOSCOPY W/ POLYPECTOMY  08/2007  . DILATION AND CURETTAGE OF UTERUS  2002  . MOHS SURGERY  2010  . PORT-A-CATH REMOVAL N/A 07/18/2017   Procedure: REMOVAL PORT-A-CATH;  Surgeon: [name], MD;  Location: [facility];  Service: General;  Laterality: N/A;  . PORTACATH PLACEMENT Right 07/26/2016   Procedure: INSERTION PORT-A-CATH;  Surgeon: [name], MD;  Location: [facility];  Service: General;  Laterality: Right;    There were no vitals filed for this visit.  Subjective Assessment - 09/23/17 1048    Subjective  Some continued tightness in the across the LB. The right hip is a lot better - still a little tension in the Rt hip.     Currently in Pain?  Yes    Pain Score  2     Pain Location  Back    Pain Orientation  Right;Left;Lower    Pain Descriptors / Indicators  Tightness    Pain Type  Acute pain         [facility] PT Assessment - 09/23/17 0001      Assessment   Medical Diagnosis  LBP; Rt hip pain     Referring Provider  Dr Lynne Leader     Onset  Date/Surgical Date  05/04/17    Hand Dominance  Right    Next MD Visit  09/27/17      AROM   Lumbar Flexion  95%    Lumbar Extension  65%    Lumbar - Right Side Bend  85%    Lumbar - Left Side Bend  85% pulling     Lumbar - Right Rotation  50%    Lumbar - Left Rotation  50% pulling       Flexibility   ITB  tightness Rt     Piriformis  tight Rt       Palpation   Palpation comment  muscular tightness noted through bilat lumbar/QL; Rt posterior lateral hip                    [facility] Adult PT Treatment/Exercise - 09/23/17 0001      Lumbar Exercises: Stretches   Piriformis Stretch  Right;30 seconds;4 reps varying angles       Lumbar Exercises: Aerobic   Nustep  L5 x 5 min       Lumbar Exercises: Standing   Other Standing Lumbar Exercises  diagonal TB green TB in golf swing motion x 10-15 reps        Lumbar Exercises: Seated   Other Seated Lumbar Exercises  large therapy ball - ant/post pelvic tilt; alt shd flexion; marching x 10 reps core engaged       Lumbar Exercises: Supine   Ab Set  -- 3 part core 10 sec x 10       Moist Heat Therapy   Number Minutes Moist Heat  20 Minutes      Manual Therapy   Manual therapy comments  patient prone     Joint Mobilization  lumbar CPA mobs grade II/III    Soft tissue mobilization  deep tissue work through the Rt lumbar paraspinals and QL; focus on Rt posterior hip musculature - piriformis and hip abductors     Myofascial Release  Rt posterior hip        Trigger Point Dry Needling - 09/23/17 1100    Consent Given?  Yes    Muscles Treated Lower Body  -- bilat lumbar; Rt piriformis; Rt QL w/ stim     Gluteus Maximus Response  Palpable increased muscle length    Gluteus Minimus Response  Palpable increased muscle length    Piriformis Response  Palpable increased muscle length           PT Education - 09/23/17 1046    Education provided  Yes    Education Details  HEP     Person(s) Educated  Patient    Methods  Explanation;Demonstration;Tactile cues;Verbal cues;Handout    Comprehension  Verbalized understanding;Returned demonstration;Verbal cues required;Tactile cues required       PT Short Term Goals - 08/05/17 1527      PT SHORT TERM GOAL #1   Title  independent with using the vaginal dilator to expand the vaginal canal    Baseline  just learned    Time  4    Period  Weeks    Status  On-going      PT SHORT TERM GOAL #2   Title  independent with hip stretches to improve tissue mobility    Baseline  just learned    Time  4    Period  Weeks    Status  On-going        PT Long Term Goals - 09/23/17 1258  PT LONG TERM GOAL #1   Title  Improve pelvic alignment with patient to demonstrate equal height through PSIS and pelvis 10/07/17    Time  6    Period  Weeks    Status  Achieved      PT LONG TERM GOAL #2   Title   Decrease pain in LB and Rt hip by 50-75% allowing patient to perform normal functional activities with greater ease and without "catch" in LB 10/07/17    Time  6    Period  Weeks    Status  Partially Met      PT LONG TERM GOAL #3   Title  Patient to tolerated increased periods of sitting and return to stand with minimal to no pain 10/07/17    Time  6    Period  Weeks    Status  On-going      PT LONG TERM GOAL #4   Title  Independent in HEP 10/07/17    Time  6    Period  Weeks    Status  On-going      PT LONG TERM GOAL #5   Title  Improve FOTO to </= 32% limitation 10/07/17     Time  6    Period  Weeks    Status  On-going            Plan - 09/23/17 1244    Clinical Impression Statement  Continued progress with decreased frequency, intensity, duration of pain in the LB. Patient reports increased tolerance of exercise and functional activity level. She is interested in playing golf again - has not played in over a year due to medical problems then LBP. Added diagonal TB exercise without difficulty. Worked on segmental mobility through lumbar spine in sitting with therapeutic ball. Tolerated all new activities well. Cotninues to have palpable tightness which responds well to DN. Continues to progress.     Rehab Potential  Good    PT Frequency  2x / week    PT Duration  6 weeks    PT Treatment/Interventions  Patient/family education;ADLs/Self Care Home Management;Cryotherapy;Electrical Stimulation;Iontophoresis 60m/ml Dexamethasone;Moist Heat;Ultrasound;Neuromuscular re-education;Dry needling;Manual techniques;Therapeutic activities;Therapeutic exercise    PT Next Visit Plan  further assessment of pelvic asymmetry and leg length as indicated; continue DN/manual work; moist heat; HEP  - - NO Modalities due to history of cancer     Consulted and Agree with Plan of Care  Patient       Patient will benefit from skilled therapeutic intervention in order to improve the following deficits  and impairments:  Increased fascial restricitons, Pain, Increased muscle spasms, Decreased activity tolerance, Impaired flexibility, Postural dysfunction, Improper body mechanics, Hypomobility, Decreased range of motion, Decreased mobility  Visit Diagnosis: Acute bilateral low back pain with right-sided sciatica  Other symptoms and signs involving the musculoskeletal system  Abnormal posture  Other muscle spasm     Problem List Patient Active Problem List   Diagnosis Date Noted  . Lumbago 08/16/2017  . Basal cell carcinoma 07/17/2017  . Vaginismus 07/17/2017  . Abnormal TSH 03/28/2017  . Diarrhea 11/05/2016  . Port catheter in place 10/23/2016  . Genetic testing 08/15/2016  . Malignant neoplasm of upper-outer quadrant of left breast in female, estrogen receptor positive [place] 07/10/2016  . Allergic rhinitis 06/13/2016  . History of basal cell carcinoma 06/13/2016  . Eczema 06/13/2016  . Hyperlipemia 06/13/2016  . History of colon polyps 06/13/2016  . 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 RLS (restless legs syndrome)  06/13/2016  . Hypothyroidism 07/20/2013  . Bunion 01/21/2013  . Osteopenia 01/20/2013    Khaleah Duer Nilda Simmer PT, MPH  09/23/2017, 1:00 PM  [facility] [place], [name], [address] Phone: [phone]   Fax:  [phone]  Name: Jamie [name] MRN: [id number] Date of Birth: 1955/07/05

## 2017-09-23 NOTE — Patient Instructions (Addendum)
Seated Opposite-Leg / Arm    Seated Arm Swing    Sit on ball. Swing arms forward and backward. Do _1-2__ sets of __10_ repetitions.   Seated Alternating Leg Raise (Marching)    Sit on ball. Raise bent knee and return. Repeat with other leg. Do __1-2_ sets of _10__ repetitions.   Unsupported Pelvic Tilt    Gently rock pelvis forward and backward. Do _1__ sets of _10__ repetitions.  theraband golf swig

## 2017-09-26 IMAGING — CR DG CHEST 1V PORT
1 series · 1 of 1 positions shown · non-contrast
Comparison: 06/13/2016 .

CLINICAL DATA: Port-A-Cath placement

EXAM:
PORTABLE CHEST 1 VIEW

[AP]
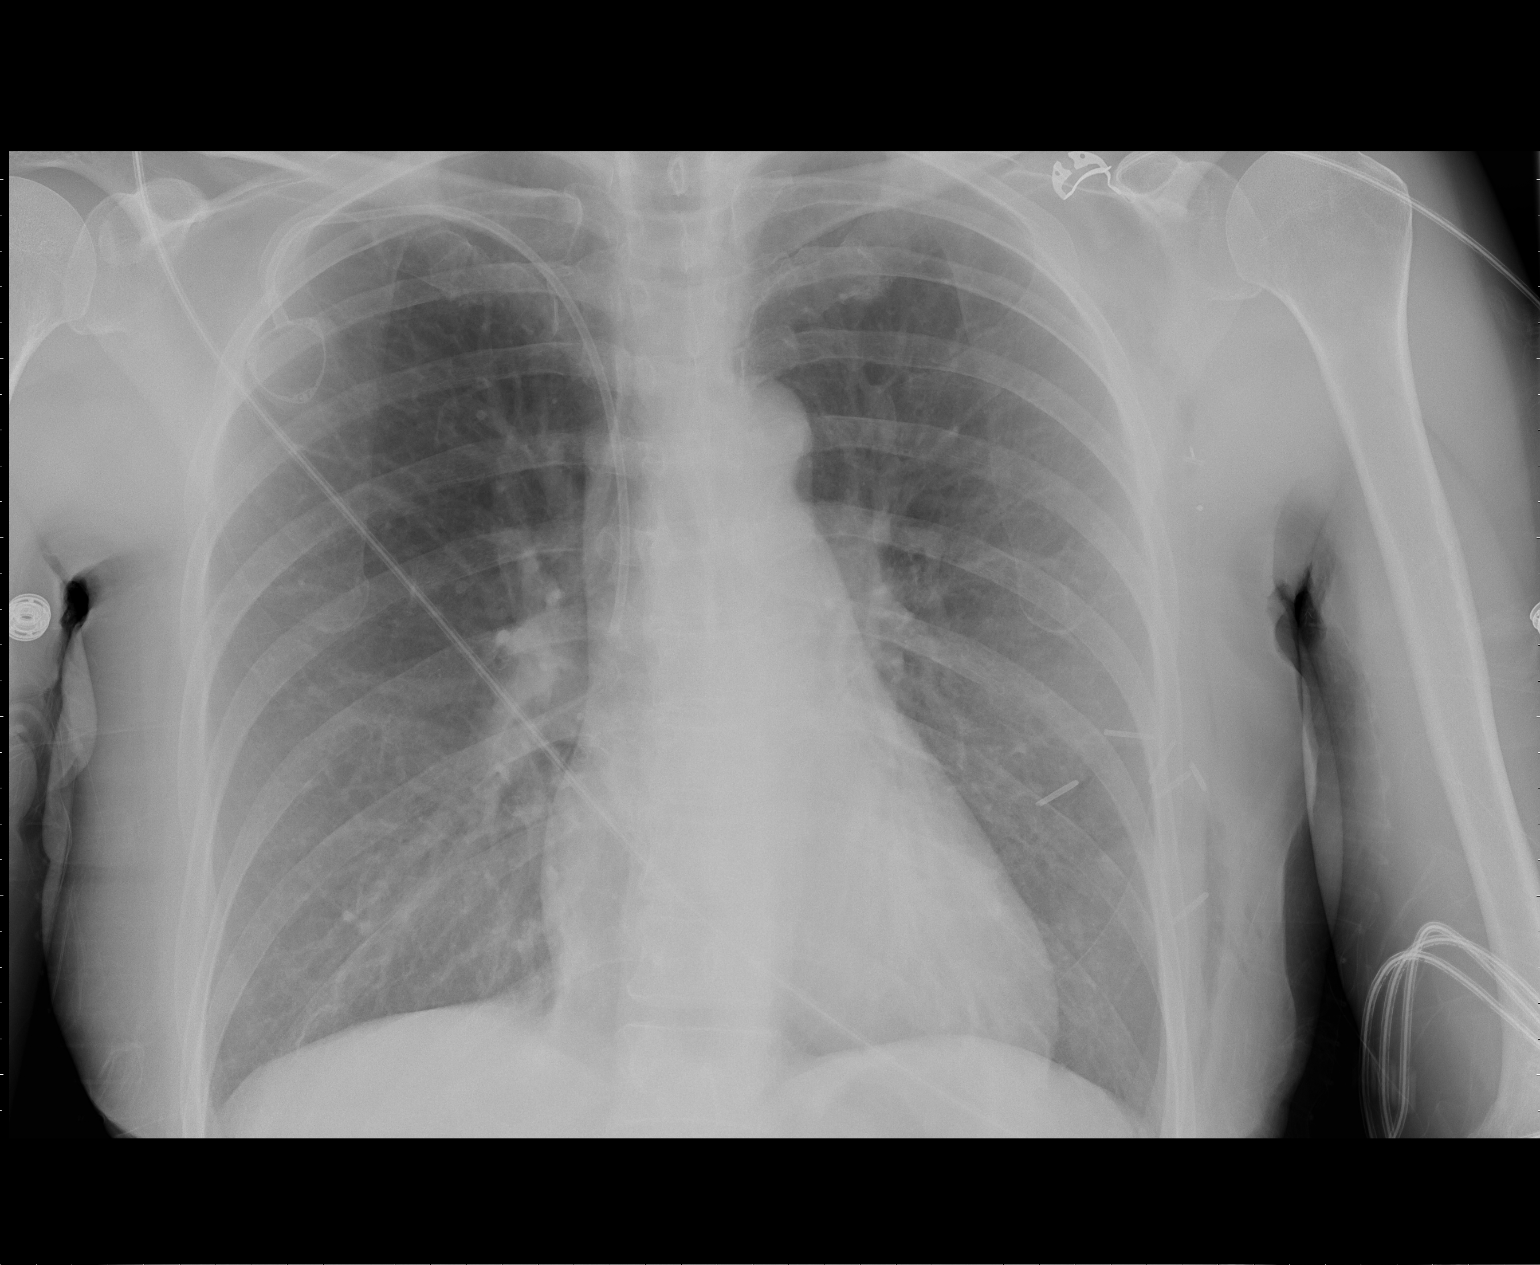

[1 of 1 positions shown; findings below may reference images not displayed]

FINDINGS: A right subclavian vein Port-A-Cath has been placed. Tip is at the
cavoatrial junction. There is no pneumothorax. Upper normal heart
size. Clear lungs. Postop changes in the left breast.
IMPRESSION: Right subclavian vein Port-A-Cath placement with its tip at the
cavoatrial junction and no pneumothorax.

## 2017-09-27 ENCOUNTER — Ambulatory Visit: Payer: BLUE CROSS/BLUE SHIELD | Admitting: Family Medicine

## 2017-09-30 ENCOUNTER — Ambulatory Visit (INDEPENDENT_AMBULATORY_CARE_PROVIDER_SITE_OTHER): Payer: BLUE CROSS/BLUE SHIELD | Admitting: Rehabilitative and Restorative Service Providers"

## 2017-09-30 ENCOUNTER — Encounter: Payer: Self-pay | Admitting: Rehabilitative and Restorative Service Providers"

## 2017-09-30 DIAGNOSIS — R29898 Other symptoms and signs involving the musculoskeletal system: Secondary | ICD-10-CM | POA: Diagnosis not present

## 2017-09-30 DIAGNOSIS — R293 Abnormal posture: Secondary | ICD-10-CM

## 2017-09-30 DIAGNOSIS — M62838 Other muscle spasm: Secondary | ICD-10-CM

## 2017-09-30 DIAGNOSIS — M5441 Lumbago with sciatica, right side: Secondary | ICD-10-CM

## 2017-09-30 NOTE — Therapy (Addendum)
[place], [name], [address] Phone: [phone]   Fax:  [phone]  Physical Therapy Treatment  Patient Details  Name: Jamie [name] MRN: [id number] Date of Birth: 07/05/1955 Referring Provider: Dr Lynne Leader    Encounter Date: 09/30/2017  PT End of Session - 09/30/17 1153    Visit Number  9    Number of Visits  12    Date for PT Re-Evaluation  10/07/17    Authorization Type  BCBS    Authorization - Visit Number  9    Authorization - Number of Visits  30    PT Start Time  3818    PT Stop Time  2993    PT Time Calculation (min)  59 min    Activity Tolerance  Patient tolerated treatment well       Past Medical History:  Diagnosis Date  . Allergy   . Anemia   . Arthritis    feet  . Basal cell carcinoma   . Breast cancer ([facility]) 06/2016   left breast  . Colon polyps   . Eczema   . Genetic testing 08/15/2016   Ms. Mankowski underwent genetic testing for hereditary cancer syndrome through [employer]'s 43-gene Common Hereditary Cancers Panel. Ms. 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 Result report is dated 08/15/2016. Please see genetic counseling documentation from 08/17/2016 for further discussion.  [name] History of radiation therapy 01/02/17-01/31/17   left breast was treated to 42.72 Gy in 16 fractions, left breast boost 10 Gy in 5 fractions  . Personal history of chemotherapy   . Personal history of radiation therapy   . PONV (postoperative nausea and vomiting)     Past Surgical History:  Procedure Laterality Date  . BONE SPUR Bilateral 2001 AND 1988  . BREAST BIOPSY    . BREAST LUMPECTOMY Left    07/2016  . BREAST LUMPECTOMY WITH RADIOACTIVE SEED AND SENTINEL LYMPH NODE BIOPSY Left 07/26/2016   Procedure: BREAST LUMPECTOMY WITH RADIOACTIVE SEED AND SENTINEL LYMPH NODE BIOPSY;  Surgeon: [name], MD;  Location: [facility];  Service: General;  Laterality: Left;  . BUNIONECTOMY Bilateral 02/2012  . COLONOSCOPY W/ POLYPECTOMY  08/2007  . DILATION AND CURETTAGE OF UTERUS  2002  . MOHS SURGERY  2010  . PORT-A-CATH REMOVAL N/A 07/18/2017   Procedure: REMOVAL PORT-A-CATH;  Surgeon: [name], MD;  Location: [facility];  Service: General;  Laterality: N/A;  . PORTACATH PLACEMENT Right 07/26/2016   Procedure: INSERTION PORT-A-CATH;  Surgeon: [name], MD;  Location: [name];  Service: General;  Laterality: Right;    There were no vitals filed for this visit.  Subjective Assessment - 09/30/17 1149    Subjective  Played 9 holes of golf last week; drove several hours and walked for several hours Friday - much more waking than she was used to. Has had some increased tightness in the Lt hip flexor and increased discomfort in the LB.     Currently in Pain?  Yes    Pain Score  5     Pain Location  Back    Pain Orientation  Right;Left;Lower    Pain Descriptors / Indicators  Tightness    Pain Onset  More than a month ago    Pain Frequency  Intermittent         [facility] PT Assessment - 09/30/17 0001  Assessment   Medical Diagnosis  LBP; Rt hip pain     Referring Provider  Dr Lynne Leader     Onset Date/Surgical Date  05/04/17    Hand Dominance  Right    Next MD Visit  09/27/17      Observation/Other Assessments   Focus on Therapeutic Outcomes (FOTO)   39% limitation       Posture/Postural Control   Posture Comments  improving posture in standing and walking       Flexibility   ITB  tightness Rt     Piriformis  tight Rt       Palpation   Palpation comment  muscular tightness noted through Rt posterior lateral hip                    [facility] Adult PT Treatment/Exercise - 09/30/17 0001      Lumbar Exercises: Stretches   Piriformis Stretch  Right;30 seconds;4 reps varying angles       Lumbar Exercises: Aerobic   Tread Mill  2.0 to 2.3 mph x 5 min        Lumbar Exercises: Standing   Other Standing Lumbar Exercises  diagonal TB green TB in golf swing motion x 10-15 reps       Lumbar Exercises: Supine   Ab Set  -- 3 part core 10 sec x 10       Moist Heat Therapy   Number Minutes Moist Heat  20 Minutes      Manual Therapy   Manual Therapy  -- working through anterior hip flexors bilat pt supine     Manual therapy comments  patient prone     Joint Mobilization  lumbar CPA mobs grade II/III    Soft tissue mobilization  deep tissue work through the Rt lumbar paraspinals and QL; focus on Rt posterior hip musculature - piriformis and hip abductors     Myofascial Release  Rt posterior hip        Trigger Point Dry Needling - 09/30/17 1245    Consent Given?  Yes    Muscles Treated Lower Body  -- Rt with estim     Gluteus Maximus Response  Palpable increased muscle length    Gluteus Minimus Response  Palpable increased muscle length    Piriformis Response  Palpable increased muscle length           PT Education - 09/30/17 1156    Education provided  Yes    Education Details  HEP     Person(s) Educated  Patient    Methods  Explanation;Demonstration;Tactile cues;Verbal cues;Handout    Comprehension  Verbalized understanding;Returned demonstration;Verbal cues required;Tactile cues required       PT Short Term Goals - 08/05/17 1527      PT SHORT TERM GOAL #1   Title  independent with using the vaginal dilator to expand the vaginal canal    Baseline  just learned    Time  4    Period  Weeks    Status  On-going      PT SHORT TERM GOAL #2   Title  independent with hip stretches to improve tissue mobility    Baseline  just learned    Time  4    Period  Weeks    Status  On-going        PT Long Term Goals - 09/30/17 1157      PT LONG TERM GOAL #1   Title  Improve pelvic alignment  with patient to demonstrate equal height through PSIS and pelvis 10/07/17    Time  6    Period  Weeks    Status  Achieved      PT LONG TERM GOAL  #2   Title  Decrease pain in LB and Rt hip by 50-75% allowing patient to perform normal functional activities with greater ease and without "catch" in LB 10/07/17    Time  6    Period  Weeks    Status  Partially Met      PT LONG TERM GOAL #3   Title  Patient to tolerated increased periods of sitting and return to stand with minimal to no pain 10/07/17    Time  6    Period  Weeks    Status  On-going      PT LONG TERM GOAL #4   Title  Independent in HEP 10/07/17    Time  6    Period  Weeks    Status  On-going      PT LONG TERM GOAL #5   Title  Improve FOTO to </= 32% limitation 10/07/17     Time  6    Period  Weeks    Status  On-going            Plan - 09/30/17 1246    Clinical Impression Statement  Progressing well with decreased pain and discomfort with increased functional activity. She is tolerating increased time/distance with ambulation and increased functional activity level at home. She has less palpable tightness through the Rt posterior hip/piriformis/gluts but does have some increased palpable tightness anteriorly through hip flexors and adductors bilat. Good response to exercise; manual work and moist heat. Patient will be out of town for the next two weeks. She will schedule a follow up appointment when she returns.     Rehab Potential  Good    Clinical Impairments Affecting Rehab Potential  breast cancer 06/2016; taking Anastrozole    PT Frequency  2x / week    PT Duration  6 weeks    PT Treatment/Interventions  Patient/family education;ADLs/Self Care Home Management;Cryotherapy;Electrical Stimulation;Iontophoresis 3m/ml Dexamethasone;Moist Heat;Ultrasound;Neuromuscular re-education;Dry needling;Manual techniques;Therapeutic activities;Therapeutic exercise    PT Next Visit Plan  further assessment of pelvic asymmetry and leg length as indicated; continue DN/manual work; moist heat; HEP  - - NO Modalities due to history of cancer     Consulted and Agree with Plan of Care   Patient       Patient will benefit from skilled therapeutic intervention in order to improve the following deficits and impairments:  Increased fascial restricitons, Pain, Increased muscle spasms, Decreased activity tolerance, Impaired flexibility, Postural dysfunction, Improper body mechanics, Hypomobility, Decreased range of motion, Decreased mobility  Visit Diagnosis: Acute bilateral low back pain with right-sided sciatica  Other symptoms and signs involving the musculoskeletal system  Abnormal posture  Other muscle spasm     Problem List Patient Active Problem List   Diagnosis Date Noted  . Lumbago 08/16/2017  . Basal cell carcinoma 07/17/2017  . Vaginismus 07/17/2017  . Abnormal TSH 03/28/2017  . Diarrhea 11/05/2016  . Port catheter in place 10/23/2016  . Genetic testing 08/15/2016  . Malignant neoplasm of upper-outer quadrant of left breast in female, estrogen receptor positive [place] 07/10/2016  . Allergic rhinitis 06/13/2016  . History of basal cell carcinoma 06/13/2016  . Eczema 06/13/2016  . Hyperlipemia 06/13/2016  . History of colon polyps 06/13/2016  . 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 RLS (restless  legs syndrome) 06/13/2016  . Hypothyroidism 07/20/2013  . Bunion 01/21/2013  . Osteopenia 01/20/2013    Daleon Willinger Nilda Simmer PT, MPH  09/30/2017, 12:54 PM  [facility] [place], [name], [address] Phone: [phone]   Fax:  [phone]  Name: Jamie [name] MRN: [id number] Date of Birth: May 12, 1956  PHYSICAL THERAPY DISCHARGE SUMMARY  Visits from Start of Care: 9  Current functional level related to goals / functional outcomes: See last progress note for discharge status. Excellent progress with rehab.    Remaining deficits: Should continue with her HEP including stretching and strengthening. Encouraged patient to return to [facility] for further evaluation and treatment of  pelvic dysfunction.    Education / Equipment: HEP  Plan: Patient agrees to discharge.  Patient goals were partially met. Patient is being discharged due to being pleased with the current functional level.  ?????     Callyn Severtson P. Helene Kelp PT, MPH 10/16/17 12:02 PM

## 2017-10-16 ENCOUNTER — Encounter: Payer: BLUE CROSS/BLUE SHIELD | Admitting: Rehabilitative and Restorative Service Providers"

## 2017-10-23 ENCOUNTER — Other Ambulatory Visit: Payer: Self-pay | Admitting: *Deleted

## 2017-10-23 DIAGNOSIS — Z170 Estrogen receptor positive status [ER+]: Principal | ICD-10-CM

## 2017-10-23 DIAGNOSIS — C50412 Malignant neoplasm of upper-outer quadrant of left female breast: Secondary | ICD-10-CM

## 2017-10-23 NOTE — Progress Notes (Signed)
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 This showed a possible mass in the left breast. On 07/02/2016 she underwent left diagnostic mammography with tomography and left breast ultrasonography. The breast density was category C. In the left breast upper outer quadrant there was a microlobulated mass which was not directly palpable, although there was slight thickening in the left breast 1:00 position. Targeted ultrasonography confirmed a solid hypoechoic microlobulated mass in the left breast 1:00 radiant 4 cm from the nipple, measuring 0.8 cm.  On 07/03/2016 [name] underwent biopsy of the left breast mass in question, and this showed ([id number]) invasive ductal carcinoma, grade 2 or 3, with extracellular mucin, estrogen receptor 5% positive with weak staining intensity, progesterone receptor negative, with an MIB-1 of 50%, and HER-2 amplified, the signals ratio being 5.71 and the number per cell 14.55.  Her subsequent history is as detailed below  INTERVAL HISTORY: Jamie [name] returns today for follow-up of her estrogen receptor positive and HER-2 amplified breast cancer. She continues on  anastrozole, with good tolerance. She denies issues with hot flashes or vaginal dryness.   She notes that she is worried about taking anastrozole because she has osteopenia (bone density 02/26/2017 showed a T score of -2.0).  She also has a right chest port scar that is discolored.   Since her last visit, she completed 1 year of trastuzumab on 07/17/2017. Her final echocardiogram on 08/06/2017 showed an ejection fraction in the 55-60% range.    REVIEW OF SYSTEMS: Roderick reports that she is experiencing left breast and axilla shooting pains and sorenness in the area of her surgery.  She tries to stretch and massage the area, which helps. She also had nailbed pain, but this has resolved. She denies having neuropathy in her fingertips. She notes that her fingernails are brittle and fragile, and she aids this with biotin. She goes to the gym 3 times per week and she walks on other days.  She denies unusual headaches, visual changes, nausea, vomiting, or dizziness. There has been no unusual cough, phlegm production, or pleurisy. This been no change in bowel or bladder habits. She denies unexplained fatigue or unexplained weight loss, bleeding, rash, or fever. A detailed review of systems was otherwise stable.   PAST MEDICAL HISTORY: Past Medical History:  Diagnosis Date  . Allergy   . Anemia   . Arthritis    feet  . Basal cell carcinoma   . Breast cancer ([place]) 06/2016   left breast  . Colon polyps   . Eczema   . Genetic testing 08/15/2016   [name] underwent genetic testing for hereditary cancer syndrome through [facility]'s 43-gene Common Hereditary Cancers Panel. 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 Result report is dated 08/15/2016. Please see genetic counseling documentation from  08/17/2016 for further discussion.  [name] History of radiation therapy 01/02/17-01/31/17   left breast was treated to 42.72 Gy in 16  fractions, left breast boost 10 Gy in 5 fractions  . Personal history of chemotherapy   . Personal history of radiation therapy   . PONV (postoperative nausea and vomiting)     PAST SURGICAL HISTORY: Past Surgical History:  Procedure Laterality Date  . BONE SPUR Bilateral 2001 AND 1988  . BREAST BIOPSY    . BREAST LUMPECTOMY Left    07/2016  . BREAST LUMPECTOMY WITH RADIOACTIVE SEED AND SENTINEL LYMPH NODE BIOPSY Left 07/26/2016   Procedure: BREAST LUMPECTOMY WITH RADIOACTIVE SEED AND SENTINEL LYMPH NODE BIOPSY;  Surgeon: [name], MD;  Location: [place];  Service: General;  Laterality: Left;  . BUNIONECTOMY Bilateral 02/2012  . COLONOSCOPY W/ POLYPECTOMY  08/2007  . DILATION AND CURETTAGE OF UTERUS  2002  . MOHS SURGERY  2010  . PORT-A-CATH REMOVAL N/A 07/18/2017   Procedure: REMOVAL PORT-A-CATH;  Surgeon: [name], MD;  Location: [place];  Service: General;  Laterality: N/A;  . PORTACATH PLACEMENT Right 07/26/2016   Procedure: INSERTION PORT-A-CATH;  Surgeon: [name], MD;  Location: [place];  Service: General;  Laterality: Right;    FAMILY HISTORY Family History  Problem Relation Age of Onset  . Ovarian cancer Mother 86  . Hypertension Father   . Stroke Father   . Heart disease Father   . Breast cancer Maternal Aunt 77       recurred at 23  . Heart disease Paternal Grandmother   . Osteoporosis Sister   . Liver cancer Maternal Uncle 63  The patient's father died at age 3, with some form of skin cancer, most likely melanoma. The patient's mother died at the age of 3 with ovarian cancer, which had been diagnosed a few months prior. The patient had no brothers, 1 sister. A maternal aunt was diagnosed with breast cancer at age 22, recurrent age 23.  GYNECOLOGIC HISTORY:  No LMP recorded. Patient is postmenopausal. Menarche age 62, the patient is GX P0. She stopped having periods approximately age 10. 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 Her husband [name] worked as a Dance movement psychotherapist for a [employer]. Their last name is pronounced foh-TEE-ah and they tell me it means "burning" in [place]. Their children are adopted. [name] lives in [place] and works in Press photographer, and [place] lives in [place] and is an Scientist, water quality. The patient has 3 grandchildren. She is not a Ambulance person.    ADVANCED DIRECTIVES: In place   HEALTH MAINTENANCE: Social History   Tobacco Use  . Smoking status: Never Smoker  . Smokeless tobacco: Never Used  Substance Use Topics  . Alcohol use: Yes    Alcohol/week: 0.0 oz    Comment: 1-2  . Drug use: No     Colonoscopy:2009  SMO:LMBEML 2018  Bone density: 02/26/2017 T-score of -2.0 at the left femur neck    Allergies  Allergen Reactions  . Adhesive [Tape] Rash  . Codeine Nausea Only and Other (See Comments)    Dizzy  . Sulfamethoxazole Nausea And Vomiting    Current Outpatient Medications  Medication Sig Dispense Refill  . AMBULATORY NON FORMULARY MEDICATION 1. Upper extremity compression sleeve. 2. Compression glove. Dx: Lymphedema. Size and fit as needed. 4 Units prn  . anastrozole (ARIMIDEX) 1 MG tablet  Take 1 tablet (1 mg total) by mouth daily. 30 tablet 12  . BIOTIN PO Take 1 tablet by mouth daily.    . calcium-vitamin D (OSCAL WITH D) 500-200 MG-UNIT tablet Take 1 tablet by mouth 2 (two) times daily.    . cetirizine (ZYRTEC) 10 MG tablet Take 10 mg by mouth daily as needed (for allergies--Spring/Summer).     . Cholecalciferol (VITAMIN D3) 1000 units CAPS Take 1,000 Units by mouth daily.     . Cyanocobalamin (B-12) 2500 MCG TABS Place 2,500 mcg under the tongue daily.     . diphenhydrAMINE (BENADRYL) 25 MG tablet Take 12.5 mg by mouth at bedtime as needed for sleep.    [name] HYDROcodone-acetaminophen (NORCO/VICODIN) 5-325 MG tablet Take 1 tablet by mouth every 4 (four) hours as needed for moderate  pain or severe pain. 10 tablet 0  . Lactobacillus (ACIDOPHILUS PROBIOTIC PO) Take 1 capsule by mouth daily.     [name] lidocaine-prilocaine (EMLA) cream Apply one application to port 1-2 hours prior to access. Cover with saran wrap. (Patient not taking: Reported on 08/26/2017) 30 g 3  . Multiple Vitamin (MULTIVITAMIN) tablet Take 1 tablet by mouth daily.     No current facility-administered medications for this visit.     OBJECTIVE: Middle-aged white woman in no acute distress  Vitals:   10/24/17 0951  BP: 103/66  Pulse: 73  Resp: 18  Temp: 98.6 F (37 C)  SpO2: 100%     Body mass index is 24.86 kg/m.   Filed Weights   10/24/17 0951  Weight: 149 lb 6.4 oz (67.8 kg)     ECOG FS:0 - Asymptomatic  Sclerae unicteric, EOMs intact Oropharynx clear and moist No cervical or supraclavicular adenopathy Lungs no rales or rhonchi Heart regular rate and rhythm Abd soft, nontender, positive bowel sounds MSK no focal spinal tenderness, no upper extremity lymphedema Neuro: nonfocal, well oriented, appropriate affect Breasts: The right breast is unremarkable.  The left breast is status post lumpectomy and radiation.  There is no evidence of local recurrence.  Both axillae are benign.  LAB RESULTS:  CMP     Component Value Date/Time   NA 142 10/24/2017 0930   NA 140 05/15/2017 0920   K 4.2 10/24/2017 0930   K 4.3 05/15/2017 0920   CL 104 10/24/2017 0930   CO2 29 10/24/2017 0930   CO2 25 05/15/2017 0920   GLUCOSE 64 (L) 10/24/2017 0930   GLUCOSE 94 05/15/2017 0920   BUN 12 10/24/2017 0930   BUN 11.5 05/15/2017 0920   CREATININE 0.87 10/24/2017 0930   CREATININE 0.8 05/15/2017 0920   CALCIUM 9.8 10/24/2017 0930   CALCIUM 9.3 05/15/2017 0920   PROT 7.2 10/24/2017 0930   PROT 6.5 05/15/2017 0920   ALBUMIN 4.2 10/24/2017 0930   ALBUMIN 3.8 05/15/2017 0920   AST 18 10/24/2017 0930   AST 17 05/15/2017 0920   ALT 13 10/24/2017 0930   ALT 10 05/15/2017 0920   ALKPHOS 62 10/24/2017 0930    ALKPHOS 53 05/15/2017 0920   BILITOT 0.3 10/24/2017 0930   BILITOT 0.36 05/15/2017 0920   GFRNONAA >60 10/24/2017 0930   GFRNONAA 72 03/25/2017 0816   GFRAA >60 10/24/2017 0930   GFRAA 84 03/25/2017 0816    INo results found for: SPEP, UPEP  Lab Results  Component Value Date   WBC 5.0 10/24/2017   NEUTROABS 3.0 10/24/2017   HGB 13.0 10/24/2017   HCT 39.4 10/24/2017   MCV 95.2 10/24/2017  PLT 251 10/24/2017      Chemistry      Component Value Date/Time   NA 142 10/24/2017 0930   NA 140 05/15/2017 0920   K 4.2 10/24/2017 0930   K 4.3 05/15/2017 0920   CL 104 10/24/2017 0930   CO2 29 10/24/2017 0930   CO2 25 05/15/2017 0920   BUN 12 10/24/2017 0930   BUN 11.5 05/15/2017 0920   CREATININE 0.87 10/24/2017 0930   CREATININE 0.8 05/15/2017 0920   GLU 73 08/02/2014      Component Value Date/Time   CALCIUM 9.8 10/24/2017 0930   CALCIUM 9.3 05/15/2017 0920   ALKPHOS 62 10/24/2017 0930   ALKPHOS 53 05/15/2017 0920   AST 18 10/24/2017 0930   AST 17 05/15/2017 0920   ALT 13 10/24/2017 0930   ALT 10 05/15/2017 0920   BILITOT 0.3 10/24/2017 0930   BILITOT 0.36 05/15/2017 0920       No results found for: LABCA2  No components found for: LABCA125  No results for input(s): INR in the last 168 hours.  Urinalysis    Component Value Date/Time   COLORURINE COLORLESS (A) 12/22/2016 2105   APPEARANCEUR CLEAR 12/22/2016 2105   LABSPEC 1.005 12/22/2016 2105   PHURINE 7.0 12/22/2016 2105   GLUCOSEU NEGATIVE 12/22/2016 2105   HGBUR NEGATIVE 12/22/2016 2105   BILIRUBINUR negative 08/16/2017 0855   KETONESUR NEGATIVE 12/22/2016 2105   PROTEINUR negative 08/16/2017 0855   PROTEINUR NEGATIVE 12/22/2016 2105   UROBILINOGEN 0.2 08/16/2017 0855   NITRITE negative 08/16/2017 0855   NITRITE NEGATIVE 12/22/2016 2105   LEUKOCYTESUR Negative 08/16/2017 0855     STUDIES: Bone density results discussed at length with patient  ELIGIBLE FOR AVAILABLE RESEARCH PROTOCOL:  no  ASSESSMENT: 62 y.o. Jule Ser, [place] woman status post biopsy of the left breast upper outer quadrant lesion 07/03/2016 showing a clinical T1b N0, stage 1B invasive ductal carcinoma, grade 2 or 3, estrogen receptor weakly positive at 5%, progesterone receptor negative, but HER-2 strongly amplified, with an MIB-1 of 50%.  (1) status post left lumpectomy with sentinel lymph node sampling 07/26/2016 for a pT1c pN0, stage IA invasive ductal carcinoma, grade 3, with extracellular mucin, and negative margins  (2) chemotherapy consisting of Paclitaxel weekly starting 09/18/2016, discontinued after one cycle due to peripheral neuropathy.   (a) Gemcitabine and Carboplatin started 10/02/2016, repeated days 1 and 8 of each 21 day cycle to a total of 8 doses (4 cycles)-- final dose 12/18/2016  (3) trastuzumab started 08/07/2016, completed 12 months on 07/17/2017  (a) baseline echocardiogram 07/25/2016 shows an ejection fraction of 60-65%  (b) repeat echocardiogram 10/22/2016 shows stable ejection fraction  (c) echocardiogram January 24, 2017 found an ejection fraction in the 55-60% range.  (d) echocardiogram 05/02/2017 showed an ejection fraction of 55-60%  (e) final echocardiogram on 08/06/2017 showed an ejection fraction in the 55-60% range  (4) adjuvant radiation 01/02/2017-01/31/2017:  Left breast was treated to 42.72 Gy in 16 fractions and boosted to 10 Gy in 5 fractions.  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   (a) associated with increased colorectal cancer risk, possibly breast cancer (at least  in [place])  (b) colonoscopy 09/11/2007/ Ezzie Dural  (c) colonoscopy 04/17/2017/ Armbruster       PLAN: Preeya is tolerating anastrozole well, and the plan will be to continue that a total of 5 years.  She is very concerned about the osteoporosis question and she certainly has significant osteopenia with a T score of -2.0.  Today we had a long discussion regarding observation, vitamin D and exercise, which she is already doing, and pharmacologic interventions.  We discussed the bisphosphonates and denosumab in great detail and she has a good understanding of the possible toxicities, side effects and complications including concerns regarding bone pain with injection, hypocalcemia transiently, and the rare cases of osteonecrosis of the jaw  After all this which she would like to do is give denosumab/Prolia a try.  She will have her first dose on 06 11 and she will have her next dose in early December.  She will see me with a December dose.  If all goes well we will do the Prolia for 2 years and then likely stop that medication  She wanted to know how much alcohol drinking is safe in the answer to a question is 0 alcohol.  I am delighted at how well she is doing overall.  I have encouraged her to think of herself as normal.  She will call with any problems that may develop before the next visit.  Loraine Freid, [name], MD  10/24/17 10:16 AM Medical Oncology and Hematology [facility] [address] [place], [place] [address] Tel. [phone]    Fax. [phone]  Alice Rieger, am acting as scribe for [name] MD.  I, Lurline Del MD, have reviewed the above documentation for accuracy and completeness, and I agree with the above.

## 2017-10-24 ENCOUNTER — Telehealth: Payer: Self-pay | Admitting: Oncology

## 2017-10-24 ENCOUNTER — Inpatient Hospital Stay (HOSPITAL_BASED_OUTPATIENT_CLINIC_OR_DEPARTMENT_OTHER): Payer: BLUE CROSS/BLUE SHIELD | Admitting: Oncology

## 2017-10-24 ENCOUNTER — Inpatient Hospital Stay: Payer: BLUE CROSS/BLUE SHIELD | Attending: Oncology

## 2017-10-24 VITALS — BP 103/66 | HR 73 | Temp 98.6°F | Resp 18 | Ht 65.0 in | Wt 149.4 lb

## 2017-10-24 DIAGNOSIS — Z79811 Long term (current) use of aromatase inhibitors: Secondary | ICD-10-CM | POA: Insufficient documentation

## 2017-10-24 DIAGNOSIS — M8580 Other specified disorders of bone density and structure, unspecified site: Secondary | ICD-10-CM

## 2017-10-24 DIAGNOSIS — Z170 Estrogen receptor positive status [ER+]: Secondary | ICD-10-CM

## 2017-10-24 DIAGNOSIS — C50412 Malignant neoplasm of upper-outer quadrant of left female breast: Secondary | ICD-10-CM

## 2017-10-24 LAB — CBC WITH DIFFERENTIAL (CANCER CENTER ONLY)
Basophils Absolute: 0 10*3/uL (ref 0.0–0.1)
Basophils Relative: 1 %
EOS ABS: 0.2 10*3/uL (ref 0.0–0.5)
Eosinophils Relative: 4 %
HCT: 39.4 % (ref 34.8–46.6)
Hemoglobin: 13 g/dL (ref 11.6–15.9)
LYMPHS ABS: 1.3 10*3/uL (ref 0.9–3.3)
LYMPHS PCT: 26 %
MCH: 31.4 pg (ref 25.1–34.0)
MCHC: 33 g/dL (ref 31.5–36.0)
MCV: 95.2 fL (ref 79.5–101.0)
Monocytes Absolute: 0.5 10*3/uL (ref 0.1–0.9)
Monocytes Relative: 10 %
NEUTROS PCT: 59 %
Neutro Abs: 3 10*3/uL (ref 1.5–6.5)
Platelet Count: 251 10*3/uL (ref 145–400)
RBC: 4.14 MIL/uL (ref 3.70–5.45)
RDW: 12.6 % (ref 11.2–14.5)
WBC Count: 5 10*3/uL (ref 3.9–10.3)

## 2017-10-24 LAB — CMP (CANCER CENTER ONLY)
ALT: 13 U/L (ref 0–55)
AST: 18 U/L (ref 5–34)
Albumin: 4.2 g/dL (ref 3.5–5.0)
Alkaline Phosphatase: 62 U/L (ref 40–150)
Anion gap: 9 (ref 3–11)
BUN: 12 mg/dL (ref 7–26)
CALCIUM: 9.8 mg/dL (ref 8.4–10.4)
CO2: 29 mmol/L (ref 22–29)
CREATININE: 0.87 mg/dL (ref 0.60–1.10)
Chloride: 104 mmol/L (ref 98–109)
GFR, Estimated: >60 mL/min (ref >60)
GLUCOSE: 64 mg/dL — AB (ref 70–140)
Potassium: 4.2 mmol/L (ref 3.5–5.1)
SODIUM: 142 mmol/L (ref 136–145)
Total Bilirubin: 0.3 mg/dL (ref 0.2–1.2)
Total Protein: 7.2 g/dL (ref 6.4–8.3)

## 2017-10-24 NOTE — Addendum Note (Signed)
Addended by: Juliane Poot on: 10/24/2017 11:27 AM   Modules accepted: Orders

## 2017-10-24 NOTE — Telephone Encounter (Signed)
Gave avs and calendar ° °

## 2017-10-29 ENCOUNTER — Inpatient Hospital Stay: Payer: BLUE CROSS/BLUE SHIELD

## 2017-10-29 VITALS — BP 106/66 | HR 66 | Temp 98.1°F | Resp 18

## 2017-10-29 DIAGNOSIS — M8580 Other specified disorders of bone density and structure, unspecified site: Secondary | ICD-10-CM | POA: Diagnosis not present

## 2017-10-29 DIAGNOSIS — C50412 Malignant neoplasm of upper-outer quadrant of left female breast: Secondary | ICD-10-CM | POA: Diagnosis not present

## 2017-10-29 DIAGNOSIS — Z79811 Long term (current) use of aromatase inhibitors: Secondary | ICD-10-CM | POA: Diagnosis not present

## 2017-10-29 DIAGNOSIS — Z170 Estrogen receptor positive status [ER+]: Secondary | ICD-10-CM | POA: Diagnosis not present

## 2017-10-29 DIAGNOSIS — Z95828 Presence of other vascular implants and grafts: Secondary | ICD-10-CM

## 2017-10-29 MED ORDER — DENOSUMAB 60 MG/ML ~~LOC~~ SOSY: 60.0000 mg | PREFILLED_SYRINGE | Freq: Once | SUBCUTANEOUS | 0 refills | Status: AC

## 2017-10-29 MED ORDER — DENOSUMAB 60 MG/ML ~~LOC~~ SOSY
60.0000 mg | PREFILLED_SYRINGE | Freq: Once | SUBCUTANEOUS | Status: AC
  Administered 2017-10-29: 60 mg via SUBCUTANEOUS

## 2017-11-04 ENCOUNTER — Telehealth: Payer: Self-pay | Admitting: Adult Health

## 2017-11-04 NOTE — Telephone Encounter (Signed)
Appointments scheduled Letter/Calendar mailed to patient per 6/17 sch msg

## 2018-01-09 ENCOUNTER — Telehealth: Payer: Self-pay | Admitting: Osteopathic Medicine

## 2018-01-09 NOTE — Telephone Encounter (Signed)
Pt called and wanted to know IF she should go ahead and come in and get a Flu vaccine before going out of the country in [place]? And wanted to ask about the different strands of Flu Vaccine. Please call patient and give her info on this

## 2018-01-09 NOTE — Telephone Encounter (Signed)
Called patient and informed could get her flu shot at her convience. Sent to schedule an appointment. KG LPN

## 2018-01-09 NOTE — Telephone Encounter (Signed)
OK to come get the flu shot.  If she's talking about high-dose flu shot, we reserve this for patients over 65

## 2018-01-14 ENCOUNTER — Ambulatory Visit (INDEPENDENT_AMBULATORY_CARE_PROVIDER_SITE_OTHER): Payer: BLUE CROSS/BLUE SHIELD | Admitting: Osteopathic Medicine

## 2018-01-14 VITALS — Temp 99.0°F

## 2018-01-14 DIAGNOSIS — Z23 Encounter for immunization: Secondary | ICD-10-CM | POA: Diagnosis not present

## 2018-01-14 NOTE — Progress Notes (Signed)
Pt presents to office for flu shot. Flu questionnaire provided and answered appropriately.

## 2018-02-04 ENCOUNTER — Encounter: Payer: BLUE CROSS/BLUE SHIELD | Admitting: Adult Health

## 2018-02-05 ENCOUNTER — Other Ambulatory Visit: Payer: Self-pay | Admitting: Oncology

## 2018-02-11 ENCOUNTER — Inpatient Hospital Stay: Payer: BLUE CROSS/BLUE SHIELD | Attending: Adult Health | Admitting: Adult Health

## 2018-02-11 VITALS — BP 98/61 | HR 72 | Temp 98.7°F | Resp 18 | Ht 65.0 in | Wt 151.3 lb

## 2018-02-11 DIAGNOSIS — Z9221 Personal history of antineoplastic chemotherapy: Secondary | ICD-10-CM

## 2018-02-11 DIAGNOSIS — Z79811 Long term (current) use of aromatase inhibitors: Secondary | ICD-10-CM | POA: Diagnosis not present

## 2018-02-11 DIAGNOSIS — Z170 Estrogen receptor positive status [ER+]: Secondary | ICD-10-CM

## 2018-02-11 DIAGNOSIS — Z923 Personal history of irradiation: Secondary | ICD-10-CM

## 2018-02-11 DIAGNOSIS — C50412 Malignant neoplasm of upper-outer quadrant of left female breast: Secondary | ICD-10-CM | POA: Diagnosis not present

## 2018-02-11 NOTE — Progress Notes (Signed)
CLINIC:  Survivorship   REASON FOR VISIT:  Routine follow-up post-treatment for a recent history of breast cancer.  BRIEF ONCOLOGIC HISTORY:    Malignant neoplasm of upper-outer quadrant of left breast in female, estrogen receptor positive ([place])   07/03/2016 Initial Biopsy    biopsy of the left breast upper outer quadrant lesion 07/03/2016 showing a clinical T1b N0, stage 1B invasive ductal carcinoma, grade 2 or 3, estrogen receptor weakly positive at 5%, progesterone receptor negative, but HER-2 strongly amplified, with an MIB-1 of 50%    07/10/2016 Initial Diagnosis    Malignant neoplasm of upper-outer quadrant of left breast in female, estrogen receptor positive ([place])    07/26/2016 Surgery    post left lumpectomy with sentinel lymph node sampling 07/26/2016 for a pT1c pN0, stage IA invasive ductal carcinoma, grade 3, with extracellular mucin, and negative margins, 2SLN negative    08/07/2016 - 07/17/2017 Adjuvant Chemotherapy    trastuzumab started 08/07/2016, completed 12 months on 07/17/2017             (a) baseline echocardiogram 07/25/2016 shows an ejection fraction of 60-65%             (b) repeat echocardiogram 10/22/2016 shows stable ejection fraction             (c) echocardiogram January 24, 2017 found an ejection fraction in the 55-60% range.             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              (a) associated with increased colorectal cancer risk, possibly breast cancer (at least in Sephardic Jews)             (b) colonoscopy 09/11/2007/ Ezzie Dural             (c) colonoscopy 04/17/2017/ [facility]    09/18/2016 - 12/18/2016 Adjuvant Chemotherapy   chemotherapy consisting of Paclitaxel weekly starting 09/18/2016, discontinued after one cycle due to peripheral neuropathy.              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 Leifheit presents to the [facility] today for our initial meeting to review her survivorship care plan detailing her treatment course for breast cancer, as well as monitoring long-term side effects  of that treatment, education regarding health maintenance, screening, and overall wellness and health promotion.     Overall, Ms. Zipp reports feeling quite well.  She is taking Anastrozole daily and is tolerating it well.  She is accompanied by her husband [name] today.  She has mild arthralgias.  She is experiencing both hot flashes, that are better than the previously were.  These are not waking her up at night.  She has some mild vaginal dryness and she did go to pelvic rehab.  She doesn't really want to deal with that issue at this point.      REVIEW OF SYSTEMS:  Review of Systems  Constitutional: Negative for appetite change, chills, fatigue, fever and unexpected weight change.  HENT:   Negative for hearing loss, lump/mass, mouth sores and trouble swallowing.   Eyes: Negative for eye problems and icterus.  Respiratory: Negative for chest tightness, cough  and shortness of breath.   Cardiovascular: Negative for chest pain, leg swelling and palpitations.  Gastrointestinal: Negative for abdominal distention, abdominal pain, constipation, diarrhea, nausea and vomiting.  Endocrine: Positive for hot flashes.  Genitourinary: Negative for dyspareunia.   Musculoskeletal: Positive for arthralgias.  Skin: Negative for itching and rash.  Neurological: Negative for dizziness, extremity weakness, headaches and numbness.  Hematological: Negative for adenopathy. Does not bruise/bleed easily.  Psychiatric/Behavioral: Negative for depression. The patient is not nervous/anxious.    Breast: Denies any new nodularity, masses, tenderness, nipple changes, or nipple discharge.      ONCOLOGY TREATMENT TEAM:  1. Surgeon:  Dr. Barry Dienes at [facility] 2. Medical Oncologist: Dr. [name]  3. Radiation Oncologist: Dr. Sondra Come    PAST MEDICAL/SURGICAL HISTORY:  Past Medical History:  Diagnosis Date  . Allergy   . Anemia   . Arthritis    feet  . Basal cell carcinoma   . Breast cancer ([place]) 06/2016   left breast  . Colon polyps   . Eczema   . Genetic testing 08/15/2016   Ms. Tingley underwent genetic testing for hereditary cancer syndrome through [facility]'s 43-gene Common Hereditary Cancers Panel. Ms. Gathright testing revealed a single pathogenic mutation in MUTYH and a variant of uncertain significance (VUS) in SDHB. Result report is dated 08/15/2016. Please see genetic counseling documentation from 08/17/2016 for further discussion.  [name] History of radiation therapy 01/02/17-01/31/17   left breast was treated to 42.72 Gy in 16 fractions, left breast boost 10 Gy in 5 fractions  . Personal history of chemotherapy   . Personal history of radiation therapy   . PONV (postoperative nausea and vomiting)    Past Surgical History:  Procedure Laterality Date  . BONE SPUR Bilateral 2001 AND 1988  . BREAST BIOPSY    . BREAST LUMPECTOMY Left    07/2016  . BREAST  LUMPECTOMY WITH RADIOACTIVE SEED AND SENTINEL LYMPH NODE BIOPSY Left 07/26/2016   Procedure: BREAST LUMPECTOMY WITH RADIOACTIVE SEED AND SENTINEL LYMPH NODE BIOPSY;  Surgeon: [name], MD;  Location: [facility];  Service: General;  Laterality: Left;  . BUNIONECTOMY Bilateral 02/2012  . COLONOSCOPY W/ POLYPECTOMY  08/2007  . DILATION AND CURETTAGE OF UTERUS  2002  . MOHS SURGERY  2010  . PORT-A-CATH REMOVAL N/A 07/18/2017   Procedure: REMOVAL PORT-A-CATH;  Surgeon: [name], MD;  Location: [facility];  Service: General;  Laterality: N/A;  . PORTACATH PLACEMENT Right 07/26/2016   Procedure: INSERTION PORT-A-CATH;  Surgeon: [name], MD;  Location: [facility];  Service: General;  Laterality: Right;  ALLERGIES:  Allergies  Allergen Reactions  . Adhesive [Tape] Rash  . Codeine Nausea Only and Other (See Comments)    Dizzy  . Sulfamethoxazole Nausea And Vomiting     CURRENT MEDICATIONS:  Outpatient Encounter Medications as of 02/11/2018  Medication Sig  . AMBULATORY NON FORMULARY MEDICATION 1. Upper extremity compression sleeve. 2. Compression glove. Dx: Lymphedema. Size and fit as needed.  [name] anastrozole (ARIMIDEX) 1 MG tablet TAKE 1 TABLET(1 MG) BY MOUTH DAILY  . BIOTIN PO Take 1 tablet by mouth daily.  . calcium-vitamin D (OSCAL WITH D) 500-200 MG-UNIT tablet Take 1 tablet by mouth 2 (two) times daily.  . cetirizine (ZYRTEC) 10 MG tablet Take 10 mg by mouth daily as needed (for allergies--Spring/Summer).   . Cholecalciferol (VITAMIN D3) 1000 units CAPS Take 1,000 Units by mouth daily.   . Cyanocobalamin (B-12) 2500 MCG TABS Place 2,500 mcg under the tongue daily.   [name] denosumab (PROLIA) 60 MG/ML SOSY injection Inject 60 mg into the skin every 6 (six) months.  . diphenhydrAMINE (BENADRYL) 25 MG tablet Take 12.5 mg by mouth at bedtime as needed for sleep.  . Multiple Vitamin (MULTIVITAMIN) tablet Take 1 tablet by mouth daily.   No facility-administered  encounter medications on file as of 02/11/2018.      ONCOLOGIC FAMILY HISTORY:  Family History  Problem Relation Age of Onset  . Ovarian cancer Mother 68  . Hypertension Father   . Stroke Father   . Heart disease Father   . Breast cancer Maternal Aunt 77       recurred at 74  . Heart disease Paternal Grandmother   . Osteoporosis Sister   . Liver cancer Maternal Uncle 77     GENETIC COUNSELING/TESTING: See above  SOCIAL HISTORY:  Social History   Socioeconomic History  . Marital status: Married    Spouse name: Not on file  . Number of children: 1  . Years of education: Not on file  . Highest education level: Not on file  Occupational History  . Occupation: retired  Scientific laboratory technician  . Financial resource strain: Not on file  . Food insecurity:    Worry: Not on file    Inability: Not on file  . Transportation needs:    Medical: Not on file    Non-medical: Not on file  Tobacco Use  . Smoking status: Never Smoker  . Smokeless tobacco: Never Used  Substance and Sexual Activity  . Alcohol use: Yes    Alcohol/week: 0.0 standard drinks    Comment: 1-2  . Drug use: No  . Sexual activity: Yes    Birth control/protection: Post-menopausal  Lifestyle  . Physical activity:    Days per week: Not on file    Minutes per session: Not on file  . Stress: Not on file  Relationships  . Social connections:    Talks on phone: Not on file    Gets together: Not on file    Attends religious service: Not on file    Active member of club or organization: Not on file    Attends meetings of clubs or organizations: Not on file    Relationship status: Not on file  . Intimate partner violence:    Fear of current or ex partner: Not on file    Emotionally abused: Not on file    Physically abused: Not on file    Forced sexual activity: Not on file  Other Topics Concern  . Not on file  Social History  Narrative   I biological son and 1 adopted daughter     PHYSICAL EXAMINATION:  Vital  Signs:   Vitals:   02/11/18 1407  BP: 98/61  Pulse: 72  Resp: 18  Temp: 98.7 F (37.1 C)  SpO2: 100%   Filed Weights   02/11/18 1407  Weight: 151 lb 4.8 oz (68.6 kg)   General: Well-nourished, well-appearing female in no acute distress.  She is accompanied in clinic by her husband today.   HEENT: Head is normocephalic.  Pupils equal and reactive to light. Conjunctivae clear without exudate.  Sclerae anicteric. Oral mucosa is pink, moist.  Oropharynx is pink without lesions or erythema.  Lymph: No cervical, supraclavicular, or infraclavicular lymphadenopathy noted on palpation.  Cardiovascular: Regular rate and rhythm.[name] Respiratory: Clear to auscultation bilaterally. Chest expansion symmetric; breathing non-labored.  Breast: left breast s/p lumpectomy, no sign of recurrence noted, right breast benign GI: Abdomen soft and round; non-tender, non-distended. Bowel sounds normoactive.  GU: Deferred.  Neuro: No focal deficits. Steady gait.  Psych: Mood and affect normal and appropriate for situation.  Extremities: No edema. MSK: No focal spinal tenderness to palpation.  Full range of motion in bilateral upper extremities Skin: Warm and dry.  LABORATORY DATA:  None for this visit.  DIAGNOSTIC IMAGING:  None for this visit.      ASSESSMENT AND PLAN:  Ms.. Whitenight is a pleasant 62 y.o. female with Stage IA left breast invasive ductal carcinoma, ER+/PR-/HER2+, diagnosed in 06/2016, treated with lumpectomy, adjuvant chemotherapy, maintenance trastuzumab, adjuvant radiation therapy, and anti-estrogen therapy with Anastrozole beginning in 02/2017.  She presents to the Survivorship Clinic for our initial meeting and routine follow-up post-completion of treatment for breast cancer.    1. Stage IA left breast cancer:  Ms. Batton is continuing to recover from definitive treatment for breast cancer. She will follow-up with her medical oncologist, Dr. [name] in 04/2017 with history and physical  exam per surveillance protocol.  She will continue her anti-estrogen therapy with Anastrozole. Thus far, she is tolerating the Anastrozole well, with minimal side effects.  Today, a comprehensive survivorship care plan and treatment summary was reviewed with the patient today detailing her breast cancer diagnosis, treatment course, potential late/long-term effects of treatment, appropriate follow-up care with recommendations for the future, and patient education resources.  A copy of this summary, along with a letter will be sent to the patient's primary care provider via mail/fax/In Basket message after today's visit.    2. H/o vaginal dryness: She has stopped pelvic rehab, and didn't note much benefit.  Right now she is managing this on her own.  I did review Josph Macho touch if the vaginal dryness becomes an issue again.  3. Bone health:  Given Ms. Villamizar's age/history of breast cancer and her current treatment regimen including anti-estrogen therapy with Anastrozole, she is at risk for bone demineralization.  She underwent bone density testing in 02/2017 that demonstrated osteopenia with a t score of -2.  She was subsequently started on Prolia every 6 months given in June and December.  She is tolerating that well.  She does know about calcium, vitamin d intake along with weight bearing exercises.  She was given education on specific activities to promote bone health.  4. Cancer screening:  Due to Ms. Mabey's history and her age, she should receive screening for skin cancers, colon cancer, and gynecologic cancers.  The information and recommendations are listed on the patient's comprehensive care plan/treatment summary and were reviewed in detail  with the patient.    5. Health maintenance and wellness promotion: Ms. Mannes was encouraged to consume 5-7 servings of fruits and vegetables per day. We reviewed the "Nutrition Rainbow" handout, as well as the handout "Take Control of Your Health and Reduce Your  Cancer Risk" from the [place].  She was also encouraged to engage in moderate to vigorous exercise for 30 minutes per day most days of the week. We discussed the [employer] fitness program, which is designed for cancer survivors to help them become more physically fit after cancer treatments.  She was instructed to limit her alcohol consumption and continue to abstain from tobacco use.     6. Support services/counseling: It is not uncommon for this period of the patient's cancer care trajectory to be one of many emotions and stressors.  We discussed an opportunity for her to participate in the next session of [facility] ("Finding Your New Normal") support group series designed for patients after they have completed treatment.   Ms. Grumbine was encouraged to take advantage of our many other support services programs, support groups, and/or counseling in coping with her new life as a cancer survivor after completing anti-cancer treatment.  She was offered support today through active listening and expressive supportive counseling.  She was given information regarding our available services and encouraged to contact me with any questions or for help enrolling in any of our support group/programs.    Dispo:   -Return to cancer center 04/2018 for f/u with Dr. [name] and Prolia -Mammogram due in 06/2018 -Bone density 02/2019 -Follow up with Dr. Barry Dienes, likely in June, 2020 -She is welcome to return back to the Survivorship Clinic at any time; no additional follow-up needed at this time.  -Consider referral back to survivorship as a long-term survivor for continued surveillance  A total of (30) minutes of face-to-face time was spent with this patient with greater than 50% of that time in counseling and care-coordination.   Gardenia Phlegm, [place] [phone]   Note: PRIMARY CARE PROVIDER Emeterio Reeve,  [place] [phone]

## 2018-02-12 ENCOUNTER — Encounter: Payer: Self-pay | Admitting: Adult Health

## 2018-02-17 ENCOUNTER — Encounter: Payer: Self-pay | Admitting: Physician Assistant

## 2018-02-17 ENCOUNTER — Ambulatory Visit (INDEPENDENT_AMBULATORY_CARE_PROVIDER_SITE_OTHER): Payer: BLUE CROSS/BLUE SHIELD | Admitting: Physician Assistant

## 2018-02-17 ENCOUNTER — Ambulatory Visit (INDEPENDENT_AMBULATORY_CARE_PROVIDER_SITE_OTHER): Payer: BLUE CROSS/BLUE SHIELD

## 2018-02-17 VITALS — BP 94/65 | HR 110 | Temp 99.3°F | Resp 18 | Wt 150.0 lb

## 2018-02-17 DIAGNOSIS — R000 Tachycardia, unspecified: Secondary | ICD-10-CM | POA: Diagnosis not present

## 2018-02-17 DIAGNOSIS — J00 Acute nasopharyngitis [common cold]: Secondary | ICD-10-CM | POA: Diagnosis not present

## 2018-02-17 DIAGNOSIS — J22 Unspecified acute lower respiratory infection: Secondary | ICD-10-CM

## 2018-02-17 DIAGNOSIS — R05 Cough: Secondary | ICD-10-CM | POA: Diagnosis not present

## 2018-02-17 LAB — POCT INFLUENZA A/B
INFLUENZA A, POC: NEGATIVE
Influenza B, POC: NEGATIVE

## 2018-02-17 MED ORDER — IPRATROPIUM BROMIDE 0.06 % NA SOLN: 2.0000 | Freq: Four times a day (QID) | NASAL | 0 refills | Status: DC | PRN

## 2018-02-17 MED ORDER — PREDNISONE 50 MG PO TABS: 50.0000 mg | ORAL_TABLET | Freq: Every day | ORAL | 0 refills | Status: DC

## 2018-02-17 MED ORDER — AZITHROMYCIN 250 MG PO TABS: ORAL_TABLET | ORAL | 0 refills | Status: DC

## 2018-02-17 NOTE — Progress Notes (Signed)
HPI:                                                                Jamie [name] is a 62 y.o. female who presents to [facility]: [place] today for cough  Cough  This is a new problem. The current episode started in the past 7 days. The problem has been gradually worsening. The cough is productive of purulent sputum. Associated symptoms include chills, ear congestion, headaches, myalgias, nasal congestion, postnasal drip, rhinorrhea, shortness of breath (with exertion) and sweats. Pertinent negatives include no chest pain or hemoptysis. Risk factors for lung disease include travel ([place] weeks ago). She has tried OTC cough suppressant and rest for the symptoms. Her past medical history is significant for bronchitis and pneumonia (10 years ago).   No flowsheet data found.    Past Medical History:  Diagnosis Date  . Allergy   . Anemia   . Arthritis    feet  . Basal cell carcinoma   . Breast cancer ([place]) 06/2016   left breast  . Colon polyps   . Eczema   . Genetic testing 08/15/2016   Jamie [name] underwent genetic testing for hereditary cancer syndrome through [facility]'s 43-gene Common Hereditary Cancers Panel. 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 Result report is dated 08/15/2016. Please see genetic counseling documentation from 08/17/2016 for further discussion.  [name] History of radiation therapy 01/02/17-01/31/17   left breast was treated to 42.72 Gy in 16 fractions, left breast boost 10 Gy in 5 fractions  . Personal history of chemotherapy   . Personal history of radiation therapy   . PONV (postoperative nausea and vomiting)    Past Surgical History:  Procedure Laterality Date  . BONE SPUR Bilateral 2001 AND 1988  . BREAST BIOPSY    . BREAST LUMPECTOMY Left    07/2016  . BREAST LUMPECTOMY WITH RADIOACTIVE SEED AND SENTINEL LYMPH NODE BIOPSY Left 07/26/2016   Procedure: BREAST LUMPECTOMY WITH RADIOACTIVE SEED AND SENTINEL LYMPH NODE BIOPSY;  Surgeon: [name], MD;  Location: [facility];  Service: General;  Laterality: Left;  . BUNIONECTOMY Bilateral 02/2012  . COLONOSCOPY W/ POLYPECTOMY  08/2007  . DILATION AND CURETTAGE OF UTERUS  2002  . MOHS SURGERY  2010  . PORT-A-CATH REMOVAL N/A 07/18/2017   Procedure: REMOVAL PORT-A-CATH;  Surgeon: [name], MD;  Location: [facility];  Service: General;  Laterality: N/A;  . PORTACATH PLACEMENT Right 07/26/2016   Procedure: INSERTION PORT-A-CATH;  Surgeon: [name], MD;  Location: [facility];  Service: General;  Laterality: Right;   Social History   Tobacco Use  . Smoking status: Never Smoker  . Smokeless tobacco: Never Used  Substance Use Topics  . Alcohol use: Yes    Alcohol/week: 0.0 standard drinks    Comment: 1-2   family history includes Breast cancer (age of onset: 62) in her maternal aunt; Heart disease in her father and paternal grandmother; Hypertension in her father; Liver cancer (age of onset: 9) in her maternal uncle; Osteoporosis in her sister; Ovarian cancer (age of onset: 36) in her mother; Stroke in her father.    ROS: negative except as  noted in the HPI  Medications: Current Outpatient Medications  Medication Sig Dispense Refill  . AMBULATORY NON FORMULARY MEDICATION 1. Upper extremity compression sleeve. 2. Compression glove. Dx: Lymphedema. Size and fit as needed. 4 Units prn  . anastrozole (ARIMIDEX) 1 MG tablet TAKE 1 TABLET(1 MG) BY MOUTH DAILY 30 tablet 0  . azithromycin (ZITHROMAX Z-PAK) 250 MG tablet Take 2 tablets (500 mg) on  Day 1,  followed by 1 tablet (250 mg) once daily on Days 2 through 5. 6 tablet 0  . BIOTIN PO Take 1 tablet by mouth daily.    . calcium-vitamin D (OSCAL WITH D) 500-200 MG-UNIT tablet Take 1 tablet by mouth 2 (two) times daily.    . cetirizine (ZYRTEC) 10 MG tablet Take 10 mg by mouth daily as needed (for  allergies--Spring/Summer).     . Cholecalciferol (VITAMIN D3) 1000 units CAPS Take 1,000 Units by mouth daily.     . Cyanocobalamin (B-12) 2500 MCG TABS Place 2,500 mcg under the tongue daily.     [name] denosumab (PROLIA) 60 MG/ML SOSY injection Inject 60 mg into the skin every 6 (six) months.    . diphenhydrAMINE (BENADRYL) 25 MG tablet Take 12.5 mg by mouth at bedtime as needed for sleep.    [name] ipratropium (ATROVENT) 0.06 % nasal spray Place 2 sprays into both nostrils 4 (four) times daily as needed for rhinitis. 15 mL 0  . Multiple Vitamin (MULTIVITAMIN) tablet Take 1 tablet by mouth daily.    . predniSONE (DELTASONE) 50 MG tablet Take 1 tablet (50 mg total) by mouth daily. 5 tablet 0   No current facility-administered medications for this visit.    Allergies  Allergen Reactions  . Adhesive [Tape] Rash  . Codeine Nausea Only and Other (See Comments)    Dizzy  . Sulfamethoxazole Nausea And Vomiting       Objective:  BP 94/65   Pulse (!) 110   Temp 99.3 F (37.4 C) (Oral)   Wt 150 lb (68 kg)   SpO2 97%   BMI 24.96 kg/m  Gen:  alert, not ill-appearing, no distress, appropriate for age 60: head normocephalic without obvious abnormality, conjunctiva and cornea clear, TMs pearly gray and semi-transparent,, oropharynx clear, neck supple, no cervical adenopathy, trachea midline Pulm: Normal work of breathing, normal phonation, clear to auscultation bilaterally, no wheezes, rales or rhonchi CV: Mildly tachycardic rate, regular rhythm, s1 and s2 distinct, no murmurs, clicks or rubs  Neuro: alert and oriented x 3, no tremor MSK: extremities atraumatic, normal gait and station Skin: intact, no rashes on exposed skin, no jaundice, no cyanosis    Results for orders placed or performed in visit on 02/17/18 (from the past 72 hour(s))  POCT Influenza A/B     Status: Normal   Collection Time: 02/17/18  9:44 AM  Result Value Ref Range   Influenza A, POC Negative Negative   Influenza B,  POC Negative Negative   No results found.    Assessment and Plan: 62 y.o. female with   Jamie [name] was seen today for cough.  Diagnoses and all orders for this visit:  Acute lower respiratory infection -     DG Chest 2 View -     azithromycin (ZITHROMAX Z-PAK) 250 MG tablet; Take 2 tablets (500 mg) on  Day 1,  followed by 1 tablet (250 mg) once daily on Days 2 through 5. -     predniSONE (DELTASONE) 50 MG tablet; Take 1 tablet (50 mg total) by mouth  daily.  Acute rhinitis -     ipratropium (ATROVENT) 0.06 % nasal spray; Place 2 sprays into both nostrils 4 (four) times daily as needed for rhinitis. -     POCT Influenza A/B  Tachycardia with heart rate 100-120 beats per minute -     DG Chest 2 View   Mildly tachycardic, mild relative hypoxia of 97% on RA at rest. Hx of recent travel. Will obtain CXR to r/o infiltrate Influenza A/B negative Supportive care. Prednisone burst and expectorant, intranasal anticholinergic spray, cough suppressant at bedtime as needed  Patient education and anticipatory guidance given Patient agrees with treatment plan Follow-up as needed if symptoms worsen or fail to improve  Darlyne Russian PA-C

## 2018-02-17 NOTE — Patient Instructions (Addendum)
For nasal symptoms/sinusitis: - Atrovent nasal spray, 2 sprays 4 times daily - nasal saline rinses / netti pot (do this prior to nasal spray) - warm facial compresses - oral decongestants and antihistamines like Claritin-D and Zyrtec-D may help dry up secretions (caution using decongestants if you have high blood pressure, heart disease or kidney disease) - for sinus headache: Tylenol 1000mg  every 8 hours as needed. Alternate with Ibuprofen 600mg  every 6 hours  For sore throat: - Tylenol 1000mg  every 8 hours as needed for throat pain. Alternate with Ibuprofen 600mg  every 6 hours - Cepacol throat lozenges and/or Chloraseptic spray - Warm salt water gargles  For cough: - Mucinex with at least 8 oz. of water to make cough more productive - Delsym or Robitussin at bedtime to help suppress cough  Note: follow package instructions for all over-the-counter medications. If using multi-symptom medications (Dayquil, Theraflu, etc.), check the label for duplicate drug ingredients.    Acute Bronchitis, Adult Acute bronchitis is sudden (acute) swelling of the air tubes (bronchi) in the lungs. Acute bronchitis causes these tubes to fill with mucus, which can make it hard to breathe. It can also cause coughing or wheezing. In adults, acute bronchitis usually goes away within 2 weeks. A cough caused by bronchitis may last up to 3 weeks. Smoking, allergies, and asthma can make the condition worse. Repeated episodes of bronchitis may cause further lung problems, such as chronic obstructive pulmonary disease (COPD). What are the causes? This condition can be caused by germs and by substances that irritate the lungs, including:  Cold and flu viruses. This condition is most often caused by the same virus that causes a cold.  Bacteria.  Exposure to tobacco smoke, dust, fumes, and air pollution.  What increases the risk? This condition is more likely to develop in people who:  Have close contact with  someone with acute bronchitis.  Are exposed to lung irritants, such as tobacco smoke, dust, fumes, and vapors.  Have a weak immune system.  Have a respiratory condition such as asthma.  What are the signs or symptoms? Symptoms of this condition include:  A cough.  Coughing up clear, yellow, or green mucus.  Wheezing.  Chest congestion.  Shortness of breath.  A fever.  Body aches.  Chills.  A sore throat.  How is this diagnosed? This condition is usually diagnosed with a physical exam. During the exam, your health care provider may order tests, such as chest X-rays, to rule out other conditions. He or she may also:  Test a sample of your mucus for bacterial infection.  Check the level of oxygen in your blood. This is done to check for pneumonia.  Do a chest X-ray or lung function testing to rule out pneumonia and other conditions.  Perform blood tests.  Your health care provider will also ask about your symptoms and medical history. How is this treated? Most cases of acute bronchitis clear up over time without treatment. Your health care provider may recommend:  Drinking more fluids. Drinking more makes your mucus thinner, which may make it easier to breathe.  Taking a medicine for a fever or cough.  Taking an antibiotic medicine.  Using an inhaler to help improve shortness of breath and to control a cough.  Using a cool mist vaporizer or humidifier to make it easier to breathe.  Follow these instructions at home: Medicines  Take over-the-counter and prescription medicines only as told by your health care provider.  If you were prescribed  an antibiotic, take it as told by your health care provider. Do not stop taking the antibiotic even if you start to feel better. General instructions  Get plenty of rest.  Drink enough fluids to keep your urine clear or pale yellow.  Avoid smoking and secondhand smoke. Exposure to cigarette smoke or irritating  chemicals will make bronchitis worse. If you smoke and you need help quitting, ask your health care provider. Quitting smoking will help your lungs heal faster.  Use an inhaler, cool mist vaporizer, or humidifier as told by your health care provider.  Keep all follow-up visits as told by your health care provider. This is important. How is this prevented? To lower your risk of getting this condition again:  Wash your hands often with soap and water. If soap and water are not available, use hand sanitizer.  Avoid contact with people who have cold symptoms.  Try not to touch your hands to your mouth, nose, or eyes.  Make sure to get the flu shot every year.  Contact a health care provider if:  Your symptoms do not improve in 2 weeks of treatment. Get help right away if:  You cough up blood.  You have chest pain.  You have severe shortness of breath.  You become dehydrated.  You faint or keep feeling like you are going to faint.  You keep vomiting.  You have a severe headache.  Your fever or chills gets worse. This information is not intended to replace advice given to you by your health care provider. Make sure you discuss any questions you have with your health care provider. Document Released: 06/14/2004 Document Revised: 11/30/2015 Document Reviewed: 10/26/2015 Elsevier Interactive Patient Education  [employer].

## 2018-02-20 ENCOUNTER — Ambulatory Visit (INDEPENDENT_AMBULATORY_CARE_PROVIDER_SITE_OTHER): Payer: BLUE CROSS/BLUE SHIELD | Admitting: Family Medicine

## 2018-02-20 VITALS — BP 112/60 | HR 78 | Temp 98.1°F | Ht 65.0 in | Wt 153.0 lb

## 2018-02-20 DIAGNOSIS — J189 Pneumonia, unspecified organism: Secondary | ICD-10-CM | POA: Diagnosis not present

## 2018-02-20 MED ORDER — HYDROCODONE-HOMATROPINE 5-1.5 MG/5ML PO SYRP: 5.0000 mL | ORAL_SOLUTION | Freq: Three times a day (TID) | ORAL | 0 refills | Status: DC | PRN

## 2018-02-20 MED ORDER — CEFDINIR 300 MG PO CAPS: 300.0000 mg | ORAL_CAPSULE | Freq: Two times a day (BID) | ORAL | 0 refills | Status: DC

## 2018-02-20 MED ORDER — BENZONATATE 200 MG PO CAPS: 200.0000 mg | ORAL_CAPSULE | Freq: Three times a day (TID) | ORAL | 1 refills | Status: DC | PRN

## 2018-02-20 NOTE — Patient Instructions (Addendum)
Thank you for coming in today. Continue the over the counter medication.  Use the tessalon pearles for cough as well.  Use the hycodan cough liquid for severe cough especially at night Take omnicef twice daily Finish prednisone and azithromycin.   Return sooner if needed.   We should recheck xray in 1 month.     Cough, Adult Coughing is a reflex that clears your throat and your airways. Coughing helps to heal and protect your lungs. It is normal to cough occasionally, but a cough that happens with other symptoms or lasts a long time may be a sign of a condition that needs treatment. A cough may last only 2-3 weeks (acute), or it may last longer than 8 weeks (chronic). What are the causes? Coughing is commonly caused by:  Breathing in substances that irritate your lungs.  A viral or bacterial respiratory infection.  Allergies.  Asthma.  Postnasal drip.  Smoking.  Acid backing up from the stomach into the esophagus (gastroesophageal reflux).  Certain medicines.  Chronic lung problems, including COPD (or rarely, lung cancer).  Other medical conditions such as heart failure.  Follow these instructions at home: Pay attention to any changes in your symptoms. Take these actions to help with your discomfort:  Take medicines only as told by your health care provider. ? If you were prescribed an antibiotic medicine, take it as told by your health care provider. Do not stop taking the antibiotic even if you start to feel better. ? Talk with your health care provider before you take a cough suppressant medicine.  Drink enough fluid to keep your urine clear or pale yellow.  If the air is dry, use a cold steam vaporizer or humidifier in your bedroom or your home to help loosen secretions.  Avoid anything that causes you to cough at work or at home.  If your cough is worse at night, try sleeping in a semi-upright position.  Avoid cigarette smoke. If you smoke, quit smoking. If  you need help quitting, ask your health care provider.  Avoid caffeine.  Avoid alcohol.  Rest as needed.  Contact a health care provider if:  You have new symptoms.  You cough up pus.  Your cough does not get better after 2-3 weeks, or your cough gets worse.  You cannot control your cough with suppressant medicines and you are losing sleep.  You develop pain that is getting worse or pain that is not controlled with pain medicines.  You have a fever.  You have unexplained weight loss.  You have night sweats. Get help right away if:  You cough up blood.  You have difficulty breathing.  Your heartbeat is very fast. This information is not intended to replace advice given to you by your health care provider. Make sure you discuss any questions you have with your health care provider. Document Released: 11/03/2010 Document Revised: 10/13/2015 Document Reviewed: 07/14/2014 Elsevier Interactive Patient Education  [employer].

## 2018-02-20 NOTE — Progress Notes (Signed)
Jamie [name] is a 62 y.o. female who presents to [facility] today for cough fevers chills body aches.  Patient was seen by my partner Sherlie Ban, PA-C on September 30.  At that time chest x-ray showed infiltrate concerning for community-acquired pneumonia.  She was treated with prednisone and azithromycin.  She notes slight improvement in symptoms but she is still not much better.  She notes continued fevers chills body aches and productive cough.  She is worried about worsening into the weekend.  She has a pertinent past medical history significant for breast cancer currently in remission.  She has taken over-the-counter commendation cough and cold medications which have been somewhat helpful.   ROS as above:  Exam:  BP 112/60   Pulse 78   Temp 98.1 F (36.7 C) (Oral)   Ht 5\' 5"  (1.651 m)   Wt 153 lb (69.4 kg)   SpO2 100%   BMI 25.46 kg/m  Wt Readings from Last 5 Encounters:  02/20/18 153 lb (69.4 kg)  02/17/18 150 lb (68 kg)  02/11/18 151 lb 4.8 oz (68.6 kg)  10/24/17 149 lb 6.4 oz (67.8 kg)  08/16/17 150 lb (68 kg)    Gen: Well NAD HEENT: EOMI,  MMM posterior pharynx with cobblestoning.  Clear nasal discharge with inflamed nasal turbinates bilaterally.  Mild cervical lymphadenopathy.  Normal tympanic membranes bilaterally. Lungs: Normal work of breathing.  Slight coarse  breath sounds left lung field Heart: RRR no MRG Abd: NABS, Soft. Nondistended, Nontender Exts: Brisk capillary refill, warm and well perfused.   Lab and Radiology Results No results found for this or any previous visit (from the past 72 hour(s)). Dg Chest 2 View  Result Date: 02/17/2018 CLINICAL DATA:  Cough and chest congestion with fever for the past week. History of breast malignancy, nonsmoker. EXAM: CHEST - 2 VIEW COMPARISON:  Portable chest x-ray of July 26, 2016 FINDINGS:  The lungs are well-expanded. There is patchy increased density in the lower retrosternal region likely on the left in the lingula. There is no pleural effusion. The heart and mediastinal structures are normal. There surgical clips in the left breast and left axillary region. The bony thorax is unremarkable. IMPRESSION: Subsegmental atelectasis or early pneumonia likely in the lingula. Followup PA and lateral chest X-ray is recommended in 3-4 weeks following trial of antibiotic therapy to ensure resolution and exclude underlying malignancy. Electronically Signed   By: David  Martinique M.D.   On: 02/17/2018 09:53   I personally (independently) visualized and performed the interpretation of the images attached in this note.    Assessment and Plan: 62 y.o. female with  Community-acquired pneumonia not improving much with a azithromycin.  Plan to finish out azithromycin course and start double coverage with Omnicef.  Treat symptomatically also with continued over-the-counter medications, as well as Tessalon Perles and hydrocodone for cough control.  Recheck if not improving.  Return sooner if needed.  Repeat chest x-ray in 4 weeks per radiology recommendation.  Reminder sent  No orders of the defined types were placed in this encounter.  Meds ordered this encounter  Medications  . benzonatate (TESSALON) 200 MG capsule    Sig: Take 1 capsule (200 mg total) by mouth 3 (three) times daily as needed for cough.    Dispense:  45 capsule    Refill:  1  . HYDROcodone-homatropine (HYCODAN) 5-1.5 MG/5ML syrup    Sig: Take 5 mLs by mouth  every 8 (eight) hours as needed for cough.    Dispense:  120 mL    Refill:  0  . cefdinir (OMNICEF) 300 MG capsule    Sig: Take 1 capsule (300 mg total) by mouth 2 (two) times daily.    Dispense:  14 capsule    Refill:  0     Historical information moved to improve visibility of documentation.  Past Medical History:  Diagnosis Date  . Allergy   . Anemia   .  Arthritis    feet  . Basal cell carcinoma   . Breast cancer ([place]) 06/2016   left breast  . Colon polyps   . Eczema   . Genetic testing 08/15/2016   Ms. Pooley underwent genetic testing for hereditary cancer syndrome through [employer]'s 43-gene Common Hereditary Cancers Panel. Ms. 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 Result report is dated 08/15/2016. Please see genetic counseling documentation from 08/17/2016 for further discussion.  [name] History of radiation therapy 01/02/17-01/31/17   left breast was treated to 42.72 Gy in 16 fractions, left breast boost 10 Gy in 5 fractions  . Personal history of chemotherapy   . Personal history of radiation therapy   . PONV (postoperative nausea and vomiting)    Past Surgical History:  Procedure Laterality Date  . BONE SPUR Bilateral 2001 AND 1988  . BREAST BIOPSY    . BREAST LUMPECTOMY Left    07/2016  . BREAST LUMPECTOMY WITH RADIOACTIVE SEED AND SENTINEL LYMPH NODE BIOPSY Left 07/26/2016   Procedure: BREAST LUMPECTOMY WITH RADIOACTIVE SEED AND SENTINEL LYMPH NODE BIOPSY;  Surgeon: [name], MD;  Location: [facility];  Service: General;  Laterality: Left;  . BUNIONECTOMY Bilateral 02/2012  . COLONOSCOPY W/ POLYPECTOMY  08/2007  . DILATION AND CURETTAGE OF UTERUS  2002  . MOHS SURGERY  2010  . PORT-A-CATH REMOVAL N/A 07/18/2017   Procedure: REMOVAL PORT-A-CATH;  Surgeon: [name], MD;  Location: [facility];  Service: General;  Laterality: N/A;  . PORTACATH PLACEMENT Right 07/26/2016   Procedure: INSERTION PORT-A-CATH;  Surgeon: [name], MD;  Location: [facility];  Service: General;  Laterality: Right;   Social History   Tobacco Use  . Smoking status: Never Smoker  . Smokeless tobacco: Never Used  Substance Use Topics  . Alcohol use: Yes    Alcohol/week: 0.0 standard drinks    Comment: 1-2   family history includes Breast cancer  (age of onset: 58) in her maternal aunt; Heart disease in her father and paternal grandmother; Hypertension in her father; Liver cancer (age of onset: 54) in her maternal uncle; Osteoporosis in her sister; Ovarian cancer (age of onset: 36) in her mother; Stroke in her father.  Medications: Current Outpatient Medications  Medication Sig Dispense Refill  . AMBULATORY NON FORMULARY MEDICATION 1. Upper extremity compression sleeve. 2. Compression glove. Dx: Lymphedema. Size and fit as needed. 4 Units prn  . anastrozole (ARIMIDEX) 1 MG tablet TAKE 1 TABLET(1 MG) BY MOUTH DAILY 30 tablet 0  . azithromycin (ZITHROMAX Z-PAK) 250 MG tablet Take 2 tablets (500 mg) on  Day 1,  followed by 1 tablet (250 mg) once daily on Days 2 through 5. 6 tablet 0  . BIOTIN PO Take 1 tablet by mouth daily.    . calcium-vitamin D (OSCAL WITH D) 500-200 MG-UNIT tablet Take 1 tablet by mouth 2 (two) times daily.    . cetirizine (ZYRTEC) 10  MG tablet Take 10 mg by mouth daily as needed (for allergies--Spring/Summer).     . Cholecalciferol (VITAMIN D3) 1000 units CAPS Take 1,000 Units by mouth daily.     . Cyanocobalamin (B-12) 2500 MCG TABS Place 2,500 mcg under the tongue daily.     [name] denosumab (PROLIA) 60 MG/ML SOSY injection Inject 60 mg into the skin every 6 (six) months.    . diphenhydrAMINE (BENADRYL) 25 MG tablet Take 12.5 mg by mouth at bedtime as needed for sleep.    [name] ipratropium (ATROVENT) 0.06 % nasal spray Place 2 sprays into both nostrils 4 (four) times daily as needed for rhinitis. 15 mL 0  . Multiple Vitamin (MULTIVITAMIN) tablet Take 1 tablet by mouth daily.    . predniSONE (DELTASONE) 50 MG tablet Take 1 tablet (50 mg total) by mouth daily. 5 tablet 0  . benzonatate (TESSALON) 200 MG capsule Take 1 capsule (200 mg total) by mouth 3 (three) times daily as needed for cough. 45 capsule 1  . cefdinir (OMNICEF) 300 MG capsule Take 1 capsule (300 mg total) by mouth 2 (two) times daily. 14 capsule 0  .  HYDROcodone-homatropine (HYCODAN) 5-1.5 MG/5ML syrup Take 5 mLs by mouth every 8 (eight) hours as needed for cough. 120 mL 0   No current facility-administered medications for this visit.    Allergies  Allergen Reactions  . Adhesive [Tape] Rash  . Codeine Nausea Only and Other (See Comments)    Dizzy  . Sulfamethoxazole Nausea And Vomiting     Discussed warning signs or symptoms. Please see discharge instructions. Patient expresses understanding.

## 2018-02-26 ENCOUNTER — Encounter: Payer: Self-pay | Admitting: Osteopathic Medicine

## 2018-02-26 ENCOUNTER — Ambulatory Visit (INDEPENDENT_AMBULATORY_CARE_PROVIDER_SITE_OTHER): Payer: BLUE CROSS/BLUE SHIELD | Admitting: Osteopathic Medicine

## 2018-02-26 VITALS — BP 104/72 | HR 73 | Temp 98.2°F | Wt 150.9 lb

## 2018-02-26 DIAGNOSIS — J189 Pneumonia, unspecified organism: Secondary | ICD-10-CM | POA: Diagnosis not present

## 2018-02-26 DIAGNOSIS — Z0000 Encounter for general adult medical examination without abnormal findings: Secondary | ICD-10-CM

## 2018-02-26 NOTE — Progress Notes (Signed)
HPI: Jamie [name] is a 62 y.o. female who  has a past medical history of Allergy, Anemia, Arthritis, Basal cell carcinoma, Breast cancer ([place]) (06/2016), Colon polyps, Eczema, Genetic testing (08/15/2016), History of radiation therapy (01/02/17-01/31/17), Personal history of chemotherapy, Personal history of radiation therapy, and PONV (postoperative nausea and vomiting).  she presents to [facility] today, 02/26/18,  for chief complaint of:  Following up from pneumonia  Recent pneumonia diagnosis but chest x-ray was borderline.  Was treated with Omnicef, Tessalon Perles, Hycodan syrup.  She states the cough is still present but is definitely getting better.  Fatigue seems to be persisting.  No fever/night sweats.  Had some concerns about abnormal chest x-ray, radiologist recommended repeat exam to exclude malignancy, she saw this on the report and became concerned given history of breast cancer.    Past medical history, surgical history, and family history reviewed.  Current medication list and allergy/intolerance information reviewed.   (See remainder of HPI, ROS, Phys Exam below)     ASSESSMENT/PLAN: The primary encounter diagnosis was Pneumonia of left lung due to infectious organism, unspecified part of lung.    Advised that metastasis/lung mass is unlikely, reviewed the x-ray images with the patient.  Pneumonia/infectious process is most likely cause of abnormal chest x-ray, repeat is more of a precaution and then because we would actually be worried about something, but of course if abnormality persists or if she is truly worried about it we can certainly pursue more imaging such as CT scan, or she can discuss with oncology at her upcoming appointment.   Labs ordered for upcoming annual physical.  Preventive care was not performed or coded today   Orders Placed This Encounter  Procedures  . DG Chest 2 View  . CBC  . COMPLETE METABOLIC PANEL  WITH GFR  . Lipid panel  . TSH  . VITAMIN D 25 Hydroxy (Vit-D Deficiency, Fractures)     Follow-up plan: Return for keep scheduled annual next month.  Order placed for chest x-ray 3 weeks from initial study.                 ############################################ ############################################ ############################################ ############################################    Outpatient Encounter Medications as of 02/26/2018  Medication Sig  . AMBULATORY NON FORMULARY MEDICATION 1. Upper extremity compression sleeve. 2. Compression glove. Dx: Lymphedema. Size and fit as needed.  [name] anastrozole (ARIMIDEX) 1 MG tablet TAKE 1 TABLET(1 MG) BY MOUTH DAILY  . BIOTIN PO Take 1 tablet by mouth daily.  . calcium-vitamin D (OSCAL WITH D) 500-200 MG-UNIT tablet Take 1 tablet by mouth 2 (two) times daily.  . cefdinir (OMNICEF) 300 MG capsule Take 1 capsule (300 mg total) by mouth 2 (two) times daily.  . cetirizine (ZYRTEC) 10 MG tablet Take 10 mg by mouth daily as needed (for allergies--Spring/Summer).   . Cholecalciferol (VITAMIN D3) 1000 units CAPS Take 1,000 Units by mouth daily.   . Cyanocobalamin (B-12) 2500 MCG TABS Place 2,500 mcg under the tongue daily.   [name] denosumab (PROLIA) 60 MG/ML SOSY injection Inject 60 mg into the skin every 6 (six) months.  . diphenhydrAMINE (BENADRYL) 25 MG tablet Take 12.5 mg by mouth at bedtime as needed for sleep.  . Multiple Vitamin (MULTIVITAMIN) tablet Take 1 tablet by mouth daily.  [name] azithromycin (ZITHROMAX Z-PAK) 250 MG tablet Take 2 tablets (500 mg) on  Day 1,  followed by 1 tablet (250 mg) once daily on Days 2 through 5. (Patient not  taking: Reported on 02/26/2018)  . benzonatate (TESSALON) 200 MG capsule Take 1 capsule (200 mg total) by mouth 3 (three) times daily as needed for cough. (Patient not taking: Reported on 02/26/2018)  . HYDROcodone-homatropine (HYCODAN) 5-1.5 MG/5ML syrup Take 5 mLs by mouth every 8  (eight) hours as needed for cough. (Patient not taking: Reported on 02/26/2018)  . ipratropium (ATROVENT) 0.06 % nasal spray Place 2 sprays into both nostrils 4 (four) times daily as needed for rhinitis. (Patient not taking: Reported on 02/26/2018)  . predniSONE (DELTASONE) 50 MG tablet Take 1 tablet (50 mg total) by mouth daily. (Patient not taking: Reported on 02/26/2018)   No facility-administered encounter medications on file as of 02/26/2018.    Allergies  Allergen Reactions  . Adhesive [Tape] Rash  . Codeine Nausea Only and Other (See Comments)    Dizzy  . Sulfamethoxazole Nausea And Vomiting      Review of Systems:  Constitutional: +recent illness improving  HEENT: No  headache, no vision change  Cardiac: No  chest pain, No  pressure, No palpitations  Respiratory:  No  shortness of breath. +Cough  Gastrointestinal: No  abdominal pain, no change on bowel habits  Neurologic: No  weakness, No  Dizziness  Psychiatric: No  concerns with depression, No  concerns with anxiety  Exam:  BP 104/72 (BP Location: Left Arm, Patient Position: Sitting, Cuff Size: Normal)   Pulse 73   Temp 98.2 F (36.8 C) (Oral)   Wt 150 lb 14.4 oz (68.4 kg)   SpO2 100%   BMI 25.11 kg/m   Constitutional: VS see above. General Appearance: alert, well-developed, well-nourished, NAD  Eyes: Normal lids and conjunctive, non-icteric sclera  Ears, Nose, Mouth, Throat: MMM, Normal external inspection ears/nares/mouth/lips/gums.  Neck: No masses, trachea midline.   Respiratory: Normal respiratory effort. no wheeze, no rhonchi, no rales  Cardiovascular: S1/S2 normal, no murmur, no rub/gallop auscultated. RRR.   Musculoskeletal: Gait normal. Symmetric and independent movement of all extremities  Neurological: Normal balance/coordination. No tremor.  Skin: warm, dry, intact.   Psychiatric: Normal judgment/insight. Normal mood and affect. Oriented x3.   Visit summary with medication list and  pertinent instructions was printed for patient to review, advised to alert [place] if any changes needed. All questions at time of visit were answered - patient instructed to contact office with any additional concerns. ER/RTC precautions were reviewed with the patient and understanding verbalized.   Follow-up plan: Return for keep scheduled annual next month.  Note: Total time spent 25 minutes, greater than 50% of the visit was spent face-to-face counseling and coordinating care for the following: The primary encounter diagnosis was Pneumonia of left lung due to infectious organism, unspecified part of lung. A diagnosis of Annual physical exam was also pertinent to this visit.[name]  Please note: voice recognition software was used to produce this document, and typos may escape review. Please contact Dr. Sheppard Coil for any needed clarifications.

## 2018-02-28 ENCOUNTER — Other Ambulatory Visit: Payer: Self-pay | Admitting: Oncology

## 2018-03-24 ENCOUNTER — Ambulatory Visit (INDEPENDENT_AMBULATORY_CARE_PROVIDER_SITE_OTHER): Payer: BLUE CROSS/BLUE SHIELD

## 2018-03-24 DIAGNOSIS — J189 Pneumonia, unspecified organism: Secondary | ICD-10-CM | POA: Diagnosis not present

## 2018-03-25 DIAGNOSIS — J189 Pneumonia, unspecified organism: Secondary | ICD-10-CM | POA: Diagnosis not present

## 2018-03-25 DIAGNOSIS — Z0000 Encounter for general adult medical examination without abnormal findings: Secondary | ICD-10-CM | POA: Diagnosis not present

## 2018-03-26 LAB — COMPLETE METABOLIC PANEL WITH GFR
AG Ratio: 2 (calc) (ref 1.0–2.5)
ALT: 10 U/L (ref 6–29)
AST: 16 U/L (ref 10–35)
Albumin: 4.5 g/dL (ref 3.6–5.1)
Alkaline phosphatase (APISO): 39 U/L (ref 33–130)
BILIRUBIN TOTAL: 0.5 mg/dL (ref 0.2–1.2)
BUN: 12 mg/dL (ref 7–25)
CHLORIDE: 105 mmol/L (ref 98–110)
CO2: 29 mmol/L (ref 20–32)
Calcium: 9.6 mg/dL (ref 8.6–10.4)
Creat: 0.77 mg/dL (ref 0.50–0.99)
GFR, EST NON AFRICAN AMERICAN: 82 mL/min/{1.73_m2} (ref >=60)
GFR, Est African American: 95 mL/min/{1.73_m2} (ref >=60)
GLUCOSE: 93 mg/dL (ref 65–99)
Globulin: 2.3 g/dL (calc) (ref 1.9–3.7)
POTASSIUM: 4.2 mmol/L (ref 3.5–5.3)
Sodium: 141 mmol/L (ref 135–146)
Total Protein: 6.8 g/dL (ref 6.1–8.1)

## 2018-03-26 LAB — LIPID PANEL
Cholesterol: 253 mg/dL — ABNORMAL HIGH (ref <200)
HDL: 66 mg/dL (ref >50)
LDL Cholesterol (Calc): 162 mg/dL (calc) — ABNORMAL HIGH
Non-HDL Cholesterol (Calc): 187 mg/dL (calc) — ABNORMAL HIGH (ref <130)
TRIGLYCERIDES: 123 mg/dL (ref <150)
Total CHOL/HDL Ratio: 3.8 (calc) (ref <5.0)

## 2018-03-26 LAB — CBC
HEMATOCRIT: 38.8 % (ref 35.0–45.0)
HEMOGLOBIN: 13.3 g/dL (ref 11.7–15.5)
MCH: 31.6 pg (ref 27.0–33.0)
MCHC: 34.3 g/dL (ref 32.0–36.0)
MCV: 92.2 fL (ref 80.0–100.0)
MPV: 9.4 fL (ref 7.5–12.5)
Platelets: 306 10*3/uL (ref 140–400)
RBC: 4.21 10*6/uL (ref 3.80–5.10)
RDW: 12.7 % (ref 11.0–15.0)
WBC: 5.2 10*3/uL (ref 3.8–10.8)

## 2018-03-26 LAB — VITAMIN D 25 HYDROXY (VIT D DEFICIENCY, FRACTURES): Vit D, 25-Hydroxy: 39 ng/mL (ref 30–100)

## 2018-03-26 LAB — TSH: TSH: 4.42 mIU/L (ref 0.40–4.50)

## 2018-04-03 ENCOUNTER — Encounter: Payer: Self-pay | Admitting: Osteopathic Medicine

## 2018-04-03 ENCOUNTER — Ambulatory Visit (INDEPENDENT_AMBULATORY_CARE_PROVIDER_SITE_OTHER): Payer: BLUE CROSS/BLUE SHIELD | Admitting: Osteopathic Medicine

## 2018-04-03 VITALS — BP 106/61 | HR 67 | Temp 98.2°F | Wt 151.9 lb

## 2018-04-03 DIAGNOSIS — Z5320 Procedure and treatment not carried out because of patient's decision for unspecified reasons: Secondary | ICD-10-CM

## 2018-04-03 DIAGNOSIS — Z0000 Encounter for general adult medical examination without abnormal findings: Secondary | ICD-10-CM | POA: Diagnosis not present

## 2018-04-03 DIAGNOSIS — Z7189 Other specified counseling: Secondary | ICD-10-CM | POA: Diagnosis not present

## 2018-04-03 NOTE — Patient Instructions (Addendum)
General Preventive Care  Most recent routine screening lipids/other labs: done already.   Everyone should have blood pressure checked once per year.   Tobacco: don't! Alcohol: responsible moderation is ok for most adults - if you have concerns about your alcohol intake, please talk to me! Recreational/Illicit Drugs: don't!  Exercise: as tolerated to reduce risk of cardiovascular disease and diabetes. Strength training will also prevent osteoporosis.   Mental health: if need for mental health care (medicines, counseling, other), or concerns about moods, please let me know!   Sexual health: if need for STD testing, or if concerns with libido/pain problems, please let me know!  Vaccines  Flu vaccine: recommended for almost everyone, every fall (by Halloween! Flu is scary!)  Shingles vaccine: Shingrix recommended after age 101 (Zostavax previously, Shingrix is a newer one) - will call you when we have this in stock for you   Pneumonia vaccines: Prevnar and Pneumovax recommended after age 45  Tetanus booster: Tdap recommended every 10 years - yours last done 2012 Cancer screenings   Colon cancer screening: please follow as directed with GI   Breast cancer screening: follow-up with oncology as directed   Cervical cancer screening:can discontinue Paps. If any vaginal bleeding, please come see me!   Lung cancer screening: not needed for non-smokers Other . Bone Density Test: recommended every 2 years . Advanced Directive: Living Will and/or Healthcare Power of Attorney recommended for all adults, regardless of age or health!

## 2018-04-03 NOTE — Progress Notes (Signed)
HPI: Jamie [name] is a 62 y.o. female who  has a past medical history of Allergy, Anemia, Arthritis, Basal cell carcinoma, Breast cancer ([place]) (06/2016), Colon polyps, Eczema, Genetic testing (08/15/2016), History of radiation therapy (01/02/17-01/31/17), Personal history of chemotherapy, Personal history of radiation therapy, and PONV (postoperative nausea and vomiting).  she presents to [facility] today, 04/03/18,  for chief complaint of: Annual Physical   Patient here for annual physical / wellness exam.  See preventive care reviewed as below.  Recent labs reviewed in detail with the patient.   Additional concerns today include: None    Past medical, surgical, social and family history reviewed:  Patient Active Problem List   Diagnosis Date Noted  . Lumbago 08/16/2017  . Basal cell carcinoma 07/17/2017  . Vaginismus 07/17/2017  . Abnormal TSH 03/28/2017  . Diarrhea 11/05/2016  . Port catheter in place 10/23/2016  . Genetic testing 08/15/2016  . Malignant neoplasm of upper-outer quadrant of left breast in female, estrogen receptor positive ([place]) 07/10/2016  . Allergic rhinitis 06/13/2016  . History of basal cell carcinoma 06/13/2016  . Eczema 06/13/2016  . Hyperlipemia 06/13/2016  . History of colon polyps 06/13/2016  . 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 RLS (restless legs syndrome) 06/13/2016  . Hypothyroidism 07/20/2013  . Bunion 01/21/2013  . Osteopenia 01/20/2013    Past Surgical History:  Procedure Laterality Date  . BONE SPUR Bilateral 2001 AND 1988  . BREAST BIOPSY    . BREAST LUMPECTOMY Left    07/2016  . BREAST LUMPECTOMY WITH RADIOACTIVE SEED AND SENTINEL LYMPH NODE BIOPSY Left 07/26/2016   Procedure: BREAST LUMPECTOMY WITH RADIOACTIVE SEED AND SENTINEL LYMPH NODE BIOPSY;  Surgeon: [name], MD;  Location: [facility];  Service: General;  Laterality: Left;  . BUNIONECTOMY Bilateral 02/2012  .  COLONOSCOPY W/ POLYPECTOMY  08/2007  . DILATION AND CURETTAGE OF UTERUS  2002  . MOHS SURGERY  2010  . PORT-A-CATH REMOVAL N/A 07/18/2017   Procedure: REMOVAL PORT-A-CATH;  Surgeon: [name], MD;  Location: [facility];  Service: General;  Laterality: N/A;  . PORTACATH PLACEMENT Right 07/26/2016   Procedure: INSERTION PORT-A-CATH;  Surgeon: [name], MD;  Location: [facility];  Service: General;  Laterality: Right;    Social History   Tobacco Use  . Smoking status: Never Smoker  . Smokeless tobacco: Never Used  Substance Use Topics  . Alcohol use: Yes    Alcohol/week: 0.0 standard drinks    Comment: 1-2    Family History  Problem Relation Age of Onset  . Ovarian cancer Mother 62  . Hypertension Father   . Stroke Father   . Heart disease Father   . Breast cancer Maternal Aunt 77       recurred at 21  . Heart disease Paternal Grandmother   . Osteoporosis Sister   . Liver cancer Maternal Uncle 77     Current medication list and allergy/intolerance information reviewed:    Current Outpatient Medications  Medication Sig Dispense Refill  . AMBULATORY NON FORMULARY MEDICATION 1. Upper extremity compression sleeve. 2. Compression glove. Dx: Lymphedema. Size and fit as needed. 4 Units prn  . anastrozole (ARIMIDEX) 1 MG tablet TAKE 1 TABLET(1 MG) BY MOUTH DAILY 30 tablet 2  . BIOTIN PO Take 1 tablet by mouth daily.    . calcium-vitamin D (OSCAL WITH D) 500-200 MG-UNIT tablet Take 1 tablet by mouth 2 (two) times daily.    . cetirizine (ZYRTEC)  10 MG tablet Take 10 mg by mouth daily as needed (for allergies--Spring/Summer).     . Cholecalciferol (VITAMIN D3) 1000 units CAPS Take 1,000 Units by mouth daily.     . Cyanocobalamin (B-12) 2500 MCG TABS Place 2,500 mcg under the tongue daily.     [name] denosumab (PROLIA) 60 MG/ML SOSY injection Inject 60 mg into the skin every 6 (six) months.    . diphenhydrAMINE (BENADRYL) 25 MG tablet Take 12.5 mg by mouth at bedtime as needed  for sleep.    . Multiple Vitamin (MULTIVITAMIN) tablet Take 1 tablet by mouth daily.     No current facility-administered medications for this visit.     Allergies  Allergen Reactions  . Adhesive [Tape] Rash  . Codeine Nausea Only and Other (See Comments)    Dizzy  . Sulfamethoxazole Nausea And Vomiting      Review of Systems:  Constitutional:  No  fever, no chills, No recent illness, No unintentional weight changes. No significant fatigue.   HEENT: No  headache, no vision change, no hearing change, No sore throat, No  sinus pressure  Cardiac: No  chest pain, No  pressure, No palpitations, No  Orthopnea  Respiratory:  No  shortness of breath. No  Cough  Gastrointestinal: No  abdominal pain, No  nausea, No  vomiting,  No  blood in stool, No  diarrhea, No  constipation   Musculoskeletal: No new myalgia/arthralgia  Skin: No  Rash, No other wounds/concerning lesions  Genitourinary: No  incontinence, No  abnormal genital bleeding, No abnormal genital discharge  Hem/Onc: No  easy bruising/bleeding, No  abnormal lymph node  Endocrine: No cold intolerance,  No heat intolerance. No polyuria/polydipsia/polyphagia   Neurologic: No  weakness, No  dizziness, No  slurred speech/focal weakness/facial droop  Psychiatric: No  concerns with depression, No  concerns with anxiety, No sleep problems, No mood problems  Exam:  BP 106/61 (BP Location: Left Arm, Patient Position: Sitting, Cuff Size: Normal)   Pulse 67   Temp 98.2 F (36.8 C) (Oral)   Wt 151 lb 14.4 oz (68.9 kg)   BMI 25.28 kg/m   Constitutional: VS see above. General Appearance: alert, well-developed, well-nourished, NAD  Eyes: Normal lids and conjunctive, non-icteric sclera  Ears, Nose, Mouth, Throat: MMM, Normal external inspection ears/nares/mouth/lips/gums. TM normal bilaterally. Pharynx/tonsils no erythema, no exudate. Nasal mucosa normal.   Neck: No masses, trachea midline. No thyroid enlargement. No  tenderness/mass appreciated. No lymphadenopathy  Respiratory: Normal respiratory effort. no wheeze, no rhonchi, no rales  Cardiovascular: S1/S2 normal, no murmur, no rub/gallop auscultated. RRR. No lower extremity edema. Pedal pulse II/IV bilaterally DP and PT. No carotid bruit or JVD. No abdominal aortic bruit.  Gastrointestinal: Nontender, no masses. No hepatomegaly, no splenomegaly. No hernia appreciated. Bowel sounds normal. Rectal exam deferred.   Musculoskeletal: Gait normal. No clubbing/cyanosis of digits.   Neurological: Normal balance/coordination. No tremor. No cranial nerve deficit on limited exam. Motor and sensation intact and symmetric. Cerebellar reflexes intact.   Skin: warm, dry, intact. No rash/ulcer. No concerning nevi or subq nodules on limited exam.    Psychiatric: Normal judgment/insight. Normal mood and affect. Oriented x3.      ASSESSMENT/PLAN:   Annual physical exam  Pap smear of cervix declined - Paps painful, very remote Hx abn, low risk, monogamous w/ husband x40+ years  Advance directive discussed with patient - would not want extraordinary measures or prolonged futile life support in a terminal illness situation. Full code otherwise.  Immunization History  Administered Date(s) Administered  . Influenza,inj,Quad PF,6+ Mos 02/18/2014, 02/13/2017, 01/14/2018  . Influenza-Unspecified 03/24/2009, 03/08/2015  . Td 05/17/1999  . Tdap 10/30/2010  . Zoster 02/15/2015   The 10-year ASCVD risk score Mikey Bussing DC Brooke Bonito., et al., 2013) is: 3.3%   Values used to calculate the score:     Age: 1 years     Sex: Female     Is Non-Hispanic African American: No     Diabetic: No     Tobacco smoker: No     Systolic Blood Pressure: 423 mmHg     Is BP treated: No     HDL Cholesterol: 66 mg/dL     Total Cholesterol: 253 mg/dL  Patient Instructions  General Preventive Care  Most recent routine screening lipids/other labs: done already.   Everyone should have blood  pressure checked once per year.   Tobacco: don't! Alcohol: responsible moderation is ok for most adults - if you have concerns about your alcohol intake, please talk to me! Recreational/Illicit Drugs: don't!  Exercise: as tolerated to reduce risk of cardiovascular disease and diabetes. Strength training will also prevent osteoporosis.   Mental health: if need for mental health care (medicines, counseling, other), or concerns about moods, please let me know!   Sexual health: if need for STD testing, or if concerns with libido/pain problems, please let me know!  Vaccines  Flu vaccine: recommended for almost everyone, every fall (by Halloween! Flu is scary!)  Shingles vaccine: Shingrix recommended after age 65 (Zostavax previously, Shingrix is a newer one) - will call you when we have this in stock for you   Pneumonia vaccines: Prevnar and Pneumovax recommended after age 51  Tetanus booster: Tdap recommended every 10 years - yours last done 2012 Cancer screenings   Colon cancer screening: please follow as directed with GI   Breast cancer screening: follow-up with oncology as directed   Cervical cancer screening:can discontinue Paps. If any vaginal bleeding, please come see me!   Lung cancer screening: not needed for non-smokers Other . Bone Density Test: recommended every 2 years . Advanced Directive: Living Will and/or Healthcare Power of Attorney recommended for all adults, regardless of age or health!          Visit summary with medication list and pertinent instructions was printed for patient to review. All questions at time of visit were answered - patient instructed to contact office with any additional concerns. ER/RTC precautions were reviewed with the patient.   Follow-up plan: Return in about 1 year (around 04/04/2019) for [place], or sooner if needed .     Please note: voice recognition software was used to produce this document, and typos may escape  review. Please contact Dr. Sheppard Coil for any needed clarifications.

## 2018-04-04 ENCOUNTER — Telehealth: Payer: Self-pay

## 2018-04-04 NOTE — Telephone Encounter (Signed)
Left a detailed vm msg for pt regarding provider's note. Call back info provided.

## 2018-04-04 NOTE — Telephone Encounter (Signed)
CoQ10 can help with people who can experience muscle aches with red yeast rice and statin medications. She doesn't NEED to take it, but might find it helpful. It shouldn't cause any problems.

## 2018-04-04 NOTE — Telephone Encounter (Signed)
Pt called - according to the supplement info for red yeast rice, she should also take Co Q10 supplement. Pt wants to know if that's ok and how often should she take Co Q10 supplement. Pls advise, thanks.

## 2018-04-10 ENCOUNTER — Telehealth: Payer: Self-pay | Admitting: Osteopathic Medicine

## 2018-04-10 NOTE — Telephone Encounter (Signed)
Added

## 2018-04-10 NOTE — Telephone Encounter (Signed)
-----   Message from Emeterio Reeve, DO sent at 04/03/2018 12:49 PM EST ----- Regarding: shingrix shingrix list!

## 2018-04-20 NOTE — Progress Notes (Signed)
Jamie [name]  [phone] [phone]     ID: Jamie [name] DOB: 23-Jan-1956  MR#: [id number]  VQM#:[id number]  Patient Care Team: Emeterio Reeve, DO as PCP - General (Osteopathic Medicine) [name], MD as Consulting Physician (General Surgery) Dominyck Reser, [name], MD as Consulting Physician (Oncology) [name], MD as Consulting Physician (Radiation Oncology) [name], MD as Referring Physician Armbruster, Carlota Raspberry, MD as Consulting Physician (Gastroenterology) Delice Bison, Charlestine Massed, NP as Nurse Practitioner (Hematology and Oncology) OTHER MD:  CHIEF COMPLAINT: 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 This showed a possible mass in the left breast. On 07/02/2016 she underwent left diagnostic mammography with tomography and left breast ultrasonography. The breast density was category C. In the left breast upper outer quadrant there was a microlobulated mass which was not directly palpable, although there was slight thickening in the left breast 1:00 position. Targeted ultrasonography confirmed a solid hypoechoic microlobulated mass in the left breast 1:00 radiant 4 cm from the nipple, measuring 0.8 cm.  On 07/03/2016 Jamie [name] underwent biopsy of the left breast mass in question, and this showed ([id number]) invasive ductal carcinoma, grade 2 or 3, with extracellular mucin, estrogen receptor 5% positive with weak staining intensity, progesterone receptor negative, with an MIB-1 of 50%, and HER-2 amplified, the signals ratio being 5.71 and the number per cell 14.55.  Her subsequent history is as detailed below  INTERVAL HISTORY: Jamie [name] returns today for follow-up of her estrogen receptor positive with HER-2 amplification. She is  accompanied by her husband.  The patient continues on Anastrozole, which is is tolerating well. She experiences some hot flashes occassionally; they do not wake her up. She has no issue with vaginal dryness.   Jamie [name]'s last bone density screening on 02/26/2017, showed a T-score of -2.0, which is considered osteopenic.   The patient also continues on Prolia/denosuman, which she is tolerating well. She does not experience any symptoms that she can recall, and she takes extra calcium on the days of her dose administration. She is due for a dose today.    REVIEW OF SYSTEMS: Jamie [name] is doing very well. She is very active. She goes to the gym three times a week; she participates in yoga and completes elliptical and walking exercises. For Thanksgiving she had family over at her home. The patient denies unusual headaches, visual changes, nausea, vomiting, or dizziness. There has been no unusual cough, phlegm production, or pleurisy. This been no change in bowel or bladder habits. The patient denies unexplained fatigue or unexplained weight loss, bleeding, rash, or fever. A detailed review of systems was otherwise noncontributory.    PAST MEDICAL HISTORY: Past Medical History:  Diagnosis Date  . Allergy   . Anemia   . Arthritis    feet  . Basal cell carcinoma   . Breast cancer ([place]) 06/2016   left breast  . Colon polyps   . Eczema   . Genetic testing 08/15/2016   Jamie [name] underwent genetic testing for hereditary cancer syndrome through [facility]'s 43-gene Common Hereditary Cancers Panel. Jamie [name] testing revealed a single pathogenic mutation in Jamie [name] and a variant of uncertain significance (VUS) in Jamie [name]. Result report is dated 08/15/2016. Please see genetic counseling documentation from 08/17/2016 for further discussion.  Jamie [name] Kitchen History of radiation therapy 01/02/17-01/31/17   left breast was treated to 42.72 Gy  in 16 fractions, left breast boost 10 Gy in 5 fractions  . Personal history of chemotherapy    . Personal history of radiation therapy   . PONV (postoperative nausea and vomiting)     PAST SURGICAL HISTORY: Past Surgical History:  Procedure Laterality Date  . BONE SPUR Bilateral 2001 AND 1988  . BREAST BIOPSY    . BREAST LUMPECTOMY Left    07/2016  . BREAST LUMPECTOMY WITH RADIOACTIVE SEED AND SENTINEL LYMPH NODE BIOPSY Left 07/26/2016   Procedure: BREAST LUMPECTOMY WITH RADIOACTIVE SEED AND SENTINEL LYMPH NODE BIOPSY;  Surgeon: [name], MD;  Location: [place];  Service: General;  Laterality: Left;  . BUNIONECTOMY Bilateral 02/2012  . COLONOSCOPY W/ POLYPECTOMY  08/2007  . DILATION AND CURETTAGE OF UTERUS  2002  . MOHS SURGERY  2010  . PORT-A-CATH REMOVAL N/A 07/18/2017   Procedure: REMOVAL PORT-A-CATH;  Surgeon: [name], MD;  Location: [place];  Service: General;  Laterality: N/A;  . PORTACATH PLACEMENT Right 07/26/2016   Procedure: INSERTION PORT-A-CATH;  Surgeon: [name], MD;  Location: [facility];  Service: General;  Laterality: Right;    FAMILY HISTORY Family History  Problem Relation Age of Onset  . Ovarian cancer Mother 35  . Hypertension Father   . Stroke Father   . Heart disease Father   . Breast cancer Maternal Aunt 77       recurred at 80  . Heart disease Paternal Grandmother   . Osteoporosis Sister   . Liver cancer Maternal Uncle 54  The patient's father died at age 43, with some form of skin cancer, most likely melanoma. The patient's mother died at the age of 62 with ovarian cancer, which had been diagnosed a few months prior. The patient had no brothers, 1 sister. A maternal aunt was diagnosed with breast cancer at age 21, recurrent age 45.  GYNECOLOGIC HISTORY:  No LMP recorded. Patient is postmenopausal. Menarche age 37, the patient is Jamie [name] P0. She stopped having periods approximately age 57. 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 Her husband Jamie [name] worked as a Dance movement psychotherapist for a [employer]. Their last name is pronounced foh-TEE-ah and they tell me it means "burning" in [place]. Their children are adopted. [name] lives in [place] and works in Press photographer, and Perla lives in [place] and is an Scientist, water quality. The patient has 3 grandchildren. She is not a Ambulance person.    ADVANCED DIRECTIVES: In place   HEALTH MAINTENANCE: Social History   Tobacco Use  . Smoking status: Never Smoker  . Smokeless tobacco: Never Used  Substance Use Topics  . Alcohol use: Yes    Alcohol/week: 0.0 standard drinks    Comment: 1-2  . Drug use: No     Colonoscopy:2009  WUJ:WJXBJY 2018  Bone density: 02/26/2017 T-score of -2.0 at the left femur neck    Allergies  Allergen Reactions  . Adhesive [Tape] Rash  . Codeine Nausea Only and Other (See Comments)    Dizzy  . Sulfamethoxazole Nausea And Vomiting    Current Outpatient Medications  Medication Sig Dispense Refill  . anastrozole (ARIMIDEX) 1 MG tablet TAKE 1 TABLET(1 MG) BY MOUTH DAILY 90 tablet 4  . BIOTIN PO Take 1 tablet by mouth daily.    . calcium-vitamin D (OSCAL WITH D) 500-200 MG-UNIT tablet Take 1 tablet by mouth 2 (two) times daily.    Jamie [name] Kitchen  cetirizine (ZYRTEC) 10 MG tablet Take 10 mg by mouth daily as needed (for allergies--Spring/Summer).     . Cholecalciferol (VITAMIN D3) 1000 units CAPS Take 1,000 Units by mouth daily.     . Cyanocobalamin (B-12) 2500 MCG TABS Place 2,500 mcg under the tongue daily.     Jamie [name] Kitchen denosumab (PROLIA) 60 MG/ML SOSY injection Inject 60 mg into the skin every 6 (six) months.    . diphenhydrAMINE (BENADRYL) 25 MG tablet Take 12.5 mg by mouth at bedtime as needed for sleep.    . Multiple Vitamin (MULTIVITAMIN) tablet Take 1 tablet by mouth daily.     No current facility-administered medications for this visit.     OBJECTIVE: Middle-aged white woman who appears well  Vitals:   04/22/18 1108  BP: 105/66  Pulse: 64  Resp: 18    Temp: 98.9 F (37.2 C)  SpO2: 100%     Body mass index is 25.09 kg/m.   Filed Weights   04/22/18 1108  Weight: 150 lb 12.8 oz (68.4 kg)     ECOG FS:0 - Asymptomatic  Sclerae unicteric, pupils round and equal Oropharynx clear and moist No cervical or supraclavicular adenopathy Lungs no rales or rhonchi Heart regular rate and rhythm Abd soft, nontender, positive bowel sounds MSK no focal spinal tenderness, no upper extremity lymphedema Neuro: nonfocal, well oriented, appropriate affect Breasts: The right breast is benign.  The left breast is status post lumpectomy.  It also received radiation.  There is no evidence of local recurrence.  Both axillae are benign.  LAB RESULTS:  CMP     Component Value Date/Time   NA 141 03/25/2018 0922   NA 140 05/15/2017 0920   K 4.2 03/25/2018 0922   K 4.3 05/15/2017 0920   CL 105 03/25/2018 0922   CO2 29 03/25/2018 0922   CO2 25 05/15/2017 0920   GLUCOSE 93 03/25/2018 0922   GLUCOSE 94 05/15/2017 0920   BUN 12 03/25/2018 0922   BUN 11.5 05/15/2017 0920   CREATININE 0.77 03/25/2018 0922   CREATININE 0.8 05/15/2017 0920   CALCIUM 9.6 03/25/2018 0922   CALCIUM 9.3 05/15/2017 0920   PROT 6.8 03/25/2018 0922   PROT 6.5 05/15/2017 0920   ALBUMIN 4.2 10/24/2017 0930   ALBUMIN 3.8 05/15/2017 0920   AST 16 03/25/2018 0922   AST 18 10/24/2017 0930   AST 17 05/15/2017 0920   ALT 10 03/25/2018 0922   ALT 13 10/24/2017 0930   ALT 10 05/15/2017 0920   ALKPHOS 62 10/24/2017 0930   ALKPHOS 53 05/15/2017 0920   BILITOT 0.5 03/25/2018 0922   BILITOT 0.3 10/24/2017 0930   BILITOT 0.36 05/15/2017 0920   GFRNONAA 82 03/25/2018 0922   GFRAA 95 03/25/2018 0922    INo results found for: SPEP, UPEP  Lab Results  Component Value Date   WBC 4.9 04/22/2018   NEUTROABS 2.6 04/22/2018   HGB 12.7 04/22/2018   HCT 38.6 04/22/2018   MCV 95.5 04/22/2018   PLT 257 04/22/2018      Chemistry      Component Value Date/Time   NA 141 03/25/2018  0922   NA 140 05/15/2017 0920   K 4.2 03/25/2018 0922   K 4.3 05/15/2017 0920   CL 105 03/25/2018 0922   CO2 29 03/25/2018 0922   CO2 25 05/15/2017 0920   BUN 12 03/25/2018 0922   BUN 11.5 05/15/2017 0920   CREATININE 0.77 03/25/2018 0922   CREATININE 0.8 05/15/2017 0920   GLU 73  08/02/2014      Component Value Date/Time   CALCIUM 9.6 03/25/2018 0922   CALCIUM 9.3 05/15/2017 0920   ALKPHOS 62 10/24/2017 0930   ALKPHOS 53 05/15/2017 0920   AST 16 03/25/2018 0922   AST 18 10/24/2017 0930   AST 17 05/15/2017 0920   ALT 10 03/25/2018 0922   ALT 13 10/24/2017 0930   ALT 10 05/15/2017 0920   BILITOT 0.5 03/25/2018 0922   BILITOT 0.3 10/24/2017 0930   BILITOT 0.36 05/15/2017 0920       No results found for: LABCA2  No components found for: LABCA125  No results for input(s): INR in the last 168 hours.  Urinalysis    Component Value Date/Time   COLORURINE COLORLESS (A) 12/22/2016 2105   APPEARANCEUR CLEAR 12/22/2016 2105   LABSPEC 1.005 12/22/2016 2105   PHURINE 7.0 12/22/2016 2105   GLUCOSEU NEGATIVE 12/22/2016 2105   HGBUR NEGATIVE 12/22/2016 2105   BILIRUBINUR negative 08/16/2017 0855   KETONESUR NEGATIVE 12/22/2016 2105   PROTEINUR negative 08/16/2017 0855   PROTEINUR NEGATIVE 12/22/2016 2105   UROBILINOGEN 0.2 08/16/2017 0855   NITRITE negative 08/16/2017 0855   NITRITE NEGATIVE 12/22/2016 2105   LEUKOCYTESUR Negative 08/16/2017 0855     STUDIES: Dg Chest 2 View  Result Date: 03/24/2018 CLINICAL DATA:  Follow-up pneumonia. History of breast cancer with lumpectomy and radiation EXAM: CHEST - 2 VIEW COMPARISON:  02/17/2018 FINDINGS: Interval clearing of subtle airspace disease in the lingula. Lungs are now clear. Negative for heart failure. No mass or effusion. Surgical clips in the left breast. IMPRESSION: Interval clearing of lingular infiltrate.  Lungs are now clear. Electronically Signed   By: Franchot Gallo M.D.   On: 03/24/2018 16:59    ELIGIBLE FOR  AVAILABLE RESEARCH PROTOCOL: no  ASSESSMENT: 62 y.o. [place], [name] woman status post biopsy of the left breast upper outer quadrant lesion 07/03/2016 showing a clinical T1b N0, stage 1B invasive ductal carcinoma, grade 2 or 3, estrogen receptor weakly positive at 5%, progesterone receptor negative, but HER-2 strongly amplified, with an MIB-1 of 50%.  (1) status post left lumpectomy with sentinel lymph node sampling 07/26/2016 for a pT1c pN0, stage IA invasive ductal carcinoma, grade 3, with extracellular mucin, and negative margins  (2) chemotherapy consisting of Paclitaxel weekly starting 09/18/2016, discontinued after one cycle due to peripheral neuropathy.   (a) Gemcitabine and Carboplatin started 10/02/2016, repeated days 1 and 8 of each 21 day cycle to a total of 8 doses (4 cycles)-- final dose 12/18/2016  (3) trastuzumab started 08/07/2016, completed 12 months on 07/17/2017  (a) baseline echocardiogram 07/25/2016 shows an ejection fraction of 60-65%  (b) repeat echocardiogram 10/22/2016 shows stable ejection fraction  (c) echocardiogram January 24, 2017 found an ejection fraction in the 55-60% range.  (d) echocardiogram 05/02/2017 showed an ejection fraction of 55-60%  (e) final echocardiogram on 08/06/2017 showed an ejection fraction in the 55-60% range  (4) adjuvant radiation 01/02/2017-01/31/2017:  Left breast was treated to 42.72 Gy in 16 fractions and boosted to 10 Gy in 5 fractions.  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   (a) associated with increased colorectal  cancer risk,  possibly breast cancer (at least in [place])  (b) colonoscopy 09/11/2007/ Ezzie Dural  (c) colonoscopy 04/17/2017/ Armbruster-- next due 2023   PLAN: Asmi will soon be 2 years out from definitive surgery for her breast cancer with no evidence of disease recurrence.  This is very favorable.  She is tolerating anastrozole well and the plan will be to continue that a minimum of 5 years.  She is receiving Prolia for her significant osteopenia.  She will receive a dose today and the next one in June.  I am going to see her with the June dose and from that point her visits here will be on a yearly basis.  She of course will continue to receive the Prolia every 6 months.  She will have a repeat bone density October 2020.  At that time we will decide whether to continue or not  She has a slight indentation at the site of her prior port and there are minimal telangiectasias there.  This is all entirely normal.  If she wished to have that checked by plastics we could do it but at this point she does not want any more surgery of any type.  She knows to call for any other issues that may develop before the next visit.  Rakhi Romagnoli, [name], MD  04/22/18 11:27 AM Medical Oncology and Hematology [facility] [address] [place], [place] [address] Tel. [phone]    Fax. [phone]   I, Jacqualyn Posey am acting as a Education administrator for [name], MD.   I, Lurline Del MD, have reviewed the above documentation for accuracy and completeness, and I agree with the above.

## 2018-04-21 ENCOUNTER — Other Ambulatory Visit: Payer: Self-pay | Admitting: *Deleted

## 2018-04-21 DIAGNOSIS — C50412 Malignant neoplasm of upper-outer quadrant of left female breast: Secondary | ICD-10-CM

## 2018-04-21 DIAGNOSIS — Z170 Estrogen receptor positive status [ER+]: Principal | ICD-10-CM

## 2018-04-22 ENCOUNTER — Inpatient Hospital Stay: Payer: BLUE CROSS/BLUE SHIELD | Attending: Adult Health | Admitting: Oncology

## 2018-04-22 ENCOUNTER — Inpatient Hospital Stay: Payer: BLUE CROSS/BLUE SHIELD

## 2018-04-22 ENCOUNTER — Telehealth: Payer: Self-pay | Admitting: Oncology

## 2018-04-22 ENCOUNTER — Other Ambulatory Visit: Payer: Self-pay | Admitting: Oncology

## 2018-04-22 VITALS — BP 113/74 | HR 60 | Temp 98.2°F | Resp 18

## 2018-04-22 VITALS — BP 105/66 | HR 64 | Temp 98.9°F | Resp 18 | Ht 65.0 in | Wt 150.8 lb

## 2018-04-22 DIAGNOSIS — Z1509 Genetic susceptibility to other malignant neoplasm: Secondary | ICD-10-CM

## 2018-04-22 DIAGNOSIS — C50412 Malignant neoplasm of upper-outer quadrant of left female breast: Secondary | ICD-10-CM | POA: Diagnosis not present

## 2018-04-22 DIAGNOSIS — Z79811 Long term (current) use of aromatase inhibitors: Secondary | ICD-10-CM | POA: Diagnosis not present

## 2018-04-22 DIAGNOSIS — Z923 Personal history of irradiation: Secondary | ICD-10-CM | POA: Diagnosis not present

## 2018-04-22 DIAGNOSIS — Z170 Estrogen receptor positive status [ER+]: Secondary | ICD-10-CM | POA: Diagnosis not present

## 2018-04-22 DIAGNOSIS — Z853 Personal history of malignant neoplasm of breast: Secondary | ICD-10-CM

## 2018-04-22 DIAGNOSIS — Z9221 Personal history of antineoplastic chemotherapy: Secondary | ICD-10-CM | POA: Diagnosis not present

## 2018-04-22 DIAGNOSIS — M85852 Other specified disorders of bone density and structure, left thigh: Secondary | ICD-10-CM | POA: Insufficient documentation

## 2018-04-22 DIAGNOSIS — Z1501 Genetic susceptibility to malignant neoplasm of breast: Secondary | ICD-10-CM | POA: Insufficient documentation

## 2018-04-22 DIAGNOSIS — Z95828 Presence of other vascular implants and grafts: Secondary | ICD-10-CM

## 2018-04-22 LAB — CMP (CANCER CENTER ONLY)
ALBUMIN: 4 g/dL (ref 3.5–5.0)
ALK PHOS: 40 U/L (ref 38–126)
ALT: 11 U/L (ref 0–44)
AST: 16 U/L (ref 15–41)
Anion gap: 6 (ref 5–15)
BUN: 12 mg/dL (ref 8–23)
CALCIUM: 9.7 mg/dL (ref 8.9–10.3)
CO2: 30 mmol/L (ref 22–32)
CREATININE: 0.86 mg/dL (ref 0.44–1.00)
Chloride: 104 mmol/L (ref 98–111)
GFR, Estimated: >60 mL/min (ref >60)
GLUCOSE: 71 mg/dL (ref 70–99)
Potassium: 4.5 mmol/L (ref 3.5–5.1)
SODIUM: 140 mmol/L (ref 135–145)
Total Bilirubin: 0.4 mg/dL (ref 0.3–1.2)
Total Protein: 6.9 g/dL (ref 6.5–8.1)

## 2018-04-22 LAB — CBC WITH DIFFERENTIAL (CANCER CENTER ONLY)
Abs Immature Granulocytes: 0.01 10*3/uL (ref 0.00–0.07)
Basophils Absolute: 0.1 10*3/uL (ref 0.0–0.1)
Basophils Relative: 1 %
EOS ABS: 0.2 10*3/uL (ref 0.0–0.5)
EOS PCT: 5 %
HEMATOCRIT: 38.6 % (ref 36.0–46.0)
HEMOGLOBIN: 12.7 g/dL (ref 12.0–15.0)
Immature Granulocytes: 0 %
LYMPHS PCT: 31 %
Lymphs Abs: 1.5 10*3/uL (ref 0.7–4.0)
MCH: 31.4 pg (ref 26.0–34.0)
MCHC: 32.9 g/dL (ref 30.0–36.0)
MCV: 95.5 fL (ref 80.0–100.0)
MONO ABS: 0.5 10*3/uL (ref 0.1–1.0)
MONOS PCT: 10 %
Neutro Abs: 2.6 10*3/uL (ref 1.7–7.7)
Neutrophils Relative %: 53 %
Platelet Count: 257 10*3/uL (ref 150–400)
RBC: 4.04 MIL/uL (ref 3.87–5.11)
RDW: 12.4 % (ref 11.5–15.5)
WBC Count: 4.9 10*3/uL (ref 4.0–10.5)
nRBC: 0 % (ref 0.0–0.2)

## 2018-04-22 MED ORDER — DENOSUMAB 60 MG/ML ~~LOC~~ SOSY: 60.0000 mg | PREFILLED_SYRINGE | Freq: Once | SUBCUTANEOUS | 0 refills | Status: AC

## 2018-04-22 MED ORDER — ANASTROZOLE 1 MG PO TABS: ORAL_TABLET | ORAL | 4 refills | Status: DC

## 2018-04-22 MED ORDER — DENOSUMAB 60 MG/ML ~~LOC~~ SOSY
60.0000 mg | PREFILLED_SYRINGE | Freq: Once | SUBCUTANEOUS | Status: AC
  Administered 2018-04-22: 60 mg via SUBCUTANEOUS

## 2018-04-22 NOTE — Telephone Encounter (Signed)
Gave patient avs and calendar.   °

## 2018-05-01 ENCOUNTER — Ambulatory Visit (INDEPENDENT_AMBULATORY_CARE_PROVIDER_SITE_OTHER): Payer: BLUE CROSS/BLUE SHIELD | Admitting: Osteopathic Medicine

## 2018-05-01 ENCOUNTER — Telehealth: Payer: Self-pay | Admitting: Osteopathic Medicine

## 2018-05-01 VITALS — BP 113/62 | HR 64 | Temp 98.1°F | Wt 154.7 lb

## 2018-05-01 DIAGNOSIS — Z23 Encounter for immunization: Secondary | ICD-10-CM

## 2018-05-01 NOTE — Telephone Encounter (Signed)
She was already on Shingrix list, updated comment to call in Feb

## 2018-05-01 NOTE — Telephone Encounter (Signed)
Pt checked out and will need to schedule her next shingles vaccine for Feb 2020 when we are able

## 2018-05-01 NOTE — Progress Notes (Signed)
Pt in today for shingrix vaccine. This is 1 of 2 of the shingrix series. Vitals taken and no fever noted. Vaccine was given in right deltoid. Pt tolerated well with no immediate complications.

## 2018-05-08 ENCOUNTER — Encounter: Payer: Self-pay | Admitting: Osteopathic Medicine

## 2018-05-08 ENCOUNTER — Ambulatory Visit (INDEPENDENT_AMBULATORY_CARE_PROVIDER_SITE_OTHER): Payer: BLUE CROSS/BLUE SHIELD | Admitting: Osteopathic Medicine

## 2018-05-08 VITALS — BP 121/72 | HR 75 | Temp 98.3°F | Wt 152.8 lb

## 2018-05-08 DIAGNOSIS — M8580 Other specified disorders of bone density and structure, unspecified site: Secondary | ICD-10-CM

## 2018-05-08 DIAGNOSIS — M26622 Arthralgia of left temporomandibular joint: Secondary | ICD-10-CM | POA: Diagnosis not present

## 2018-05-08 NOTE — Patient Instructions (Signed)

## 2018-05-09 NOTE — Progress Notes (Signed)
HPI: Jamie [name] is a 62 y.o. female who  has a past medical history of Allergy, Anemia, Arthritis, Basal cell carcinoma, Breast cancer ([place]) (06/2016), Colon polyps, Eczema, Genetic testing (08/15/2016), History of radiation therapy (01/02/17-01/31/17), Personal history of chemotherapy, Personal history of radiation therapy, and PONV (postoperative nausea and vomiting).  she presents to [facility] today, 05/09/18,  for chief complaint of:  Jaw pain  Osteopenia questions  Jaw pain on L few days ago, much better now, she was worried might be d/t complications form her Prolia. She has a lot of concerns about this medicine she is taking for osteopenia - recently attended a lecture about strength/resistance training and has questions about some things the lecturer went over re: medications (was not a physician or other medical provider)      At today's visit... Past medical history, surgical history, and family history reviewed and updated as needed.  Current medication list and allergy/intolerance information reviewed and updated as needed. (See remainder of HPI, ROS, Phys Exam below)          ASSESSMENT/PLAN: The primary encounter diagnosis was Osteopenia, unspecified location. A diagnosis of Arthralgia of left temporomandibular joint was also pertinent to this visit.   Pt has a lot of detailed questions, I answered best I could but may be better served by speaking with a specialist.   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   Orders Placed This Encounter  Procedures  . Ambulatory referral to Endocrinology       Patient Instructions   Temporomandibular Joint Syndrome  Temporomandibular joint syndrome (TMJ syndrome) is a condition that causes pain in the temporomandibular joints. These joints are located near your ears and allow your jaw to open and close. For people with TMJ syndrome, chewing,  biting, or other movements of the jaw can be difficult or painful. TMJ syndrome is often mild and goes away within a few weeks. However, sometimes the condition becomes a long-term (chronic) problem. What are the causes? This condition may be caused by:  Grinding your teeth or clenching your jaw. Some people do this when they are under stress.  Arthritis.  Injury to the jaw.  Head or neck injury.  Teeth or dentures that are not aligned well. In some cases, the cause of TMJ syndrome may not be known. What are the signs or symptoms? The most common symptom of this condition is an aching pain on the side of the head in the area of the TMJ. Other symptoms may include:  Pain when moving your jaw, such as when chewing or biting.  Being unable to open your jaw all the way.  Making a clicking sound when you open your mouth.  Headache.  Earache.  Neck or shoulder pain. How is this diagnosed? This condition may be diagnosed based on:  Your symptoms and medical history.  A physical exam. Your health care provider may check the range of motion of your jaw.  Imaging tests, such as X-rays or an MRI. You may also need to see your dentist, who will determine if your teeth and jaw are lined up correctly. How is this treated? TMJ syndrome often goes away on its own. If treatment is needed, the options may include:  Eating soft foods and applying ice or heat.  Medicines to relieve pain or inflammation.  Medicines or massage to relax the muscles.  A splint, bite plate, or mouthpiece  to prevent teeth grinding or jaw clenching.  Relaxation techniques or counseling to help reduce stress.  A therapy for pain in which an electrical current is applied to the nerves through the skin (transcutaneous electrical nerve stimulation).  Acupuncture. This is sometimes helpful to relieve pain.  Jaw surgery. This is rarely needed. Follow these instructions at home:  Eating and drinking  Eat a  soft diet if you are having trouble chewing.  Avoid foods that require a lot of chewing. Do not chew gum. General instructions  Take over-the-counter and prescription medicines only as told by your health care provider.  If directed, put ice on the painful area. ? Put ice in a plastic bag. ? Place a towel between your skin and the bag. ? Leave the ice on for 20 minutes, 2-3 times a day.  Apply a warm, wet cloth (warm compress) to the painful area as directed.  Massage your jaw area and do any jaw stretching exercises as told by your health care provider.  If you were given a splint, bite plate, or mouthpiece, wear it as told by your health care provider.  Keep all follow-up visits as told by your health care provider. This is important. Contact a health care provider if:  You are having trouble eating.  You have new or worsening symptoms. Get help right away if:  Your jaw locks open or closed. Summary  Temporomandibular joint syndrome (TMJ syndrome) is a condition that causes pain in the temporomandibular joints. These joints are located near your ears and allow your jaw to open and close.  TMJ syndrome is often mild and goes away within a few weeks. However, sometimes the condition becomes a long-term (chronic) problem.  Symptoms include an aching pain on the side of the head in the area of the TMJ, pain when chewing or biting, and being unable to open your jaw all the way. You may also make a clicking sound when you open your mouth.  TMJ syndrome often goes away on its own. If treatment is needed, it may include medicines to relieve pain, reduce inflammation, or relax the muscles. A splint, bite plate, or mouthpiece may also be used to prevent teeth grinding or jaw clenching. This information is not intended to replace advice given to you by your health care provider. Make sure you discuss any questions you have with your health care provider. Document Released: 01/30/2001  Document Revised: 06/18/2017 Document Reviewed: 06/18/2017 Elsevier Interactive Patient Education  2019 [employer].      Follow-up plan: Return for annual physical when due, sooner if needed.                             ############################################ ############################################ ############################################ ############################################    Current Meds  Medication Sig  . anastrozole (ARIMIDEX) 1 MG tablet TAKE 1 TABLET(1 MG) BY MOUTH DAILY  . BIOTIN PO Take 1 tablet by mouth daily.  . calcium-vitamin D (OSCAL WITH D) 500-200 MG-UNIT tablet Take 1 tablet by mouth 2 (two) times daily.  . cetirizine (ZYRTEC) 10 MG tablet Take 10 mg by mouth daily as needed (for allergies--Spring/Summer).   . Cholecalciferol (VITAMIN D3) 1000 units CAPS Take 1,000 Units by mouth daily.   . Cyanocobalamin (B-12) 2500 MCG TABS Place 2,500 mcg under the tongue daily.   [name] denosumab (PROLIA) 60 MG/ML SOSY injection Inject 60 mg into the skin every 6 (six) months.  . diphenhydrAMINE (BENADRYL) 25 MG  tablet Take 12.5 mg by mouth at bedtime as needed for sleep.  . Multiple Vitamin (MULTIVITAMIN) tablet Take 1 tablet by mouth daily.    Allergies  Allergen Reactions  . Adhesive [Tape] Rash  . Codeine Nausea Only and Other (See Comments)    Dizzy  . Sulfamethoxazole Nausea And Vomiting       Review of Systems:  Constitutional: No recent illness  Cardiac: No  chest pain, No  pressure, No palpitations  Respiratory:  No  shortness of breath. No  Cough  Gastrointestinal: No  abdominal pain, no change on bowel habits  Musculoskeletal: +new myalgia/arthralgia  Psychiatric: No  concerns with depression, +concerns with anxiety  Exam:  BP 121/72 (BP Location: Left Arm, Patient Position: Sitting, Cuff Size: Normal)   Pulse 75   Temp 98.3 F (36.8 C) (Oral)   Wt 152 lb 12.8 oz (69.3 kg)   BMI 25.43 kg/m    Constitutional: VS see above. General Appearance: alert, well-developed, well-nourished, NAD  Eyes: Normal lids and conjunctive, non-icteric sclera  Ears, Nose, Mouth, Throat: MMM, Normal external inspection ears/nares/mouth/lips/gums.  Neck: No masses, trachea midline.   Respiratory: Normal respiratory effort. no wheeze, no rhonchi, no rales  Cardiovascular: S1/S2 normal, no murmur, no rub/gallop auscultated. RRR.   Musculoskeletal: Gait normal. Symmetric and independent movement of all extremities, TMJ on L no swelling or popping, a bit tender with jaw movement   Neurological: Normal balance/coordination. No tremor.  Skin: warm, dry, intact.   Psychiatric: Normal judgment/insight. Normal mood and affect. Oriented x3.       Visit summary with medication list and pertinent instructions was printed for patient to review, patient was advised to alert [place] if any updates are needed. All questions at time of visit were answered - patient instructed to contact office with any additional concerns. ER/RTC precautions were reviewed with the patient and understanding verbalized.   Note: Total time spent 25 minutes, greater than 50% of the visit was spent face-to-face counseling and coordinating care for the following: The primary encounter diagnosis was Osteopenia, unspecified location. A diagnosis of Arthralgia of left temporomandibular joint was also pertinent to this visit.[name]  Please note: voice recognition software was used to produce this document, and typos may escape review. Please contact Dr. Sheppard Coil for any needed clarifications.    Follow up plan: Return for annual physical when due, sooner if needed.

## 2018-05-16 DIAGNOSIS — Z08 Encounter for follow-up examination after completed treatment for malignant neoplasm: Secondary | ICD-10-CM | POA: Diagnosis not present

## 2018-05-16 DIAGNOSIS — Z85828 Personal history of other malignant neoplasm of skin: Secondary | ICD-10-CM | POA: Diagnosis not present

## 2018-05-16 DIAGNOSIS — L570 Actinic keratosis: Secondary | ICD-10-CM | POA: Diagnosis not present

## 2018-05-16 DIAGNOSIS — L853 Xerosis cutis: Secondary | ICD-10-CM | POA: Diagnosis not present

## 2018-05-16 DIAGNOSIS — C44719 Basal cell carcinoma of skin of left lower limb, including hip: Secondary | ICD-10-CM | POA: Diagnosis not present

## 2018-05-16 DIAGNOSIS — D485 Neoplasm of uncertain behavior of skin: Secondary | ICD-10-CM | POA: Diagnosis not present

## 2018-06-12 DIAGNOSIS — Z8262 Family history of osteoporosis: Secondary | ICD-10-CM | POA: Insufficient documentation

## 2018-06-12 DIAGNOSIS — Z853 Personal history of malignant neoplasm of breast: Secondary | ICD-10-CM | POA: Insufficient documentation

## 2018-06-12 DIAGNOSIS — Z79899 Other long term (current) drug therapy: Secondary | ICD-10-CM | POA: Diagnosis not present

## 2018-06-12 DIAGNOSIS — M8589 Other specified disorders of bone density and structure, multiple sites: Secondary | ICD-10-CM | POA: Diagnosis not present

## 2018-06-20 DIAGNOSIS — L821 Other seborrheic keratosis: Secondary | ICD-10-CM | POA: Diagnosis not present

## 2018-06-20 DIAGNOSIS — C44719 Basal cell carcinoma of skin of left lower limb, including hip: Secondary | ICD-10-CM | POA: Diagnosis not present

## 2018-07-07 ENCOUNTER — Ambulatory Visit
Admission: RE | Admit: 2018-07-07 | Discharge: 2018-07-07 | Disposition: A | Discharge status: Home / Self Care | Payer: BLUE CROSS/BLUE SHIELD | Source: Ambulatory Visit | Attending: Oncology | Admitting: Oncology

## 2018-07-07 DIAGNOSIS — Z853 Personal history of malignant neoplasm of breast: Secondary | ICD-10-CM

## 2018-07-07 DIAGNOSIS — R922 Inconclusive mammogram: Secondary | ICD-10-CM | POA: Diagnosis not present

## 2018-07-10 ENCOUNTER — Ambulatory Visit: Payer: BLUE CROSS/BLUE SHIELD

## 2018-07-11 ENCOUNTER — Ambulatory Visit (INDEPENDENT_AMBULATORY_CARE_PROVIDER_SITE_OTHER): Payer: BLUE CROSS/BLUE SHIELD | Admitting: Physician Assistant

## 2018-07-11 VITALS — BP 118/64 | HR 73 | Temp 98.0°F

## 2018-07-11 DIAGNOSIS — Z23 Encounter for immunization: Secondary | ICD-10-CM | POA: Diagnosis not present

## 2018-07-11 NOTE — Progress Notes (Signed)
Pt came into clinic today for second shingrix. Reports no negative side effects from first injection.  Vitals:   07/11/18 1403  BP: 118/64  Pulse: 73  Temp: 98 F (36.7 C)    Tolerated injection today in right deltoid well, no immediate complications. Advised to contact clinic with any questions or concerns.

## 2018-08-17 ENCOUNTER — Telehealth: Payer: BLUE CROSS/BLUE SHIELD | Admitting: Nurse Practitioner

## 2018-08-17 DIAGNOSIS — Z20822 Contact with and (suspected) exposure to covid-19: Secondary | ICD-10-CM

## 2018-08-17 DIAGNOSIS — R6889 Other general symptoms and signs: Principal | ICD-10-CM

## 2018-08-17 NOTE — Progress Notes (Signed)
E-Visit for Corona Virus Screening  Based on your current symptoms, it seems unlikely that your symptoms are related to the Coronavirus.   Coronavirus disease 2019 (COVID-19) is a respiratory illness that can spread from person to person. The virus that causes COVID-19 is a new virus that was first identified in the country of [place] but is now found in multiple other countries and has spread to the [place].  Symptoms associated with the virus are mild to severe fever, cough, and shortness of breath. There is currently no vaccine to protect against COVID-19, and there is no specific antiviral treatment for the virus.   To be considered HIGH RISK for Coronavirus (COVID-19), you have to meet the following criteria:  . Traveled to [place], [place], [place], [place] or [place]; or in the [place] to [place], [place], [place], or [place]; and have fever, cough, and shortness of breath within the last 2 weeks of travel OR  . Been in close contact with a person diagnosed with COVID-19 within the last 2 weeks and have fever, cough, and shortness of breath  . IF YOU DO NOT MEET THESE CRITERIA, YOU ARE CONSIDERED LOW RISK FOR COVID-19.   It is vitally important that if you feel that you have an infection such as this virus or any other virus that you stay home and away from places where you may spread it to others.  You should self-quarantine for 14 days if you have symptoms that could potentially be coronavirus and avoid contact with people age 88 and older.   You can use medication such as delsym or mucinex for cough OTC  You may also take acetaminophen (Tylenol) as needed for fever.   Reduce your risk of any infection by using the same precautions used for avoiding the common cold or flu:  [name] Wash your hands often with soap and warm water for at least 20 seconds.  If soap and water are not readily available, use an alcohol-based hand sanitizer with at least 60% alcohol.  . If coughing or  sneezing, cover your mouth and nose by coughing or sneezing into the elbow areas of your shirt or coat, into a tissue or into your sleeve (not your hands). . Avoid shaking hands with others and consider head nods or verbal greetings only. . Avoid touching your eyes, nose, or mouth with unwashed hands.  . Avoid close contact with people who are sick. . Avoid places or events with large numbers of people in one location, like concerts or sporting events. . Carefully consider travel plans you have or are making. . If you are planning any travel outside or inside the [place], visit the CDC's Travelers' Health webpage for the latest health notices. . If you have some symptoms but not all symptoms, continue to monitor at home and seek medical attention if your symptoms worsen. . If you are having a medical emergency, call 911.  HOME CARE . Only take medications as instructed by your medical team. . Drink plenty of fluids and get plenty of rest. . A steam or ultrasonic humidifier can help if you have congestion.   GET HELP RIGHT AWAY IF: . You develop worsening fever. . You become short of breath . You cough up blood. . Your symptoms become more severe MAKE SURE YOU   Understand these instructions.  Will watch your condition.  Will get help right away if you are not doing well or get worse.  Your e-visit answers  were reviewed by a board certified advanced clinical practitioner to complete your personal care plan.  Depending on the condition, your plan could have included both over the counter or prescription medications.  If there is a problem please reply once you have received a response from your provider. Your safety is important to [place].  If you have drug allergies check your prescription carefully.    You can use MyChart to ask questions about today's visit, request a non-urgent call back, or ask for a work or school excuse for 24 hours related to this e-Visit. If it has been greater than 24  hours you will need to follow up with your provider, or enter a new e-Visit to address those concerns. You will get an e-mail in the next two days asking about your experience.  I hope that your e-visit has been valuable and will speed your recovery. Thank you for using e-visits.    5 minutes spent reviewing and documenting in chart.

## 2018-08-27 ENCOUNTER — Telehealth: Payer: Self-pay | Admitting: Oncology

## 2018-08-27 NOTE — Telephone Encounter (Signed)
Returned call to patient re rescheduling 6/18 appointments. Per patient cancel injection she does not want to do it and move lab/fu to 8/25. Confirmed new date/time with patient. Message to provider.

## 2018-09-26 DIAGNOSIS — Z1159 Encounter for screening for other viral diseases: Secondary | ICD-10-CM | POA: Diagnosis not present

## 2018-11-06 ENCOUNTER — Ambulatory Visit: Payer: BLUE CROSS/BLUE SHIELD | Admitting: Oncology

## 2018-11-06 ENCOUNTER — Ambulatory Visit: Payer: BLUE CROSS/BLUE SHIELD

## 2018-11-06 ENCOUNTER — Other Ambulatory Visit: Payer: BLUE CROSS/BLUE SHIELD

## 2018-12-31 DIAGNOSIS — Z08 Encounter for follow-up examination after completed treatment for malignant neoplasm: Secondary | ICD-10-CM | POA: Diagnosis not present

## 2018-12-31 DIAGNOSIS — L814 Other melanin hyperpigmentation: Secondary | ICD-10-CM | POA: Diagnosis not present

## 2018-12-31 DIAGNOSIS — L821 Other seborrheic keratosis: Secondary | ICD-10-CM | POA: Diagnosis not present

## 2018-12-31 DIAGNOSIS — Z85828 Personal history of other malignant neoplasm of skin: Secondary | ICD-10-CM | POA: Diagnosis not present

## 2019-01-12 ENCOUNTER — Other Ambulatory Visit: Payer: Self-pay | Admitting: *Deleted

## 2019-01-12 DIAGNOSIS — C50412 Malignant neoplasm of upper-outer quadrant of left female breast: Secondary | ICD-10-CM

## 2019-01-12 NOTE — Progress Notes (Signed)
[place]  [phone]) [phone] Fax:(336) [phone]     ID: Jamie [name] DOB: January 12, 1956  MR#: [id number]  BFX#:[id number]  Patient Care Team: Jamie Reeve, DO as PCP - General (Osteopathic Medicine) Jamie Klein, MD as Consulting Physician (General Surgery) [name], [name], MD as Consulting Physician (Oncology) Jamie Pray, MD as Consulting Physician (Radiation Oncology) Jamie Argue, MD as Referring Physician Armbruster, Carlota Raspberry, MD as Consulting Physician (Gastroenterology) Jamie [name], Jamie Massed, NP as Nurse Practitioner (Hematology and Oncology) OTHER MD:  CHIEF COMPLAINT: 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 This showed a possible mass in the left breast. On 07/02/2016 she underwent left diagnostic mammography with tomography and left breast ultrasonography. The breast density was category C. In the left breast upper outer quadrant there was a microlobulated mass which was not directly palpable, although there was slight thickening in the left breast 1:00 position. Targeted ultrasonography confirmed a solid hypoechoic microlobulated mass in the left breast 1:00 radiant 4 cm from the nipple, measuring 0.8 cm.  On 07/03/2016 Jamie [name] underwent biopsy of the left breast mass in question, and this showed ([id number]) invasive ductal carcinoma, grade 2 or 3, with extracellular mucin, estrogen receptor 5% positive with weak staining intensity, progesterone receptor negative, with an MIB-1 of 50%, and HER-2 amplified, the signals ratio being 5.71 and the number per cell 14.55.  Her subsequent history is as detailed below  INTERVAL HISTORY: Jamie [name] returns today for follow-up and treatment of her estrogen receptor positive with HER-2  amplification. She was last seen here on 04/22/2018.   She continues on anastrozole.  She tolerates this with no significant side effects that she is aware of  She received her last Prolia dose 04/22/2018 and were going to wait to see what her next bone density shows before continuing.  Jamie [name]'s last bone density screening on 02/26/2017, showed a T-score of -2.0, which is considered osteopenic.    Since her last visit here, she underwent a digital diagnostic bilateral mammogram with tomography on 07/07/2018 showing: Breast Density Category C. There is no mammographic evidence for malignancy.    REVIEW OF SYSTEMS: Jamie [name] exercises every morning, doing yoga, Zumba, weights, walking, and pretty much keeps her self in shape.  She and her husband are appropriately concerned and taking precautions regarding the current pandemic.  Aside from these issues a detailed review of systems today was stable  PAST MEDICAL HISTORY: Past Medical History:  Diagnosis Date  . Allergy   . Anemia   . Arthritis    feet  . Basal cell carcinoma   . Breast cancer ([place]) 06/2016   left breast  . Colon polyps   . Eczema   . Genetic testing 08/15/2016   Ms. Stoutenburg underwent genetic testing for hereditary cancer syndrome through [facility]'s 43-gene Common Hereditary Cancers Panel. Ms. 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 Result report is dated 08/15/2016. Please see genetic counseling documentation from 08/17/2016 for further discussion.  [name] History of radiation therapy 01/02/17-01/31/17   left breast was treated to 42.72 Gy in 16 fractions, left breast boost 10 Gy in 5 fractions  . Personal history of chemotherapy   . Personal history of radiation therapy   . PONV (postoperative nausea and vomiting)     PAST SURGICAL HISTORY: Past  Surgical History:  Procedure Laterality Date  . BONE SPUR Bilateral 2001 AND 1988  . BREAST BIOPSY    .  BREAST LUMPECTOMY Left    07/2016  . BREAST LUMPECTOMY WITH RADIOACTIVE SEED AND SENTINEL LYMPH NODE BIOPSY Left 07/26/2016   Procedure: BREAST LUMPECTOMY WITH RADIOACTIVE SEED AND SENTINEL LYMPH NODE BIOPSY;  Surgeon: Jamie Klein, MD;  Location: [facility];  Service: General;  Laterality: Left;  . BUNIONECTOMY Bilateral 02/2012  . COLONOSCOPY W/ POLYPECTOMY  08/2007  . DILATION AND CURETTAGE OF UTERUS  2002  . MOHS SURGERY  2010  . PORT-A-CATH REMOVAL N/A 07/18/2017   Procedure: REMOVAL PORT-A-CATH;  Surgeon: Jamie Klein, MD;  Location: [facility];  Service: General;  Laterality: N/A;  . PORTACATH PLACEMENT Right 07/26/2016   Procedure: INSERTION PORT-A-CATH;  Surgeon: Jamie Klein, MD;  Location: [facility];  Service: General;  Laterality: Right;    FAMILY HISTORY Family History  Problem Relation Age of Onset  . Ovarian cancer Mother 31  . Hypertension Father   . Stroke Father   . Heart disease Father   . Breast cancer Maternal Aunt 77       recurred at 62  . Heart disease Paternal Grandmother   . Osteoporosis Sister   . Liver cancer Maternal Uncle 56  The patient's father died at age 32, with some form of skin cancer, most likely melanoma. The patient's mother died at the age of 48 with ovarian cancer, which had been diagnosed a few months prior. The patient had no brothers, 1 sister. A maternal aunt was diagnosed with breast cancer at age 23, recurrent age 77.  GYNECOLOGIC HISTORY:  No LMP recorded. Patient is postmenopausal. Menarche age 10, the patient is GX P0. She stopped having periods approximately age 24. 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 Her husband Jamie [name] worked as a Dance movement psychotherapist for a [employer]. Their last name is pronounced foh-TEE-ah and they tell me it means "burning" in [place]. Their children are adopted. Jamie [name] lives in [place] and works in Press photographer,  and Jamie [name] lives in [place] and is an Scientist, water quality. The patient has 3 grandchildren. She is not a Ambulance person.    ADVANCED DIRECTIVES: In place   HEALTH MAINTENANCE: Social History   Tobacco Use  . Smoking status: Never Smoker  . Smokeless tobacco: Never Used  Substance Use Topics  . Alcohol use: Yes    Alcohol/week: 0.0 standard drinks    Comment: 1-2  . Drug use: No     Colonoscopy:2009  QBH:ALPFXT 2018  Bone density: 02/26/2017 T-score of -2.0 at the left femur neck    Allergies  Allergen Reactions  . Adhesive [Tape] Rash  . Codeine Nausea Only and Other (See Comments)    Dizzy  . Sulfamethoxazole Nausea And Vomiting    Current Outpatient Medications  Medication Sig Dispense Refill  . anastrozole (ARIMIDEX) 1 MG tablet TAKE 1 TABLET(1 MG) BY MOUTH DAILY 90 tablet 4  . BIOTIN PO Take 1 tablet by mouth daily.    . calcium-vitamin D (OSCAL WITH D) 500-200 MG-UNIT tablet Take 1 tablet by mouth 2 (two) times daily.    . cetirizine (ZYRTEC) 10 MG tablet Take 10 mg by mouth daily as needed (for allergies--Spring/Summer).     . Cholecalciferol (VITAMIN D3) 1000 units CAPS Take 1,000 Units by mouth daily.     . Cyanocobalamin (B-12)  2500 MCG TABS Place 2,500 mcg under the tongue daily.     [name] denosumab (PROLIA) 60 MG/ML SOSY injection Inject 60 mg into the skin every 6 (six) months.    . diphenhydrAMINE (BENADRYL) 25 MG tablet Take 12.5 mg by mouth at bedtime as needed for sleep.    . Multiple Vitamin (MULTIVITAMIN) tablet Take 1 tablet by mouth daily.     No current facility-administered medications for this visit.     OBJECTIVE: Middle-aged white [name] in no acute distress  Vitals:   01/13/19 1455  BP: 117/70  Pulse: 75  Resp: 17  Temp: 98.9 F (37.2 C)  SpO2: 100%    Body mass index is 25.7 kg/m.   Filed Weights   01/13/19 1455  Weight: 154 lb 7 oz (70.1 kg)     ECOG FS:0 - Asymptomatic  Sclerae unicteric, EOMs intact Wearing a mask No cervical or  supraclavicular adenopathy Lungs no rales or rhonchi Heart regular rate and rhythm Abd soft, nontender, positive bowel sounds MSK no focal spinal tenderness, no upper extremity lymphedema Neuro: nonfocal, well oriented, appropriate affect Breasts: The right breast is unremarkable.  The left breast is status post lumpectomy and radiation with a good cosmetic result.  The nipple is minimally inverted.  A detailed review of systems today was otherwise stable  LAB RESULTS:  CMP     Component Value Date/Time   NA 141 01/13/2019 1414   NA 140 05/15/2017 0920   K 4.5 01/13/2019 1414   K 4.3 05/15/2017 0920   CL 103 01/13/2019 1414   CO2 29 01/13/2019 1414   CO2 25 05/15/2017 0920   GLUCOSE 106 (H) 01/13/2019 1414   GLUCOSE 94 05/15/2017 0920   BUN 14 01/13/2019 1414   BUN 11.5 05/15/2017 0920   CREATININE 0.98 01/13/2019 1414   CREATININE 0.77 03/25/2018 0922   CREATININE 0.8 05/15/2017 0920   CALCIUM 9.6 01/13/2019 1414   CALCIUM 9.3 05/15/2017 0920   PROT 7.2 01/13/2019 1414   PROT 6.5 05/15/2017 0920   ALBUMIN 4.1 01/13/2019 1414   ALBUMIN 3.8 05/15/2017 0920   AST 17 01/13/2019 1414   AST 17 05/15/2017 0920   ALT 12 01/13/2019 1414   ALT 10 05/15/2017 0920   ALKPHOS 51 01/13/2019 1414   ALKPHOS 53 05/15/2017 0920   BILITOT 0.3 01/13/2019 1414   BILITOT 0.36 05/15/2017 0920   GFRNONAA >60 01/13/2019 1414   GFRNONAA 82 03/25/2018 0922   GFRAA >60 01/13/2019 1414   GFRAA 95 03/25/2018 0922    INo results found for: SPEP, UPEP  Lab Results  Component Value Date   WBC 10.0 01/13/2019   NEUTROABS 6.6 01/13/2019   HGB 13.2 01/13/2019   HCT 39.6 01/13/2019   MCV 95.2 01/13/2019   PLT 286 01/13/2019      Chemistry      Component Value Date/Time   NA 141 01/13/2019 1414   NA 140 05/15/2017 0920   K 4.5 01/13/2019 1414   K 4.3 05/15/2017 0920   CL 103 01/13/2019 1414   CO2 29 01/13/2019 1414   CO2 25 05/15/2017 0920   BUN 14 01/13/2019 1414   BUN 11.5  05/15/2017 0920   CREATININE 0.98 01/13/2019 1414   CREATININE 0.77 03/25/2018 0922   CREATININE 0.8 05/15/2017 0920   GLU 73 08/02/2014      Component Value Date/Time   CALCIUM 9.6 01/13/2019 1414   CALCIUM 9.3 05/15/2017 0920   ALKPHOS 51 01/13/2019 1414   ALKPHOS 53  05/15/2017 0920   AST 17 01/13/2019 1414   AST 17 05/15/2017 0920   ALT 12 01/13/2019 1414   ALT 10 05/15/2017 0920   BILITOT 0.3 01/13/2019 1414   BILITOT 0.36 05/15/2017 0920       No results found for: LABCA2  No components found for: LABCA125  No results for input(s): INR in the last 168 hours.  Urinalysis    Component Value Date/Time   COLORURINE COLORLESS (A) 12/22/2016 2105   APPEARANCEUR CLEAR 12/22/2016 2105   LABSPEC 1.005 12/22/2016 2105   PHURINE 7.0 12/22/2016 2105   GLUCOSEU NEGATIVE 12/22/2016 2105   HGBUR NEGATIVE 12/22/2016 2105   BILIRUBINUR negative 08/16/2017 0855   KETONESUR NEGATIVE 12/22/2016 2105   PROTEINUR negative 08/16/2017 0855   PROTEINUR NEGATIVE 12/22/2016 2105   UROBILINOGEN 0.2 08/16/2017 0855   NITRITE negative 08/16/2017 0855   NITRITE NEGATIVE 12/22/2016 2105   LEUKOCYTESUR Negative 08/16/2017 0855     STUDIES: No results found.  ELIGIBLE FOR AVAILABLE RESEARCH PROTOCOL: no  ASSESSMENT: 63 y.o. Jamie [name], Jamie [name] status post biopsy of the left breast upper outer quadrant lesion 07/03/2016 showing a clinical T1b N0, stage 1B invasive ductal carcinoma, grade 2 or 3, estrogen receptor weakly positive at 5%, progesterone receptor negative, but HER-2 strongly amplified, with an MIB-1 of 50%.  (1) status post left lumpectomy with sentinel lymph node sampling 07/26/2016 for a pT1c pN0, stage IA invasive ductal carcinoma, grade 3, with extracellular mucin, and negative margins  (2) chemotherapy consisting of Paclitaxel weekly starting 09/18/2016, discontinued after one cycle due to peripheral neuropathy.   (a) Gemcitabine and Carboplatin started 10/02/2016,  repeated days 1 and 8 of each 21 day cycle to a total of 8 doses (4 cycles)-- final dose 12/18/2016  (3) trastuzumab started 08/07/2016, completed 12 months on 07/17/2017  (a) baseline echocardiogram 07/25/2016 shows an ejection fraction of 60-65%  (b) repeat echocardiogram 10/22/2016 shows stable ejection fraction  (c) echocardiogram January 24, 2017 found an ejection fraction in the 55-60% range.  (d) echocardiogram 05/02/2017 showed an ejection fraction of 55-60%  (e) final echocardiogram on 08/06/2017 showed an ejection fraction in the 55-60% range  (4) adjuvant radiation 01/02/2017-01/31/2017:  Left breast was treated to 42.72 Gy in 16 fractions and boosted to 10 Gy in 5 fractions.  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   (a) associated with increased colorectal cancer risk, possibly breast cancer (at least in [place])  (b) colonoscopy 09/11/2007/ Ezzie Dural  (c) colonoscopy 04/17/2017/ Armbruster-- next due 2023   PLAN: Jamie [name] is now 2-1/2 years out from definitive surgery for breast cancer with no evidence of disease recurrence.  This is very favorable.  She is tolerating anastrozole well and the plan will be to continue that a minimum of 5 years.  She will be due for repeat bone density when she has her next mammogram February 2021.  She will go on Medicare next August.  She wants me to see her after that to her next visit here will be in September 2021.  We did discuss the vaccine study at [facility] and she may  be interested in participating   She knows to call for any other issue that may develop before her next visit.  [name], [name], MD  01/13/19 3:10 PM Medical Oncology and Hematology [facility] [address] [place], [place] [address] Tel. [phone]    Fax. [phone]  I, Jacqualyn Posey am acting as a Education administrator for [name], MD.   I, Lurline Del MD, have reviewed the above documentation for accuracy and completeness, and I agree with the above.

## 2019-01-13 ENCOUNTER — Other Ambulatory Visit: Payer: Self-pay

## 2019-01-13 ENCOUNTER — Inpatient Hospital Stay (HOSPITAL_BASED_OUTPATIENT_CLINIC_OR_DEPARTMENT_OTHER): Payer: BLUE CROSS/BLUE SHIELD | Admitting: Oncology

## 2019-01-13 ENCOUNTER — Inpatient Hospital Stay: Payer: BLUE CROSS/BLUE SHIELD | Attending: Oncology

## 2019-01-13 VITALS — BP 117/70 | HR 75 | Temp 98.9°F | Resp 17 | Ht 65.0 in | Wt 154.4 lb

## 2019-01-13 DIAGNOSIS — Z170 Estrogen receptor positive status [ER+]: Secondary | ICD-10-CM | POA: Diagnosis not present

## 2019-01-13 DIAGNOSIS — Z79899 Other long term (current) drug therapy: Secondary | ICD-10-CM | POA: Insufficient documentation

## 2019-01-13 DIAGNOSIS — Z803 Family history of malignant neoplasm of breast: Secondary | ICD-10-CM | POA: Insufficient documentation

## 2019-01-13 DIAGNOSIS — Z85828 Personal history of other malignant neoplasm of skin: Secondary | ICD-10-CM | POA: Diagnosis not present

## 2019-01-13 DIAGNOSIS — Z9221 Personal history of antineoplastic chemotherapy: Secondary | ICD-10-CM | POA: Insufficient documentation

## 2019-01-13 DIAGNOSIS — Z79811 Long term (current) use of aromatase inhibitors: Secondary | ICD-10-CM | POA: Insufficient documentation

## 2019-01-13 DIAGNOSIS — C50412 Malignant neoplasm of upper-outer quadrant of left female breast: Secondary | ICD-10-CM

## 2019-01-13 DIAGNOSIS — Z923 Personal history of irradiation: Secondary | ICD-10-CM | POA: Diagnosis not present

## 2019-01-13 LAB — CMP (CANCER CENTER ONLY)
ALT: 12 U/L (ref 0–44)
AST: 17 U/L (ref 15–41)
Albumin: 4.1 g/dL (ref 3.5–5.0)
Alkaline Phosphatase: 51 U/L (ref 38–126)
Anion gap: 9 (ref 5–15)
BUN: 14 mg/dL (ref 8–23)
CO2: 29 mmol/L (ref 22–32)
Calcium: 9.6 mg/dL (ref 8.9–10.3)
Chloride: 103 mmol/L (ref 98–111)
Creatinine: 0.98 mg/dL (ref 0.44–1.00)
GFR, Est AFR Am: >60 mL/min (ref >60)
GFR, Estimated: >60 mL/min (ref >60)
Glucose, Bld: 106 mg/dL — ABNORMAL HIGH (ref 70–99)
Potassium: 4.5 mmol/L (ref 3.5–5.1)
Sodium: 141 mmol/L (ref 135–145)
Total Bilirubin: 0.3 mg/dL (ref 0.3–1.2)
Total Protein: 7.2 g/dL (ref 6.5–8.1)

## 2019-01-13 LAB — CBC WITH DIFFERENTIAL (CANCER CENTER ONLY)
Abs Immature Granulocytes: 0.03 10*3/uL (ref 0.00–0.07)
Basophils Absolute: 0.1 10*3/uL (ref 0.0–0.1)
Basophils Relative: 1 %
Eosinophils Absolute: 0.3 10*3/uL (ref 0.0–0.5)
Eosinophils Relative: 3 %
HCT: 39.6 % (ref 36.0–46.0)
Hemoglobin: 13.2 g/dL (ref 12.0–15.0)
Immature Granulocytes: 0 %
Lymphocytes Relative: 23 %
Lymphs Abs: 2.3 10*3/uL (ref 0.7–4.0)
MCH: 31.7 pg (ref 26.0–34.0)
MCHC: 33.3 g/dL (ref 30.0–36.0)
MCV: 95.2 fL (ref 80.0–100.0)
Monocytes Absolute: 0.8 10*3/uL (ref 0.1–1.0)
Monocytes Relative: 8 %
Neutro Abs: 6.6 10*3/uL (ref 1.7–7.7)
Neutrophils Relative %: 65 %
Platelet Count: 286 10*3/uL (ref 150–400)
RBC: 4.16 MIL/uL (ref 3.87–5.11)
RDW: 12 % (ref 11.5–15.5)
WBC Count: 10 10*3/uL (ref 4.0–10.5)
nRBC: 0 % (ref 0.0–0.2)

## 2019-01-14 ENCOUNTER — Telehealth: Payer: Self-pay | Admitting: Oncology

## 2019-01-14 NOTE — Telephone Encounter (Signed)
Scheduled per 08/25 los, patient has been called and notified.  

## 2019-01-22 ENCOUNTER — Telehealth: Payer: Self-pay

## 2019-01-22 NOTE — Telephone Encounter (Signed)
Pt called requesting to make an appt to get Flu vaccine. Pt is due for an annual physical with provider. Pls contact pt for scheduling. Thanks.

## 2019-01-22 NOTE — Telephone Encounter (Signed)
Left patient a voicemail with information below. Let patient know to call us back to schedule an appointment in the office or a virtual visit.  °

## 2019-02-10 ENCOUNTER — Ambulatory Visit (INDEPENDENT_AMBULATORY_CARE_PROVIDER_SITE_OTHER): Payer: BLUE CROSS/BLUE SHIELD | Admitting: Physician Assistant

## 2019-02-10 ENCOUNTER — Other Ambulatory Visit: Payer: Self-pay

## 2019-02-10 DIAGNOSIS — Z23 Encounter for immunization: Secondary | ICD-10-CM | POA: Diagnosis not present

## 2019-04-14 ENCOUNTER — Other Ambulatory Visit: Payer: Self-pay | Admitting: Oncology

## 2019-04-14 DIAGNOSIS — Z9889 Other specified postprocedural states: Secondary | ICD-10-CM

## 2019-04-21 ENCOUNTER — Other Ambulatory Visit: Payer: BLUE CROSS/BLUE SHIELD

## 2019-05-01 ENCOUNTER — Telehealth: Payer: Self-pay

## 2019-05-01 ENCOUNTER — Encounter: Payer: Self-pay | Admitting: Oncology

## 2019-05-01 ENCOUNTER — Encounter: Payer: Self-pay | Admitting: Osteopathic Medicine

## 2019-05-01 NOTE — Telephone Encounter (Signed)
Responded directly to patient via MyChart

## 2019-05-01 NOTE — Telephone Encounter (Signed)
Pt left a vm msg with concerns regarding Covid. As per pt, she will be having 4 family members come over to her home over the holidays (son/daugher in law/granddaughters). She stated that precautions will be taken. Everyone will be wearing masks and maintain a safe distance from eachother. Pt is having some concerns because her spouse has diabetes and her son works outside daily. Pt was crying while leaving a msg, stating she hopes that provider says it is ok to have family members over to open gifts. Pls advise, thanks.

## 2019-05-04 ENCOUNTER — Encounter: Payer: Self-pay | Admitting: Osteopathic Medicine

## 2019-06-02 ENCOUNTER — Encounter: Payer: Self-pay | Admitting: Oncology

## 2019-06-02 ENCOUNTER — Encounter: Payer: Self-pay | Admitting: Osteopathic Medicine

## 2019-07-07 ENCOUNTER — Other Ambulatory Visit: Payer: Self-pay | Admitting: Oncology

## 2019-07-14 ENCOUNTER — Other Ambulatory Visit: Payer: BLUE CROSS/BLUE SHIELD

## 2019-07-21 ENCOUNTER — Ambulatory Visit
Admission: RE | Admit: 2019-07-21 | Discharge: 2019-07-21 | Disposition: A | Discharge status: Home / Self Care | Payer: BC Managed Care – PPO | Source: Ambulatory Visit | Attending: Oncology | Admitting: Oncology

## 2019-07-21 ENCOUNTER — Other Ambulatory Visit: Payer: Self-pay

## 2019-07-21 DIAGNOSIS — Z9889 Other specified postprocedural states: Secondary | ICD-10-CM

## 2019-07-21 DIAGNOSIS — R922 Inconclusive mammogram: Secondary | ICD-10-CM | POA: Diagnosis not present

## 2019-07-21 DIAGNOSIS — Z853 Personal history of malignant neoplasm of breast: Secondary | ICD-10-CM | POA: Diagnosis not present

## 2019-08-10 ENCOUNTER — Encounter: Payer: Self-pay | Admitting: Medical-Surgical

## 2019-08-26 ENCOUNTER — Telehealth: Payer: Self-pay | Admitting: Osteopathic Medicine

## 2019-08-26 DIAGNOSIS — Z0000 Encounter for general adult medical examination without abnormal findings: Secondary | ICD-10-CM

## 2019-08-26 NOTE — Telephone Encounter (Signed)
Orders are in I cancelled the Vitamin D and TSH since insurance won't cover these for annual testing

## 2019-08-26 NOTE — Telephone Encounter (Signed)
Patient advised.

## 2019-08-26 NOTE — Telephone Encounter (Signed)
Roshani called in this afternoon,inquiring about lab orders for her physical on 09/08/2019. I told her that someone will contact her when she is able to pick them up.

## 2019-08-26 NOTE — Telephone Encounter (Signed)
Annual labs pended for provider to review.

## 2019-09-03 LAB — CBC
HCT: 39.6 % (ref 35.0–45.0)
Hemoglobin: 13.3 g/dL (ref 11.7–15.5)
MCH: 30.9 pg (ref 27.0–33.0)
MCHC: 33.6 g/dL (ref 32.0–36.0)
MCV: 91.9 fL (ref 80.0–100.0)
MPV: 9.7 fL (ref 7.5–12.5)
Platelets: 299 10*3/uL (ref 140–400)
RBC: 4.31 10*6/uL (ref 3.80–5.10)
RDW: 12.5 % (ref 11.0–15.0)
WBC: 4.8 10*3/uL (ref 3.8–10.8)

## 2019-09-03 LAB — LIPID PANEL
Cholesterol: 238 mg/dL — ABNORMAL HIGH (ref <200)
HDL: 62 mg/dL (ref >=50)
LDL Cholesterol (Calc): 154 mg/dL (calc) — ABNORMAL HIGH
Non-HDL Cholesterol (Calc): 176 mg/dL (calc) — ABNORMAL HIGH (ref <130)
Total CHOL/HDL Ratio: 3.8 (calc) (ref <5.0)
Triglycerides: 108 mg/dL (ref <150)

## 2019-09-03 LAB — COMPLETE METABOLIC PANEL WITH GFR
AG Ratio: 1.8 (calc) (ref 1.0–2.5)
ALT: 15 U/L (ref 6–29)
AST: 18 U/L (ref 10–35)
Albumin: 4.3 g/dL (ref 3.6–5.1)
Alkaline phosphatase (APISO): 61 U/L (ref 37–153)
BUN: 12 mg/dL (ref 7–25)
CO2: 29 mmol/L (ref 20–32)
Calcium: 9.6 mg/dL (ref 8.6–10.4)
Chloride: 105 mmol/L (ref 98–110)
Creat: 0.79 mg/dL (ref 0.50–0.99)
GFR, Est African American: 92 mL/min/{1.73_m2} (ref >=60)
GFR, Est Non African American: 79 mL/min/{1.73_m2} (ref >=60)
Globulin: 2.4 g/dL (calc) (ref 1.9–3.7)
Glucose, Bld: 94 mg/dL (ref 65–99)
Potassium: 4.2 mmol/L (ref 3.5–5.3)
Sodium: 140 mmol/L (ref 135–146)
Total Bilirubin: 0.5 mg/dL (ref 0.2–1.2)
Total Protein: 6.7 g/dL (ref 6.1–8.1)

## 2019-09-04 ENCOUNTER — Other Ambulatory Visit: Payer: BLUE CROSS/BLUE SHIELD

## 2019-09-07 IMAGING — [facility] DIGITAL DIAGNOSTIC BILATERAL MAMMOGRAM WITH TOMO AND CAD
6 of 11 series · 6 of 31 positions shown · non-contrast
Comparison: Previous exam(s).

CLINICAL DATA: Status post left lumpectomy, radiation therapy and
chemotherapy for breast cancer in 3492. She is currently taking
anastrozole.

EXAM:
DIGITAL DIAGNOSTIC BILATERAL MAMMOGRAM WITH CAD AND TOMO

[L MLO]
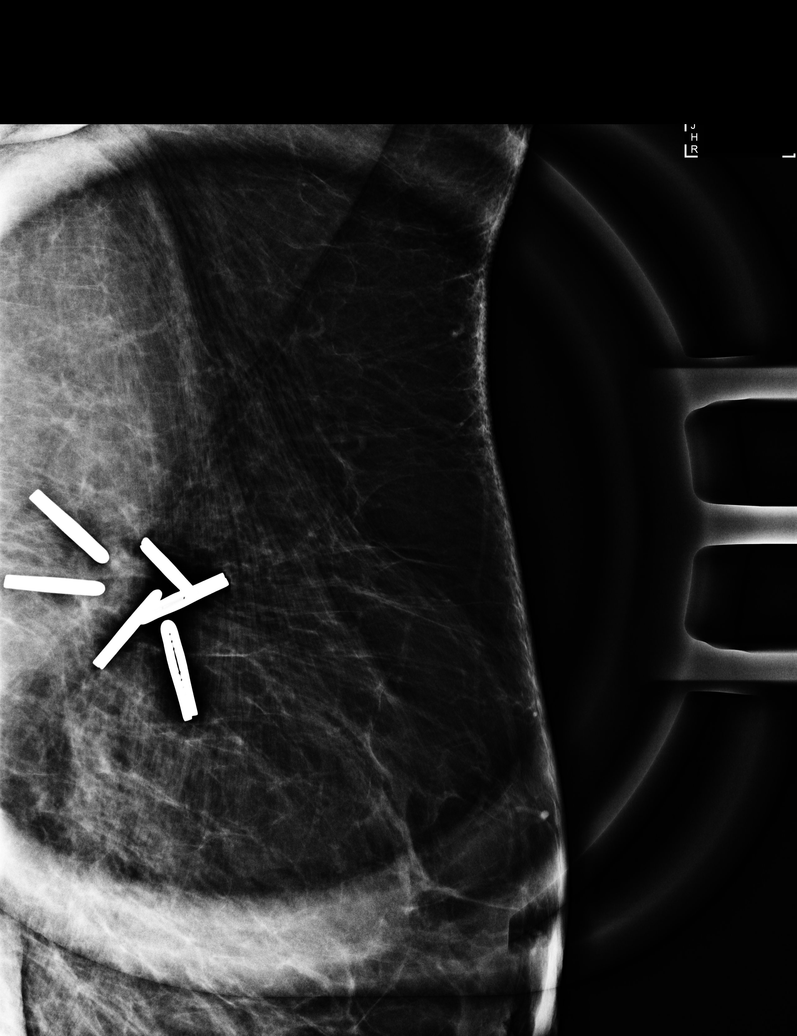

[L XCCL synth-2D]
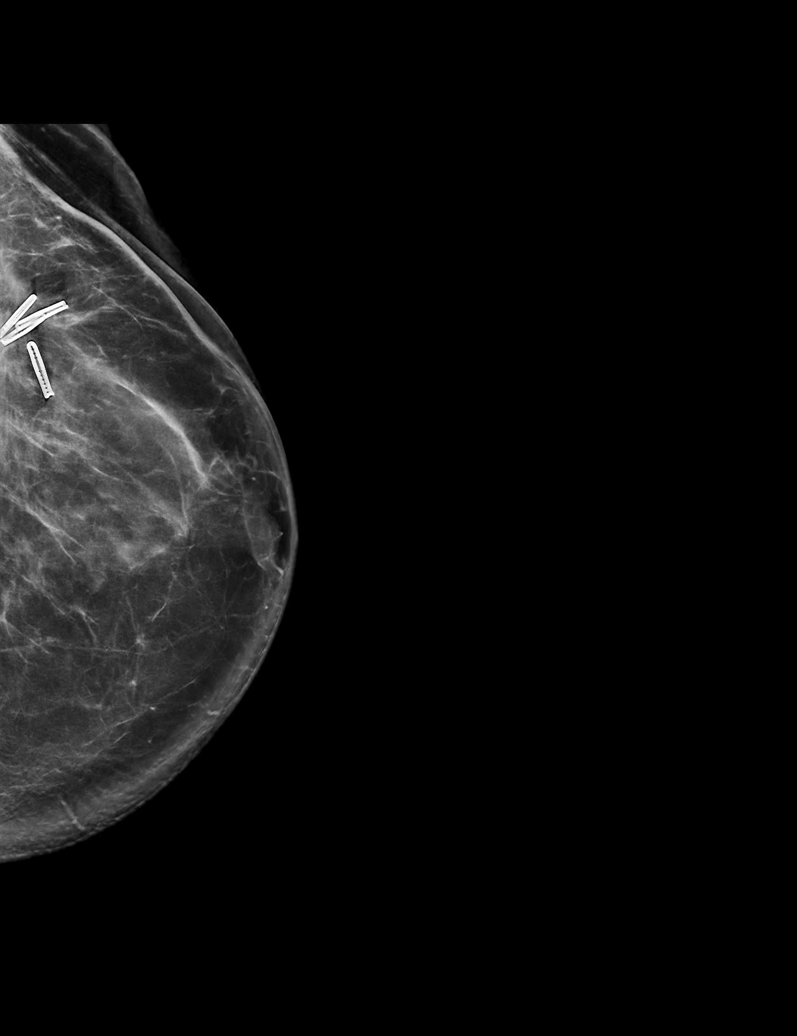

[R CC synth-2D]
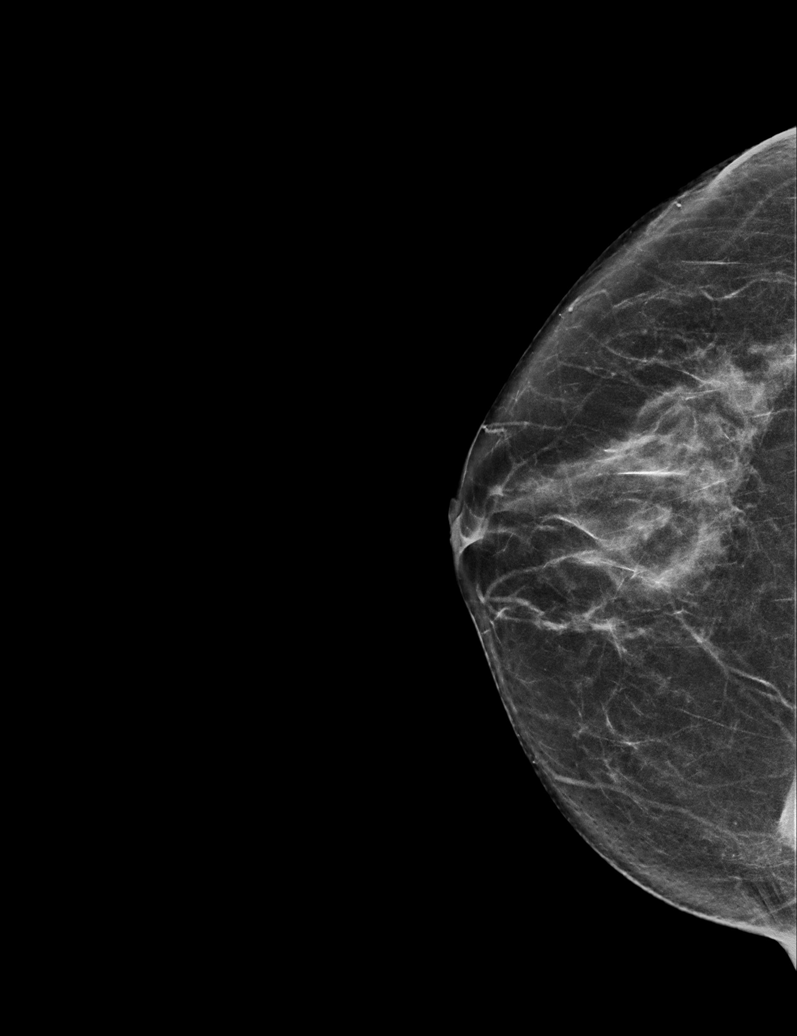

[R MLO synth-2D]
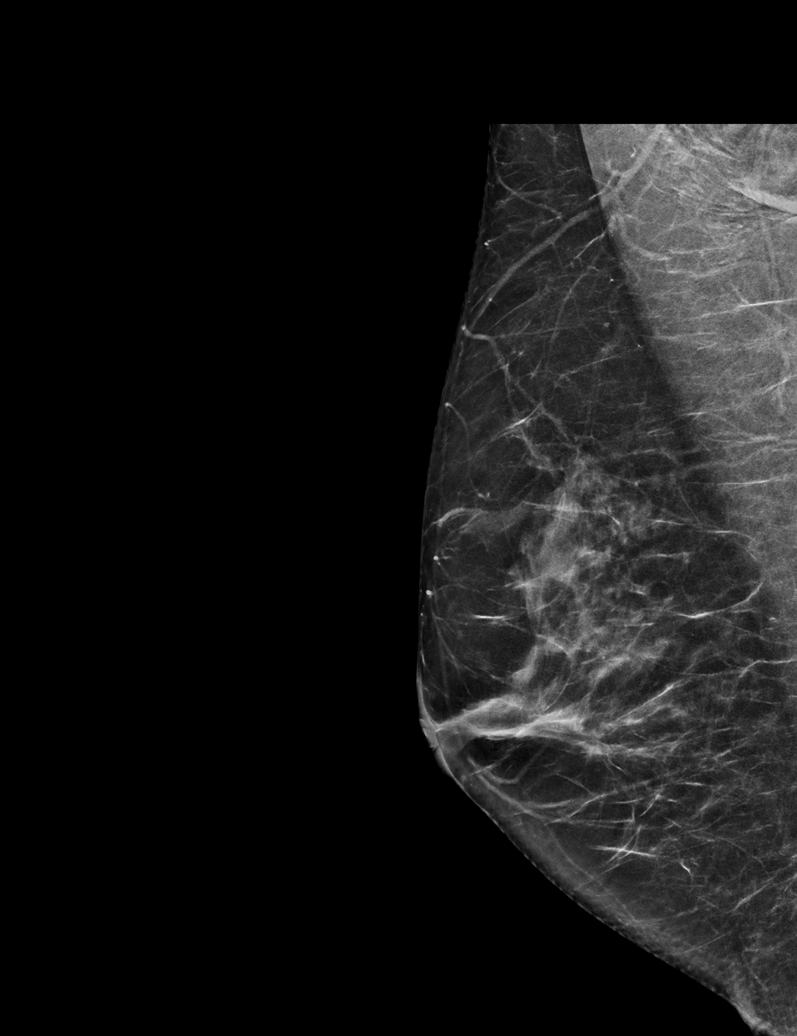

[L CC synth-2D]
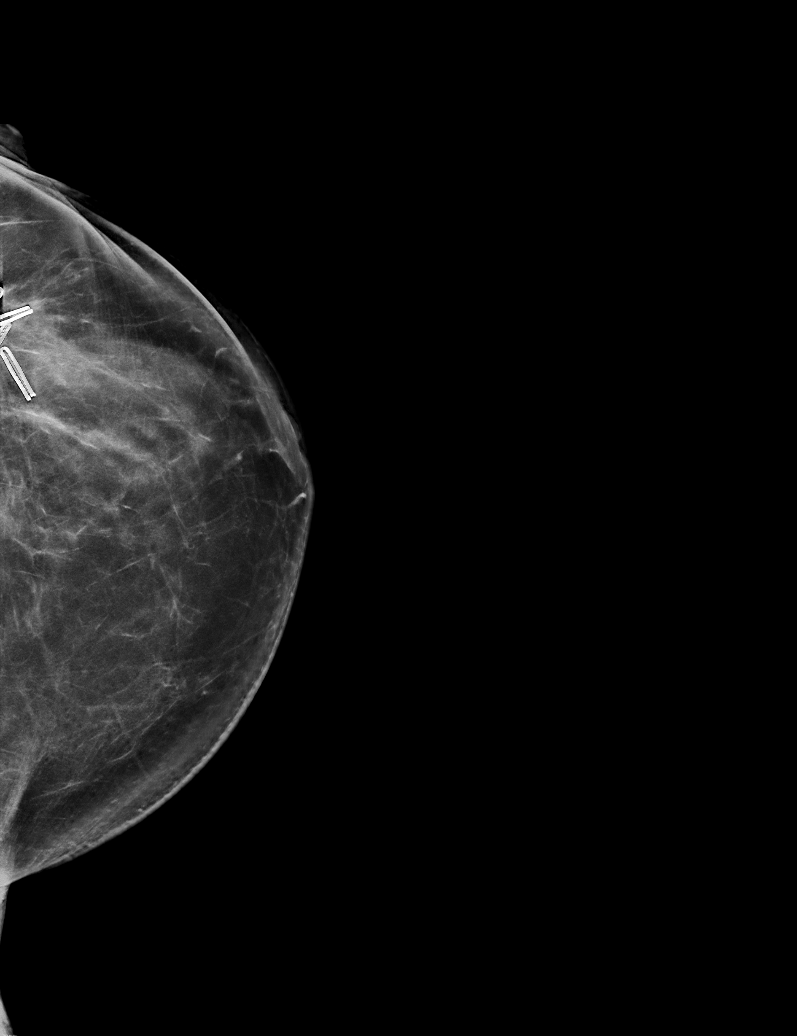

[L MLO synth-2D]
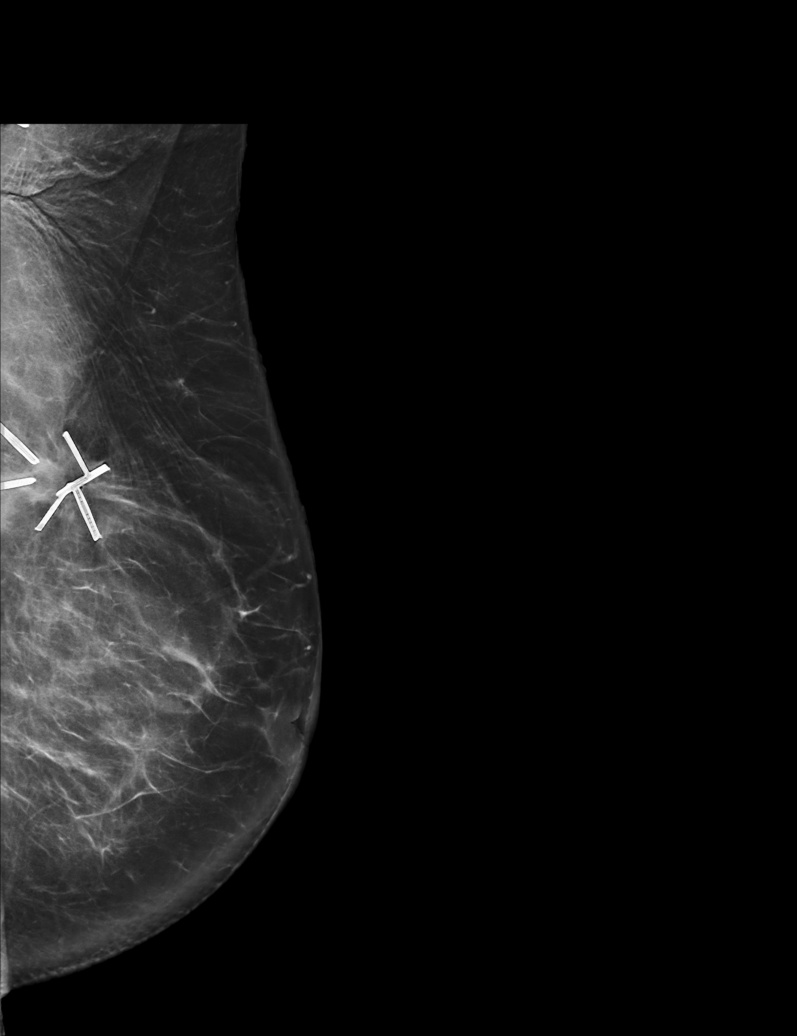

[6 of 31 positions shown; findings below may reference images not displayed]

ACR Breast Density Category c: The breast tissue is heterogeneously
dense, which may obscure small masses.
FINDINGS: Stable post lumpectomy changes on the left. No interval findings
suspicious for malignancy in either breast.

Mammographic images were processed with CAD.
IMPRESSION: No evidence of malignancy.

RECOMMENDATION:
Bilateral diagnostic mammogram in 1 year.

I have discussed the findings and recommendations with the patient.
Results were also provided in writing at the conclusion of the
visit. If applicable, a reminder letter will be sent to the patient
regarding the next appointment.

BI-RADS CATEGORY  2: Benign.

## 2019-09-08 ENCOUNTER — Encounter: Payer: Self-pay | Admitting: Nurse Practitioner

## 2019-09-08 ENCOUNTER — Ambulatory Visit (INDEPENDENT_AMBULATORY_CARE_PROVIDER_SITE_OTHER): Payer: BC Managed Care – PPO | Admitting: Nurse Practitioner

## 2019-09-08 ENCOUNTER — Other Ambulatory Visit: Payer: Self-pay

## 2019-09-08 ENCOUNTER — Encounter: Payer: BLUE CROSS/BLUE SHIELD | Admitting: Osteopathic Medicine

## 2019-09-08 ENCOUNTER — Encounter: Payer: Self-pay | Admitting: Oncology

## 2019-09-08 VITALS — BP 108/71 | HR 67 | Temp 98.0°F | Ht 64.25 in | Wt 155.3 lb

## 2019-09-08 DIAGNOSIS — Z0000 Encounter for general adult medical examination without abnormal findings: Secondary | ICD-10-CM

## 2019-09-08 DIAGNOSIS — E782 Mixed hyperlipidemia: Secondary | ICD-10-CM | POA: Diagnosis not present

## 2019-09-08 DIAGNOSIS — L989 Disorder of the skin and subcutaneous tissue, unspecified: Secondary | ICD-10-CM

## 2019-09-08 NOTE — Patient Instructions (Addendum)
Your labs looked good. Your cholesterol was borderline elevated, but nothing that we need to start medication for at this time. I would like you to try to decrease your fat and cholesterol intake in your diet and we will see if this helps with the numbers. We can plan to recheck the levels in 6 months.  The 10-year ASCVD risk score Mikey Bussing DC Brooke Bonito., et al., 2013) is: 3.8%   Values used to calculate the score:     Age: 64 years     Sex: Female     Is Non-Hispanic African American: No     Diabetic: No     Tobacco smoker: No     Systolic Blood Pressure: 212 mmHg     Is BP treated: No     HDL Cholesterol: 62 mg/dL     Total Cholesterol: 238 mg/dL   Preventive Care 8-25 Years Old, Female Preventive care refers to visits with your health care provider and lifestyle choices that can promote health and wellness. This includes:  A yearly physical exam. This may also be called an annual well check.  Regular dental visits and eye exams.  Immunizations.  Screening for certain conditions.  Healthy lifestyle choices, such as eating a healthy diet, getting regular exercise, not using drugs or products that contain nicotine and tobacco, and limiting alcohol use. What can I expect for my preventive care visit? Physical exam Your health care provider will check your:  Height and weight. This may be used to calculate body mass index (BMI), which tells if you are at a healthy weight.  Heart rate and blood pressure.  Skin for abnormal spots. Counseling Your health care provider may ask you questions about your:  Alcohol, tobacco, and drug use.  Emotional well-being.  Home and relationship well-being.  Sexual activity.  Eating habits.  Work and work Statistician.  Method of birth control.  Menstrual cycle.  Pregnancy history. What immunizations do I need?  Influenza (flu) vaccine  This is recommended every year. Tetanus, diphtheria, and pertussis (Tdap) vaccine  You may need a Td  booster every 10 years. Varicella (chickenpox) vaccine  You may need this if you have not been vaccinated. Zoster (shingles) vaccine  You may need this after age 71. Measles, mumps, and rubella (MMR) vaccine  You may need at least one dose of MMR if you were born in 1957 or later. You may also need a second dose. Pneumococcal conjugate (PCV13) vaccine  You may need this if you have certain conditions and were not previously vaccinated. Pneumococcal polysaccharide (PPSV23) vaccine  You may need one or two doses if you smoke cigarettes or if you have certain conditions. Meningococcal conjugate (MenACWY) vaccine  You may need this if you have certain conditions. Hepatitis A vaccine  You may need this if you have certain conditions or if you travel or work in places where you may be exposed to hepatitis A. Hepatitis B vaccine  You may need this if you have certain conditions or if you travel or work in places where you may be exposed to hepatitis B. Haemophilus influenzae type b (Hib) vaccine  You may need this if you have certain conditions. Human papillomavirus (HPV) vaccine  If recommended by your health care provider, you may need three doses over 6 months. You may receive vaccines as individual doses or as more than one vaccine together in one shot (combination vaccines). Talk with your health care provider about the risks and benefits of combination vaccines.  What tests do I need? Blood tests  Lipid and cholesterol levels. These may be checked every 5 years, or more frequently if you are over 53 years old.  Hepatitis C test.  Hepatitis B test. Screening  Lung cancer screening. You may have this screening every year starting at age 72 if you have a 30-pack-year history of smoking and currently smoke or have quit within the past 15 years.  Colorectal cancer screening. All adults should have this screening starting at age 71 and continuing until age 46. Your health care  provider may recommend screening at age 74 if you are at increased risk. You will have tests every 1-10 years, depending on your results and the type of screening test.  Diabetes screening. This is done by checking your blood sugar (glucose) after you have not eaten for a while (fasting). You may have this done every 1-3 years.  Mammogram. This may be done every 1-2 years. Talk with your health care provider about when you should start having regular mammograms. This may depend on whether you have a family history of breast cancer.  BRCA-related cancer screening. This may be done if you have a family history of breast, ovarian, tubal, or peritoneal cancers.  Pelvic exam and Pap test. This may be done every 3 years starting at age 2. Starting at age 53, this may be done every 5 years if you have a Pap test in combination with an HPV test. Other tests  Sexually transmitted disease (STD) testing.  Bone density scan. This is done to screen for osteoporosis. You may have this scan if you are at high risk for osteoporosis. Follow these instructions at home: Eating and drinking  Eat a diet that includes fresh fruits and vegetables, whole grains, lean protein, and low-fat dairy.  Take vitamin and mineral supplements as recommended by your health care provider.  Do not drink alcohol if: ? Your health care provider tells you not to drink. ? You are pregnant, may be pregnant, or are planning to become pregnant.  If you drink alcohol: ? Limit how much you have to 0-1 drink a day. ? Be aware of how much alcohol is in your drink. In the [place] one drink equals one 12 oz bottle of beer (355 mL), one 5 oz glass of wine (148 mL), or one 1 oz glass of hard liquor (44 mL). Lifestyle  Take daily care of your teeth and gums.  Stay active. Exercise for at least 30 minutes on 5 or more days each week.  Do not use any products that contain nicotine or tobacco, such as cigarettes, e-cigarettes, and chewing  tobacco. If you need help quitting, ask your health care provider.  If you are sexually active, practice safe sex. Use a condom or other form of birth control (contraception) in order to prevent pregnancy and STIs (sexually transmitted infections).  If told by your health care provider, take low-dose aspirin daily starting at age 38. What's next?  Visit your health care provider once a year for a well check visit.  Ask your health care provider how often you should have your eyes and teeth checked.  Stay up to date on all vaccines. This information is not intended to replace advice given to you by your health care provider. Make sure you discuss any questions you have with your health care provider. Document Revised: 01/16/2018 Document Reviewed: 01/16/2018 Elsevier Patient Education  2020 [employer].

## 2019-09-08 NOTE — Progress Notes (Signed)
Established Patient Office Visit  Subjective:  Patient ID: Jamie [name], female    DOB: July 14, 1955  Age: 64 y.o. MRN: [id number]  CC:  Chief Complaint  Patient presents with  . Annual Exam    HPI Jamie [name] is a pleasant 64 year old female presenting today for annual physical exam. She is overall feeling well without any significant health concerns.  She does have a positive history for breast cancer with lumpectomy, chemotherapy, and radiation treatment.  She also has a history of basal cell carcinoma.  She has been dealing with restless leg syndrome which does occasionally interfere with her sleep. She tries stretching, heating pad, "restful leg" cream, and tonic water to help when symptoms occur. Overall she does not feel they are a terrible inconvenience and is not interested in treatment at this time.   She does have a flesh colored skin lesion approximately 1 cm in diameter with an irregular border located on the right side of her abdomen. She just noticed the area a few days ago while in the shower. It is not sore or itching. She does have a history of basal cell carcinoma with Mohs surgery and would like a referral today to have this evaluated.   She has received both of her COVID-19 vaccines (Moderna) and tolerated those well.  She reports regular vision and dental exams. She reports a well-balanced, low fat diet. She exercises 5 days a week with walking, yoga, You Tube videos, and golf.  She is retired since 2017 and lives at home with her husband. She has 2 adult children. She does not smoke, vape, or use illegal or prescription drugs. She drinks 1-2 alcoholic beverages a week. Never more than one at a time.  She does have a history of chronic constipation well controlled with tea and herbal supplement.  She does have a history of breast cancer and is followed closely with yearly mammograms. She has been cancer free since 2018. Her last mammogram was in February and was  clear.   She had her last colonoscopy in 2019. She does have high risk genetic component for colon cancer, but denies any concerns today.   The 10-year ASCVD risk score Mikey Bussing DC Brooke Bonito., et al., 2013) is: 3.8%   Values used to calculate the score:     Age: 89 years     Sex: Female     Is Non-Hispanic African American: No     Diabetic: No     Tobacco smoker: No     Systolic Blood Pressure: 163 mmHg     Is BP treated: No     HDL Cholesterol: 62 mg/dL     Total Cholesterol: 238 mg/dL   Past Medical History:  Diagnosis Date  . Allergy   . Anemia   . Arthritis    feet  . Basal cell carcinoma   . Breast cancer ([place]) 06/2016   left breast  . Colon polyps   . Eczema   . Genetic testing 08/15/2016   Ms. Edge underwent genetic testing for hereditary cancer syndrome through [facility]'s 43-gene Common Hereditary Cancers Panel. Ms. 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 Result report is dated 08/15/2016. Please see genetic counseling documentation from 08/17/2016 for further discussion.  [name] History of radiation therapy 01/02/17-01/31/17   left breast was treated to 42.72 Gy in 16 fractions, left breast boost 10 Gy in 5 fractions  . Personal history of  chemotherapy   . Personal history of radiation therapy   . PONV (postoperative nausea and vomiting)     Past Surgical History:  Procedure Laterality Date  . BONE SPUR Bilateral 2001 AND 1988  . BREAST BIOPSY    . BREAST LUMPECTOMY Left    07/2016  . BREAST LUMPECTOMY WITH RADIOACTIVE SEED AND SENTINEL LYMPH NODE BIOPSY Left 07/26/2016   Procedure: BREAST LUMPECTOMY WITH RADIOACTIVE SEED AND SENTINEL LYMPH NODE BIOPSY;  Surgeon: [name], MD;  Location: [facility];  Service: General;  Laterality: Left;  . BUNIONECTOMY Bilateral 02/2012  . COLONOSCOPY W/ POLYPECTOMY  08/2007  . DILATION AND CURETTAGE OF UTERUS  2002  . MOHS SURGERY  2010  .  PORT-A-CATH REMOVAL N/A 07/18/2017   Procedure: REMOVAL PORT-A-CATH;  Surgeon: [name], MD;  Location: [facility];  Service: General;  Laterality: N/A;  . PORTACATH PLACEMENT Right 07/26/2016   Procedure: INSERTION PORT-A-CATH;  Surgeon: [name], MD;  Location: [facility];  Service: General;  Laterality: Right;    Family History  Problem Relation Age of Onset  . Ovarian cancer Mother 27  . Hypertension Father   . Stroke Father   . Heart disease Father   . Breast cancer Maternal Aunt 77       recurred at 22  . Heart disease Paternal Grandmother   . Osteoporosis Sister   . Liver cancer Maternal Uncle 26    Social History   Socioeconomic History  . Marital status: Married    Spouse name: Not on file  . Number of children: 1  . Years of education: Not on file  . Highest education level: Not on file  Occupational History  . Occupation: retired  Tobacco Use  . Smoking status: Never Smoker  . Smokeless tobacco: Never Used  Substance and Sexual Activity  . Alcohol use: Yes    Comment: 1 drink/week, wine or beer  . Drug use: No  . Sexual activity: Yes    Birth control/protection: Post-menopausal  Other Topics Concern  . Not on file  Social History Narrative   I biological son and 1 adopted daughter   Social Determinants of Health   Financial Resource Strain:   . Difficulty of Paying Living Expenses:   Food Insecurity:   . Worried About Charity fundraiser in the Last Year:   . Arboriculturist in the Last Year:   Transportation Needs:   . Film/video editor (Medical):   [name] Lack of Transportation (Non-Medical):   Physical Activity:   . Days of Exercise per Week:   . Minutes of Exercise per Session:   Stress:   . Feeling of Stress :   Social Connections:   . Frequency of Communication with Friends and Family:   . Frequency of Social Gatherings with Friends and Family:   . Attends Religious Services:   . Active Member of Clubs or Organizations:     . Attends Archivist Meetings:   [name] Marital Status:   Intimate Partner Violence:   . Fear of Current or Ex-Partner:   . Emotionally Abused:   [name] Physically Abused:   . Sexually Abused:     Outpatient Medications Prior to Visit  Medication Sig Dispense Refill  . anastrozole (ARIMIDEX) 1 MG tablet TAKE 1 TABLET(1 MG) BY MOUTH DAILY 90 tablet 4  . BIOTIN PO Take 1 tablet by mouth daily.    . calcium-vitamin D (OSCAL WITH D) 500-200 MG-UNIT tablet Take  1 tablet by mouth 2 (two) times daily.    . cetirizine (ZYRTEC) 10 MG tablet Take 10 mg by mouth daily as needed (for allergies--Spring/Summer).     . Cholecalciferol (VITAMIN D3) 1000 units CAPS Take 1,000 Units by mouth daily.     . Cyanocobalamin (B-12) 2500 MCG TABS Place 2,500 mcg under the tongue daily.     . diphenhydrAMINE (BENADRYL) 25 MG tablet Take 12.5 mg by mouth at bedtime as needed for sleep.    . Multiple Vitamin (MULTIVITAMIN) tablet Take 1 tablet by mouth daily.    . Red Yeast Rice Extract (RED YEAST RICE PO) Take 1 tablet by mouth 3 (three) times a week.    . denosumab (PROLIA) 60 MG/ML SOSY injection Inject 60 mg into the skin every 6 (six) months.     No facility-administered medications prior to visit.    Allergies  Allergen Reactions  . Adhesive [Tape] Rash  . Codeine Nausea Only and Other (See Comments)    Dizzy  . Sulfamethoxazole Nausea And Vomiting    ROS Review of Systems  Constitutional: Negative for activity change, appetite change, chills, fatigue, fever and unexpected weight change.  HENT: Negative for congestion, dental problem, ear pain, postnasal drip, sinus pressure, sinus pain and tinnitus.   Respiratory: Negative for cough, chest tightness, shortness of breath and wheezing.   Cardiovascular: Negative for chest pain, palpitations and leg swelling.  Gastrointestinal: Positive for constipation. Negative for abdominal distention, abdominal pain, diarrhea, nausea and vomiting.  Endocrine:  Negative for cold intolerance, heat intolerance, polydipsia, polyphagia and polyuria.  Genitourinary: Negative for difficulty urinating, dysuria, frequency, menstrual problem, urgency, vaginal bleeding, vaginal discharge and vaginal pain.  Musculoskeletal: Positive for back pain. Negative for myalgias.  Skin: Negative for color change, pallor, rash and wound.  Neurological: Negative for dizziness, tremors, syncope, weakness, light-headedness and headaches.  Psychiatric/Behavioral: Negative for dysphoric mood and sleep disturbance. The patient is not nervous/anxious.       Objective:    Physical Exam  Constitutional: She is oriented to person, place, and time. She appears well-developed and well-nourished.  HENT:  Head: Normocephalic and atraumatic.  Right Ear: External ear normal.  Left Ear: External ear normal.  Eyes: Pupils are equal, round, and reactive to light. Conjunctivae and EOM are normal.  Fundoscopic exam:      The right eye shows no hemorrhage and no papilledema. The right eye shows red reflex.       The left eye shows no hemorrhage and no papilledema. The left eye shows red reflex.  Neck: No JVD present. Carotid bruit is not present. No thyromegaly present.  Cardiovascular: Normal rate, regular rhythm, normal heart sounds and intact distal pulses.  No murmur heard. Pulmonary/Chest: Effort normal and breath sounds normal. No respiratory distress. She has no wheezes.  Abdominal: Soft. Bowel sounds are normal. She exhibits no distension and no mass. There is no hepatosplenomegaly. There is no abdominal tenderness. There is no rebound, no guarding and no CVA tenderness.  Musculoskeletal:        General: No tenderness or edema. Normal range of motion.     Cervical back: Normal range of motion and neck supple.     Lumbar back: No swelling, edema, tenderness or bony tenderness. Normal range of motion.  Lymphadenopathy:    She has no cervical adenopathy.  Neurological: She is  alert and oriented to person, place, and time. No cranial nerve deficit. She exhibits normal muscle tone. Coordination normal.  Skin: Skin is  warm and dry. No rash noted. No erythema.     Psychiatric: She has a normal mood and affect. Her behavior is normal. Judgment and thought content normal.  Nursing note and vitals reviewed.   BP 108/71   Pulse 67   Temp 98 F (36.7 C) (Oral)   Ht 5' 4.25" (1.632 m)   Wt 155 lb 4.8 oz (70.4 kg)   SpO2 96%   BMI 26.45 kg/m  Wt Readings from Last 3 Encounters:  09/08/19 155 lb 4.8 oz (70.4 kg)  01/13/19 154 lb 7 oz (70.1 kg)  05/08/18 152 lb 12.8 oz (69.3 kg)     There are no preventive care reminders to display for this patient.  There are no preventive care reminders to display for this patient.  Lab Results  Component Value Date   TSH 4.42 03/25/2018   Lab Results  Component Value Date   WBC 4.8 09/03/2019   HGB 13.3 09/03/2019   HCT 39.6 09/03/2019   MCV 91.9 09/03/2019   PLT 299 09/03/2019   Lab Results  Component Value Date   NA 140 09/03/2019   K 4.2 09/03/2019   CHLORIDE 106 05/15/2017   CO2 29 09/03/2019   GLUCOSE 94 09/03/2019   BUN 12 09/03/2019   CREATININE 0.79 09/03/2019   BILITOT 0.5 09/03/2019   ALKPHOS 51 01/13/2019   AST 18 09/03/2019   ALT 15 09/03/2019   PROT 6.7 09/03/2019   ALBUMIN 4.1 01/13/2019   CALCIUM 9.6 09/03/2019   ANIONGAP 9 01/13/2019   EGFR >60 05/15/2017   Lab Results  Component Value Date   CHOL 238 (H) 09/03/2019   Lab Results  Component Value Date   HDL 62 09/03/2019   Lab Results  Component Value Date   LDLCALC 154 (H) 09/03/2019   Lab Results  Component Value Date   TRIG 108 09/03/2019   Lab Results  Component Value Date   CHOLHDL 3.8 09/03/2019   No results found for: HGBA1C    Assessment & Plan:   1. Annual physical exam Annual physical examination unremarkable with exception of 1 cm irregular flesh-colored papule located right abdomen/flank.  Labs  performed prior to admission revealed slight elevation in LDL, most likely familial in origin based on the patient's diet and exercise habits.  ASCVD risk is 3.8%.  At this time a joint decision was made to not start statin therapy.  We will monitor in the future. TSH has been borderline elevated in the past.  We did discuss the option of repeat blood and running this test however at this time the patient is asymptomatic.  A joint decision was made to wait and perform testing at her next annual exam or sooner if she begins to experience symptoms. Patient is not due for Pap smear until 2022 and at that time will be over the age of 54 therefore she has completed Pap smears unless problems arise. At her next annual exam she will be due to begin DEXA scan. Encouraged the patient to continue with her well-balanced diet and 5-day the exercise regimen. Follow-up in 1 year for annual physical exam or sooner if needed.  2. Moderate mixed hyperlipidemia not requiring statin therapy Lab results reveal elevated LDL.  Discussed the option of statin therapy with this patient and at this time the joint decision was made to not start any treatment.  Patient's ASCVD risk score 3.8%.  Discussed the option of starting medication if the patient begins to experience hypertension, diabetes, or  elevation in lipids. Follow-up in 1 year for annual physical exam or sooner if needed.  3. Unknown skin lesion Approximate 1 cm flesh-colored skin lesion with asymmetrical border located on the right abdomen/flank..  Given the patient's history of basal cell carcinoma with need for Mohs surgery we will refer her to dermatology.  She is unable to see her previous dermatologist due to no longer accepting her insurance. - Ambulatory referral to Dermatology  Return in about 1 year (around 09/07/2020), or if symptoms worsen or fail to improve, for annual physical exam.  Orma Render, NP

## 2019-09-14 DIAGNOSIS — L821 Other seborrheic keratosis: Secondary | ICD-10-CM | POA: Diagnosis not present

## 2019-09-14 DIAGNOSIS — D225 Melanocytic nevi of trunk: Secondary | ICD-10-CM | POA: Diagnosis not present

## 2019-09-14 DIAGNOSIS — L570 Actinic keratosis: Secondary | ICD-10-CM | POA: Diagnosis not present

## 2019-09-14 DIAGNOSIS — C44519 Basal cell carcinoma of skin of other part of trunk: Secondary | ICD-10-CM | POA: Diagnosis not present

## 2019-09-14 DIAGNOSIS — D1801 Hemangioma of skin and subcutaneous tissue: Secondary | ICD-10-CM | POA: Diagnosis not present

## 2019-09-14 DIAGNOSIS — C44311 Basal cell carcinoma of skin of nose: Secondary | ICD-10-CM | POA: Diagnosis not present

## 2019-09-14 DIAGNOSIS — L814 Other melanin hyperpigmentation: Secondary | ICD-10-CM | POA: Diagnosis not present

## 2019-09-17 ENCOUNTER — Encounter: Payer: Self-pay | Admitting: Osteopathic Medicine

## 2019-09-17 ENCOUNTER — Ambulatory Visit (INDEPENDENT_AMBULATORY_CARE_PROVIDER_SITE_OTHER): Payer: BC Managed Care – PPO | Admitting: Osteopathic Medicine

## 2019-09-17 VITALS — BP 109/72 | HR 60 | Temp 98.0°F | Wt 153.0 lb

## 2019-09-17 DIAGNOSIS — I863 Vulval varices: Secondary | ICD-10-CM

## 2019-09-17 NOTE — Progress Notes (Signed)
Jamie [name] is a 64 y.o. female who presents to  [facility] at [facility]  today, 09/17/19, seeking care for the following: . 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  Small varicosity in vulva, skin otherwise normal, reassurance provided.        Follow-up instructions: Return if symptoms worsen or fail to improve.                                         BP 109/72 (BP Location: Left Arm, Patient Position: Sitting, Cuff Size: Normal)   Pulse 60   Temp 98 F (36.7 C) (Oral)   Wt 153 lb (69.4 kg)   BMI 26.06 kg/m   Current Meds  Medication Sig  . anastrozole (ARIMIDEX) 1 MG tablet TAKE 1 TABLET(1 MG) BY MOUTH DAILY  . BIOTIN PO Take 1 tablet by mouth daily.  . calcium-vitamin D (OSCAL WITH D) 500-200 MG-UNIT tablet Take 1 tablet by mouth 2 (two) times daily.  . cetirizine (ZYRTEC) 10 MG tablet Take 10 mg by mouth daily as needed (for allergies--Spring/Summer).   . Cholecalciferol (VITAMIN D3) 1000 units CAPS Take 1,000 Units by mouth daily.   . Cyanocobalamin (B-12) 2500 MCG TABS Place 2,500 mcg under the tongue daily.   . diphenhydrAMINE (BENADRYL) 25 MG tablet Take 12.5 mg by mouth at bedtime as needed for sleep.  . Multiple Vitamin (MULTIVITAMIN) tablet Take 1 tablet by mouth daily.  . Red Yeast Rice Extract (RED YEAST RICE PO) Take 1 tablet by mouth 3 (three) times a week.    No results found for this or any previous visit (from the past 72 hour(s)).  No results found.  Depression screen [facility] 2/9 09/17/2019 09/08/2019 05/08/2018  Decreased Interest 0 0 0  Down, Depressed, Hopeless 0 0 0  PHQ - 2 Score 0 0 0  Altered sleeping 1 1 1   Tired, decreased energy 0 0 1  Change in appetite 0 0 0  Feeling bad or failure about yourself  0 0 0  Trouble concentrating 0 0 0  Moving slowly or  fidgety/restless 0 0 0  Suicidal thoughts 0 0 0  PHQ-9 Score 1 1 2   Difficult doing work/chores Not difficult at all Not difficult at all Not difficult at all    GAD 7 : Generalized Anxiety Score 09/17/2019 09/08/2019 05/08/2018 04/03/2018  Nervous, Anxious, on Edge 0 1 1 0  Control/stop worrying 0 1 0 0  Worry too much - different things 0 0 1 0  Trouble relaxing 0 1 0 0  Restless 0 0 0 0  Easily annoyed or irritable 0 0 1 1  Afraid - awful might happen 0 0 - 0  Total GAD 7 Score 0 3 - 1  Anxiety Difficulty - Not difficult at all Not difficult at all Not difficult at all      All questions at time of visit were answered - patient instructed to contact office with any additional concerns or updates.  ER/RTC precautions were reviewed with the patient.  Please note: voice recognition software was used to produce this document, and typos may escape review. Please contact Dr. Sheppard Coil for any needed clarifications.

## 2019-10-05 DIAGNOSIS — C44519 Basal cell carcinoma of skin of other part of trunk: Secondary | ICD-10-CM | POA: Diagnosis not present

## 2020-01-07 ENCOUNTER — Other Ambulatory Visit: Payer: Self-pay

## 2020-01-07 ENCOUNTER — Ambulatory Visit
Admission: RE | Admit: 2020-01-07 | Discharge: 2020-01-07 | Disposition: A | Discharge status: Home / Self Care | Payer: PRIVATE HEALTH INSURANCE | Source: Ambulatory Visit | Attending: Oncology | Admitting: Oncology

## 2020-01-07 DIAGNOSIS — C50412 Malignant neoplasm of upper-outer quadrant of left female breast: Secondary | ICD-10-CM

## 2020-01-24 ENCOUNTER — Encounter: Payer: Self-pay | Admitting: Osteopathic Medicine

## 2020-01-26 NOTE — Telephone Encounter (Signed)
Can call to schedule flu vaccine No specific interval needed between flu vax and covid vax

## 2020-02-04 ENCOUNTER — Ambulatory Visit (INDEPENDENT_AMBULATORY_CARE_PROVIDER_SITE_OTHER): Payer: Medicare Other | Admitting: Osteopathic Medicine

## 2020-02-04 VITALS — BP 104/57 | HR 83

## 2020-02-04 DIAGNOSIS — Z23 Encounter for immunization: Secondary | ICD-10-CM

## 2020-02-04 NOTE — Addendum Note (Signed)
Addended byMargarette Asal L on: 02/04/2020 11:10 AM   Modules accepted: Level of Service

## 2020-02-11 ENCOUNTER — Other Ambulatory Visit: Payer: BLUE CROSS/BLUE SHIELD

## 2020-02-11 ENCOUNTER — Ambulatory Visit: Payer: BLUE CROSS/BLUE SHIELD | Admitting: Oncology

## 2020-02-17 ENCOUNTER — Encounter: Payer: Self-pay | Admitting: Osteopathic Medicine

## 2020-02-17 NOTE — Progress Notes (Signed)
[place]  Telephone:([phone] Fax:(336) [phone]     ID: Jamie [name] DOB: [birth year]  MR#: [id number]  TXM#:[id number]  Patient Care Team: Emeterio Reeve, DO as PCP - General (Osteopathic Medicine) [name], MD as Consulting Physician (General Surgery) [name], [name], MD as Consulting Physician (Oncology) [name], MD as Consulting Physician (Radiation Oncology) [name], MD as Referring Physician Armbruster, Carlota Raspberry, MD as Consulting Physician (Gastroenterology) Delice Bison Charlestine Massed, NP as Nurse Practitioner (Hematology and Oncology) OTHER MD:  CHIEF COMPLAINT: 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 This showed a possible mass in the left breast. On 07/02/2016 she underwent left diagnostic mammography with tomography and left breast ultrasonography. The breast density was category C. In the left breast upper outer quadrant there was a microlobulated mass which was not directly palpable, although there was slight thickening in the left breast 1:00 position. Targeted ultrasonography confirmed a solid hypoechoic microlobulated mass in the left breast 1:00 radiant 4 cm from the nipple, measuring 0.8 cm.  On 07/03/2016 Jamie [name] underwent biopsy of the left breast mass in question, and this showed ([id number]) invasive ductal carcinoma, grade 2 or 3, with extracellular mucin, estrogen receptor 5% positive with weak staining intensity, progesterone receptor negative, with an MIB-1 of 50%, and HER-2 amplified, the signals ratio being 5.71 and the number per cell 14.55.  Her subsequent history is as detailed below  INTERVAL HISTORY: Jamie [name] returns today for follow-up and treatment of her estrogen receptor positive with HER-2 amplification.   She  continues on anastrozole.  She tolerates this well.  She has mild vaginal dryness, occasional hip/low back arthralgias, along with occasional hot flashes. These are manageable for her.    Her most recent mammogram was completed on 07/21/2019 and showed no evidence of malignancy and breast density category C.  Her most recent bone density was completed in 12/2019 and showed stable osteopenia with a T score of -2.0.    She had received Prolia twice in the past, and had difficulty with jaw pain after receiving it.  She has continued on  Calcium, vitamin d and weight bearing exercises.    She has a MUTYH mutation and has more frequent colonoscopies.  Her next colonoscopy is due with Dr. Havery Moros in 2023.  REVIEW OF SYSTEMS: Declan sees her PCP regularly and exercises regularly.  She is feeling well and a detailed ROS was non contributory.    PAST MEDICAL HISTORY: Past Medical History:  Diagnosis Date  . Allergy   . Anemia   . Arthritis    feet  . Basal cell carcinoma   . Breast cancer ([place]) 06/2016   left breast  . Colon polyps   . Eczema   . Genetic testing 08/15/2016   [name] underwent genetic testing for hereditary cancer syndrome through [facility]'s 43-gene Common Hereditary Cancers Panel. [name] testing revealed a single pathogenic mutation in MUTYH and a variant of uncertain significance (VUS) in SDHB. Result report is dated 08/15/2016. Please see genetic counseling documentation from 08/17/2016 for further discussion.  [name] History of radiation therapy 01/02/17-01/31/17   left breast was treated to 42.72 Gy in 16 fractions, left breast boost 10 Gy in 5 fractions  . Personal history of chemotherapy   . Personal history of radiation therapy   . PONV (postoperative nausea and vomiting)  PAST SURGICAL HISTORY: Past Surgical History:  Procedure Laterality Date  . BONE SPUR Bilateral 2001 AND 1988  . BREAST BIOPSY    . BREAST LUMPECTOMY Left    07/2016  . BREAST LUMPECTOMY WITH  RADIOACTIVE SEED AND SENTINEL LYMPH NODE BIOPSY Left 07/26/2016   Procedure: BREAST LUMPECTOMY WITH RADIOACTIVE SEED AND SENTINEL LYMPH NODE BIOPSY;  Surgeon: [name], MD;  Location: [place];  Service: General;  Laterality: Left;  . BUNIONECTOMY Bilateral 02/2012  . COLONOSCOPY W/ POLYPECTOMY  08/2007  . DILATION AND CURETTAGE OF UTERUS  2002  . MOHS SURGERY  2010  . PORT-A-CATH REMOVAL N/A 07/18/2017   Procedure: REMOVAL PORT-A-CATH;  Surgeon: [name], MD;  Location: [place];  Service: General;  Laterality: N/A;  . PORTACATH PLACEMENT Right 07/26/2016   Procedure: INSERTION PORT-A-CATH;  Surgeon: [name], MD;  Location: [place];  Service: General;  Laterality: Right;    FAMILY HISTORY Family History  Problem Relation Age of Onset  . Ovarian cancer Mother 66  . Hypertension Father   . Stroke Father   . Heart disease Father   . Breast cancer Maternal Aunt 77       recurred at 21  . Heart disease Paternal Grandmother   . Osteoporosis Sister   . Liver cancer Maternal Uncle 82  The patient's father died at age 87, with some form of skin cancer, most likely melanoma. The patient's mother died at the age of 2 with ovarian cancer, which had been diagnosed a few months prior. The patient had no brothers, 1 sister. A maternal aunt was diagnosed with breast cancer at age 65, recurrent age 73.  GYNECOLOGIC HISTORY:  No LMP recorded. Patient is postmenopausal. Menarche age 52, the patient is GX P0. She stopped having periods approximately age 42. 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 Her husband [name] worked as a Dance movement psychotherapist for a [employer]. Their last name is pronounced foh-TEE-ah and they tell me it means "burning" in [place]. Their children are adopted. Jamie [name] lives in [place] and works in Press photographer, and Jamie [name] lives in [place] and is an Scientist, water quality. The  patient has 3 grandchildren. She is not a Ambulance person.    ADVANCED DIRECTIVES: In place   HEALTH MAINTENANCE: Social History   Tobacco Use  . Smoking status: Never Smoker  . Smokeless tobacco: Never Used  Vaping Use  . Vaping Use: Never used  Substance Use Topics  . Alcohol use: Yes    Comment: 1 drink/week, wine or beer  . Drug use: No     Colonoscopy:2009  IFO:YDXAJO 2018  Bone density: 02/26/2017 T-score of -2.0 at the left femur neck    Allergies  Allergen Reactions  . Adhesive [Tape] Rash  . Codeine Nausea Only and Other (See Comments)    Dizzy  . Sulfamethoxazole Nausea And Vomiting    Current Outpatient Medications  Medication Sig Dispense Refill  . anastrozole (ARIMIDEX) 1 MG tablet TAKE 1 TABLET(1 MG) BY MOUTH DAILY 90 tablet 4  . BIOTIN PO Take 1 tablet by mouth daily.    . calcium-vitamin D (OSCAL WITH D) 500-200 MG-UNIT tablet Take 1 tablet by mouth 2 (two) times daily.    . cetirizine (ZYRTEC) 10 MG tablet Take 10 mg by mouth daily as needed (for allergies--Spring/Summer).     . Cholecalciferol (VITAMIN D3) 1000 units CAPS Take 1,000 Units  by mouth daily.     . Cyanocobalamin (B-12) 2500 MCG TABS Place 2,500 mcg under the tongue daily.     . diphenhydrAMINE (BENADRYL) 25 MG tablet Take 12.5 mg by mouth at bedtime as needed for sleep.    . Multiple Vitamin (MULTIVITAMIN) tablet Take 1 tablet by mouth daily.    . Red Yeast Rice Extract (RED YEAST RICE PO) Take 1 tablet by mouth 3 (three) times a week.     No current facility-administered medications for this visit.    OBJECTIVE:  Vitals:   02/19/20 1009  BP: 101/68  Pulse: (!) 58  Resp: 20  Temp: 97.7 F (36.5 C)  SpO2: 100%    Body mass index is 26.69 kg/m.   Filed Weights   02/19/20 1009  Weight: 156 lb 11.2 oz (71.1 kg)   ECOG FS:1 GENERAL: Patient is a well appearing female in no acute distress HEENT:  Sclerae anicteric.  Oropharynx clear and moist. No ulcerations or evidence of  oropharyngeal candidiasis. Neck is supple.  NODES:  No cervical, supraclavicular, or axillary lymphadenopathy palpated.  BREAST EXAM: left breast s/p lumpectomy and radiation, right breast benign LUNGS:  Clear to auscultation bilaterally.  No wheezes or rhonchi. HEART:  Regular rate and rhythm. No murmur appreciated. ABDOMEN:  Soft, nontender.  Positive, normoactive bowel sounds. No organomegaly palpated. MSK:  No focal spinal tenderness to palpation. Full range of motion bilaterally in the upper extremities. EXTREMITIES:  No peripheral edema.   SKIN:  Clear with no obvious rashes or skin changes. No nail dyscrasia. NEURO:  Nonfocal. Well oriented.  Appropriate affect.    LAB RESULTS:  CMP     Component Value Date/Time   NA 140 02/19/2020 0908   NA 140 05/15/2017 0920   K 4.2 02/19/2020 0908   K 4.3 05/15/2017 0920   CL 104 02/19/2020 0908   CO2 29 02/19/2020 0908   CO2 25 05/15/2017 0920   GLUCOSE 64 (L) 02/19/2020 0908   GLUCOSE 94 05/15/2017 0920   BUN 7 (L) 02/19/2020 0908   BUN 11.5 05/15/2017 0920   CREATININE 0.82 02/19/2020 0908   CREATININE 0.79 09/03/2019 0839   CREATININE 0.8 05/15/2017 0920   CALCIUM 9.8 02/19/2020 0908   CALCIUM 9.3 05/15/2017 0920   PROT 7.3 02/19/2020 0908   PROT 6.5 05/15/2017 0920   ALBUMIN 4.0 02/19/2020 0908   ALBUMIN 3.8 05/15/2017 0920   AST 19 02/19/2020 0908   AST 17 05/15/2017 0920   ALT 16 02/19/2020 0908   ALT 10 05/15/2017 0920   ALKPHOS 65 02/19/2020 0908   ALKPHOS 53 05/15/2017 0920   BILITOT 0.3 02/19/2020 0908   BILITOT 0.36 05/15/2017 0920   GFRNONAA >60 02/19/2020 0908   GFRNONAA 79 09/03/2019 0839   GFRAA >60 02/19/2020 0908   GFRAA 92 09/03/2019 0839    INo results found for: SPEP, UPEP  Lab Results  Component Value Date   WBC 5.9 02/19/2020   NEUTROABS 3.4 02/19/2020   HGB 13.7 02/19/2020   HCT 40.6 02/19/2020   MCV 94.2 02/19/2020   PLT 288 02/19/2020      Chemistry      Component Value Date/Time     NA 140 02/19/2020 0908   NA 140 05/15/2017 0920   K 4.2 02/19/2020 0908   K 4.3 05/15/2017 0920   CL 104 02/19/2020 0908   CO2 29 02/19/2020 0908   CO2 25 05/15/2017 0920   BUN 7 (L) 02/19/2020 0908   BUN 11.5 05/15/2017  0920   CREATININE 0.82 02/19/2020 0908   CREATININE 0.79 09/03/2019 0839   CREATININE 0.8 05/15/2017 0920   GLU 73 08/02/2014 0000      Component Value Date/Time   CALCIUM 9.8 02/19/2020 0908   CALCIUM 9.3 05/15/2017 0920   ALKPHOS 65 02/19/2020 0908   ALKPHOS 53 05/15/2017 0920   AST 19 02/19/2020 0908   AST 17 05/15/2017 0920   ALT 16 02/19/2020 0908   ALT 10 05/15/2017 0920   BILITOT 0.3 02/19/2020 0908   BILITOT 0.36 05/15/2017 0920       No results found for: LABCA2  No components found for: LABCA125  No results for input(s): INR in the last 168 hours.  Urinalysis    Component Value Date/Time   COLORURINE COLORLESS (A) 12/22/2016 2105   APPEARANCEUR CLEAR 12/22/2016 2105   LABSPEC 1.005 12/22/2016 2105   PHURINE 7.0 12/22/2016 2105   GLUCOSEU NEGATIVE 12/22/2016 2105   HGBUR NEGATIVE 12/22/2016 2105   BILIRUBINUR negative 08/16/2017 0855   KETONESUR NEGATIVE 12/22/2016 2105   PROTEINUR negative 08/16/2017 0855   PROTEINUR NEGATIVE 12/22/2016 2105   UROBILINOGEN 0.2 08/16/2017 0855   NITRITE negative 08/16/2017 0855   NITRITE NEGATIVE 12/22/2016 2105   LEUKOCYTESUR Negative 08/16/2017 0855     STUDIES: CLINICAL DATA:  64 year old female for annual follow-up. History of LEFT breast cancer and lumpectomy in 2018.  EXAM: DIGITAL DIAGNOSTIC BILATERAL MAMMOGRAM WITH CAD AND TOMO  COMPARISON:  Previous exam(s).  ACR Breast Density Category c: The breast tissue is heterogeneously dense, which may obscure small masses.  FINDINGS: 2D and 3D full field views of both breasts and a magnification view of the lumpectomy site demonstrate no suspicious mass, nonsurgical distortion or worrisome calcifications.  LEFT lumpectomy  changes again noted now with some dystrophic calcifications.  Mammographic images were processed with CAD.  IMPRESSION: No mammographic evidence of breast malignancy  RECOMMENDATION: Bilateral diagnostic mammogram in 1 year.  I have discussed the findings and recommendations with the patient. If applicable, a reminder letter will be sent to the patient regarding the next appointment.  BI-RADS CATEGORY  2: Benign.   Electronically Signed   By: Margarette Canada M.D.   On: 07/21/2019 13:39  ELIGIBLE FOR AVAILABLE RESEARCH PROTOCOL: no  ASSESSMENT: 64 y.o. [place] Ser, [name] woman status post biopsy of the left breast upper outer quadrant lesion 07/03/2016 showing a clinical T1b N0, stage 1B invasive ductal carcinoma, grade 2 or 3, estrogen receptor weakly positive at 5%, progesterone receptor negative, but HER-2 strongly amplified, with an MIB-1 of 50%.  (1) status post left lumpectomy with sentinel lymph node sampling 07/26/2016 for a pT1c pN0, stage IA invasive ductal carcinoma, grade 3, with extracellular mucin, and negative margins  (2) chemotherapy consisting of Paclitaxel weekly starting 09/18/2016, discontinued after one cycle due to peripheral neuropathy.   (a) Gemcitabine and Carboplatin started 10/02/2016, repeated days 1 and 8 of each 21 day cycle to a total of 8 doses (4 cycles)-- final dose 12/18/2016  (3) trastuzumab started 08/07/2016, completed 12 months on 07/17/2017  (a) baseline echocardiogram 07/25/2016 shows an ejection fraction of 60-65%  (b) repeat echocardiogram 10/22/2016 shows stable ejection fraction  (c) echocardiogram January 24, 2017 found an ejection fraction in the 55-60% range.  (d) echocardiogram 05/02/2017 showed an ejection fraction of 55-60%  (e) final echocardiogram on 08/06/2017 showed an ejection fraction in the 55-60% range  (4) adjuvant radiation 01/02/2017-01/31/2017:  Left breast was treated to 42.72 Gy in 16 fractions and boosted to 10  Gy  in 5 fractions.  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   (a) associated with increased colorectal cancer risk, possibly breast cancer (at least in [place])  (b) colonoscopy 09/11/2007/ Ezzie Dural  (c) colonoscopy 04/17/2017/ Armbruster-- next due 2023   PLAN: Ramon is here today for f/u of her left sided ER/HER2 positive breast cancer.  She has no clinical or radiographic sign of breast cancer recurrence.  She continues on treatment with anastrozole with good tolerance.  She will continue this.  She is due for mammogram in 07/2020.  Jamie [name] and I discussed her recent bone density report and her difficulty with prolia.  Her bone density is stable.  We reviewed bone health in detail and I gave her a handout in her AVS. I recommended continuing with calcium, vitamin d, and weight bearing exercise.  Her next bone density is due in 12/2021.  Shaelin was recommended to continue f/u with her PCP and undergo colonoscopy when due in 2023.  She was recommended continue healthy diet and exercise as she as been.  We will see her back in 1 year for labs and f/u.    She knows to call for any questions that may arise between now and her next appointment.  We are happy to see her sooner if needed.  Total encounter time: 20 minutes*   Wilber Bihari, NP 02/19/20 10:40 AM Medical Oncology and Hematology [facility] [place], [place] [address] Tel. [phone]    Fax. [phone]  *Total Encounter Time as defined by the Centers for Medicare and  Medicaid Services includes, in addition to the face-to-face time of a patient visit (documented in the note above) non-face-to-face time: obtaining and reviewing outside history, ordering and reviewing medications, tests or procedures, care coordination (communications with other health care professionals or caregivers) and documentation in the medical record.

## 2020-02-19 ENCOUNTER — Other Ambulatory Visit: Payer: Self-pay

## 2020-02-19 ENCOUNTER — Inpatient Hospital Stay: Payer: Medicare Other | Attending: Adult Health

## 2020-02-19 ENCOUNTER — Encounter: Payer: Self-pay | Admitting: Adult Health

## 2020-02-19 ENCOUNTER — Inpatient Hospital Stay (HOSPITAL_BASED_OUTPATIENT_CLINIC_OR_DEPARTMENT_OTHER): Payer: Medicare Other | Admitting: Adult Health

## 2020-02-19 VITALS — BP 101/68 | HR 58 | Temp 97.7°F | Resp 20 | Ht 64.25 in | Wt 156.7 lb

## 2020-02-19 DIAGNOSIS — C50412 Malignant neoplasm of upper-outer quadrant of left female breast: Secondary | ICD-10-CM

## 2020-02-19 DIAGNOSIS — Z170 Estrogen receptor positive status [ER+]: Secondary | ICD-10-CM | POA: Insufficient documentation

## 2020-02-19 DIAGNOSIS — Z923 Personal history of irradiation: Secondary | ICD-10-CM | POA: Diagnosis not present

## 2020-02-19 LAB — CMP (CANCER CENTER ONLY)
ALT: 16 U/L (ref 0–44)
AST: 19 U/L (ref 15–41)
Albumin: 4 g/dL (ref 3.5–5.0)
Alkaline Phosphatase: 65 U/L (ref 38–126)
Anion gap: 7 (ref 5–15)
BUN: 7 mg/dL — ABNORMAL LOW (ref 8–23)
CO2: 29 mmol/L (ref 22–32)
Calcium: 9.8 mg/dL (ref 8.9–10.3)
Chloride: 104 mmol/L (ref 98–111)
Creatinine: 0.82 mg/dL (ref 0.44–1.00)
GFR, Est AFR Am: >60 mL/min (ref >60)
GFR, Estimated: >60 mL/min (ref >60)
Glucose, Bld: 64 mg/dL — ABNORMAL LOW (ref 70–99)
Potassium: 4.2 mmol/L (ref 3.5–5.1)
Sodium: 140 mmol/L (ref 135–145)
Total Bilirubin: 0.3 mg/dL (ref 0.3–1.2)
Total Protein: 7.3 g/dL (ref 6.5–8.1)

## 2020-02-19 LAB — CBC WITH DIFFERENTIAL (CANCER CENTER ONLY)
Abs Immature Granulocytes: 0.01 10*3/uL (ref 0.00–0.07)
Basophils Absolute: 0.1 10*3/uL (ref 0.0–0.1)
Basophils Relative: 1 %
Eosinophils Absolute: 0.2 10*3/uL (ref 0.0–0.5)
Eosinophils Relative: 4 %
HCT: 40.6 % (ref 36.0–46.0)
Hemoglobin: 13.7 g/dL (ref 12.0–15.0)
Immature Granulocytes: 0 %
Lymphocytes Relative: 29 %
Lymphs Abs: 1.7 10*3/uL (ref 0.7–4.0)
MCH: 31.8 pg (ref 26.0–34.0)
MCHC: 33.7 g/dL (ref 30.0–36.0)
MCV: 94.2 fL (ref 80.0–100.0)
Monocytes Absolute: 0.5 10*3/uL (ref 0.1–1.0)
Monocytes Relative: 8 %
Neutro Abs: 3.4 10*3/uL (ref 1.7–7.7)
Neutrophils Relative %: 58 %
Platelet Count: 288 10*3/uL (ref 150–400)
RBC: 4.31 MIL/uL (ref 3.87–5.11)
RDW: 11.9 % (ref 11.5–15.5)
WBC Count: 5.9 10*3/uL (ref 4.0–10.5)
nRBC: 0 % (ref 0.0–0.2)

## 2020-02-19 NOTE — Patient Instructions (Signed)

## 2020-02-23 ENCOUNTER — Telehealth: Payer: Self-pay | Admitting: Adult Health

## 2020-02-23 NOTE — Telephone Encounter (Signed)
Scheduled per 10/1 los. Called and spoke with pt, confirmed 10/3 appts

## 2020-03-09 ENCOUNTER — Other Ambulatory Visit: Payer: Self-pay | Admitting: *Deleted

## 2020-03-09 MED ORDER — ANASTROZOLE 1 MG PO TABS: ORAL_TABLET | ORAL | 4 refills | Status: DC

## 2020-03-18 ENCOUNTER — Other Ambulatory Visit: Payer: Self-pay

## 2020-03-18 ENCOUNTER — Ambulatory Visit (INDEPENDENT_AMBULATORY_CARE_PROVIDER_SITE_OTHER): Payer: Medicare Other | Admitting: Podiatry

## 2020-03-18 DIAGNOSIS — L603 Nail dystrophy: Secondary | ICD-10-CM

## 2020-03-18 DIAGNOSIS — B351 Tinea unguium: Secondary | ICD-10-CM

## 2020-03-18 NOTE — Progress Notes (Signed)
Subjective:   Patient ID: Jamie [name], female   DOB: 64 y.o.   MRN: [id number]   HPI 64 year old female presents the office today for concerns of her nails splitting longitudinally within the nail which is been ongoing for several years.  She did see the dermatologist several years ago and was told did not have fungus and she was given a cream around the nail which is not helpful.  She states that as the nail cracks will cause ingrown toenail to that point she will get discomfort.  Denies any drainage or swelling or any redness at this time.  No other concerns today.   Review of Systems  All other systems reviewed and are negative.  Past Medical History:  Diagnosis Date  . Allergy   . Anemia   . Arthritis    feet  . Basal cell carcinoma   . Breast cancer ([place]) 06/2016   left breast  . Colon polyps   . Eczema   . Genetic testing 08/15/2016   Ms. Ursin underwent genetic testing for hereditary cancer syndrome through [facility]'s 43-gene Common Hereditary Cancers Panel. Ms. Grassi testing revealed a single pathogenic mutation in MUTYH and a variant of uncertain significance (VUS) in SDHB. Result report is dated 08/15/2016. Please see genetic counseling documentation from 08/17/2016 for further discussion.  [name] History of radiation therapy 01/02/17-01/31/17   left breast was treated to 42.72 Gy in 16 fractions, left breast boost 10 Gy in 5 fractions  . Personal history of chemotherapy   . Personal history of radiation therapy   . PONV (postoperative nausea and vomiting)     Past Surgical History:  Procedure Laterality Date  . BONE SPUR Bilateral 2001 AND 1988  . BREAST BIOPSY    . BREAST LUMPECTOMY Left    07/2016  . BREAST LUMPECTOMY WITH RADIOACTIVE SEED AND SENTINEL LYMPH NODE BIOPSY Left 07/26/2016   Procedure: BREAST LUMPECTOMY WITH RADIOACTIVE SEED AND SENTINEL LYMPH NODE BIOPSY;  Surgeon: [name], MD;  Location: [place];  Service: General;  Laterality: Left;   . BUNIONECTOMY Bilateral 02/2012  . COLONOSCOPY W/ POLYPECTOMY  08/2007  . DILATION AND CURETTAGE OF UTERUS  2002  . MOHS SURGERY  2010  . PORT-A-CATH REMOVAL N/A 07/18/2017   Procedure: REMOVAL PORT-A-CATH;  Surgeon: [name], MD;  Location: [place];  Service: General;  Laterality: N/A;  . PORTACATH PLACEMENT Right 07/26/2016   Procedure: INSERTION PORT-A-CATH;  Surgeon: [name], MD;  Location: [place];  Service: General;  Laterality: Right;     Current Outpatient Medications:  .  anastrozole (ARIMIDEX) 1 MG tablet, Take 1 tablet (1 mg total) by mouth daily., Disp: 90 tablet, Rfl: 4 .  BIOTIN PO, Take 1 tablet by mouth daily., Disp: , Rfl:  .  calcium-vitamin D (OSCAL WITH D) 500-200 MG-UNIT tablet, Take 1 tablet by mouth 2 (two) times daily., Disp: , Rfl:  .  cetirizine (ZYRTEC) 10 MG tablet, Take 10 mg by mouth daily as needed (for allergies--Spring/Summer). , Disp: , Rfl:  .  Cholecalciferol (VITAMIN D3) 1000 units CAPS, Take 1,000 Units by mouth daily. , Disp: , Rfl:  .  Cyanocobalamin (B-12) 2500 MCG TABS, Place 2,500 mcg under the tongue daily. , Disp: , Rfl:  .  diphenhydrAMINE (BENADRYL) 25 MG tablet, Take 12.5 mg by mouth at bedtime as needed for sleep., Disp: , Rfl:  .  Multiple Vitamin (MULTIVITAMIN) tablet, Take 1 tablet by mouth daily., Disp: , Rfl:  .  Red Yeast Rice Extract (RED YEAST RICE PO), Take 1 tablet by mouth 3 (three) times a week., Disp: , Rfl:   Allergies  Allergen Reactions  . Adhesive [Tape] Rash  . Codeine Nausea Only and Other (See Comments)    Dizzy  . Sulfamethoxazole Nausea And Vomiting         Objective:  Physical Exam  General: AAO x3, NAD  Dermatological: The nails may be hypertrophic, dystrophic with yellow-brown discoloration as well as white discoloration.  There is longitudinal splitting within the medial one third of the nail but does not go all the way down to the base of the nail.  There is no edema, erythema  any signs of infection today.  There are no open sores, no preulcerative lesions, no rash or signs of infection present.  Vascular: Dorsalis Pedis artery and Posterior Tibial artery pedal pulses are 2/4 bilateral with immedate capillary fill time. There is no pain with calf compression, swelling, warmth, erythema.   Neruologic: Grossly intact via light touch bilateral.   Musculoskeletal: No gross boney pedal deformities bilateral. No pain, crepitus, or limitation noted with foot and ankle range of motion bilateral. Muscular strength 5/5 in all groups tested bilateral.  Gait: Unassisted, Nonantalgic.       Assessment:   Onychodystrophy, possible onychomycosis    Plan:  -Treatment options discussed including all alternatives, risks, and complications -Etiology of symptoms were discussed -We discussed multiple treatment options including oral medication for nail fungus versus topical for dystrophy/fungus.  However prior to starting medication discussed with her biopsy of the nails she agrees with this.  Sharply debrided the nails with any complications as this for culture, pathology to [facility] labs.  Trula Slade DPM

## 2020-04-04 ENCOUNTER — Encounter: Payer: Self-pay | Admitting: Podiatry

## 2020-04-05 ENCOUNTER — Other Ambulatory Visit: Payer: Self-pay | Admitting: Podiatry

## 2020-04-05 MED ORDER — GENTAMICIN SULFATE 0.1 % EX CREA: 1.0000 "application " | TOPICAL_CREAM | Freq: Three times a day (TID) | CUTANEOUS | 0 refills | Status: DC

## 2020-04-05 MED ORDER — GORDONS UREA 40 % EX OINT: TOPICAL_OINTMENT | CUTANEOUS | 0 refills | Status: DC | PRN

## 2020-04-07 ENCOUNTER — Other Ambulatory Visit: Payer: Self-pay | Admitting: Podiatry

## 2020-04-07 MED ORDER — GENTAMICIN SULFATE 0.1 % EX CREA
1.0000 | TOPICAL_CREAM | Freq: Three times a day (TID) | CUTANEOUS | 0 refills | Status: DC
Start: 2020-04-07 — End: 2021-10-13

## 2020-04-07 MED ORDER — GORDONS UREA 40 % EX OINT: TOPICAL_OINTMENT | CUTANEOUS | 0 refills | Status: AC | PRN

## 2020-04-11 ENCOUNTER — Other Ambulatory Visit: Payer: Self-pay | Admitting: Podiatry

## 2020-04-11 NOTE — Progress Notes (Signed)
error 

## 2020-05-03 ENCOUNTER — Encounter: Payer: Self-pay | Admitting: Osteopathic Medicine

## 2020-05-29 ENCOUNTER — Encounter: Payer: Self-pay | Admitting: Osteopathic Medicine

## 2020-06-15 ENCOUNTER — Other Ambulatory Visit: Payer: Self-pay | Admitting: Oncology

## 2020-06-15 DIAGNOSIS — Z9889 Other specified postprocedural states: Secondary | ICD-10-CM

## 2020-07-29 ENCOUNTER — Other Ambulatory Visit: Payer: Self-pay

## 2020-07-29 ENCOUNTER — Other Ambulatory Visit: Payer: Self-pay | Admitting: Oncology

## 2020-07-29 ENCOUNTER — Ambulatory Visit
Admission: RE | Admit: 2020-07-29 | Discharge: 2020-07-29 | Disposition: A | Discharge status: Home / Self Care | Payer: Medicare Other | Source: Ambulatory Visit | Attending: Oncology | Admitting: Oncology

## 2020-07-29 DIAGNOSIS — Z9889 Other specified postprocedural states: Secondary | ICD-10-CM

## 2020-07-29 DIAGNOSIS — R921 Mammographic calcification found on diagnostic imaging of breast: Secondary | ICD-10-CM

## 2020-09-09 ENCOUNTER — Telehealth: Payer: Self-pay

## 2020-09-09 DIAGNOSIS — Z0000 Encounter for general adult medical examination without abnormal findings: Secondary | ICD-10-CM

## 2020-09-09 DIAGNOSIS — E038 Other specified hypothyroidism: Secondary | ICD-10-CM

## 2020-09-09 DIAGNOSIS — E782 Mixed hyperlipidemia: Secondary | ICD-10-CM

## 2020-09-09 DIAGNOSIS — R7989 Other specified abnormal findings of blood chemistry: Secondary | ICD-10-CM

## 2020-09-09 NOTE — Telephone Encounter (Signed)
Routine labs are not covered under a MWV. If her insurance covers a yearly physical, can certainly do the labs under that. Otherwise, will plan to just check TSH and Lipids.

## 2020-09-09 NOTE — Telephone Encounter (Signed)
Pt called stating she has her initial [place] scheduled for 09/19/20. Requesting annual labs. Order pended. Does covering provider want to add other testing/dxs?

## 2020-09-12 NOTE — Telephone Encounter (Signed)
Task completed. Pt has been updated of covering provider's note. Pt will complete labs fasting sometime this week.

## 2020-09-13 NOTE — Addendum Note (Signed)
Addended by: Narda Rutherford on: 09/13/2020 08:26 AM   Modules accepted: Orders

## 2020-09-15 LAB — COMPLETE METABOLIC PANEL WITH GFR
AG Ratio: 1.7 (calc) (ref 1.0–2.5)
ALT: 15 U/L (ref 6–29)
AST: 18 U/L (ref 10–35)
Albumin: 4.4 g/dL (ref 3.6–5.1)
Alkaline phosphatase (APISO): 58 U/L (ref 37–153)
BUN: 9 mg/dL (ref 7–25)
CO2: 31 mmol/L (ref 20–32)
Calcium: 9.6 mg/dL (ref 8.6–10.4)
Chloride: 105 mmol/L (ref 98–110)
Creat: 0.78 mg/dL (ref 0.50–0.99)
GFR, Est African American: 92 mL/min/{1.73_m2} (ref >=60)
GFR, Est Non African American: 80 mL/min/{1.73_m2} (ref >=60)
Globulin: 2.6 g/dL (calc) (ref 1.9–3.7)
Glucose, Bld: 95 mg/dL (ref 65–99)
Potassium: 4.2 mmol/L (ref 3.5–5.3)
Sodium: 141 mmol/L (ref 135–146)
Total Bilirubin: 0.4 mg/dL (ref 0.2–1.2)
Total Protein: 7 g/dL (ref 6.1–8.1)

## 2020-09-15 LAB — LIPID PANEL W/REFLEX DIRECT LDL
Cholesterol: 273 mg/dL — ABNORMAL HIGH (ref <200)
HDL: 65 mg/dL (ref >=50)
LDL Cholesterol (Calc): 174 mg/dL (calc) — ABNORMAL HIGH
Non-HDL Cholesterol (Calc): 208 mg/dL (calc) — ABNORMAL HIGH (ref <130)
Total CHOL/HDL Ratio: 4.2 (calc) (ref <5.0)
Triglycerides: 188 mg/dL — ABNORMAL HIGH (ref <150)

## 2020-09-15 LAB — CBC WITH DIFFERENTIAL/PLATELET
Absolute Monocytes: 501 cells/uL (ref 200–950)
Basophils Absolute: 61 cells/uL (ref 0–200)
Basophils Relative: 1.1 %
Eosinophils Absolute: 193 cells/uL (ref 15–500)
Eosinophils Relative: 3.5 %
HCT: 41.2 % (ref 35.0–45.0)
Hemoglobin: 13.6 g/dL (ref 11.7–15.5)
Lymphs Abs: 1760 cells/uL (ref 850–3900)
MCH: 30.6 pg (ref 27.0–33.0)
MCHC: 33 g/dL (ref 32.0–36.0)
MCV: 92.6 fL (ref 80.0–100.0)
MPV: 9.4 fL (ref 7.5–12.5)
Monocytes Relative: 9.1 %
Neutro Abs: 2987 cells/uL (ref 1500–7800)
Neutrophils Relative %: 54.3 %
Platelets: 332 10*3/uL (ref 140–400)
RBC: 4.45 10*6/uL (ref 3.80–5.10)
RDW: 12.4 % (ref 11.0–15.0)
Total Lymphocyte: 32 %
WBC: 5.5 10*3/uL (ref 3.8–10.8)

## 2020-09-15 LAB — TEST AUTHORIZATION

## 2020-09-15 LAB — TSH: TSH: 7.81 mIU/L — ABNORMAL HIGH (ref 0.40–4.50)

## 2020-09-15 LAB — T4, FREE: Free T4: 1.1 ng/dL (ref 0.8–1.8)

## 2020-09-19 ENCOUNTER — Ambulatory Visit (INDEPENDENT_AMBULATORY_CARE_PROVIDER_SITE_OTHER): Payer: Medicare Other | Admitting: Osteopathic Medicine

## 2020-09-19 ENCOUNTER — Encounter: Payer: Self-pay | Admitting: Osteopathic Medicine

## 2020-09-19 ENCOUNTER — Other Ambulatory Visit: Payer: Self-pay

## 2020-09-19 VITALS — BP 114/62 | HR 70 | Temp 98.6°F | Wt 156.1 lb

## 2020-09-19 DIAGNOSIS — Z0000 Encounter for general adult medical examination without abnormal findings: Secondary | ICD-10-CM

## 2020-09-19 DIAGNOSIS — Z23 Encounter for immunization: Secondary | ICD-10-CM | POA: Diagnosis not present

## 2020-09-19 DIAGNOSIS — R7989 Other specified abnormal findings of blood chemistry: Secondary | ICD-10-CM

## 2020-09-19 DIAGNOSIS — E782 Mixed hyperlipidemia: Secondary | ICD-10-CM

## 2020-09-19 NOTE — Patient Instructions (Addendum)
Jamie [name] , Thank you for taking time to come for your Medicare Wellness Visit. I appreciate your ongoing commitment to your health goals. Please review the following plan we discussed and let me know if I can assist you in the future.   These are the goals we discussed: Goals   Continue with exercise and healthy diet  Reduce triglycerides and LDL      This is a list of the screening recommended for you and due dates:  Health Maintenance  Topic Date Due  . Pap Smear  5 years after last Pap. Can stop at age 61 if previous 2 Paps normal   . COVID-19 Vaccine (3 - Moderna risk 4-dose series) Booster not on file   . Pneumonia vaccine Done today, no additional pneumonia vaccines needed (unless guidelines changes)    . Tetanus Vaccine  10/29/2020  . Flu Shot  Fall 2022  . Mammogram  Surveillance per oncology   . Colon Cancer Screening  04/18/2027  . DEXA scan (bone density measurement)  12/2021  .  Hepatitis C screening: One time screening is recommended by Center for Disease Control  (CDC) for  adults born from [birth year] through 1966.   Completed    Shingles Vaccine Completed  . HIV Screening  Completed       Mediterranean Diet A Mediterranean diet refers to food and lifestyle choices that are based on the traditions of countries located on the [employer]. This way of eating has been shown to help prevent certain conditions and improve outcomes for people who have chronic diseases, like kidney disease and heart disease. What are tips for following this plan? Lifestyle  Cook and eat meals together with your family, when possible.  Drink enough fluid to keep your urine clear or pale yellow.  Be physically active every day. This includes: ? Aerobic exercise like running or swimming. ? Leisure activities like gardening, walking, or housework.  Get 7-8 hours of sleep each night.  If recommended by your health care provider, drink red wine in moderation. This means 1 glass a day  for nonpregnant women and 2 glasses a day for men. A glass of wine equals 5 oz (150 mL). Reading food labels  Check the serving size of packaged foods. For foods such as rice and pasta, the serving size refers to the amount of cooked product, not dry.  Check the total fat in packaged foods. Avoid foods that have saturated fat or trans fats.  Check the ingredients list for added sugars, such as corn syrup.   Shopping  At the grocery store, buy most of your food from the areas near the walls of the store. This includes: ? Fresh fruits and vegetables (produce). ? Grains, beans, nuts, and seeds. Some of these may be available in unpackaged forms or large amounts (in bulk). ? Fresh seafood. ? Poultry and eggs. ? Low-fat dairy products.  Buy whole ingredients instead of prepackaged foods.  Buy fresh fruits and vegetables in-season from local farmers markets.  Buy frozen fruits and vegetables in resealable bags.  If you do not have access to quality fresh seafood, buy precooked frozen shrimp or canned fish, such as tuna, salmon, or sardines.  Buy small amounts of raw or cooked vegetables, salads, or olives from the deli or salad bar at your store.  Stock your pantry so you always have certain foods on hand, such as olive oil, canned tuna, canned tomatoes, rice, pasta, and beans. Cooking  Cook foods with  extra-virgin olive oil instead of using butter or other vegetable oils.  Have meat as a side dish, and have vegetables or grains as your main dish. This means having meat in small portions or adding small amounts of meat to foods like pasta or stew.  Use beans or vegetables instead of meat in common dishes like chili or lasagna.  Experiment with different cooking methods. Try roasting or broiling vegetables instead of steaming or sauteing them.  Add frozen vegetables to soups, stews, pasta, or rice.  Add nuts or seeds for added healthy fat at each meal. You can add these to yogurt,  salads, or vegetable dishes.  Marinate fish or vegetables using olive oil, lemon juice, garlic, and fresh herbs. Meal planning  Plan to eat 1 vegetarian meal one day each week. Try to work up to 2 vegetarian meals, if possible.  Eat seafood 2 or more times a week.  Have healthy snacks readily available, such as: ? Vegetable sticks with hummus. ? [place] yogurt. ? Fruit and nut trail mix.  Eat balanced meals throughout the week. This includes: ? Fruit: 2-3 servings a day ? Vegetables: 4-5 servings a day ? Low-fat dairy: 2 servings a day ? Fish, poultry, or lean meat: 1 serving a day ? Beans and legumes: 2 or more servings a week ? Nuts and seeds: 1-2 servings a day ? Whole grains: 6-8 servings a day ? Extra-virgin olive oil: 3-4 servings a day  Limit red meat and sweets to only a few servings a month   What are my food choices?  Mediterranean diet ? Recommended  Grains: Whole-grain pasta. Brown rice. Bulgar wheat. Polenta. Couscous. Whole-wheat bread. [place].  Vegetables: Artichokes. Beets. Broccoli. Cabbage. Carrots. Eggplant. Green beans. Chard. Kale. Spinach. Onions. Leeks. Peas. Squash. Tomatoes. Peppers. Radishes.  Fruits: Apples. Apricots. Avocado. Berries. Bananas. Cherries. Dates. Figs. Grapes. Lemons. Melon. Oranges. Peaches. Plums. Pomegranate.  Meats and other protein foods: Beans. Almonds. Sunflower seeds. Pine nuts. Peanuts. [place]. Salmon. Scallops. Shrimp. [place]. Tilapia. Clams. Oysters. Eggs.  Dairy: Low-fat milk. Cheese. Greek yogurt.  Beverages: Water. Red wine. Herbal tea.  Fats and oils: Extra virgin olive oil. Avocado oil. Grape seed oil.  Sweets and desserts: [place] yogurt with honey. Baked apples. Poached pears. Trail mix.  Seasoning and other foods: Basil. Cilantro. Coriander. Cumin. Mint. Parsley. Sage. Rosemary. Tarragon. Garlic. Oregano. Thyme. Pepper. Balsalmic vinegar. Tahini. Hummus. Tomato sauce. Olives. Mushrooms. ? Limit  these  Grains: Prepackaged pasta or rice dishes. Prepackaged cereal with added sugar.  Vegetables: Deep fried potatoes (french fries).  Fruits: Fruit canned in syrup.  Meats and other protein foods: Beef. Pork. Lamb. Poultry with skin. Hot dogs. Berniece Salines.  Dairy: Ice cream. Sour cream. Whole milk.  Beverages: Juice. Sugar-sweetened soft drinks. Beer. Liquor and spirits.  Fats and oils: Butter. Canola oil. Vegetable oil. Beef fat (tallow). Lard.  Sweets and desserts: Cookies. Cakes. Pies. Candy.  Seasoning and other foods: Mayonnaise. Premade sauces and marinades. The items listed may not be a complete list. Talk with your dietitian about what dietary choices are right for you. Summary  The Mediterranean diet includes both food and lifestyle choices.  Eat a variety of fresh fruits and vegetables, beans, nuts, seeds, and whole grains.  Limit the amount of red meat and sweets that you eat.  Talk with your health care provider about whether it is safe for you to drink red wine in moderation. This means 1 glass a day for nonpregnant women and 2 glasses a  day for men. A glass of wine equals 5 oz (150 mL). This information is not intended to replace advice given to you by your health care provider. Make sure you discuss any questions you have with your health care provider. Document Revised: 01/05/2016 Document Reviewed: 12/29/2015 Elsevier Patient Education  [place].     Preventing High Cholesterol Cholesterol is a white, waxy substance similar to fat that the human body needs to help build cells. The liver makes all the cholesterol that a person's body needs. Having high cholesterol (hypercholesterolemia) increases your risk for heart disease and stroke. Extra or excess cholesterol comes from the food that you eat. High cholesterol can often be prevented with diet and lifestyle changes. If you already have high cholesterol, you can control it with diet, lifestyle changes, and  medicines. How can high cholesterol affect me? If you have high cholesterol, fatty deposits (plaques) may build up on the walls of your blood vessels. The blood vessels that carry blood away from your heart are called arteries. Plaques make the arteries narrower and stiffer. This in turn can:  Restrict or block blood flow and cause blood clots to form.  Increase your risk for heart attack and stroke. What can increase my risk for high cholesterol? This condition is more likely to develop in people who:  Eat foods that are high in saturated fat or cholesterol. Saturated fat is mostly found in foods that come from animal sources.  Are overweight.  Are not getting enough exercise.  Have a family history of high cholesterol (familial hypercholesterolemia). What actions can I take to prevent this? Nutrition  Eat less saturated fat.  Avoid trans fats (partially hydrogenated oils). These are often found in margarine and in some baked goods, fried foods, and snacks bought in packages.  Avoid precooked or cured meat, such as bacon, sausages, or meat loaves.  Avoid foods and drinks that have added sugars.  Eat more fruits, vegetables, and whole grains.  Choose healthy sources of protein, such as fish, poultry, lean cuts of red meat, beans, peas, lentils, and nuts.  Choose healthy sources of fat, such as: ? Nuts. ? Vegetable oils, especially olive oil. ? Fish that have healthy fats, such as omega-3 fatty acids. These fish include mackerel or salmon.   Lifestyle  Lose weight if you are overweight. Maintaining a healthy body mass index (BMI) can help prevent or control high cholesterol. It can also lower your risk for diabetes and high blood pressure. Ask your health care provider to help you with a diet and exercise plan to lose weight safely.  Do not use any products that contain nicotine or tobacco, such as cigarettes, e-cigarettes, and chewing tobacco. If you need help quitting, ask  your health care provider. Alcohol use  Do not drink alcohol if: ? Your health care provider tells you not to drink. ? You are pregnant, may be pregnant, or are planning to become pregnant.  If you drink alcohol: ? Limit how much you use to:  0-1 drink a day for women.  0-2 drinks a day for men. ? Be aware of how much alcohol is in your drink. In the [place] one drink equals one 12 oz bottle of beer (355 mL), one 5 oz glass of wine (148 mL), or one 1 oz glass of hard liquor (44 mL). Activity  Get enough exercise. Do exercises as told by your health care provider.  Each week, do at least 150 minutes of exercise that  takes a medium level of effort (moderate-intensity exercise). This kind of exercise: ? Makes your heart beat faster while allowing you to still be able to talk. ? Can be done in short sessions several times a day or longer sessions a few times a week. For example, on 5 days each week, you could walk fast or ride your bike 3 times a day for 10 minutes each time.   Medicines  Your health care provider may recommend medicines to help lower cholesterol. This may be a medicine to lower the amount of cholesterol that your liver makes. You may need medicine if: ? Diet and lifestyle changes have not lowered your cholesterol enough. ? You have high cholesterol and other risk factors for heart disease or stroke.  Take over-the-counter and prescription medicines only as told by your health care provider. General information  Manage your risk factors for high cholesterol. Talk with your health care provider about all your risk factors and how to lower your risk.  Manage other conditions that you have, such as diabetes or high blood pressure (hypertension).  Have blood tests to check your cholesterol levels at regular points in time as told by your health care provider.  Keep all follow-up visits as told by your health care provider. This is important. Where to find more  information  American Heart Association: www.heart.org  National Heart, Lung, and Blood Institute: https://wilson-eaton.com/ Summary  High cholesterol increases your risk for heart disease and stroke. By keeping your cholesterol level low, you can reduce your risk for these conditions.  High cholesterol can often be prevented with diet and lifestyle changes.  Work with your health care provider to manage your risk factors, and have your blood tested regularly. This information is not intended to replace advice given to you by your health care provider. Make sure you discuss any questions you have with your health care provider. Document Revised: 02/17/2019 Document Reviewed: 02/17/2019 Elsevier Patient Education  [place].

## 2020-09-19 NOTE — Progress Notes (Signed)
HPI: Jamie [name] is a 65 y.o. female  who presents to [facility] today, 09/19/20,  for Medicare Annual Wellness Exam  Patient presents for annual physical/Medicare wellness exam. No other complaints today.   Past medical, surgical, social and family history reviewed:  Patient Active Problem List   Diagnosis Date Noted  . Family history of osteoporosis 06/12/2018  . History of breast cancer 06/12/2018  . Advance directive discussed with patient 04/03/2018  . Pap smear of cervix declined 04/03/2018  . Lumbago 08/16/2017  . Basal cell carcinoma 07/17/2017  . Vaginismus 07/17/2017  . Abnormal TSH 03/28/2017  . Diarrhea 11/05/2016  . Port catheter in place 10/23/2016  . Genetic testing 08/15/2016  . Malignant neoplasm of upper-outer quadrant of left breast in female, estrogen receptor positive ([place]) 07/10/2016  . Allergic rhinitis 06/13/2016  . History of basal cell carcinoma 06/13/2016  . Eczema 06/13/2016  . Hyperlipemia 06/13/2016  . History of colon polyps 06/13/2016  . 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 RLS (restless legs syndrome) 06/13/2016  . Hypothyroidism 07/20/2013  . Bunion 01/21/2013  . Osteopenia 01/20/2013    Past Surgical History:  Procedure Laterality Date  . BONE SPUR Bilateral 2001 AND 1988  . BREAST BIOPSY    . BREAST LUMPECTOMY Left    07/2016  . BREAST LUMPECTOMY WITH RADIOACTIVE SEED AND SENTINEL LYMPH NODE BIOPSY Left 07/26/2016   Procedure: BREAST LUMPECTOMY WITH RADIOACTIVE SEED AND SENTINEL LYMPH NODE BIOPSY;  Surgeon: [name], MD;  Location: [facility];  Service: General;  Laterality: Left;  . BUNIONECTOMY Bilateral 02/2012  . COLONOSCOPY W/ POLYPECTOMY  08/2007  . DILATION AND CURETTAGE OF UTERUS  2002  . MOHS SURGERY  2010  . PORT-A-CATH REMOVAL N/A 07/18/2017   Procedure: REMOVAL PORT-A-CATH;  Surgeon: [name], MD;  Location: [facility];  Service: General;  Laterality: N/A;  .  PORTACATH PLACEMENT Right 07/26/2016   Procedure: INSERTION PORT-A-CATH;  Surgeon: [name], MD;  Location: [facility];  Service: General;  Laterality: Right;    Social History   Socioeconomic History  . Marital status: Married    Spouse name: Not on file  . Number of children: 1  . Years of education: Not on file  . Highest education level: Not on file  Occupational History  . Occupation: retired  Tobacco Use  . Smoking status: Never Smoker  . Smokeless tobacco: Never Used  Vaping Use  . Vaping Use: Never used  Substance and Sexual Activity  . Alcohol use: Yes    Comment: 1 drink/week, wine or beer  . Drug use: No  . Sexual activity: Yes    Birth control/protection: Post-menopausal  Other Topics Concern  . Not on file  Social History Narrative   I biological son and 1 adopted daughter   Social Determinants of Health   Financial Resource Strain: Not on file  Food Insecurity: Not on file  Transportation Needs: Not on file  Physical Activity: Not on file  Stress: Not on file  Social Connections: Not on file  Intimate Partner Violence: Not on file    Family History  Problem Relation Age of Onset  . Ovarian cancer Mother 87  . Hypertension Father   . Stroke Father   . Heart disease Father   . Breast cancer Maternal Aunt 77       recurred at 54  . Heart disease Paternal Grandmother   . Osteoporosis Sister   . Liver cancer  Maternal Uncle 16     Current medication list and allergy/intolerance information reviewed:    Outpatient Encounter Medications as of 09/19/2020  Medication Sig  . anastrozole (ARIMIDEX) 1 MG tablet Take 1 tablet (1 mg total) by mouth daily.  [name] BIOTIN PO Take 1 tablet by mouth daily.  . calcium-vitamin D (OSCAL WITH D) 500-200 MG-UNIT tablet Take 1 tablet by mouth 2 (two) times daily.  . cetirizine (ZYRTEC) 10 MG tablet Take 10 mg by mouth daily as needed (for allergies--Spring/Summer).   . Cholecalciferol (VITAMIN D3) 1000  units CAPS Take 1,000 Units by mouth daily.   . Cyanocobalamin (B-12) 2500 MCG TABS Place 2,500 mcg under the tongue daily.   . diphenhydrAMINE (BENADRYL) 25 MG tablet Take 12.5 mg by mouth at bedtime as needed for sleep.  [name] gentamicin cream (GARAMYCIN) 0.1 % Apply 1 application topically 3 (three) times daily.  . Multiple Vitamin (MULTIVITAMIN) tablet Take 1 tablet by mouth daily.  . Red Yeast Rice Extract (RED YEAST RICE PO) Take 1 tablet by mouth 3 (three) times a week.  . urea (GORDONS UREA) 40 % ointment Apply topically as needed.   No facility-administered encounter medications on file as of 09/19/2020.    Allergies  Allergen Reactions  . Adhesive [Tape] Rash  . Codeine Nausea Only and Other (See Comments)    Dizzy  . Sulfamethoxazole Nausea And Vomiting       Review of Systems: Review of Systems - negative    Medicare Wellness Questionnaire  Are there smokers in your home (other than you)? no  Depression Screen (Note: if answer to either of the following is "Yes", a more complete depression screening is indicated)   Q1: Over the past two weeks, have you felt down, depressed or hopeless? no  Q2: Over the past two weeks, have you felt little interest or pleasure in doing things? no  Have you lost interest or pleasure in daily life? no  Do you often feel hopeless? no  Do you cry easily over simple problems? no  Activities of Daily Living In your present state of health, do you have any difficulty performing the following activities?:  Driving? no Managing money?  no Feeding yourself? no Getting from bed to chair? no Climbing a flight of stairs? no Preparing food and eating?: no Bathing or showering? no Getting dressed: no Getting to the toilet? no Using the toilet: no Moving around from place to place: no In the past year have you fallen or had a near fall?: no  Hearing Difficulties:  Do you often ask people to speak up or repeat themselves? no Do you  experience ringing or noises in your ears? no  Do you have difficulty understanding soft or whispered voices? no  Memory Difficulties:  Do you feel that you have a problem with memory? no  Do you often misplace items? no  Do you feel safe at home?  yes  Sexual Health:   Are you sexually active?  Yes  Do you have more than one partner?  No  Advanced Directives:   Advanced directives discussed: has an advanced directive - a copy HAS NOT been provided.  Additional information provided: no  Risk Factors  Current exercise habits: [employer] (yoga, weights, aerobics, [place])  Dietary issues discussed:no concerns   Cardiac risk factors: positive family history   Exam:  BP 114/62 (BP Location: Right Arm, Patient Position: Sitting, Cuff Size: Normal)   Pulse 70   Temp 98.6 F (37 C) (  Oral)   Wt 156 lb 1.9 oz (70.8 kg)   BMI 26.59 kg/m  Vision by Snellen chart: right eye:see nurse notes, left eye:see nurse notes  Constitutional: VS see above. General Appearance: alert, well-developed, well-nourished, NAD  Ears, Nose, Mouth, Throat: MMM  Neck: No masses, trachea midline.   Respiratory: Normal respiratory effort. no wheeze, no rhonchi, no rales  Cardiovascular:No lower extremity edema.   Musculoskeletal: Gait normal. No clubbing/cyanosis of digits.   Neurological: Normal balance/coordination. No tremor. Recalls 3 objects and able to read face of watch with correct time.   Skin: warm, dry, intact. No rash/ulcer.   Psychiatric: Normal judgment/insight. Normal mood and affect. Oriented x3.     ASSESSMENT/PLAN:   Encounter for Medicare annual wellness exam  Medicare annual wellness visit, initial  Need for pneumococcal vaccine - Plan: Pneumococcal conjugate vaccine 20-valent (Prevnar 20)  Abnormal TSH - Plan: TSH, T4, free, Lipid panel  Mixed hyperlipidemia - Plan: TSH, T4, free, Lipid panel  CANCER SCREENING  Lung - USPSTF: 55-80yo w/ 30 py hx unless quit w/in 43yr - does  not need   Colon - does not need  Prostate - does not need  Breast - surveillance per oncology   Cervical - need records of previous  OTHER DISEASE SCREENING  Lipid - does not need  DM2 - does not need  AAA - female 87-75yo ever smoked - does not need  Osteoporosis - women 65yo+, men 66yo+ - does not need INFECTIOUS DISEASE SCREENING  HIV - does not need  GC/CT - does not need  HepC -does not need  TB - does not need ADULT VACCINATION  Influenza - annual vaccine recommended  Td - booster every 10 years - does not need  Zoster - option at 50, yes at 44+ - does not need  PCV13 or 20 - needs --> done today   PPSV23 - does not need Immunization History  Administered Date(s) Administered  . Fluad Quad(high Dose 65+) 02/04/2020  . Influenza,inj,Quad PF,6+ Mos 02/18/2014, 02/13/2017, 01/14/2018, 02/10/2019  . Influenza-Unspecified 03/24/2009, 03/08/2015  . Moderna Sars-Covid-2 Vaccination 06/15/2019, 07/13/2019  . PNEUMOCOCCAL CONJUGATE-20 09/19/2020  . Td 05/17/1999  . Tdap 10/30/2010  . Zoster 02/15/2015  . Zoster Recombinat (Shingrix) 05/01/2018, 07/11/2018   OTHER  Fall - exercise and Vit D age 14+ - does not need  Advanced Directives -  Discussed as above  The 10-year ASCVD risk score Mikey Bussing DC Jr., et al., 2013) is: 4.9%   Values used to calculate the score:     Age: 55 years     Sex: Female     Is Non-Hispanic African American: No     Diabetic: No     Tobacco smoker: No     Systolic Blood Pressure: 161 mmHg     Is BP treated: No     HDL Cholesterol: 65 mg/dL     Total Cholesterol: 273 mg/dL  During the course of the visit the patient was educated and counseled about appropriate screening and preventive services as noted above.   Patient Instructions (the written plan) was given to the patient.  Medicare Attestation I have personally reviewed: The patient's medical and social history Their use of alcohol, tobacco or illicit drugs Their current  medications and supplements The patient's functional ability including ADLs,fall risks, home safety risks, cognitive, and hearing and visual impairment Diet and physical activities Evidence for depression or mood disorders  The patient's weight, height, BMI, and visual acuity have been recorded in  the chart.  I have made referrals, counseling, and provided education to the patient based on review of the above and I have provided the patient with a written personalized care plan for preventive services.     Sunnie Nielsen, DO   09/22/20   Visit summary with medication list and pertinent instructions was printed for patient to review. All questions at time of visit were answered - patient instructed to contact office with any additional concerns. ER/RTC precautions were reviewed with the patient. Follow-up plan: Return in about 6 weeks (around 10/31/2020) for LAB VISIT ONLY -  RECHECK THYROID LEVELS AND CHOLESTEROL .

## 2020-09-22 ENCOUNTER — Encounter: Payer: Self-pay | Admitting: Osteopathic Medicine

## 2020-11-18 ENCOUNTER — Telehealth: Payer: Self-pay

## 2020-11-18 NOTE — Telephone Encounter (Signed)
Pt LVM. Pt has an annual exam on 10-13-20. Pt states it was coded incorrectly and Medicare would not pay for the visit. The exam needs to be coded G0402. Please call pt to let her know what if anything she needs to do.  [phone] Ottis Stain, CMA

## 2020-11-18 NOTE — Addendum Note (Signed)
Addended by: Maryla Morrow on: 11/18/2020 02:07 PM   Modules accepted: Level of Service

## 2020-11-18 NOTE — Telephone Encounter (Signed)
Addended, nothing else she should need to do

## 2020-11-23 ENCOUNTER — Telehealth: Payer: Self-pay

## 2020-11-23 NOTE — Telephone Encounter (Signed)
Spoke with patient and informed her the code for the 09/19/2020 visit has been changed and that she would have to contact the [facility] department to have them reprocess the claim. Billing number was provided to her, [phone].

## 2020-12-15 ENCOUNTER — Telehealth: Payer: Self-pay

## 2020-12-15 NOTE — Telephone Encounter (Signed)
Pt called and LVM stating she is going to be coming in on 12/22/20 to have labs drawn. She wants to make sure that the orders are in the system because last time she had to wait over an hour to get the labs done. I looked in her chart and see that there are orders for TSH, Free T4, and lipid panel in the system ready for collection. Pt aware via voicemail that we have a phlebotomist here in our office now and that she can come in fasting either to our office or the lab downstairs to have the blood drawn. Instructed her to call back with any further questions or concerns

## 2021-01-09 LAB — LIPID PANEL
Cholesterol: 264 mg/dL — ABNORMAL HIGH (ref <200)
HDL: 55 mg/dL (ref >=50)
LDL Cholesterol (Calc): 182 mg/dL (calc) — ABNORMAL HIGH
Non-HDL Cholesterol (Calc): 209 mg/dL (calc) — ABNORMAL HIGH (ref <130)
Total CHOL/HDL Ratio: 4.8 (calc) (ref <5.0)
Triglycerides: 132 mg/dL (ref <150)

## 2021-01-09 LAB — TSH: TSH: 7.41 mIU/L — ABNORMAL HIGH (ref 0.40–4.50)

## 2021-01-09 LAB — T4, FREE: Free T4: 1.1 ng/dL (ref 0.8–1.8)

## 2021-01-10 ENCOUNTER — Encounter: Payer: Self-pay | Admitting: Osteopathic Medicine

## 2021-02-14 ENCOUNTER — Other Ambulatory Visit: Payer: Self-pay

## 2021-02-14 ENCOUNTER — Other Ambulatory Visit: Payer: Self-pay | Admitting: Oncology

## 2021-02-14 ENCOUNTER — Ambulatory Visit
Admission: RE | Admit: 2021-02-14 | Discharge: 2021-02-14 | Disposition: A | Discharge status: Home / Self Care | Payer: Medicare Other | Source: Ambulatory Visit | Attending: Oncology | Admitting: Oncology

## 2021-02-14 DIAGNOSIS — R921 Mammographic calcification found on diagnostic imaging of breast: Secondary | ICD-10-CM

## 2021-02-20 ENCOUNTER — Other Ambulatory Visit: Payer: Self-pay | Admitting: Oncology

## 2021-02-20 ENCOUNTER — Other Ambulatory Visit: Payer: Medicare Other

## 2021-02-20 ENCOUNTER — Ambulatory Visit: Payer: Medicare Other | Admitting: Oncology

## 2021-03-06 ENCOUNTER — Ambulatory Visit: Payer: Medicare Other | Admitting: Oncology

## 2021-03-06 ENCOUNTER — Other Ambulatory Visit: Payer: Medicare Other

## 2021-03-23 ENCOUNTER — Ambulatory Visit (INDEPENDENT_AMBULATORY_CARE_PROVIDER_SITE_OTHER): Payer: Medicare Other

## 2021-03-23 ENCOUNTER — Other Ambulatory Visit: Payer: Self-pay

## 2021-03-23 ENCOUNTER — Ambulatory Visit (INDEPENDENT_AMBULATORY_CARE_PROVIDER_SITE_OTHER): Payer: Medicare Other | Admitting: Sports Medicine

## 2021-03-23 DIAGNOSIS — M1612 Unilateral primary osteoarthritis, left hip: Secondary | ICD-10-CM

## 2021-03-23 DIAGNOSIS — Z23 Encounter for immunization: Secondary | ICD-10-CM | POA: Diagnosis not present

## 2021-03-23 MED ORDER — ACETAMINOPHEN ER 650 MG PO TBCR: 650.0000 mg | EXTENDED_RELEASE_TABLET | Freq: Three times a day (TID) | ORAL | 3 refills | Status: DC | PRN

## 2021-03-23 MED ORDER — CELECOXIB 200 MG PO CAPS: ORAL_CAPSULE | ORAL | 2 refills | Status: DC

## 2021-03-23 NOTE — Addendum Note (Signed)
Addended by: Gust Brooms on: 03/23/2021 11:55 AM   Modules accepted: Orders

## 2021-03-23 NOTE — Progress Notes (Signed)
    Procedures performed today:    None.  Independent interpretation of notes and tests performed by another provider:   None.  Brief History, Exam, Impression, and Recommendations:    Primary osteoarthritis of left hip This is a very pleasant 65 year old female, she has a year-long history of pain in her left hip, left groin. Moderate gelling. Limited internal rotation with reproduction of pain. We discussed the pathophysiology, and treatment plan. We will start with arthritis strength Tylenol, celecoxib, updated x-rays, hip conditioning exercises. Return to see me in 6 weeks, injection if no better. We also discussed additional modalities in the future such as PRP injections and arthroplasty.    ___________________________________________ Gwen Her. Dianah Field, M.D., ABFM., CAQSM. Primary Care and Reserve Instructor of [employer] [facility]

## 2021-03-23 NOTE — Assessment & Plan Note (Signed)
This is a very pleasant 65 year old female, she has a year-long history of pain in her left hip, left groin. Moderate gelling. Limited internal rotation with reproduction of pain. We discussed the pathophysiology, and treatment plan. We will start with arthritis strength Tylenol, celecoxib, updated x-rays, hip conditioning exercises. Return to see me in 6 weeks, injection if no better. We also discussed additional modalities in the future such as PRP injections and arthroplasty.

## 2021-03-31 ENCOUNTER — Other Ambulatory Visit: Payer: Self-pay | Admitting: *Deleted

## 2021-03-31 DIAGNOSIS — C50412 Malignant neoplasm of upper-outer quadrant of left female breast: Secondary | ICD-10-CM

## 2021-04-02 NOTE — Progress Notes (Addendum)
Jamie [name]  Telephone:([phone]) [phone] [phone]) [phone]    ID: Jamie [name] DOB: 1955-12-04  MR#: [id number]  DZH#:[id number]  Patient Care Team: Emeterio Reeve, DO as PCP - General (Osteopathic Medicine) [name], MD as Consulting Physician (General Surgery) Shamya Macfadden, [name], MD as Consulting Physician (Oncology) [name], MD as Consulting Physician (Radiation Oncology) [name], MD as Referring Physician Armbruster, Carlota Raspberry, MD as Consulting Physician (Gastroenterology) Delice Bison, Charlestine Massed, NP as Nurse Practitioner (Hematology and Oncology) OTHER MD:  CHIEF COMPLAINT: 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   She continues on anastrozole.  Hot flashes and vaginal dryness are not a major concern  Since her last visit, she underwent bilateral diagnostic mammography with tomography at [facility] on 07/29/2020 showing: breast density category C; likely benign 3 mm group of dystrophic calcifications in left lumpectomy site; no evidence of malignancy in either breast.   She returned for short-term follow up of the calcifications on 02/14/2021. She underwent left diagnostic mammogram showing: breast density category C; stable, probably-benign fat necrosis and calcifications at lumpectomy scar.  She is scheduled for her next annual mammography on 08/01/2021.  She has a MUTYH mutation and has more frequent colonoscopies.  Her next colonoscopy is due with Dr. Havery Moros in 2023.   REVIEW OF SYSTEMS: Jamie [name] has pain in the left breast particularly since she had her mammogram.  She describes it as sore and like she did when she used to have a period.  She is also having left hip issues which are being followed through her primary care physician.  She is concerned that she was recalled for  mammography and was unsure if she should have had a biopsy or not although she says the mammographer was reassuring regarding the nature of what they found namely fat necrosis aside from these issues a detailed review of systems today was stable   COVID 19 VACCINATION STATUS: Moderna x2   BREAST CANCER HISTORY: From the original intake note:  Jamie [name] had routine bilateral screening mammography with tomography at the [facility] 06/22/2016. This showed a possible mass in the left breast. On 07/02/2016 she underwent left diagnostic mammography with tomography and left breast ultrasonography. The breast density was category C. In the left breast upper outer quadrant there was a microlobulated mass which was not directly palpable, although there was slight thickening in the left breast 1:00 position. Targeted ultrasonography confirmed a solid hypoechoic microlobulated mass in the left breast 1:00 radiant 4 cm from the nipple, measuring 0.8 cm.  On 07/03/2016 Jamie [name] underwent biopsy of the left breast mass in question, and this showed ([id number]) invasive ductal carcinoma, grade 2 or 3, with extracellular mucin, estrogen receptor 5% positive with weak staining intensity, progesterone receptor negative, with an MIB-1 of 50%, and HER-2 amplified, the signals ratio being 5.71 and the number per cell 14.55.  Her subsequent history is as detailed below   PAST MEDICAL HISTORY: Past Medical History:  Diagnosis Date   Allergy    Anemia    Arthritis    feet   Basal cell carcinoma    Breast cancer ([place]) 06/2016   left breast   Colon polyps    Eczema    Genetic testing 08/15/2016   Jamie [name] underwent genetic testing for hereditary cancer syndrome through [facility]'s 43-gene Common Hereditary Cancers Panel. Jamie [name] testing revealed a single pathogenic  mutation in MUTYH and a variant of uncertain significance (VUS) in SDHB. Result report is dated 08/15/2016. Please see genetic counseling  documentation from 08/17/2016 for further discussion.   History of radiation therapy 01/02/17-01/31/17   left breast was treated to 42.72 Gy in 16 fractions, left breast boost 10 Gy in 5 fractions   Personal history of chemotherapy    Personal history of radiation therapy    PONV (postoperative nausea and vomiting)     PAST SURGICAL HISTORY: Past Surgical History:  Procedure Laterality Date   BONE SPUR Bilateral 2001 AND 1988   BREAST BIOPSY     BREAST LUMPECTOMY Left    07/2016   BREAST LUMPECTOMY WITH RADIOACTIVE SEED AND SENTINEL LYMPH NODE BIOPSY Left 07/26/2016   Procedure: BREAST LUMPECTOMY WITH RADIOACTIVE SEED AND SENTINEL LYMPH NODE BIOPSY;  Surgeon: [name], MD;  Location: [facility];  Service: General;  Laterality: Left;   BUNIONECTOMY Bilateral 02/2012   COLONOSCOPY W/ POLYPECTOMY  08/2007   DILATION AND CURETTAGE OF UTERUS  2002   MOHS SURGERY  2010   PORT-A-CATH REMOVAL N/A 07/18/2017   Procedure: REMOVAL PORT-A-CATH;  Surgeon: [name], MD;  Location: [facility];  Service: General;  Laterality: N/A;   PORTACATH PLACEMENT Right 07/26/2016   Procedure: INSERTION PORT-A-CATH;  Surgeon: [name], MD;  Location: [facility];  Service: General;  Laterality: Right;    FAMILY HISTORY Family History  Problem Relation Age of Onset   Ovarian cancer Mother 61   Hypertension Father    Stroke Father    Heart disease Father    Breast cancer Maternal Aunt 77       recurred at 81   Heart disease Paternal Grandmother    Osteoporosis Sister    Liver cancer Maternal Uncle 58  The patient's father died at age 56, with some form of skin cancer, most likely melanoma. The patient's mother died at the age of 102 with ovarian cancer, which had been diagnosed a few months prior. The patient had no brothers, 1 sister. A maternal aunt was diagnosed with breast cancer at age 21, recurrent age 91.   GYNECOLOGIC HISTORY:  No LMP recorded. Patient is  postmenopausal. Menarche age 26, the patient is GX P0. She stopped having periods approximately age 14. 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 Her husband [name] worked as a Dance movement psychotherapist for a [employer]. Their last name is pronounced foh-TEE-ah and they tell me it means "burning" in [place]. Their children are adopted. [name] lives in [place] and works in Press photographer, and [place] lives in [place] and is an Scientist, water quality. The patient has 3 grandchildren. She is not a Ambulance person.    ADVANCED DIRECTIVES: In place   HEALTH MAINTENANCE: Social History   Tobacco Use   Smoking status: Never   Smokeless tobacco: Never  Vaping Use   Vaping Use: Never used  Substance Use Topics   Alcohol use: Yes    Comment: 1 drink/week, wine or beer   Drug use: No     Colonoscopy:2009  HEN:IDPOEU 2018  Bone density: 02/26/2017 T-score of -2.0 at the left femur neck    Allergies  Allergen Reactions   Adhesive [Tape] Rash   Codeine Nausea Only and Other (See Comments)    Dizzy   Sulfamethoxazole Nausea And Vomiting    Current Outpatient Medications  Medication Sig Dispense Refill  acetaminophen (TYLENOL) 650 MG CR tablet Take 1 tablet (650 mg total) by mouth every 8 (eight) hours as needed for pain. 90 tablet 3   anastrozole (ARIMIDEX) 1 MG tablet TAKE 1 TABLET DAILY 90 tablet 3   BIOTIN PO Take 1 tablet by mouth daily.     calcium-vitamin D (OSCAL WITH D) 500-200 MG-UNIT tablet Take 1 tablet by mouth 2 (two) times daily.     celecoxib (CELEBREX) 200 MG capsule One to 2 tablets by mouth daily as needed for pain. 60 capsule 2   cetirizine (ZYRTEC) 10 MG tablet Take 10 mg by mouth daily as needed (for allergies--Spring/Summer).      Cholecalciferol (VITAMIN D3) 1000 units CAPS Take 1,000 Units by mouth daily.      Cyanocobalamin (B-12) 2500 MCG TABS Place 2,500 mcg under the tongue daily.       diphenhydrAMINE (BENADRYL) 25 MG tablet Take 12.5 mg by mouth at bedtime as needed for sleep.     gentamicin cream (GARAMYCIN) 0.1 % Apply 1 application topically 3 (three) times daily. 15 g 0   Multiple Vitamin (MULTIVITAMIN) tablet Take 1 tablet by mouth daily.     Red Yeast Rice Extract (RED YEAST RICE PO) Take 1 tablet by mouth 3 (three) times a week.     urea (GORDONS UREA) 40 % ointment Apply topically as needed. 30 g 0   No current facility-administered medications for this visit.    OBJECTIVE: White woman who appears stated Vitals:   04/03/21 1031  BP: 109/64  Pulse: 80  Resp: 18  Temp: 97.9 F (36.6 C)  SpO2: 99%     Body mass index is 26.64 kg/m.   Filed Weights   04/03/21 1031  Weight: 156 lb 6.4 oz (70.9 kg)    ECOG FS:1  Sclerae unicteric, EOMs intact Wearing a mask No cervical or supraclavicular adenopathy Lungs no rales or rhonchi Heart regular rate and rhythm Abd soft, nontender, positive bowel sounds MSK no focal spinal tenderness, no upper extremity lymphedema Neuro: nonfocal, well oriented, appropriate affect Breasts: The right breast is benign.  The left breast is status postlumpectomy and radiation.  It is minimally smaller than the right.  It is slightly sore to palpation and there is some induration in the upper outer quadrant which is likely the area of fat necrosis being followed by mammography.  Both axillae are benign.   LAB RESULTS:  CMP     Component Value Date/Time   NA 141 09/13/2020 0000   NA 140 05/15/2017 0920   K 4.2 09/13/2020 0000   K 4.3 05/15/2017 0920   CL 105 09/13/2020 0000   CO2 31 09/13/2020 0000   CO2 25 05/15/2017 0920   GLUCOSE 95 09/13/2020 0000   GLUCOSE 94 05/15/2017 0920   BUN 9 09/13/2020 0000   BUN 11.5 05/15/2017 0920   CREATININE 0.78 09/13/2020 0000   CREATININE 0.8 05/15/2017 0920   CALCIUM 9.6 09/13/2020 0000   CALCIUM 9.3 05/15/2017 0920   PROT 7.0 09/13/2020 0000   PROT 6.5 05/15/2017 0920    ALBUMIN 4.0 02/19/2020 0908   ALBUMIN 3.8 05/15/2017 0920   AST 18 09/13/2020 0000   AST 19 02/19/2020 0908   AST 17 05/15/2017 0920   ALT 15 09/13/2020 0000   ALT 16 02/19/2020 0908   ALT 10 05/15/2017 0920   ALKPHOS 65 02/19/2020 0908   ALKPHOS 53 05/15/2017 0920   BILITOT 0.4 09/13/2020 0000   BILITOT 0.3 02/19/2020 0908  BILITOT 0.36 05/15/2017 0920   GFRNONAA 80 09/13/2020 0000   GFRAA 92 09/13/2020 0000    INo results found for: SPEP, UPEP  Lab Results  Component Value Date   WBC 5.7 04/03/2021   NEUTROABS 3.2 04/03/2021   HGB 13.0 04/03/2021   HCT 38.2 04/03/2021   MCV 92.3 04/03/2021   PLT 304 04/03/2021      Chemistry      Component Value Date/Time   NA 141 09/13/2020 0000   NA 140 05/15/2017 0920   K 4.2 09/13/2020 0000   K 4.3 05/15/2017 0920   CL 105 09/13/2020 0000   CO2 31 09/13/2020 0000   CO2 25 05/15/2017 0920   BUN 9 09/13/2020 0000   BUN 11.5 05/15/2017 0920   CREATININE 0.78 09/13/2020 0000   CREATININE 0.8 05/15/2017 0920   GLU 73 08/02/2014 0000      Component Value Date/Time   CALCIUM 9.6 09/13/2020 0000   CALCIUM 9.3 05/15/2017 0920   ALKPHOS 65 02/19/2020 0908   ALKPHOS 53 05/15/2017 0920   AST 18 09/13/2020 0000   AST 19 02/19/2020 0908   AST 17 05/15/2017 0920   ALT 15 09/13/2020 0000   ALT 16 02/19/2020 0908   ALT 10 05/15/2017 0920   BILITOT 0.4 09/13/2020 0000   BILITOT 0.3 02/19/2020 0908   BILITOT 0.36 05/15/2017 0920       No results found for: LABCA2  No components found for: LABCA125  No results for input(s): INR in the last 168 hours.  Urinalysis    Component Value Date/Time   COLORURINE COLORLESS (A) 12/22/2016 2105   APPEARANCEUR CLEAR 12/22/2016 2105   LABSPEC 1.005 12/22/2016 2105   PHURINE 7.0 12/22/2016 2105   GLUCOSEU NEGATIVE 12/22/2016 2105   HGBUR NEGATIVE 12/22/2016 2105   BILIRUBINUR negative 08/16/2017 0855   KETONESUR NEGATIVE 12/22/2016 2105   PROTEINUR negative 08/16/2017 0855    PROTEINUR NEGATIVE 12/22/2016 2105   UROBILINOGEN 0.2 08/16/2017 0855   NITRITE negative 08/16/2017 0855   NITRITE NEGATIVE 12/22/2016 2105   LEUKOCYTESUR Negative 08/16/2017 0855    STUDIES: DG HIP UNILAT W OR W/O PELVIS 2-3 VIEWS LEFT  Result Date: 03/24/2021 CLINICAL DATA:  Left hip pain EXAM: DG HIP (WITH OR WITHOUT PELVIS) 2-3V LEFT COMPARISON:  08/16/2017 FINDINGS: No fracture or dislocation. Small marginal spurs from the acetabular and left femoral head. Eccentric benign-appearing sclerotic lesion in the proximal left femoral shaft is stable. IMPRESSION: No acute findings.  Mild degenerative spurring. Electronically Signed   By: Lucrezia Europe M.D.   On: 03/24/2021 09:19     ELIGIBLE FOR AVAILABLE RESEARCH PROTOCOL: no  ASSESSMENT: 65 y.o. Jamie [name], Jamie [name] woman status post biopsy of the left breast upper outer quadrant lesion 07/03/2016 showing a clinical T1b N0, stage 1B invasive ductal carcinoma, grade 2 or 3, estrogen receptor weakly positive at 5%, progesterone receptor negative, but HER-2 strongly amplified, with an MIB-1 of 50%.  (1) status post left lumpectomy with sentinel lymph node sampling 07/26/2016 for a pT1c pN0, stage IA invasive ductal carcinoma, grade 3, with extracellular mucin, and negative margins  (2) chemotherapy consisting of Paclitaxel weekly starting 09/18/2016, discontinued after one cycle due to peripheral neuropathy.   (a) Gemcitabine and Carboplatin started 10/02/2016, repeated days 1 and 8 of each 21 day cycle to a total of 8 doses (4 cycles)-- final dose 12/18/2016  (3) trastuzumab started 08/07/2016, completed 12 months on 07/17/2017  (a) baseline echocardiogram 07/25/2016 shows an ejection fraction of 60-65%  (b)  repeat echocardiogram 10/22/2016 shows stable ejection fraction  (c) echocardiogram January 24, 2017 found an ejection fraction in the 55-60% range.  (d) echocardiogram 05/02/2017 showed an ejection fraction of 55-60%  (e) final echocardiogram  on 08/06/2017 showed an ejection fraction in the 55-60% range  (4) adjuvant radiation 01/02/2017-01/31/2017:  Left breast was treated to 42.72 Gy in 16 fractions and boosted to 10 Gy in 5 fractions.  (5) anastrozole started February 28, 2017  (a) will avoid tamoxifen given the history of monoclonal allelic MUTYH mutation  (b) Bone density on 02/26/2017 with a  T-score of -2.0  Osteopenic at the left femur neck   (c) denosumab/Prolia started 10/29/2017, repeated every 6 months, last dose December 2019  (d) repeat bone density 01/07/2020 showed a T score of -2.0 (stable  (6) genetics testing 08/15/2016 through the [facility]'s 43-gene Common Hereditary Cancers Panel showed a pathogenic variant called, MUTYH, 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   (a) associated with increased colorectal cancer risk, possibly breast cancer (at least in [place])  (b) colonoscopy 09/11/2007/ Ezzie Dural  (c) colonoscopy 04/17/2017/ Armbruster-- next due 2023   PLAN: Aalyah is now 4-1/2 years out from definitive surgery for her breast cancer with no evidence of disease recurrence.  This is very favorable.  Her breast cancer was minimally estrogen receptor positive.  She is really taking antiestrogens chiefly for prevention.  Accordingly she will stop at 5 years, fall 2023, since we do not have data for continuing antiestrogens beyond 5 years when they are taking prophylactically.  She had many questions regarding her condition and I gave her a copy of her breast cancer history as above which answers most of those.  I reassured her that pain in the breast is not breast cancer it is mastalgia and we also discussed fat necrosis in detail.  I wrote her a prescription for additional breast in case that can make it left breast a little bit more comfortable.  She is scheduled for mammography in March.  She will return to see [place] in April and in general because she really does benefit from reassurance she probably should be seen  again in October or November 2023 and yearly thereafter through survivorship  Total encounter time 25 minutes.Sarajane Jews C. Maxemiliano Riel, MD 04/03/21 10:39 AM Medical Oncology and Hematology [facility] [name], [place] [address] Tel. [phone]    Fax. [phone]   I, Wilburn Mylar, am acting as scribe for Dr. [name]. Martavia Tye.  I, Lurline Del MD, have reviewed the above documentation for accuracy and completeness, and I agree with the above.    *Total Encounter Time as defined by the Centers for Medicare and Medicaid Services includes, in addition to the face-to-face time of a patient visit (documented in the note above) non-face-to-face time: obtaining and reviewing outside history, ordering and reviewing medications, tests or procedures, care coordination (communications with other health care professionals or caregivers) and documentation in the medical record.

## 2021-04-03 ENCOUNTER — Other Ambulatory Visit: Payer: Self-pay

## 2021-04-03 ENCOUNTER — Inpatient Hospital Stay: Payer: Medicare Other | Attending: Oncology

## 2021-04-03 ENCOUNTER — Inpatient Hospital Stay (HOSPITAL_BASED_OUTPATIENT_CLINIC_OR_DEPARTMENT_OTHER): Payer: Medicare Other | Admitting: Oncology

## 2021-04-03 VITALS — BP 109/64 | HR 80 | Temp 97.9°F | Resp 18 | Ht 64.25 in | Wt 156.4 lb

## 2021-04-03 DIAGNOSIS — Z9221 Personal history of antineoplastic chemotherapy: Secondary | ICD-10-CM | POA: Diagnosis not present

## 2021-04-03 DIAGNOSIS — Z923 Personal history of irradiation: Secondary | ICD-10-CM | POA: Diagnosis not present

## 2021-04-03 DIAGNOSIS — Z170 Estrogen receptor positive status [ER+]: Secondary | ICD-10-CM | POA: Diagnosis not present

## 2021-04-03 DIAGNOSIS — C50412 Malignant neoplasm of upper-outer quadrant of left female breast: Secondary | ICD-10-CM

## 2021-04-03 DIAGNOSIS — C50919 Malignant neoplasm of unspecified site of unspecified female breast: Secondary | ICD-10-CM | POA: Insufficient documentation

## 2021-04-03 DIAGNOSIS — M85852 Other specified disorders of bone density and structure, left thigh: Secondary | ICD-10-CM | POA: Diagnosis not present

## 2021-04-03 DIAGNOSIS — Z79811 Long term (current) use of aromatase inhibitors: Secondary | ICD-10-CM | POA: Insufficient documentation

## 2021-04-03 LAB — CBC WITH DIFFERENTIAL (CANCER CENTER ONLY)
Abs Immature Granulocytes: 0.02 10*3/uL (ref 0.00–0.07)
Basophils Absolute: 0.1 10*3/uL (ref 0.0–0.1)
Basophils Relative: 1 %
Eosinophils Absolute: 0.3 10*3/uL (ref 0.0–0.5)
Eosinophils Relative: 5 %
HCT: 38.2 % (ref 36.0–46.0)
Hemoglobin: 13 g/dL (ref 12.0–15.0)
Immature Granulocytes: 0 %
Lymphocytes Relative: 29 %
Lymphs Abs: 1.7 10*3/uL (ref 0.7–4.0)
MCH: 31.4 pg (ref 26.0–34.0)
MCHC: 34 g/dL (ref 30.0–36.0)
MCV: 92.3 fL (ref 80.0–100.0)
Monocytes Absolute: 0.5 10*3/uL (ref 0.1–1.0)
Monocytes Relative: 9 %
Neutro Abs: 3.2 10*3/uL (ref 1.7–7.7)
Neutrophils Relative %: 56 %
Platelet Count: 304 10*3/uL (ref 150–400)
RBC: 4.14 MIL/uL (ref 3.87–5.11)
RDW: 12.3 % (ref 11.5–15.5)
WBC Count: 5.7 10*3/uL (ref 4.0–10.5)
nRBC: 0 % (ref 0.0–0.2)

## 2021-04-03 LAB — CMP (CANCER CENTER ONLY)
ALT: 22 U/L (ref 0–44)
AST: 22 U/L (ref 15–41)
Albumin: 4 g/dL (ref 3.5–5.0)
Alkaline Phosphatase: 55 U/L (ref 38–126)
Anion gap: 8 (ref 5–15)
BUN: 12 mg/dL (ref 8–23)
CO2: 26 mmol/L (ref 22–32)
Calcium: 9.2 mg/dL (ref 8.9–10.3)
Chloride: 106 mmol/L (ref 98–111)
Creatinine: 0.82 mg/dL (ref 0.44–1.00)
GFR, Estimated: >60 mL/min (ref >60)
Glucose, Bld: 66 mg/dL — ABNORMAL LOW (ref 70–99)
Potassium: 4.3 mmol/L (ref 3.5–5.1)
Sodium: 140 mmol/L (ref 135–145)
Total Bilirubin: 0.4 mg/dL (ref 0.3–1.2)
Total Protein: 7 g/dL (ref 6.5–8.1)

## 2021-04-03 MED ORDER — ANASTROZOLE 1 MG PO TABS: ORAL_TABLET | ORAL | 3 refills | Status: DC

## 2021-04-13 ENCOUNTER — Encounter: Payer: Self-pay | Admitting: Oncology

## 2021-04-20 ENCOUNTER — Encounter: Payer: Self-pay | Admitting: Sports Medicine

## 2021-04-20 DIAGNOSIS — M1612 Unilateral primary osteoarthritis, left hip: Secondary | ICD-10-CM

## 2021-04-24 ENCOUNTER — Other Ambulatory Visit: Payer: Self-pay

## 2021-04-24 ENCOUNTER — Ambulatory Visit (INDEPENDENT_AMBULATORY_CARE_PROVIDER_SITE_OTHER): Payer: Medicare Other | Admitting: Physical Therapy

## 2021-04-24 DIAGNOSIS — M533 Sacrococcygeal disorders, not elsewhere classified: Secondary | ICD-10-CM

## 2021-04-24 DIAGNOSIS — M6281 Muscle weakness (generalized): Secondary | ICD-10-CM | POA: Diagnosis not present

## 2021-04-24 DIAGNOSIS — M25652 Stiffness of left hip, not elsewhere classified: Secondary | ICD-10-CM | POA: Diagnosis not present

## 2021-04-24 DIAGNOSIS — M25552 Pain in left hip: Secondary | ICD-10-CM

## 2021-04-24 DIAGNOSIS — R2689 Other abnormalities of gait and mobility: Secondary | ICD-10-CM

## 2021-04-24 NOTE — Therapy (Signed)
[place], [name], [address] Phone: [phone]   Fax:  [phone]  Physical Therapy Evaluation  Patient Details  Name: MARIESA GRIEDER MRN: [id number] Date of Birth: 1956/04/13 Referring Provider (PT): Silverio Decamp, MD   Encounter Date: 04/24/2021   PT End of Session - 04/24/21 1701     Visit Number 1    Number of Visits 12    Date for PT Re-Evaluation 06/05/21    Authorization Type Medicare    Progress Note Due on Visit 10    PT Start Time 1345    PT Stop Time 1430    PT Time Calculation (min) 45 min    Activity Tolerance Patient tolerated treatment well    Behavior During Therapy [facility] for tasks assessed/performed             Past Medical History:  Diagnosis Date   Allergy    Anemia    Arthritis    feet   Basal cell carcinoma    Breast cancer ([place]) 06/2016   left breast   Colon polyps    Eczema    Genetic testing 08/15/2016   Ms. Dehoyos underwent genetic testing for hereditary cancer syndrome through [employer]'s 43-gene Common Hereditary Cancers Panel. Ms. 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 Result report is dated 08/15/2016. Please see genetic counseling documentation from 08/17/2016 for further discussion.   History of radiation therapy 01/02/17-01/31/17   left breast was treated to 42.72 Gy in 16 fractions, left breast boost 10 Gy in 5 fractions   Personal history of chemotherapy    Personal history of radiation therapy    PONV (postoperative nausea and vomiting)     Past Surgical History:  Procedure Laterality Date   BONE SPUR Bilateral 2001 AND 1988   BREAST BIOPSY     BREAST LUMPECTOMY Left    07/2016   BREAST LUMPECTOMY WITH RADIOACTIVE SEED AND SENTINEL LYMPH NODE BIOPSY Left 07/26/2016   Procedure: BREAST LUMPECTOMY WITH RADIOACTIVE SEED AND SENTINEL LYMPH NODE BIOPSY;  Surgeon: [name], MD;  Location: [place];  Service: General;  Laterality: Left;   BUNIONECTOMY Bilateral 02/2012   COLONOSCOPY W/ POLYPECTOMY  08/2007   DILATION AND CURETTAGE OF UTERUS  2002   MOHS SURGERY  2010   PORT-A-CATH REMOVAL N/A 07/18/2017   Procedure: REMOVAL PORT-A-CATH;  Surgeon: [name], MD;  Location: [place];  Service: General;  Laterality: N/A;   PORTACATH PLACEMENT Right 07/26/2016   Procedure: INSERTION PORT-A-CATH;  Surgeon: [name], MD;  Location: [place];  Service: General;  Laterality: Right;    There were no vitals filed for this visit.    Subjective Assessment - 04/24/21 1345     Subjective Pt reports this has been an ongoing issue late Spring or early Summer. Cannot note any specific event. Pt notes hip flexor/groin pain. Pt has been doing exercises which has improved (i.e. lifting leg and stairs). It's still not like R leg. Pt states when she twists/pivots a certain way it seems to cause a sharp pain/catch. Pt notes that certain activities seem to exacerbate the pain.    Pertinent History Prior back pain    Limitations Standing;House hold activities    How long can you sit comfortably? n/a    How long can you stand comfortably? n/a    How long can you walk comfortably? n/a  Patient Stated Goals Reduce pain    Currently in Pain? Yes    Pain Score 2    At worst, 7 or 8/10   Pain Location Hip    Pain Orientation Left    Pain Descriptors / Indicators Aching    Pain Type Acute pain    Pain Onset More than a month ago    Pain Frequency Constant    Aggravating Factors  Certain movements in standing    Pain Relieving Factors Rest, heat    Effect of Pain on Daily Activities Difficulty putting up Christmas decorations                [facility] PT Assessment - 04/24/21 0001       Assessment   Medical Diagnosis M16.12 (ICD-10-CM) - Primary osteoarthritis of left hip    Referring Provider (PT) Silverio Decamp, MD     Onset Date/Surgical Date --   Spring/summer 2022   Prior Therapy Several years ago for R leg/back      Precautions   Precautions None      Restrictions   Weight Bearing Restrictions No      Balance Screen   Has the patient fallen in the past 6 months No      [place] residence    Home Access Stairs to enter    Entrance Stairs-Number of Steps A couple steps    Home Layout Two level    Alternate Level Stairs-Number of Steps A flight      Prior Function   Level of Independence Independent    Vocation Retired    Leisure Go to [employer] and do classes, yoga, sew/crochet      Observation/Other Assessments   Focus on Therapeutic Outcomes (FOTO)  66 (risk adjusted 62); predicted 75      ROM / Strength   AROM / PROM / Strength AROM;Strength      Strength   Strength Assessment Site Hip;Knee    Right/Left Hip Right;Left    Right Hip Flexion 5/5    Right Hip Extension 5/5    Right Hip External Rotation  5/5    Right Hip Internal Rotation 5/5    Right Hip ABduction 3+/5    Left Hip Flexion 4/5    Left Hip Extension 4/5    Left Hip External Rotation 4/5    Left Hip Internal Rotation 4+/5    Left Hip ABduction 3+/5    Right/Left Knee Right;Left    Right Knee Flexion 4+/5    Right Knee Extension 4+/5    Left Knee Flexion 4/5    Left Knee Extension 4/5      Flexibility   Soft Tissue Assessment /Muscle Length yes    Hamstrings tight Rt 85 deg; Lt 75 deg      Palpation   SI assessment  In standing: L ASIS higher.  supine: L ASIS lower with longer L LE.    Palpation comment hypomobile with L ilium spring testing      Special Tests    Special Tests Lumbar    Lumbar Tests Prone Knee Bend Test;Straight Leg Raise      Prone Knee Bend Test   Findings Negative    Comment greater L quad tightness      Straight Leg Raise   Findings Negative    Comment L>R hamstring tightness  Objective measurements  completed on examination: See above findings.       [place] Adult PT Treatment/Exercise - 04/24/21 0001       Exercises   Exercises Knee/Hip      Knee/Hip Exercises: Supine   Bridges with Clamshell Strengthening;Both;2 sets;10 reps   red tband   Other Supine Knee/Hip Exercises SIJ MET to reduce L anterior inominate rotation: hips/knees 90/90 with iso hamstring/glute contraction against hand 3x5 sec    Other Supine Knee/Hip Exercises Clamshell red tband 2x10                     PT Education - 04/24/21 1703     Education Details Discussed exam findings, POC, and HEP    Person(s) Educated Patient    Methods Explanation;Demonstration;Tactile cues;Verbal cues;Handout    Comprehension Verbalized understanding;Returned demonstration;Verbal cues required;Tactile cues required                 PT Long Term Goals - 04/24/21 1708       PT LONG TERM GOAL #1   Title Pt will be independent with HEP    Time 6    Period Weeks    Status New    Target Date 06/05/21      PT LONG TERM GOAL #2   Title Pt will report decrease in L hip pain by at least 75%    Time 6    Period Weeks    Status New    Target Date 06/05/21      PT LONG TERM GOAL #3   Title Pt will be able to perform a flight of stairs with no pain    Time 6    Period Weeks    Status New    Target Date 06/05/21      PT LONG TERM GOAL #4   Title Pt will have improved FOTO score to at least 75    Time 6    Period Weeks    Status New    Target Date 06/05/21      PT LONG TERM GOAL #5   Title Pt will be able to demo at least 4+/5 bilat hip abductor strength    Time 6    Period Weeks    Status New    Target Date 06/05/21                    Plan - 04/24/21 1703     Clinical Impression Statement Ms. Tomasa Dobransky is a 65 y/o F presenting to OPPT due to ongoing anterior L hip/groin pain since earlier this year. 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 Pt has been performing exercises provided by her doctor with some improvement but she still has residual pain/tightness. Pt would highly benefit from PT to return to her PLOF.    Personal Factors and Comorbidities Age;Fitness;Time since onset of injury/illness/exacerbation    Examination-Activity Limitations Transfers    Examination-Participation Restrictions Community Activity;Cleaning    Stability/Clinical Decision Making Stable/Uncomplicated    Clinical Decision Making Low    Rehab Potential Good    PT Frequency 2x / week    PT Duration 6 weeks    PT Treatment/Interventions ADLs/Self Care Home Management;Cryotherapy;Electrical Stimulation;Iontophoresis 16m/ml Dexamethasone;Moist Heat;Gait training;Stair training;Functional mobility training;Therapeutic activities;Therapeutic exercise;Balance training;Neuromuscular re-education;Manual techniques;Patient/family education;Passive range of motion;Dry needling;Taping    PT Next Visit Plan  Assess pelvic alignment and address accordingly. Consider Hesch techniques for anterior inominate. Work on hip/pelvis/core stability.    PT Home Exercise Plan Access Code: [email or web address]    Consulted and Agree with Plan of Care Patient             Patient will benefit from skilled therapeutic intervention in order to improve the following deficits and impairments:  Decreased range of motion, Increased fascial restricitons, Decreased activity tolerance, Pain, Decreased mobility, Decreased strength  Visit Diagnosis: Pain in left hip  Stiffness of left hip, not elsewhere classified  Sacrococcygeal disorders, not elsewhere classified  Muscle weakness (generalized)  Other abnormalities of gait and mobility     Problem List Patient Active Problem List   Diagnosis Date Noted   Primary osteoarthritis of left hip 03/23/2021   Family  history of osteoporosis 06/12/2018   History of breast cancer 06/12/2018   Advance directive discussed with patient 04/03/2018   Pap smear of cervix declined 04/03/2018   Lumbago 08/16/2017   Basal cell carcinoma 07/17/2017   Vaginismus 07/17/2017   Abnormal TSH 03/28/2017   Diarrhea 11/05/2016   Port catheter in place 10/23/2016   Genetic testing 08/15/2016   Malignant neoplasm of upper-outer quadrant of left breast in female, estrogen receptor positive ([place]) 07/10/2016   Allergic rhinitis 06/13/2016   History of basal cell carcinoma 06/13/2016   Eczema 06/13/2016   Hyperlipemia 06/13/2016   History of colon polyps 06/13/2016   Plantar fascial fibromatosis 06/13/2016   RLS (restless legs syndrome) 06/13/2016   Subclinical hypothyroidism 07/20/2013   Bunion 01/21/2013   Osteopenia 01/20/2013    Grayson White April Gordy Levan, PT, DPT 04/24/2021, 5:10 PM  [facility] [place], [name], [address] Phone: [phone]   Fax:  [phone]  Name: CARISHA KANTOR MRN: [id number] Date of Birth: Sep 26, 1955

## 2021-04-27 ENCOUNTER — Telehealth: Payer: Self-pay | Admitting: Oncology

## 2021-04-27 ENCOUNTER — Other Ambulatory Visit: Payer: Self-pay

## 2021-04-27 ENCOUNTER — Ambulatory Visit (INDEPENDENT_AMBULATORY_CARE_PROVIDER_SITE_OTHER): Payer: Medicare Other | Admitting: Physical Therapy

## 2021-04-27 DIAGNOSIS — M25552 Pain in left hip: Secondary | ICD-10-CM | POA: Diagnosis present

## 2021-04-27 DIAGNOSIS — M6281 Muscle weakness (generalized): Secondary | ICD-10-CM | POA: Diagnosis not present

## 2021-04-27 DIAGNOSIS — R2689 Other abnormalities of gait and mobility: Secondary | ICD-10-CM | POA: Diagnosis not present

## 2021-04-27 DIAGNOSIS — M25652 Stiffness of left hip, not elsewhere classified: Secondary | ICD-10-CM | POA: Diagnosis not present

## 2021-04-27 DIAGNOSIS — M533 Sacrococcygeal disorders, not elsewhere classified: Secondary | ICD-10-CM | POA: Diagnosis not present

## 2021-04-27 NOTE — Therapy (Signed)
[place], [name], [address] Phone: [phone]   Fax:  [phone]  Physical Therapy Treatment  Patient Details  Name: Jamie [name] MRN: [id number] Date of Birth: 08-31-1955 Referring Provider (PT): Silverio Decamp, MD   Encounter Date: 04/27/2021   PT End of Session - 04/27/21 1536     Visit Number 2    Number of Visits 12    Date for PT Re-Evaluation 06/05/21    Authorization Type Medicare    Progress Note Due on Visit 10    PT Start Time 1535    PT Stop Time 1615    PT Time Calculation (min) 40 min    Activity Tolerance Patient tolerated treatment well    Behavior During Therapy [facility] for tasks assessed/performed             Past Medical History:  Diagnosis Date   Allergy    Anemia    Arthritis    feet   Basal cell carcinoma    Breast cancer ([place]) 06/2016   left breast   Colon polyps    Eczema    Genetic testing 08/15/2016   Ms. Porrata underwent genetic testing for hereditary cancer syndrome through [employer]'s 43-gene Common Hereditary Cancers Panel. Ms. 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 Result report is dated 08/15/2016. Please see genetic counseling documentation from 08/17/2016 for further discussion.   History of radiation therapy 01/02/17-01/31/17   left breast was treated to 42.72 Gy in 16 fractions, left breast boost 10 Gy in 5 fractions   Personal history of chemotherapy    Personal history of radiation therapy    PONV (postoperative nausea and vomiting)     Past Surgical History:  Procedure Laterality Date   BONE SPUR Bilateral 2001 AND 1988   BREAST BIOPSY     BREAST LUMPECTOMY Left    07/2016   BREAST LUMPECTOMY WITH RADIOACTIVE SEED AND SENTINEL LYMPH NODE BIOPSY Left 07/26/2016   Procedure: BREAST LUMPECTOMY WITH RADIOACTIVE SEED AND SENTINEL LYMPH NODE BIOPSY;  Surgeon: [name], MD;  Location: [place];  Service: General;  Laterality: Left;   BUNIONECTOMY Bilateral 02/2012   COLONOSCOPY W/ POLYPECTOMY  08/2007   DILATION AND CURETTAGE OF UTERUS  2002   MOHS SURGERY  2010   PORT-A-CATH REMOVAL N/A 07/18/2017   Procedure: REMOVAL PORT-A-CATH;  Surgeon: [name], MD;  Location: [place];  Service: General;  Laterality: N/A;   PORTACATH PLACEMENT Right 07/26/2016   Procedure: INSERTION PORT-A-CATH;  Surgeon: [name], MD;  Location: [place];  Service: General;  Laterality: Right;    There were no vitals filed for this visit.   Subjective Assessment - 04/27/21 1536     Subjective Pt reports exercises has been going okay. Pt states last night going to bed she was uncomfortable and had to put some ointment on it.    Pertinent History Prior back pain    Limitations Standing;House hold activities    How long can you sit comfortably? n/a    How long can you stand comfortably? n/a    How long can you walk comfortably? n/a    Patient Stated Goals Reduce pain    Currently in Pain? Yes    Pain Score 4     Pain Location Hip    Pain Orientation Left    Pain Onset More than a month ago  [place] Adult PT Treatment/Exercise - 04/27/21 0001       Knee/Hip Exercises: Aerobic   Nustep L6 x 5 min LEs      Knee/Hip Exercises: Supine   Bridges with Clamshell Strengthening;Both;2 sets;10 reps   green tband   Other Supine Knee/Hip Exercises SIJ MET to reduce L anterior inominate rotation: hips/knees 90/90 with iso hamstring/glute contraction against hand 3x5 sec    Other Supine Knee/Hip Exercises Single knee to chest + ER x 2 min      Knee/Hip Exercises: Sidelying   Clams 2x10 green tband      Knee/Hip Exercises: Prone   Hip Extension Strengthening;Right;Left   knee bent   Hip Extension Limitations green tband      Manual Therapy   Manual therapy comments gentle hip mob for  extension grade II to III                     PT Education - 04/27/21 1623     Education Details Discussed how to check for pelvic rotation in standing. Discussed steps to do in the case of her pain.    Person(s) Educated Patient    Methods Explanation;Demonstration;Tactile cues;Verbal cues    Comprehension Verbalized understanding;Returned demonstration;Verbal cues required;Tactile cues required                 PT Long Term Goals - 04/24/21 1708       PT LONG TERM GOAL #1   Title Pt will be independent with HEP    Time 6    Period Weeks    Status New    Target Date 06/05/21      PT LONG TERM GOAL #2   Title Pt will report decrease in L hip pain by at least 75%    Time 6    Period Weeks    Status New    Target Date 06/05/21      PT LONG TERM GOAL #3   Title Pt will be able to perform a flight of stairs with no pain    Time 6    Period Weeks    Status New    Target Date 06/05/21      PT LONG TERM GOAL #4   Title Pt will have improved FOTO score to at least 75    Time 6    Period Weeks    Status New    Target Date 06/05/21      PT LONG TERM GOAL #5   Title Pt will be able to demo at least 4+/5 bilat hip abductor strength    Time 6    Period Weeks    Status New    Target Date 06/05/21                   Plan - 04/27/21 1553     Clinical Impression Statement Pt comes into clinic with some L anterior inominate rotation. Provided MET and stretching with some improvement in her "grabbing" sensation. Worked on progressing her pelvic, hip and core strengthening exercises. Pain reduced to 2/10 by end of session.    Personal Factors and Comorbidities Age;Fitness;Time since onset of injury/illness/exacerbation    Examination-Activity Limitations Transfers    Examination-Participation Restrictions Community Activity;Cleaning    Stability/Clinical Decision Making Stable/Uncomplicated    Rehab Potential Good    PT Frequency 2x / week    PT  Duration 6 weeks    PT Treatment/Interventions ADLs/Self Care Home Management;Cryotherapy;Electrical Stimulation;Iontophoresis 4m/ml Dexamethasone;Moist  Heat;Gait training;Stair training;Functional mobility training;Therapeutic activities;Therapeutic exercise;Balance training;Neuromuscular re-education;Manual techniques;Patient/family education;Passive range of motion;Dry needling;Taping    PT Next Visit Plan Assess pelvic alignment and address accordingly. Consider Hesch techniques for anterior inominate. Work on hip/pelvis/core stability.    PT Home Exercise Plan Access Code: [email or web address]    Consulted and Agree with Plan of Care Patient             Patient will benefit from skilled therapeutic intervention in order to improve the following deficits and impairments:  Decreased range of motion, Increased fascial restricitons, Decreased activity tolerance, Pain, Decreased mobility, Decreased strength  Visit Diagnosis: Pain in left hip  Stiffness of left hip, not elsewhere classified  Sacrococcygeal disorders, not elsewhere classified  Muscle weakness (generalized)  Other abnormalities of gait and mobility     Problem List Patient Active Problem List   Diagnosis Date Noted   Primary osteoarthritis of left hip 03/23/2021   Family history of osteoporosis 06/12/2018   History of breast cancer 06/12/2018   Advance directive discussed with patient 04/03/2018   Pap smear of cervix declined 04/03/2018   Lumbago 08/16/2017   Basal cell carcinoma 07/17/2017   Vaginismus 07/17/2017   Abnormal TSH 03/28/2017   Diarrhea 11/05/2016   Port catheter in place 10/23/2016   Genetic testing 08/15/2016   Malignant neoplasm of upper-outer quadrant of left breast in female, estrogen receptor positive ([place]) 07/10/2016   Allergic rhinitis 06/13/2016   History of basal cell carcinoma 06/13/2016   Eczema 06/13/2016   Hyperlipemia 06/13/2016   History of colon polyps 06/13/2016   Plantar fascial  fibromatosis 06/13/2016   RLS (restless legs syndrome) 06/13/2016   Subclinical hypothyroidism 07/20/2013   Bunion 01/21/2013   Osteopenia 01/20/2013    Apolo Cutshaw April Gordy Levan, PT, DPT 04/27/2021, 5:53 PM  [facility] [place], [name], [address] Phone: [phone]   Fax:  [phone]  Name: Jamie [name] MRN: [id number] Date of Birth: 08-28-1955

## 2021-04-27 NOTE — Telephone Encounter (Signed)
Scheduled per sch msg. Called and spoke with patient. Confirmed new date and time  

## 2021-05-02 ENCOUNTER — Other Ambulatory Visit: Payer: Self-pay

## 2021-05-02 ENCOUNTER — Encounter: Payer: Self-pay | Admitting: Physical Therapy

## 2021-05-02 ENCOUNTER — Ambulatory Visit (INDEPENDENT_AMBULATORY_CARE_PROVIDER_SITE_OTHER): Payer: Medicare Other | Admitting: Physical Therapy

## 2021-05-02 DIAGNOSIS — M25652 Stiffness of left hip, not elsewhere classified: Secondary | ICD-10-CM

## 2021-05-02 DIAGNOSIS — M533 Sacrococcygeal disorders, not elsewhere classified: Secondary | ICD-10-CM | POA: Diagnosis not present

## 2021-05-02 DIAGNOSIS — M25552 Pain in left hip: Secondary | ICD-10-CM | POA: Diagnosis present

## 2021-05-02 DIAGNOSIS — M6281 Muscle weakness (generalized): Secondary | ICD-10-CM

## 2021-05-02 NOTE — Therapy (Signed)
[place], [name], [address] Phone: [phone]   Fax:  [phone]  Physical Therapy Treatment  Patient Details  Name: Jamie [name] MRN: [id number] Date of Birth: 16-Sep-1955 Referring Provider (PT): Silverio Decamp, MD   Encounter Date: 05/02/2021   PT End of Session - 05/02/21 1145     Visit Number 3    Number of Visits 12    Date for PT Re-Evaluation 06/05/21    Authorization Type Medicare    Authorization - Visit Number 3    Progress Note Due on Visit 10    PT Start Time 1147    PT Stop Time 1234    PT Time Calculation (min) 47 min    Activity Tolerance Patient tolerated treatment well    Behavior During Therapy [facility] for tasks assessed/performed             Past Medical History:  Diagnosis Date   Allergy    Anemia    Arthritis    feet   Basal cell carcinoma    Breast cancer ([place]) 06/2016   left breast   Colon polyps    Eczema    Genetic testing 08/15/2016   Ms. Palinkas underwent genetic testing for hereditary cancer syndrome through [employer]'s 43-gene Common Hereditary Cancers Panel. Ms. 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 Result report is dated 08/15/2016. Please see genetic counseling documentation from 08/17/2016 for further discussion.   History of radiation therapy 01/02/17-01/31/17   left breast was treated to 42.72 Gy in 16 fractions, left breast boost 10 Gy in 5 fractions   Personal history of chemotherapy    Personal history of radiation therapy    PONV (postoperative nausea and vomiting)     Past Surgical History:  Procedure Laterality Date   BONE SPUR Bilateral 2001 AND 1988   BREAST BIOPSY     BREAST LUMPECTOMY Left    07/2016   BREAST LUMPECTOMY WITH RADIOACTIVE SEED AND SENTINEL LYMPH NODE BIOPSY Left 07/26/2016   Procedure: BREAST LUMPECTOMY WITH RADIOACTIVE SEED AND SENTINEL  LYMPH NODE BIOPSY;  Surgeon: [name], MD;  Location: [place];  Service: General;  Laterality: Left;   BUNIONECTOMY Bilateral 02/2012   COLONOSCOPY W/ POLYPECTOMY  08/2007   DILATION AND CURETTAGE OF UTERUS  2002   MOHS SURGERY  2010   PORT-A-CATH REMOVAL N/A 07/18/2017   Procedure: REMOVAL PORT-A-CATH;  Surgeon: [name], MD;  Location: [place];  Service: General;  Laterality: N/A;   PORTACATH PLACEMENT Right 07/26/2016   Procedure: INSERTION PORT-A-CATH;  Surgeon: [name], MD;  Location: [place];  Service: General;  Laterality: Right;    There were no vitals filed for this visit.   Subjective Assessment - 05/02/21 1149     Subjective Pt reports she was following her 66 yr old granddaughter this weekend, with lots of pivoting.  That night and next day, Lt hip was pretty painful - 7/10.  She did some exercises and heat; pain eased up with time.  Overall pain is reducing with exercises, pain is not as intense as it once was.    Currently in Pain? Yes    Pain Score 4     Pain Location Leg    Pain Orientation Left;Anterior;Proximal    Pain Descriptors / Indicators Sore    Aggravating Factors  Rt pivoting    Pain Relieving Factors rest,  heat                [facility] PT Assessment - 05/02/21 0001       Assessment   Medical Diagnosis M16.12 (ICD-10-CM) - Primary osteoarthritis of left hip    Referring Provider (PT) Silverio Decamp, MD    Onset Date/Surgical Date --   Spring/summer 2022   Prior Therapy Several years ago for R leg/back      Palpation   SI assessment  Lt ASIS higher than Rt.    Palpation comment LLE appears shorter in supine.  point tender in Lt ant groin, point tender Lt QL.                [place] Adult PT Treatment/Exercise - 05/02/21 0001       Self-Care   Self-Care Other Self-Care Comments    Other Self-Care Comments  reviewed self massage with ball to Lt QL in standing. Pt returned demo with cues.       Knee/Hip Exercises: Stretches   Passive Hamstring Stretch Left;2 reps;Right;1 rep;30 seconds    Hip Flexor Stretch Left;30 seconds;3 reps    Other Knee/Hip Stretches modified pigeon pose x 2 reps LLE, 1 rep RLE, limited tolerance on LLE    Other Knee/Hip Stretches sidelying QL/ ITB stretch in Rt sidelying with Lt arm overhead x 20 sec x 2 reps      Knee/Hip Exercises: Aerobic   Tread Mill 1.9-2.1 mph for warm up.    Other Aerobic single laps around gym to assess response to exercise.      Knee/Hip Exercises: Supine   Bridges with Clamshell Strengthening;Both;10 reps;2 sets   green band, 5 sec hold in ext for 1 set   Other Supine Knee/Hip Exercises MET of Lt hip flexor contraction x 5 sec x 2.    Other Supine Knee/Hip Exercises Lt single knee to chest painful- stopped.      Knee/Hip Exercises: Sidelying   Clams Lt clam with red band - painful.  removed band, given cues to lengthen Lt QL, engage core and squeeze through hip rotators with good tolerance 2x10      Knee/Hip Exercises: Prone   Hip Extension Strengthening;Right;Left;1 set;10 reps   no resistance, core engaged, cues to not lift as high.                       PT Long Term Goals - 04/24/21 1708       PT LONG TERM GOAL #1   Title Pt will be independent with HEP    Time 6    Period Weeks    Status New    Target Date 06/05/21      PT LONG TERM GOAL #2   Title Pt will report decrease in L hip pain by at least 75%    Time 6    Period Weeks    Status New    Target Date 06/05/21      PT LONG TERM GOAL #3   Title Pt will be able to perform a flight of stairs with no pain    Time 6    Period Weeks    Status New    Target Date 06/05/21      PT LONG TERM GOAL #4   Title Pt will have improved FOTO score to at least 75    Time 6    Period Weeks    Status New    Target Date 06/05/21  PT LONG TERM GOAL #5   Title Pt will be able to demo at least 4+/5 bilat hip abductor strength    Time 6    Period Weeks     Status New    Target Date 06/05/21                   Plan - 05/02/21 1244     Clinical Impression Statement Pt presents with pelvic asymmetry - Lt ilium higher and posteriorly rotated.  Stretches added to HEP with good relief of symptoms on Lt hip/low back. Pt reported pain in groin with Lt resisted clams in Rt sidelying; improved tolerance without resistance and cues for correct alignment/form.  Instructed pt to hold off on MET corrections for now and continue HEP with modifications.  Pt left session pain free.  Pt progressing towards LTGs.    Personal Factors and Comorbidities Age;Fitness;Time since onset of injury/illness/exacerbation    Examination-Activity Limitations Transfers    Examination-Participation Restrictions Community Activity;Cleaning    Stability/Clinical Decision Making Stable/Uncomplicated    Rehab Potential Good    PT Frequency 2x / week    PT Duration 6 weeks    PT Treatment/Interventions ADLs/Self Care Home Management;Cryotherapy;Electrical Stimulation;Iontophoresis 20m/ml Dexamethasone;Moist Heat;Gait training;Stair training;Functional mobility training;Therapeutic activities;Therapeutic exercise;Balance training;Neuromuscular re-education;Manual techniques;Patient/family education;Passive range of motion;Dry needling;Taping    PT Next Visit Plan Assess pelvic alignment and address accordingly. continue pelvis stabilization exercises.    PT Home Exercise Plan Access Code: [email or web address]   Consulted and Agree with Plan of Care Patient             Patient will benefit from skilled therapeutic intervention in order to improve the following deficits and impairments:  Decreased range of motion, Increased fascial restricitons, Decreased activity tolerance, Pain, Decreased mobility, Decreased strength  Visit Diagnosis: Pain in left hip  Stiffness of left hip, not elsewhere classified  Sacrococcygeal disorders, not elsewhere classified  Muscle weakness  (generalized)     Problem List Patient Active Problem List   Diagnosis Date Noted   Primary osteoarthritis of left hip 03/23/2021   Family history of osteoporosis 06/12/2018   History of breast cancer 06/12/2018   Advance directive discussed with patient 04/03/2018   Pap smear of cervix declined 04/03/2018   Lumbago 08/16/2017   Basal cell carcinoma 07/17/2017   Vaginismus 07/17/2017   Abnormal TSH 03/28/2017   Diarrhea 11/05/2016   Port catheter in place 10/23/2016   Genetic testing 08/15/2016   Malignant neoplasm of upper-outer quadrant of left breast in female, estrogen receptor positive ([place] 07/10/2016   Allergic rhinitis 06/13/2016   History of basal cell carcinoma 06/13/2016   Eczema 06/13/2016   Hyperlipemia 06/13/2016   History of colon polyps 06/13/2016   Plantar fascial fibromatosis 06/13/2016   RLS (restless legs syndrome) 06/13/2016   Subclinical hypothyroidism 07/20/2013   Bunion 01/21/2013   Osteopenia 01/20/2013   JKerin Perna PTA 05/02/21 12:56 PM   [place] [address]Phone: [phone]  Fax:  [phone] Name: EJANIECE SCOVILLMRN: [id number] of Birth: [birth year]

## 2021-05-02 NOTE — Patient Instructions (Signed)
Access Code: [email or web address] URL: [email or web address] Date: 05/02/2021 Prepared by: [place] with Hip Abduction and Resistance - 1 x daily - 7 x weekly - 2 sets - 10 reps Clamshell with Resistance - 1 x daily - 7 x weekly - 2 sets - 10 reps Prone Hip Extension with Bent Knee - 1 x daily - 7 x weekly - 2 sets - 10 reps Seated Hip Flexor Stretch - 2 x daily - 7 x weekly - 1 sets - 2 reps - 15-3030 seconds hold Sidelying ITB Stretch off Table - 1 x daily - 7 x weekly - 3 sets - 2 reps - 15-30 seconds hold

## 2021-05-04 ENCOUNTER — Other Ambulatory Visit: Payer: Self-pay

## 2021-05-04 ENCOUNTER — Ambulatory Visit (INDEPENDENT_AMBULATORY_CARE_PROVIDER_SITE_OTHER): Payer: Medicare Other | Admitting: Physical Therapy

## 2021-05-04 DIAGNOSIS — M25652 Stiffness of left hip, not elsewhere classified: Secondary | ICD-10-CM | POA: Diagnosis not present

## 2021-05-04 DIAGNOSIS — M25552 Pain in left hip: Secondary | ICD-10-CM

## 2021-05-04 DIAGNOSIS — M6281 Muscle weakness (generalized): Secondary | ICD-10-CM | POA: Diagnosis not present

## 2021-05-04 DIAGNOSIS — R2689 Other abnormalities of gait and mobility: Secondary | ICD-10-CM | POA: Diagnosis not present

## 2021-05-04 DIAGNOSIS — M533 Sacrococcygeal disorders, not elsewhere classified: Secondary | ICD-10-CM | POA: Diagnosis not present

## 2021-05-04 NOTE — Therapy (Signed)
[place], [name], [address] Phone: [phone]   Fax:  [phone]  Physical Therapy Treatment  Patient Details  Name: Jamie [name] MRN: [id number] Date of Birth: 1955/11/23 Referring Provider (PT): Silverio Decamp, MD   Encounter Date: 05/04/2021   PT End of Session - 05/04/21 0851     Visit Number 4    Number of Visits 12    Date for PT Re-Evaluation 06/05/21    Authorization Type Medicare    Authorization - Visit Number 4    Progress Note Due on Visit 10    PT Start Time [phone]    PT Stop Time 0930    PT Time Calculation (min) 39 min    Activity Tolerance Patient tolerated treatment well    Behavior During Therapy [facility] for tasks assessed/performed             Past Medical History:  Diagnosis Date   Allergy    Anemia    Arthritis    feet   Basal cell carcinoma    Breast cancer ([place]) 06/2016   left breast   Colon polyps    Eczema    Genetic testing 08/15/2016   Ms. Rennaker underwent genetic testing for hereditary cancer syndrome through [employer]'s 43-gene Common Hereditary Cancers Panel. Ms. 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 Result report is dated 08/15/2016. Please see genetic counseling documentation from 08/17/2016 for further discussion.   History of radiation therapy 01/02/17-01/31/17   left breast was treated to 42.72 Gy in 16 fractions, left breast boost 10 Gy in 5 fractions   Personal history of chemotherapy    Personal history of radiation therapy    PONV (postoperative nausea and vomiting)     Past Surgical History:  Procedure Laterality Date   BONE SPUR Bilateral 2001 AND 1988   BREAST BIOPSY     BREAST LUMPECTOMY Left    07/2016   BREAST LUMPECTOMY WITH RADIOACTIVE SEED AND SENTINEL LYMPH NODE BIOPSY Left 07/26/2016   Procedure: BREAST LUMPECTOMY WITH RADIOACTIVE SEED AND SENTINEL  LYMPH NODE BIOPSY;  Surgeon: [name], MD;  Location: [place];  Service: General;  Laterality: Left;   BUNIONECTOMY Bilateral 02/2012   COLONOSCOPY W/ POLYPECTOMY  08/2007   DILATION AND CURETTAGE OF UTERUS  2002   MOHS SURGERY  2010   PORT-A-CATH REMOVAL N/A 07/18/2017   Procedure: REMOVAL PORT-A-CATH;  Surgeon: [name], MD;  Location: [place];  Service: General;  Laterality: N/A;   PORTACATH PLACEMENT Right 07/26/2016   Procedure: INSERTION PORT-A-CATH;  Surgeon: [name], MD;  Location: [place];  Service: General;  Laterality: Right;    There were no vitals filed for this visit.   Subjective Assessment - 05/04/21 0851     Subjective "Last night I felt almost pain free. But I got up this morning and I'm really sore in my L groin."    Pertinent History Prior back pain    Limitations Standing;House hold activities    How long can you sit comfortably? n/a    How long can you stand comfortably? n/a    How long can you walk comfortably? n/a    Patient Stated Goals Reduce pain    Currently in Pain? Yes    Pain Score 4     Pain Location Groin    Pain Orientation Left    Pain Descriptors /  Indicators Aching                               [facility] Adult PT Treatment/Exercise - 05/04/21 0001       Knee/Hip Exercises: Stretches   Passive Hamstring Stretch Left;2 reps;1 rep;30 seconds    Hip Flexor Stretch Left;30 seconds    Other Knee/Hip Stretches Butterfly stretch x30 sec; supine adductor stretch with strap x30 sec    Other Knee/Hip Stretches sidelying QL/ ITB stretch in Rt sidelying with Lt arm overhead x 20 sec x 2 reps      Knee/Hip Exercises: Aerobic   Nustep L5 x 5 min LEs only      Knee/Hip Exercises: Standing   Wall Squat 2 sets;10 reps      Knee/Hip Exercises: Sidelying   Hip ABduction Strengthening;Left;20 reps      Knee/Hip Exercises: Prone   Hip Extension Strengthening;Right;Left;1 set;10 reps    Hip  Extension Limitations straight leg      Manual Therapy   Manual Therapy Soft tissue mobilization    Manual therapy comments Hip mobilization for lateral/medial glides and inferior glides/distraction 3x30 sec each    Soft tissue mobilization IASTM, STW and TPR L adductors                          PT Long Term Goals - 04/24/21 1708       PT LONG TERM GOAL #1   Title Pt will be independent with HEP    Time 6    Period Weeks    Status New    Target Date 06/05/21      PT LONG TERM GOAL #2   Title Pt will report decrease in L hip pain by at least 75%    Time 6    Period Weeks    Status New    Target Date 06/05/21      PT LONG TERM GOAL #3   Title Pt will be able to perform a flight of stairs with no pain    Time 6    Period Weeks    Status New    Target Date 06/05/21      PT LONG TERM GOAL #4   Title Pt will have improved FOTO score to at least 75    Time 6    Period Weeks    Status New    Target Date 06/05/21      PT LONG TERM GOAL #5   Title Pt will be able to demo at least 4+/5 bilat hip abductor strength    Time 6    Period Weeks    Status New    Target Date 06/05/21                   Plan - 05/04/21 0920     Clinical Impression Statement Pt with L ilium higher and L adductor tightness/trigger points. Groin pain appears more to be due to muscle than increased malalignment this session. Provided stretching and manual therapy. Continued to work on Merchand[employer], retail joint mobility and provide pelvic/hip stability.    Personal Factors and Comorbidities Age;Fitness;Time since onset of injury/illness/exacerbation    Examination-Activity Limitations Transfers    Examination-Participation Restrictions Community Activity;Cleaning    Stability/Clinical Decision Making Stable/Uncomplicated    Rehab Potential Good    PT Frequency 2x / week    PT Duration 6 weeks  PT Treatment/Interventions ADLs/Self Care Home  Management;Cryotherapy;Electrical Stimulation;Iontophoresis 4mg /ml Dexamethasone;Moist Heat;Gait training;Stair training;Functional mobility training;Therapeutic activities;Therapeutic exercise;Balance training;Neuromuscular re-education;Manual techniques;Patient/family education;Passive range of motion;Dry needling;Taping    PT Next Visit Plan Assess pelvic alignment and address accordingly. Stretch hip. continue pelvis stabilization exercises. Initiate core strengthening.    PT Home Exercise Plan Access Code: [email or web address]    Consulted and Agree with Plan of Care Patient             Patient will benefit from skilled therapeutic intervention in order to improve the following deficits and impairments:  Decreased range of motion, Increased fascial restricitons, Decreased activity tolerance, Pain, Decreased mobility, Decreased strength  Visit Diagnosis: Pain in left hip  Stiffness of left hip, not elsewhere classified  Sacrococcygeal disorders, not elsewhere classified  Muscle weakness (generalized)  Other abnormalities of gait and mobility     Problem List Patient Active Problem List   Diagnosis Date Noted   Primary osteoarthritis of left hip 03/23/2021   Family history of osteoporosis 06/12/2018   History of breast cancer 06/12/2018   Advance directive discussed with patient 04/03/2018   Pap smear of cervix declined 04/03/2018   Lumbago 08/16/2017   Basal cell carcinoma 07/17/2017   Vaginismus 07/17/2017   Abnormal TSH 03/28/2017   Diarrhea 11/05/2016   Port catheter in place 10/23/2016   Genetic testing 08/15/2016   Malignant neoplasm of upper-outer quadrant of left breast in female, estrogen receptor positive ([place]) 07/10/2016   Allergic rhinitis 06/13/2016   History of basal cell carcinoma 06/13/2016   Eczema 06/13/2016   Hyperlipemia 06/13/2016   History of colon polyps 06/13/2016   Plantar fascial fibromatosis 06/13/2016   RLS (restless legs syndrome) 06/13/2016    Subclinical hypothyroidism 07/20/2013   Bunion 01/21/2013   Osteopenia 01/20/2013    Jodi Criscuolo April Ma L Rashanda Magloire, PT, DPT 05/04/2021, 10:15 AM  [facility] [place], [name], [address] Phone: [phone]   Fax:  [phone]  Name: Jamie [name] MRN: [id number] Date of Birth: 1955/09/09

## 2021-05-05 ENCOUNTER — Ambulatory Visit: Payer: Medicare Other | Admitting: Sports Medicine

## 2021-05-08 ENCOUNTER — Other Ambulatory Visit: Payer: Self-pay

## 2021-05-08 ENCOUNTER — Ambulatory Visit (INDEPENDENT_AMBULATORY_CARE_PROVIDER_SITE_OTHER): Payer: Medicare Other | Admitting: Physical Therapy

## 2021-05-08 DIAGNOSIS — M6281 Muscle weakness (generalized): Secondary | ICD-10-CM

## 2021-05-08 DIAGNOSIS — M25552 Pain in left hip: Secondary | ICD-10-CM

## 2021-05-08 DIAGNOSIS — M533 Sacrococcygeal disorders, not elsewhere classified: Secondary | ICD-10-CM | POA: Diagnosis not present

## 2021-05-08 DIAGNOSIS — R2689 Other abnormalities of gait and mobility: Secondary | ICD-10-CM

## 2021-05-08 DIAGNOSIS — M25652 Stiffness of left hip, not elsewhere classified: Secondary | ICD-10-CM

## 2021-05-08 NOTE — Therapy (Signed)
[place], [name], [address] Phone: [phone]   Fax:  [phone]  Physical Therapy Treatment  Patient Details  Name: Jamie [name] MRN: [id number] Date of Birth: 01-25-1956 Referring Provider (PT): Silverio Decamp, MD   Encounter Date: 05/08/2021   PT End of Session - 05/08/21 1432     Visit Number 5    Number of Visits 12    Date for PT Re-Evaluation 06/05/21    Authorization Type Medicare    Authorization - Visit Number 5    Progress Note Due on Visit 10    PT Start Time 1884    PT Stop Time 1515    PT Time Calculation (min) 43 min    Activity Tolerance Patient tolerated treatment well    Behavior During Therapy [facility] for tasks assessed/performed             Past Medical History:  Diagnosis Date   Allergy    Anemia    Arthritis    feet   Basal cell carcinoma    Breast cancer ([place]) 06/2016   left breast   Colon polyps    Eczema    Genetic testing 08/15/2016   Ms. Wheeless underwent genetic testing for hereditary cancer syndrome through [employer]'s 43-gene Common Hereditary Cancers Panel. Ms. 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 Result report is dated 08/15/2016. Please see genetic counseling documentation from 08/17/2016 for further discussion.   History of radiation therapy 01/02/17-01/31/17   left breast was treated to 42.72 Gy in 16 fractions, left breast boost 10 Gy in 5 fractions   Personal history of chemotherapy    Personal history of radiation therapy    PONV (postoperative nausea and vomiting)     Past Surgical History:  Procedure Laterality Date   BONE SPUR Bilateral 2001 AND 1988   BREAST BIOPSY     BREAST LUMPECTOMY Left    07/2016   BREAST LUMPECTOMY WITH RADIOACTIVE SEED AND SENTINEL LYMPH NODE BIOPSY Left 07/26/2016   Procedure: BREAST LUMPECTOMY WITH RADIOACTIVE SEED AND SENTINEL  LYMPH NODE BIOPSY;  Surgeon: [name], MD;  Location: [place];  Service: General;  Laterality: Left;   BUNIONECTOMY Bilateral 02/2012   COLONOSCOPY W/ POLYPECTOMY  08/2007   DILATION AND CURETTAGE OF UTERUS  2002   MOHS SURGERY  2010   PORT-A-CATH REMOVAL N/A 07/18/2017   Procedure: REMOVAL PORT-A-CATH;  Surgeon: [name], MD;  Location: [place];  Service: General;  Laterality: N/A;   PORTACATH PLACEMENT Right 07/26/2016   Procedure: INSERTION PORT-A-CATH;  Surgeon: [name], MD;  Location: [place];  Service: General;  Laterality: Right;    There were no vitals filed for this visit.   Subjective Assessment - 05/08/21 1434     Subjective Pt notes a lot of stretching and massaging of her adductors. "I think it's better because when I turn I don't feel it as much." Pt notes that she is no longer feeling the grabbing. "it's no longer a pull but more of a fatigue." Pt does note tenderness to palpation in the front of her groin.    Pertinent History Prior back pain    Limitations Standing;House hold activities    How long can you sit comfortably? n/a    How long can you stand comfortably? n/a    How long can you walk comfortably? n/a    Patient  Stated Goals Reduce pain    Currently in Pain? Yes    Pain Score 3     Pain Location Groin    Pain Orientation Left    Pain Descriptors / Indicators Aching    Pain Type Acute pain                [facility] PT Assessment - 05/08/21 0001       Assessment   Medical Diagnosis M16.12 (ICD-10-CM) - Primary osteoarthritis of left hip    Referring Provider (PT) Silverio Decamp, MD                           [facility] Adult PT Treatment/Exercise - 05/08/21 0001       Ambulation/Gait   Ambulation Distance (Feet) --   Around gym, 3 laps   Assistive device None    Gait Pattern Abducted - left;Wide base of support;Lateral hip instability;Trendelenburg    Ambulation Surface Level;Indoor     Gait Comments Cues for heel toe, narrower BOS, decreasing pelvic sway/rotation      Knee/Hip Exercises: Stretches   Hip Flexor Stretch Left;60 seconds    Hip Flexor Stretch Limitations on plinth    Other Knee/Hip Stretches Seated adductor stretch x30 sec      Knee/Hip Exercises: Aerobic   Tread Mill 2.0 mph x 5 min warm up      Knee/Hip Exercises: Sidelying   Hip ABduction Strengthening;Left;20 reps      Knee/Hip Exercises: Prone   Hip Extension Strengthening;Right;Left;1 set;10 reps    Hip Extension Limitations straight leg    Other Prone Exercises forearm plank 2x20 sec; primal push up 2x10 sec    Other Prone Exercises quadruped bird dog 2x10      Manual Therapy   Soft tissue mobilization STM and TPR L hip iliacus and psoas                          PT Long Term Goals - 04/24/21 1708       PT LONG TERM GOAL #1   Title Pt will be independent with HEP    Time 6    Period Weeks    Status New    Target Date 06/05/21      PT LONG TERM GOAL #2   Title Pt will report decrease in L hip pain by at least 75%    Time 6    Period Weeks    Status New    Target Date 06/05/21      PT LONG TERM GOAL #3   Title Pt will be able to perform a flight of stairs with no pain    Time 6    Period Weeks    Status New    Target Date 06/05/21      PT LONG TERM GOAL #4   Title Pt will have improved FOTO score to at least 75    Time 6    Period Weeks    Status New    Target Date 06/05/21      PT LONG TERM GOAL #5   Title Pt will be able to demo at least 4+/5 bilat hip abductor strength    Time 6    Period Weeks    Status New    Target Date 06/05/21                   Plan - 05/08/21  1522     Clinical Impression Statement Bilat iliac crests appear level today. Less groin pain in adductors and more in flexors today -- consider possible tendonitis. Provided stretching and manual therapy; however, plinth hip flexor stretch appeared to be too much today and  made pt sore. Continued to work on hip strengthening this session. Added core strengthening as pt may be overusing L hip flexors to compensate for weakened core. Worked on gait training to improve hip stability with walking.    Personal Factors and Comorbidities Age;Fitness;Time since onset of injury/illness/exacerbation    Examination-Activity Limitations Transfers    Examination-Participation Restrictions Community Activity;Cleaning    Stability/Clinical Decision Making Stable/Uncomplicated    Rehab Potential Good    PT Frequency 2x / week    PT Duration 6 weeks    PT Treatment/Interventions ADLs/Self Care Home Management;Cryotherapy;Electrical Stimulation;Iontophoresis 4mg /ml Dexamethasone;Moist Heat;Gait training;Stair training;Functional mobility training;Therapeutic activities;Therapeutic exercise;Balance training;Neuromuscular re-education;Manual techniques;Patient/family education;Passive range of motion;Dry needling;Taping    PT Next Visit Plan Assess pelvic alignment and address accordingly. Stretch hip. continue core/ pelvis/hip stabilization exercises.    PT Home Exercise Plan Access Code: [email or web address]    Consulted and Agree with Plan of Care Patient             Patient will benefit from skilled therapeutic intervention in order to improve the following deficits and impairments:  Decreased range of motion, Increased fascial restricitons, Decreased activity tolerance, Pain, Decreased mobility, Decreased strength  Visit Diagnosis: Pain in left hip  Stiffness of left hip, not elsewhere classified  Sacrococcygeal disorders, not elsewhere classified  Muscle weakness (generalized)  Other abnormalities of gait and mobility     Problem List Patient Active Problem List   Diagnosis Date Noted   Primary osteoarthritis of left hip 03/23/2021   Family history of osteoporosis 06/12/2018   History of breast cancer 06/12/2018   Advance directive discussed with patient 04/03/2018    Pap smear of cervix declined 04/03/2018   Lumbago 08/16/2017   Basal cell carcinoma 07/17/2017   Vaginismus 07/17/2017   Abnormal TSH 03/28/2017   Diarrhea 11/05/2016   Port catheter in place 10/23/2016   Genetic testing 08/15/2016   Malignant neoplasm of upper-outer quadrant of left breast in female, estrogen receptor positive ([place]) 07/10/2016   Allergic rhinitis 06/13/2016   History of basal cell carcinoma 06/13/2016   Eczema 06/13/2016   Hyperlipemia 06/13/2016   History of colon polyps 06/13/2016   Plantar fascial fibromatosis 06/13/2016   RLS (restless legs syndrome) 06/13/2016   Subclinical hypothyroidism 07/20/2013   Bunion 01/21/2013   Osteopenia 01/20/2013    Sanah [place] April Gordy Levan, PT, DPT 05/08/2021, 3:26 PM  [facility] [place], [name], [address] Phone: [phone]   Fax:  [phone]  Name: Jamie [name] MRN: [id number] Date of Birth: [birth year]

## 2021-05-11 ENCOUNTER — Other Ambulatory Visit: Payer: Self-pay

## 2021-05-11 ENCOUNTER — Ambulatory Visit (INDEPENDENT_AMBULATORY_CARE_PROVIDER_SITE_OTHER): Payer: Medicare Other | Admitting: Physical Therapy

## 2021-05-11 DIAGNOSIS — M6281 Muscle weakness (generalized): Secondary | ICD-10-CM

## 2021-05-11 DIAGNOSIS — M25652 Stiffness of left hip, not elsewhere classified: Secondary | ICD-10-CM

## 2021-05-11 DIAGNOSIS — R2689 Other abnormalities of gait and mobility: Secondary | ICD-10-CM | POA: Diagnosis not present

## 2021-05-11 DIAGNOSIS — M533 Sacrococcygeal disorders, not elsewhere classified: Secondary | ICD-10-CM

## 2021-05-11 DIAGNOSIS — M25552 Pain in left hip: Secondary | ICD-10-CM

## 2021-05-11 NOTE — Therapy (Signed)
[place], [name], [address] Phone: [phone]   Fax:  [phone]  Physical Therapy Treatment  Patient Details  Name: Jamie [name] MRN: [id number] Date of Birth: Jan 11, 1956 Referring Provider (PT): Silverio Decamp, MD   Encounter Date: 05/11/2021   PT End of Session - 05/11/21 1448     Visit Number 6    Number of Visits 12    Date for PT Re-Evaluation 06/05/21    Authorization Type Medicare    Authorization - Visit Number 6    Progress Note Due on Visit 10    PT Start Time 1448    PT Stop Time 1530    PT Time Calculation (min) 42 min    Activity Tolerance Patient tolerated treatment well    Behavior During Therapy [facility] for tasks assessed/performed             Past Medical History:  Diagnosis Date   Allergy    Anemia    Arthritis    feet   Basal cell carcinoma    Breast cancer ([place]) 06/2016   left breast   Colon polyps    Eczema    Genetic testing 08/15/2016   Ms. Pacetti underwent genetic testing for hereditary cancer syndrome through [employer]'s 43-gene Common Hereditary Cancers Panel. Ms. 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 Result report is dated 08/15/2016. Please see genetic counseling documentation from 08/17/2016 for further discussion.   History of radiation therapy 01/02/17-01/31/17   left breast was treated to 42.72 Gy in 16 fractions, left breast boost 10 Gy in 5 fractions   Personal history of chemotherapy    Personal history of radiation therapy    PONV (postoperative nausea and vomiting)     Past Surgical History:  Procedure Laterality Date   BONE SPUR Bilateral 2001 AND 1988   BREAST BIOPSY     BREAST LUMPECTOMY Left    07/2016   BREAST LUMPECTOMY WITH RADIOACTIVE SEED AND SENTINEL LYMPH NODE BIOPSY Left 07/26/2016   Procedure: BREAST LUMPECTOMY WITH RADIOACTIVE SEED AND SENTINEL  LYMPH NODE BIOPSY;  Surgeon: [name], MD;  Location: [place];  Service: General;  Laterality: Left;   BUNIONECTOMY Bilateral 02/2012   COLONOSCOPY W/ POLYPECTOMY  08/2007   DILATION AND CURETTAGE OF UTERUS  2002   MOHS SURGERY  2010   PORT-A-CATH REMOVAL N/A 07/18/2017   Procedure: REMOVAL PORT-A-CATH;  Surgeon: [name], MD;  Location: [place];  Service: General;  Laterality: N/A;   PORTACATH PLACEMENT Right 07/26/2016   Procedure: INSERTION PORT-A-CATH;  Surgeon: [name], MD;  Location: [place];  Service: General;  Laterality: Right;    There were no vitals filed for this visit.   Subjective Assessment - 05/11/21 1450     Subjective Pt states she is still hurting and uncomfortable over night. Last night it wasn't a good night.    Pertinent History Prior back pain    Limitations Standing;House hold activities    How long can you sit comfortably? n/a    How long can you stand comfortably? n/a    How long can you walk comfortably? n/a    Patient Stated Goals Reduce pain    Currently in Pain? Yes    Pain Score 5     Pain Location Groin    Pain Orientation Left    Pain Descriptors / Indicators Aching  Pain Type Acute pain                [facility] PT Assessment - 05/11/21 0001       Assessment   Medical Diagnosis M16.12 (ICD-10-CM) - Primary osteoarthritis of left hip    Referring Provider (PT) Silverio Decamp, MD                           [facility] Adult PT Treatment/Exercise - 05/11/21 0001       Knee/Hip Exercises: Stretches   Passive Hamstring Stretch Left;2 reps;30 seconds    Other Knee/Hip Stretches sidelying QL/ ITB stretch in Rt sidelying with Lt arm overhead x 20 sec x 2 reps      Knee/Hip Exercises: Aerobic   Tread Mill 2.0 mph x 5 min      Knee/Hip Exercises: Supine   Bridges with Ball Squeeze Strengthening;Both;2 sets;10 reps    Bridges with Clamshell Strengthening;Both;10 reps    Other  Supine Knee/Hip Exercises Iso clamshell into belt 3x10"    Other Supine Knee/Hip Exercises Iso ball squeeze 3x10"      Manual Therapy   Manual therapy comments Anterior rotation L iliac crest Hesch method into posterior rotation                          PT Long Term Goals - 04/24/21 1708       PT LONG TERM GOAL #1   Title Pt will be independent with HEP    Time 6    Period Weeks    Status New    Target Date 06/05/21      PT LONG TERM GOAL #2   Title Pt will report decrease in L hip pain by at least 75%    Time 6    Period Weeks    Status New    Target Date 06/05/21      PT LONG TERM GOAL #3   Title Pt will be able to perform a flight of stairs with no pain    Time 6    Period Weeks    Status New    Target Date 06/05/21      PT LONG TERM GOAL #4   Title Pt will have improved FOTO score to at least 75    Time 6    Period Weeks    Status New    Target Date 06/05/21      PT LONG TERM GOAL #5   Title Pt will be able to demo at least 4+/5 bilat hip abductor strength    Time 6    Period Weeks    Status New    Target Date 06/05/21                   Plan - 05/11/21 1557     Clinical Impression Statement Pt demos L anteriorly rotated inominate this session. Improved with posterior rotation METs, manual therapy, and stretching with pt endorsing less pain by end of session and improved alignment. Pt found to have highly immobile L hip IR and adduction. FADIR (+) for impingement. Initiated gentle AROM and joint mobilization.    Personal Factors and Comorbidities Age;Fitness;Time since onset of injury/illness/exacerbation    Examination-Activity Limitations Transfers    Examination-Participation Restrictions Community Activity;Cleaning    Stability/Clinical Decision Making Stable/Uncomplicated    Rehab Potential Good    PT Frequency 2x / week  PT Duration 6 weeks    PT Treatment/Interventions ADLs/Self Care Home Management;Cryotherapy;Electrical  Stimulation;Iontophoresis 4mg /ml Dexamethasone;Moist Heat;Gait training;Stair training;Functional mobility training;Therapeutic activities;Therapeutic exercise;Balance training;Neuromuscular re-education;Manual techniques;Patient/family education;Passive range of motion;Dry needling;Taping    PT Next Visit Plan Assess pelvic alignment and address accordingly. Stretch hip. continue core/ pelvis/hip stabilization exercises.    PT Home Exercise Plan Access Code: [email or web address]    Consulted and Agree with Plan of Care Patient             Patient will benefit from skilled therapeutic intervention in order to improve the following deficits and impairments:  Decreased range of motion, Increased fascial restricitons, Decreased activity tolerance, Pain, Decreased mobility, Decreased strength  Visit Diagnosis: Pain in left hip  Stiffness of left hip, not elsewhere classified  Sacrococcygeal disorders, not elsewhere classified  Muscle weakness (generalized)  Other abnormalities of gait and mobility     Problem List Patient Active Problem List   Diagnosis Date Noted   Primary osteoarthritis of left hip 03/23/2021   Family history of osteoporosis 06/12/2018   History of breast cancer 06/12/2018   Advance directive discussed with patient 04/03/2018   Pap smear of cervix declined 04/03/2018   Lumbago 08/16/2017   Basal cell carcinoma 07/17/2017   Vaginismus 07/17/2017   Abnormal TSH 03/28/2017   Diarrhea 11/05/2016   Port catheter in place 10/23/2016   Genetic testing 08/15/2016   Malignant neoplasm of upper-outer quadrant of left breast in female, estrogen receptor positive ([place]) 07/10/2016   Allergic rhinitis 06/13/2016   History of basal cell carcinoma 06/13/2016   Eczema 06/13/2016   Hyperlipemia 06/13/2016   History of colon polyps 06/13/2016   Plantar fascial fibromatosis 06/13/2016   RLS (restless legs syndrome) 06/13/2016   Subclinical hypothyroidism 07/20/2013   Bunion  01/21/2013   Osteopenia 01/20/2013    Amal Saiki April Gordy Levan, PT, DPT 05/11/2021, 5:40 PM  [facility] [place] [address] [place], [name], [address] Phone: [phone]   Fax:  [phone]  Name: KYRIANNA BARLETTA MRN: [id number] Date of Birth: 1956/03/27

## 2021-05-16 ENCOUNTER — Encounter: Payer: Self-pay | Admitting: Physical Therapy

## 2021-05-16 ENCOUNTER — Other Ambulatory Visit: Payer: Self-pay

## 2021-05-16 ENCOUNTER — Ambulatory Visit (INDEPENDENT_AMBULATORY_CARE_PROVIDER_SITE_OTHER): Payer: Medicare Other | Admitting: Physical Therapy

## 2021-05-16 DIAGNOSIS — M25652 Stiffness of left hip, not elsewhere classified: Secondary | ICD-10-CM

## 2021-05-16 DIAGNOSIS — M25552 Pain in left hip: Secondary | ICD-10-CM

## 2021-05-16 DIAGNOSIS — M6281 Muscle weakness (generalized): Secondary | ICD-10-CM

## 2021-05-16 NOTE — Therapy (Signed)
[place], [name], [address] Phone: [phone]   Fax:  [phone]  Physical Therapy Treatment  Patient Details  Name: Jamie [name] MRN: [id number] Date of Birth: 13-Mar-1956 Referring Provider (PT): Silverio Decamp, MD   Encounter Date: 05/16/2021   PT End of Session - 05/16/21 0917     Visit Number 7    Number of Visits 12    Date for PT Re-Evaluation 06/05/21    Authorization Type Medicare    Authorization - Visit Number 7    Progress Note Due on Visit 10    PT Start Time 0803    PT Stop Time 0853    PT Time Calculation (min) 50 min    Activity Tolerance Patient tolerated treatment well    Behavior During Therapy [facility] for tasks assessed/performed             Past Medical History:  Diagnosis Date   Allergy    Anemia    Arthritis    feet   Basal cell carcinoma    Breast cancer ([place]) 06/2016   left breast   Colon polyps    Eczema    Genetic testing 08/15/2016   Ms. Molina underwent genetic testing for hereditary cancer syndrome through [employer]'s 43-gene Common Hereditary Cancers Panel. Ms. San testing revealed a single pathogenic mutation in MUTYH and a variant of uncertain significance (VUS) in SDHB. Result report is dated 08/15/2016. Please see genetic counseling documentation from 08/17/2016 for further discussion.   History of radiation therapy 01/02/17-01/31/17   left breast was treated to 42.72 Gy in 16 fractions, left breast boost 10 Gy in 5 fractions   Personal history of chemotherapy    Personal history of radiation therapy    PONV (postoperative nausea and vomiting)     Past Surgical History:  Procedure Laterality Date   BONE SPUR Bilateral 2001 AND 1988   BREAST BIOPSY     BREAST LUMPECTOMY Left    07/2016   BREAST LUMPECTOMY WITH RADIOACTIVE SEED AND SENTINEL LYMPH NODE BIOPSY Left 07/26/2016   Procedure: BREAST LUMPECTOMY WITH RADIOACTIVE SEED AND SENTINEL  LYMPH NODE BIOPSY;  Surgeon: [name], MD;  Location: [facility];  Service: General;  Laterality: Left;   BUNIONECTOMY Bilateral 02/2012   COLONOSCOPY W/ POLYPECTOMY  08/2007   DILATION AND CURETTAGE OF UTERUS  2002   MOHS SURGERY  2010   PORT-A-CATH REMOVAL N/A 07/18/2017   Procedure: REMOVAL PORT-A-CATH;  Surgeon: [name], MD;  Location: [facility];  Service: General;  Laterality: N/A;   PORTACATH PLACEMENT Right 07/26/2016   Procedure: INSERTION PORT-A-CATH;  Surgeon: [name], MD;  Location: [facility];  Service: General;  Laterality: Right;    There were no vitals filed for this visit.   Subjective Assessment - 05/16/21 0807     Subjective Pt reports she gets relief with supine Lt knee to chest (but abducted), held until it relaxes.  She reports she is having less pain, but it is still present.  She is careful with certain motions (getting out of car).    Currently in Pain? Yes    Pain Score 4     Pain Location Groin    Pain Orientation Left    Pain Descriptors / Indicators Tightness;Constant;Aching    Pain Radiating Towards wraps around from groin to glute    Aggravating Factors  Rt pivoting    Pain Relieving Factors see above.  [facility] PT Assessment - 05/16/21 0001       Assessment   Medical Diagnosis M16.12 (ICD-10-CM) - Primary osteoarthritis of left hip    Referring Provider (PT) Silverio Decamp, MD      Palpation   SI assessment  Lt ASIS higher than Rt.               [facility] Adult PT Treatment/Exercise - 05/16/21 0001       Knee/Hip Exercises: Stretches   Passive Hamstring Stretch Left;3 reps;30 seconds    Passive Hamstring Stretch Limitations supine with strap, straight knee and bent    Other Knee/Hip Stretches supine adductor stretch (L) with strap.      Knee/Hip Exercises: Aerobic   Tread Mill 2.0 mph x 5 min    Other Aerobic single laps around gym to assess response to exercise/manual  therapy.      Knee/Hip Exercises: Sidelying   Hip ABduction Limitations reviewed LLE, with 5 reps, tactile / VC to correct form      Knee/Hip Exercises: Prone   Other Prone Exercises quadruped hip adductor slides with knee on cushion and other foot on pillow case x 8 reps each leg.      Manual Therapy   Manual therapy comments MET self correction utilizing Lt hip flexor contraction, followed by single leg cobra (RLE off edge of table) x 5 reps    Soft tissue mobilization TPR to Lt adductors and medial hamstring with contract / relax;  STM to bilat hip rotators with passive ER/IR toLE with pt in prone and knee flexed to 90               PT Long Term Goals - 05/16/21 0917       PT LONG TERM GOAL #1   Title Pt will be independent with HEP    Time 6    Period Weeks    Status On-going    Target Date 06/05/21      PT LONG TERM GOAL #2   Title Pt will report decrease in L hip pain by at least 75%    Time 6    Period Weeks    Status On-going    Target Date 06/05/21      PT LONG TERM GOAL #3   Title Pt will be able to perform a flight of stairs with no pain    Time 6    Period Weeks    Status On-going    Target Date 06/05/21      PT LONG TERM GOAL #4   Title Pt will have improved FOTO score to at least 75    Time 6    Period Weeks    Status On-going    Target Date 06/05/21      PT LONG TERM GOAL #5   Title Pt will be able to demo at least 4+/5 bilat hip abductor strength    Time 6    Period Weeks    Status On-going    Target Date 06/05/21                   Plan - 05/16/21 0914     Clinical Impression Statement Pt presents with L posteriorly rotated inominate; some improvement with MET correction and Lt hamstring stretch.  Point tender in Lt adductors; responded well with TPR with contract/relax.  Pt reported less pain upon completion of session than at beginning.  Encouraged her to continue stretches for Lt hamstring, adductors; complete Lt  hip abdct  exercise (with trunk slightly rolled forward), and to avoid clams if they are painful.    Personal Factors and Comorbidities Age;Fitness;Time since onset of injury/illness/exacerbation    Examination-Activity Limitations Transfers    Examination-Participation Restrictions Community Activity;Cleaning    Stability/Clinical Decision Making Stable/Uncomplicated    Rehab Potential Good    PT Frequency 2x / week    PT Duration 6 weeks    PT Treatment/Interventions ADLs/Self Care Home Management;Cryotherapy;Electrical Stimulation;Iontophoresis 36m/ml Dexamethasone;Moist Heat;Gait training;Stair training;Functional mobility training;Therapeutic activities;Therapeutic exercise;Balance training;Neuromuscular re-education;Manual techniques;Patient/family education;Passive range of motion;Dry needling;Taping    PT Next Visit Plan Assess pelvic alignment and address accordingly. Stretch hip. continue core/ pelvis/hip stabilization exercises.    PT Home Exercise Plan Access Code: [email or web address]   Consulted and Agree with Plan of Care Patient             Patient will benefit from skilled therapeutic intervention in order to improve the following deficits and impairments:  Decreased range of motion, Increased fascial restricitons, Decreased activity tolerance, Pain, Decreased mobility, Decreased strength  Visit Diagnosis: Pain in left hip  Stiffness of left hip, not elsewhere classified  Muscle weakness (generalized)     Problem List Patient Active Problem List   Diagnosis Date Noted   Primary osteoarthritis of left hip 03/23/2021   Family history of osteoporosis 06/12/2018   History of breast cancer 06/12/2018   Advance directive discussed with patient 04/03/2018   Pap smear of cervix declined 04/03/2018   Lumbago 08/16/2017   Basal cell carcinoma 07/17/2017   Vaginismus 07/17/2017   Abnormal TSH 03/28/2017   Diarrhea 11/05/2016   Port catheter in place 10/23/2016   Genetic testing  08/15/2016   Malignant neoplasm of upper-outer quadrant of left breast in female, estrogen receptor positive ([place] 07/10/2016   Allergic rhinitis 06/13/2016   History of basal cell carcinoma 06/13/2016   Eczema 06/13/2016   Hyperlipemia 06/13/2016   History of colon polyps 06/13/2016   Plantar fascial fibromatosis 06/13/2016   RLS (restless legs syndrome) 06/13/2016   Subclinical hypothyroidism 07/20/2013   Bunion 01/21/2013   Osteopenia 01/20/2013   JKerin Perna PTA 05/16/21 9:23 AM   [place] [address]Phone: [phone]  Fax:  [phone] Name: Jamie TUFAROMRN: [id number] of Birth: [birth year]

## 2021-05-18 ENCOUNTER — Other Ambulatory Visit: Payer: Self-pay

## 2021-05-18 ENCOUNTER — Ambulatory Visit (INDEPENDENT_AMBULATORY_CARE_PROVIDER_SITE_OTHER): Payer: Medicare Other | Admitting: Physical Therapy

## 2021-05-18 DIAGNOSIS — M25552 Pain in left hip: Secondary | ICD-10-CM

## 2021-05-18 DIAGNOSIS — M6281 Muscle weakness (generalized): Secondary | ICD-10-CM

## 2021-05-18 DIAGNOSIS — M25652 Stiffness of left hip, not elsewhere classified: Secondary | ICD-10-CM

## 2021-05-18 NOTE — Therapy (Signed)
[facility] [place], [name], [address] Phone: [phone]   Fax:  [phone]  Physical Therapy Treatment  Patient Details  Name: Jamie [name] MRN: [id number] Date of Birth: September 05, 1955 Referring Provider (PT): Silverio Decamp, MD   Encounter Date: 05/18/2021   PT End of Session - 05/18/21 1448     Visit Number 8    Number of Visits 12    Date for PT Re-Evaluation 06/05/21    Authorization Type Medicare    Authorization - Visit Number 8    Progress Note Due on Visit 10    PT Start Time 1448    PT Stop Time 1532    PT Time Calculation (min) 44 min    Activity Tolerance Patient tolerated treatment well    Behavior During Therapy [facility] for tasks assessed/performed             Past Medical History:  Diagnosis Date   Allergy    Anemia    Arthritis    feet   Basal cell carcinoma    Breast cancer ([place]) 06/2016   left breast   Colon polyps    Eczema    Genetic testing 08/15/2016   Ms. Halladay underwent genetic testing for hereditary cancer syndrome through [employer]'s 43-gene Common Hereditary Cancers Panel. Ms. Markie testing revealed a single pathogenic mutation in MUTYH and a variant of uncertain significance (VUS) in SDHB. Result report is dated 08/15/2016. Please see genetic counseling documentation from 08/17/2016 for further discussion.   History of radiation therapy 01/02/17-01/31/17   left breast was treated to 42.72 Gy in 16 fractions, left breast boost 10 Gy in 5 fractions   Personal history of chemotherapy    Personal history of radiation therapy    PONV (postoperative nausea and vomiting)     Past Surgical History:  Procedure Laterality Date   BONE SPUR Bilateral 2001 AND 1988   BREAST BIOPSY     BREAST LUMPECTOMY Left    07/2016   BREAST LUMPECTOMY WITH RADIOACTIVE SEED AND SENTINEL LYMPH NODE BIOPSY Left 07/26/2016   Procedure: BREAST LUMPECTOMY WITH RADIOACTIVE SEED AND SENTINEL  LYMPH NODE BIOPSY;  Surgeon: [name], MD;  Location: [facility] [place];  Service: General;  Laterality: Left;   BUNIONECTOMY Bilateral 02/2012   COLONOSCOPY W/ POLYPECTOMY  08/2007   DILATION AND CURETTAGE OF UTERUS  2002   MOHS SURGERY  2010   PORT-A-CATH REMOVAL N/A 07/18/2017   Procedure: REMOVAL PORT-A-CATH;  Surgeon: [name], MD;  Location: [facility];  Service: General;  Laterality: N/A;   PORTACATH PLACEMENT Right 07/26/2016   Procedure: INSERTION PORT-A-CATH;  Surgeon: [name], MD;  Location: [facility];  Service: General;  Laterality: Right;    There were no vitals filed for this visit.   Subjective Assessment - 05/18/21 1456     Subjective Pt reports that she slept well after the last session and had a good day yesterday.  Lt hip feels fatigued after completing senior exercise class at gym. She feels like she is making some progress. She believes she is ready to try DN for the tightness in her groin.   Currently in Pain? Yes    Pain Score 3     Pain Location Hip    Pain Orientation Left;Anterior    Pain Descriptors / Indicators Tightness;Tiring                [facility] PT Assessment - 05/18/21 0001  Assessment   Medical Diagnosis M16.12 (ICD-10-CM) - Primary osteoarthritis of left hip    Referring Provider (PT) Silverio Decamp, MD    Onset Date/Surgical Date --   Spring/summer 2022   Hand Dominance Right    Prior Therapy Several years ago for R leg/back               [facility] Adult PT Treatment/Exercise - 05/18/21 0001       Knee/Hip Exercises: Stretches   Passive Hamstring Stretch Left;3 reps;30 seconds , 1 additional rep at end of session   Passive Hamstring Stretch Limitations supine with strap, straight knee and bent    Other Knee/Hip Stretches supine adductor stretch (L) with strap.      Knee/Hip Exercises: Aerobic   Tread Mill 1.38mph x 4.5 min for warm up    Other Aerobic single laps around gym to assess  response to exercise/manual therapy.      Knee/Hip Exercises: Standing   Forward Step Up Limitations Rt step ups, with high knee on Lt x 10; repeated on RLE - 6" step, BUE on rails. initially painful, but pain reduced with inc repetitions    Other Standing Knee Exercises resisted single leg clam with foot on wall x 10 reps. Resisted side stepping with green band on ankles x 10 ft R/L      Knee/Hip Exercises: Supine   Bridges Strengthening;1 set;10 reps   5 sec hold in ext. yoga strap on thighs with abdct.     Knee/Hip Exercises: Prone   Other Prone Exercises quadruped Lt hip adductor slides with Rt knee on cushion and Lt foot on pillow case x 10 reps.      Manual Therapy   Soft tissue mobilization TPR to Lt adductors and medial hamstring with contract / relax with pt in hooklying with LLE suported in butterfly position.              PT Long Term Goals - 05/18/21 1501       PT LONG TERM GOAL #1   Title Pt will be independent with HEP    Time 6    Period Weeks    Status On-going    Target Date 06/05/21      PT LONG TERM GOAL #2   Title Pt will report decrease in L hip pain by at least 75%    Baseline 20% reported on 05/18/21    Time 6    Period Weeks    Status On-going    Target Date 06/05/21      PT LONG TERM GOAL #3   Title Pt will be able to perform a flight of stairs with no pain    Time 6    Period Weeks    Status On-going    Target Date 06/05/21      PT LONG TERM GOAL #4   Title Pt will have improved FOTO score to at least 75    Time 6    Period Weeks    Status On-going    Target Date 06/05/21      PT LONG TERM GOAL #5   Title Pt will be able to demo at least 4+/5 bilat hip abductor strength    Time 6    Period Weeks    Status On-going    Target Date 06/05/21                   Plan - 05/18/21 1801     Clinical Impression Statement  Pt continues to present with Lt posteriorly rotated inominate and has some improvement in alignment with supine  hamstring stretch.  She was unable to tolerate recumbant bike, "pinching feeling" in Lt ant hip - treadmill is preferred aerobic equip.  Pt tolerated most exercises noting reduction of pain at end of session. Palpable tightness in Lt adductors and medial hamstrings; improves with STM. May benefit from DN to this area in future visit. Progressing gradually towards goals.    Personal Factors and Comorbidities Age;Fitness;Time since onset of injury/illness/exacerbation    Examination-Activity Limitations Transfers    Examination-Participation Restrictions Community Activity;Cleaning    Stability/Clinical Decision Making Stable/Uncomplicated    Rehab Potential Good    PT Frequency 2x / week    PT Duration 6 weeks    PT Treatment/Interventions ADLs/Self Care Home Management;Cryotherapy;Electrical Stimulation;Iontophoresis 4mg /ml Dexamethasone;Moist Heat;Gait training;Stair training;Functional mobility training;Therapeutic activities;Therapeutic exercise;Balance training;Neuromuscular re-education;Manual techniques;Patient/family education;Passive range of motion;Dry needling;Taping    PT Next Visit Plan Assess pelvic alignment and address accordingly. Stretch hip. continue core/ pelvis/hip stabilization exercises.    PT Home Exercise Plan Access Code: [email or web address]    Consulted and Agree with Plan of Care Patient             Patient will benefit from skilled therapeutic intervention in order to improve the following deficits and impairments:  Decreased range of motion, Increased fascial restricitons, Decreased activity tolerance, Pain, Decreased mobility, Decreased strength  Visit Diagnosis: Pain in left hip  Stiffness of left hip, not elsewhere classified  Muscle weakness (generalized)     Problem List Patient Active Problem List   Diagnosis Date Noted   Primary osteoarthritis of left hip 03/23/2021   Family history of osteoporosis 06/12/2018   History of breast cancer 06/12/2018    Advance directive discussed with patient 04/03/2018   Pap smear of cervix declined 04/03/2018   Lumbago 08/16/2017   Basal cell carcinoma 07/17/2017   Vaginismus 07/17/2017   Abnormal TSH 03/28/2017   Diarrhea 11/05/2016   Port catheter in place 10/23/2016   Genetic testing 08/15/2016   Malignant neoplasm of upper-outer quadrant of left breast in female, estrogen receptor positive ([place]) 07/10/2016   Allergic rhinitis 06/13/2016   History of basal cell carcinoma 06/13/2016   Eczema 06/13/2016   Hyperlipemia 06/13/2016   History of colon polyps 06/13/2016   Plantar fascial fibromatosis 06/13/2016   RLS (restless legs syndrome) 06/13/2016   Subclinical hypothyroidism 07/20/2013   Bunion 01/21/2013   Osteopenia 01/20/2013   Kerin Perna, PTA 05/18/21 6:08 PM  [facility] [name], [name], [address] Phone: [phone]   Fax:  [phone]  Name: RHYAN WOLTERS MRN: [id number] Date of Birth: [birth year]

## 2021-05-23 ENCOUNTER — Other Ambulatory Visit: Payer: Self-pay

## 2021-05-23 ENCOUNTER — Ambulatory Visit: Payer: Medicare Other | Attending: Sports Medicine | Admitting: Physical Therapy

## 2021-05-23 DIAGNOSIS — R2689 Other abnormalities of gait and mobility: Secondary | ICD-10-CM | POA: Insufficient documentation

## 2021-05-23 DIAGNOSIS — M6281 Muscle weakness (generalized): Secondary | ICD-10-CM | POA: Diagnosis not present

## 2021-05-23 DIAGNOSIS — M25552 Pain in left hip: Secondary | ICD-10-CM | POA: Insufficient documentation

## 2021-05-23 DIAGNOSIS — M25652 Stiffness of left hip, not elsewhere classified: Secondary | ICD-10-CM | POA: Diagnosis not present

## 2021-05-23 DIAGNOSIS — M533 Sacrococcygeal disorders, not elsewhere classified: Secondary | ICD-10-CM | POA: Diagnosis not present

## 2021-05-23 NOTE — Patient Instructions (Signed)
Aquatic Therapy: What to Expect! ° °Where:  °[facility] [address] °[address] °[place], [place] [address] °[phone]          ° °How to Prepare: ° °If you require assistance with dressing, with transportation (ie: wheel chair), or toileting, a caregiver must attend the entire session with you (unless your primary therapists feels this is not necessary).   °If there is thunder during your appointment, you will be asked to leave pool area. You have the option to finish your session in the physical therapy area near the gym. °Masks in the pool area are optional. Your face will remain dry during your session, so you are welcome to keep your mask on, if desired. You will be spaced at least 6 feet from other aquatic patients.  °Please bring your own swim towel to dry off with.   °There are Men's and Women's locker rooms with showers, as well as gender neutral bathrooms in the pool area.  °Please arrive IN YOUR SUIT and a few minutes prior to your appointment - this helps to avoid delays in starting your session. °Head to the pool and await your appointment on the bench on the pool deck. °Please make sure to attend to any toileting needs prior to entering the pool. °Once on the pool deck your therapist will ask you to sign the Patient  Consent and Assignment of Benefits form. °Your therapist may take your blood pressure prior to, during and after your session if indicated. °We usually try and create a home exercise program based on activities we do in the pool. Some patients do not want to or do not have the ability to participate in an aquatic home program - this is not a barrier in any way to you participating in aquatic therapy as part of your current therapy plan! ° °Appointments:  All sessions are 45 minutes ° °About the pool: °Entering the pool: °Your therapist will assist you if needed; there are two ways to enter the pool - stairs or a mechanical lift. Your therapist will determine  the most appropriate way for you. °Water temperature is usually around 91-95°.  There is a lap pool with a temperature around 84° °There may be other therapists and patients in the pool at the same time.  ° °Contact Info:            °To cancel appointment, please call [facility] at [phone] °If you are running late, please call [facility] at [phone]    °        °     ° ° °

## 2021-05-23 NOTE — Therapy (Signed)
[place], [name], [address] Phone: [phone]   Fax:  [phone]  Physical Therapy Treatment  Patient Details  Name: Jamie [name] MRN: [id number] Date of Birth: 14-Apr-1956 Referring Provider (PT): Silverio Decamp, MD   Encounter Date: 05/23/2021   PT End of Session - 05/23/21 0843     Visit Number 9    Number of Visits 12    Date for PT Re-Evaluation 06/05/21    Authorization Type Medicare    Authorization - Visit Number 9    Progress Note Due on Visit 10    PT Start Time 0846    PT Stop Time 0928    PT Time Calculation (min) 42 min    Activity Tolerance Patient tolerated treatment well    Behavior During Therapy [facility] for tasks assessed/performed             Past Medical History:  Diagnosis Date   Allergy    Anemia    Arthritis    feet   Basal cell carcinoma    Breast cancer ([place]) 06/2016   left breast   Colon polyps    Eczema    Genetic testing 08/15/2016   Ms. Eakes underwent genetic testing for hereditary cancer syndrome through [employer]'s 43-gene Common Hereditary Cancers Panel. Ms. 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 Result report is dated 08/15/2016. Please see genetic counseling documentation from 08/17/2016 for further discussion.   History of radiation therapy 01/02/17-01/31/17   left breast was treated to 42.72 Gy in 16 fractions, left breast boost 10 Gy in 5 fractions   Personal history of chemotherapy    Personal history of radiation therapy    PONV (postoperative nausea and vomiting)     Past Surgical History:  Procedure Laterality Date   BONE SPUR Bilateral 2001 AND 1988   BREAST BIOPSY     BREAST LUMPECTOMY Left    07/2016   BREAST LUMPECTOMY WITH RADIOACTIVE SEED AND SENTINEL LYMPH NODE BIOPSY Left 07/26/2016   Procedure: BREAST LUMPECTOMY WITH RADIOACTIVE SEED AND SENTINEL  LYMPH NODE BIOPSY;  Surgeon: [name], MD;  Location: [place];  Service: General;  Laterality: Left;   BUNIONECTOMY Bilateral 02/2012   COLONOSCOPY W/ POLYPECTOMY  08/2007   DILATION AND CURETTAGE OF UTERUS  2002   MOHS SURGERY  2010   PORT-A-CATH REMOVAL N/A 07/18/2017   Procedure: REMOVAL PORT-A-CATH;  Surgeon: [name], MD;  Location: [place];  Service: General;  Laterality: N/A;   PORTACATH PLACEMENT Right 07/26/2016   Procedure: INSERTION PORT-A-CATH;  Surgeon: [name], MD;  Location: [place];  Service: General;  Laterality: Right;    There were no vitals filed for this visit.   Subjective Assessment - 05/23/21 0850     Subjective Pt reports she is checking her alignment first thing in morning and is trying to correct.  She is still having catching/ grabbing sensation in front of Lt hip but "not as severe".    Pertinent History Prior back pain    Currently in Pain? Yes    Pain Score 2     Pain Location Hip    Pain Orientation Left;Anterior    Pain Descriptors / Indicators Tightness;Aching    Aggravating Factors  Rt pivoting    Pain Relieving Factors massage, stretches.  [facility] PT Assessment - 05/23/21 0001   ° °  ° Assessment  ° Medical Diagnosis M16.12 (ICD-10-CM) - Primary osteoarthritis of left hip   ° Referring Provider (PT) Thekkekandam, Thomas J, MD   ° Onset Date/Surgical Date --   Spring/summer 2022  ° Hand Dominance Right   ° Prior Therapy Several years ago for R leg/back   °  ° Palpation  ° SI assessment  Lt ASIS slightly higher than Rt.   ° °  °  ° °  ° ° ° [facility] Adult PT Treatment/Exercise - 05/23/21 0001   ° °  ° Knee/Hip Exercises: Stretches  ° Passive Hamstring Stretch Left;3 reps;30 seconds   ° Passive Hamstring Stretch Limitations supine with strap, straight knee and bent   °  ° Knee/Hip Exercises: Aerobic  ° Tread Mill 1.8-2.2 mph x 5 min for warm up   ° Other Aerobic single laps around gym to assess  response to exercise/manual therapy.   °  ° Knee/Hip Exercises: Standing  ° Lateral Step Up Left;1 set;10 reps;Right;Hand Hold: 1;Step Height: 6"   with hip abdct of opp leg after step up.  ° Forward Step Up Limitations Rt step ups, with high knee on Lt x 10;  6" step,   ° Other Standing Knee Exercises resisted single leg clam with foot on wall x 10 reps, 2 sets each leg. green band on thighs.   ° Other Standing Knee Exercises single leg adductor slides, leaning forearms on counter, sliding foot on pillow case, 2 sets of 5 on each leg.   °  ° Manual Therapy  ° Manual therapy comments MET self correction utilizing Lt hip flexor contraction, followed by single leg cobra (RLE off edge of table) x 5 reps   ° Soft tissue mobilization MFR to Lt lateral quad, TPR to Lt adductors (more proximal), mid, medial hamstring.   ° °  °  ° °  ° ° ° ° ° ° ° ° ° ° ° ° ° ° ° PT Long Term Goals - 05/18/21 1501   ° °  ° PT LONG TERM GOAL #1  ° Title Pt will be independent with HEP   ° Time 6   ° Period Weeks   ° Status On-going   ° Target Date 06/05/21   °  ° PT LONG TERM GOAL #2  ° Title Pt will report decrease in L hip pain by at least 75%   ° Baseline 20% reported on 05/18/21   ° Time 6   ° Period Weeks   ° Status On-going   ° Target Date 06/05/21   °  ° PT LONG TERM GOAL #3  ° Title Pt will be able to perform a flight of stairs with no pain   ° Time 6   ° Period Weeks   ° Status On-going   ° Target Date 06/05/21   °  ° PT LONG TERM GOAL #4  ° Title Pt will have improved FOTO score to at least 75   ° Time 6   ° Period Weeks   ° Status On-going   ° Target Date 06/05/21   °  ° PT LONG TERM GOAL #5  ° Title Pt will be able to demo at least 4+/5 bilat hip abductor strength   ° Time 6   ° Period Weeks   ° Status On-going   ° Target Date 06/05/21   ° °  °  ° °  ° ° ° ° ° ° ° °   Plan - 05/23/21 0856     Clinical Impression Statement Alignment of pelvis gradually improving; continues with posteriorly rotated Lt inominate.  Improved aligment  after HS stretch and self MET correction.  She tolerated hip strengthening exercises well.  Trigger points found in Lt adductors (especially pectineus and gracilus), lateral mid quad and medial hamstrings. Responded well to TPR.    Personal Factors and Comorbidities Age;Fitness;Time since onset of injury/illness/exacerbation    Examination-Activity Limitations Transfers    Examination-Participation Restrictions Community Activity;Cleaning    Stability/Clinical Decision Making Stable/Uncomplicated    Rehab Potential Good    PT Frequency 2x / week    PT Duration 6 weeks    PT Treatment/Interventions ADLs/Self Care Home Management;Cryotherapy;Electrical Stimulation;Iontophoresis 72m/ml Dexamethasone;Moist Heat;Gait training;Stair training;Functional mobility training;Therapeutic activities;Therapeutic exercise;Balance training;Neuromuscular re-education;Manual techniques;Patient/family education;Passive range of motion;Dry needling;Taping    PT Next Visit Plan Assess pelvic alignment and address accordingly. Stretch hip. continue core/ pelvis/hip stabilization exercises.  DN.  10th VISIT note.    PT Home Exercise Plan Access Code: [email or web address]   Consulted and Agree with Plan of Care Patient             Patient will benefit from skilled therapeutic intervention in order to improve the following deficits and impairments:  Decreased range of motion, Increased fascial restricitons, Decreased activity tolerance, Pain, Decreased mobility, Decreased strength  Visit Diagnosis: Pain in left hip  Stiffness of left hip, not elsewhere classified  Muscle weakness (generalized)     Problem List Patient Active Problem List   Diagnosis Date Noted   Primary osteoarthritis of left hip 03/23/2021   Family history of osteoporosis 06/12/2018   History of breast cancer 06/12/2018   Advance directive discussed with patient 04/03/2018   Pap smear of cervix declined 04/03/2018   Lumbago 08/16/2017   Basal  cell carcinoma 07/17/2017   Vaginismus 07/17/2017   Abnormal TSH 03/28/2017   Diarrhea 11/05/2016   Port catheter in place 10/23/2016   Genetic testing 08/15/2016   Malignant ne[place] of upper-outer quadrant of [place] breast in [place] 07/10/2016   Allergic rhinitis 06/13/2016   History of basal cell carcinoma 06/13/2016   Eczema 06/13/2016   Hyperlipemia 06/13/2016   History of colon polyps 06/13/2016   Plantar fascial fibromatosis 06/13/2016   RLS (restless legs syndrome) 06/13/2016   Subclinical hypothyroidism 07/20/2013   Bunion 01/21/2013   Osteopenia 01/20/2013   JKerin Perna PTA 05/23/21 11:48 AM  [place] [address]Phone: [phone]  Fax:  [phone] Name: ERENATA GAMBINOMRN: [id number] of Birth: [birth year]

## 2021-05-25 ENCOUNTER — Other Ambulatory Visit: Payer: Self-pay

## 2021-05-25 ENCOUNTER — Ambulatory Visit: Payer: Medicare Other | Admitting: Physical Therapy

## 2021-05-25 DIAGNOSIS — M25652 Stiffness of left hip, not elsewhere classified: Secondary | ICD-10-CM

## 2021-05-25 DIAGNOSIS — M25552 Pain in left hip: Secondary | ICD-10-CM | POA: Diagnosis not present

## 2021-05-25 DIAGNOSIS — R2689 Other abnormalities of gait and mobility: Secondary | ICD-10-CM

## 2021-05-25 DIAGNOSIS — M533 Sacrococcygeal disorders, not elsewhere classified: Secondary | ICD-10-CM

## 2021-05-25 DIAGNOSIS — M6281 Muscle weakness (generalized): Secondary | ICD-10-CM

## 2021-05-25 NOTE — Therapy (Signed)
[place], [name], [address] Phone: [phone]   Fax:  [phone]  Physical Therapy Treatment and Progress Note  Patient Details  Name: Jamie [name] MRN: [id number] Date of Birth: 1955-11-06 Referring Provider (PT): Silverio Decamp, MD  Progress Note Reporting Period 04/24/2021 to 05/25/2021  See note below for Objective Data and Assessment of Progress/Goals.      Encounter Date: 05/25/2021   PT End of Session - 05/25/21 1528     Visit Number 10    Number of Visits 12    Date for PT Re-Evaluation 06/05/21    Authorization Type Medicare    Authorization - Visit Number 10    Progress Note Due on Visit 10    PT Start Time 1528    PT Stop Time 1615    PT Time Calculation (min) 47 min    Activity Tolerance Patient tolerated treatment well    Behavior During Therapy [facility] for tasks assessed/performed             Past Medical History:  Diagnosis Date   Allergy    Anemia    Arthritis    feet   Basal cell carcinoma    Breast cancer ([place]) 06/2016   left breast   Colon polyps    Eczema    Genetic testing 08/15/2016   Ms. Errickson underwent genetic testing for hereditary cancer syndrome through [employer]'s 43-gene Common Hereditary Cancers Panel. Ms. 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 Result report is dated 08/15/2016. Please see genetic counseling documentation from 08/17/2016 for further discussion.   History of radiation therapy 01/02/17-01/31/17   left breast was treated to 42.72 Gy in 16 fractions, left breast boost 10 Gy in 5 fractions   Personal history of chemotherapy    Personal history of radiation therapy    PONV (postoperative nausea and vomiting)     Past Surgical History:  Procedure Laterality Date   BONE SPUR Bilateral 2001 AND 1988   BREAST BIOPSY     BREAST LUMPECTOMY Left    07/2016    BREAST LUMPECTOMY WITH RADIOACTIVE SEED AND SENTINEL LYMPH NODE BIOPSY Left 07/26/2016   Procedure: BREAST LUMPECTOMY WITH RADIOACTIVE SEED AND SENTINEL LYMPH NODE BIOPSY;  Surgeon: [name], MD;  Location: [place];  Service: General;  Laterality: Left;   BUNIONECTOMY Bilateral 02/2012   COLONOSCOPY W/ POLYPECTOMY  08/2007   DILATION AND CURETTAGE OF UTERUS  2002   MOHS SURGERY  2010   PORT-A-CATH REMOVAL N/A 07/18/2017   Procedure: REMOVAL PORT-A-CATH;  Surgeon: [name], MD;  Location: [place];  Service: General;  Laterality: N/A;   PORTACATH PLACEMENT Right 07/26/2016   Procedure: INSERTION PORT-A-CATH;  Surgeon: [name], MD;  Location: [place];  Service: General;  Laterality: Right;    There were no vitals filed for this visit.   Subjective Assessment - 05/25/21 1531     Subjective "Doing pretty good. I've been laying down and I can tell how the hips are aligned. I have exercises that seem to really work that out if I'm able to do them during the day. I've been doing a lot of hamstring stretching. I've been feeling fairly good this morning but I went to the gym and it's feeling sore now." Pt states she did a lot of step ups at the gym.    Pertinent History Prior back pain  Currently in Pain? Yes    Pain Score 3     Pain Location Hip    Pain Orientation Left;Anterior    Pain Descriptors / Indicators Tightness;Aching    Pain Type Acute pain                               [facility] Adult PT Treatment/Exercise - 05/25/21 0001       Ambulation/Gait   Gait Comments Amb in between to determine response      Knee/Hip Exercises: Stretches   Passive Hamstring Stretch Left;3 reps;30 seconds    Passive Hamstring Stretch Limitations supine with strap, straight knee and bent    Other Knee/Hip Stretches supine adductor stretch (L) with strap 2x30 sec      Knee/Hip Exercises: Standing   Other Standing Knee Exercises resisted  single leg clam with foot on wall x 10 reps, 2 sets each leg. green band on thighs.    Other Standing Knee Exercises single leg adductor slides, leaning forearms on counter, sliding foot on pillow case, 2 sets of 8 on each leg.      Knee/Hip Exercises: Seated   Other Seated Knee/Hip Exercises Reverse clam red tband 2x10    Other Seated Knee/Hip Exercises On pball marching for core stabilization      Knee/Hip Exercises: Supine   Other Supine Knee/Hip Exercises PPT with marching x10      Manual Therapy   Manual Therapy Soft tissue mobilization    Manual therapy comments skilled assessment and palpation for TPDN and STM    Soft tissue mobilization STM and MFR L adductors (magnus, longus and brevis)              Trigger Point Dry Needling - 05/25/21 0001     Consent Given? Yes    Education Handout Provided Previously provided    Muscles Treated Lower Quadrant Adductor longus/brevis/magnus    Adductor Response Twitch response elicited;Palpable increased muscle length                        PT Long Term Goals - 05/25/21 1610       PT LONG TERM GOAL #1   Title Pt will be independent with HEP    Time 6    Period Weeks    Status Achieved    Target Date 06/05/21      PT LONG TERM GOAL #2   Title Pt will report decrease in L hip pain by at least 75%    Baseline 50% on 05/25/21    Time 6    Period Weeks    Status On-going    Target Date 06/05/21      PT LONG TERM GOAL #3   Title Pt will be able to perform a flight of stairs with no pain    Time 6    Period Weeks    Status Partially Met    Target Date 06/05/21      PT LONG TERM GOAL #4   Title Pt will have improved FOTO score to at least 75    Time 6    Period Weeks    Status On-going    Target Date 06/05/21      PT LONG TERM GOAL #5   Title Pt will be able to demo at least 4+/5 bilat hip abductor strength    Time 6    Period Weeks  Status On-going    Target Date 06/05/21                    Plan - 05/25/21 1629     Clinical Impression Statement Continued hamstring and adductor stretching and strengthening. Added hip IR strengthening as well as pt is very weak when performing this. Provided TPDN this session to adductors distal and mid. Worked and educated pt on utilizing core vs hip flexors. Pt is making good progress towards goals.    Personal Factors and Comorbidities Age;Fitness;Time since onset of injury/illness/exacerbation    Examination-Activity Limitations Transfers    Examination-Participation Restrictions Community Activity;Cleaning    Stability/Clinical Decision Making Stable/Uncomplicated    Rehab Potential Good    PT Frequency 2x / week    PT Duration 6 weeks    PT Treatment/Interventions ADLs/Self Care Home Management;Cryotherapy;Electrical Stimulation;Iontophoresis 69m/ml Dexamethasone;Moist Heat;Gait training;Stair training;Functional mobility training;Therapeutic activities;Therapeutic exercise;Balance training;Neuromuscular re-education;Manual techniques;Patient/family education;Passive range of motion;Dry needling;Taping    PT Next Visit Plan Assess pelvic alignment and address accordingly. Stretch hip. continue core/ pelvis/hip stabilization exercises.  DN.  10th VISIT note.    PT Home Exercise Plan Access Code: [email or web address]   Consulted and Agree with Plan of Care Patient             Patient will benefit from skilled therapeutic intervention in order to improve the following deficits and impairments:  Decreased range of motion, Increased fascial restricitons, Decreased activity tolerance, Pain, Decreased mobility, Decreased strength  Visit Diagnosis: Pain in left hip  Stiffness of left hip, not elsewhere classified  Muscle weakness (generalized)  Sacrococcygeal disorders, not elsewhere classified  Other abnormalities of gait and mobility     Problem List Patient Active Problem List   Diagnosis Date Noted   Primary osteoarthritis  of left hip 03/23/2021   Family history of osteoporosis 06/12/2018   History of breast cancer 06/12/2018   Advance directive discussed with patient 04/03/2018   Pap smear of cervix declined 04/03/2018   Lumbago 08/16/2017   Basal cell carcinoma 07/17/2017   Vaginismus 07/17/2017   Abnormal TSH 03/28/2017   Diarrhea 11/05/2016   Port catheter in place 10/23/2016   Genetic testing 08/15/2016   Malignant ne[place] of upper-outer quadrant of left breast in [place], [place] 07/10/2016   Allergic rhinitis 06/13/2016   History of basal cell carcinoma 06/13/2016   Eczema 06/13/2016   Hyperlipemia 06/13/2016   History of colon polyps 06/13/2016   Plantar fascial fibromatosis 06/13/2016   RLS (restless legs syndrome) 06/13/2016   Subclinical hypothyroidism 07/20/2013   Bunion 01/21/2013   Osteopenia 01/20/2013    Makylie Rivere April MGordy Levan PT, DPT 05/25/2021, 4:32 PM  [facility] [place] [address]Phone: [phone]  Fax:  [phone] Name: EARTIE TAKAYAMAMRN: [id number] of Birth: 8January 22, 1957

## 2021-05-30 ENCOUNTER — Ambulatory Visit: Payer: Medicare Other | Admitting: Rehabilitative and Restorative Service Providers"

## 2021-05-30 ENCOUNTER — Other Ambulatory Visit: Payer: Self-pay

## 2021-05-30 ENCOUNTER — Encounter: Payer: Self-pay | Admitting: Rehabilitative and Restorative Service Providers"

## 2021-05-30 DIAGNOSIS — M6281 Muscle weakness (generalized): Secondary | ICD-10-CM

## 2021-05-30 DIAGNOSIS — M25652 Stiffness of left hip, not elsewhere classified: Secondary | ICD-10-CM

## 2021-05-30 DIAGNOSIS — R2689 Other abnormalities of gait and mobility: Secondary | ICD-10-CM

## 2021-05-30 DIAGNOSIS — M533 Sacrococcygeal disorders, not elsewhere classified: Secondary | ICD-10-CM

## 2021-05-30 DIAGNOSIS — M25552 Pain in left hip: Secondary | ICD-10-CM | POA: Diagnosis not present

## 2021-05-30 NOTE — Patient Instructions (Signed)

## 2021-05-30 NOTE — Therapy (Signed)
[place], [name], [address] Phone: [phone]   Fax:  [phone]  Physical Therapy Treatment  Patient Details  Name: Jamie [name] MRN: [id number] Date of Birth: 10-Sep-1955 Referring Provider (PT): Silverio Decamp, MD   Encounter Date: 05/30/2021   PT End of Session - 05/30/21 1015     Visit Number 11    Number of Visits 12    Date for PT Re-Evaluation 06/05/21    Authorization Type Medicare    Authorization - Visit Number 11    Progress Note Due on Visit 20    PT Start Time 1016    PT Stop Time 1104    PT Time Calculation (min) 48 min    Activity Tolerance Patient tolerated treatment well             Past Medical History:  Diagnosis Date   Allergy    Anemia    Arthritis    feet   Basal cell carcinoma    Breast cancer ([place]) 06/2016   left breast   Colon polyps    Eczema    Genetic testing 08/15/2016   Ms. Poblano underwent genetic testing for hereditary cancer syndrome through [employer]'s 43-gene Common Hereditary Cancers Panel. Ms. 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 Result report is dated 08/15/2016. Please see genetic counseling documentation from 08/17/2016 for further discussion.   History of radiation therapy 01/02/17-01/31/17   left breast was treated to 42.72 Gy in 16 fractions, left breast boost 10 Gy in 5 fractions   Personal history of chemotherapy    Personal history of radiation therapy    PONV (postoperative nausea and vomiting)     Past Surgical History:  Procedure Laterality Date   BONE SPUR Bilateral 2001 AND 1988   BREAST BIOPSY     BREAST LUMPECTOMY Left    07/2016   BREAST LUMPECTOMY WITH RADIOACTIVE SEED AND SENTINEL LYMPH NODE BIOPSY Left 07/26/2016   Procedure: BREAST LUMPECTOMY WITH RADIOACTIVE SEED AND SENTINEL LYMPH NODE BIOPSY;  Surgeon: [name], MD;  Location:  [facility];  Service: General;  Laterality: Left;   BUNIONECTOMY Bilateral 02/2012   COLONOSCOPY W/ POLYPECTOMY  08/2007   DILATION AND CURETTAGE OF UTERUS  2002   MOHS SURGERY  2010   PORT-A-CATH REMOVAL N/A 07/18/2017   Procedure: REMOVAL PORT-A-CATH;  Surgeon: [name], MD;  Location: [facility];  Service: General;  Laterality: N/A;   PORTACATH PLACEMENT Right 07/26/2016   Procedure: INSERTION PORT-A-CATH;  Surgeon: [name], MD;  Location: [facility];  Service: General;  Laterality: Right;    There were no vitals filed for this visit.   Subjective Assessment - 05/30/21 1020     Subjective Cotninued pain in the Lt hip which is sometimes more than others. Noted some "vibration" in the thigh where needled last week which lasted a couple of days. That has now resolved. She continues to have tightness in the anterior hip and inner thigh. She had increased pain last night.    Currently in Pain? Yes    Pain Score 4     Pain Location Hip    Pain Orientation Left;Anterior    Pain Descriptors / Indicators Discomfort    Pain Type Acute pain  [facility] Adult PT Treatment/Exercise - 05/30/21 0001       Knee/Hip Exercises: Stretches   Hip Flexor Stretch Left;3 reps;30 seconds    Hip Flexor Stretch Limitations sittingVC to tighten core      Knee/Hip Exercises: Aerobic   Tread Mill 1.8-2.2 mph x 5 min for warm up    Other Aerobic backwards walking x 30-40 ft x 4 trials      Moist Heat Therapy   Number Minutes Moist Heat 10 Minutes    Moist Heat Location Hip   anterior     Electrical Stimulation   Electrical Stimulation Location anterior hip; proximal adductors Lt LE    Electrical Stimulation Action TENS    Electrical Stimulation Parameters to tolerance    Electrical Stimulation Goals Pain;Tone      Manual Therapy   Manual therapy comments skilled palpation to assess response to Dn and manual work    Soft  tissue mobilization deep tissue work through the Lt hip flexors/psoas/hip flexors; hip adductors              Trigger Point Dry Needling - 05/30/21 0001     Consent Given? Yes    Education Handout Provided Previously provided    Electrical Stimulation Performed with Dry Needling Yes    E-stim with Dry Needling Details mAmp x 5 min; microcurrent x 5 min hip adductors    Adductor Response Palpable increased muscle length;Twitch response elicited    Iliopsoas Response Palpable increased muscle length                   PT Education - 05/30/21 1048     Education Details seated hip flexor stretch; walking backwards; TENS    Person(s) Educated Patient    Methods Explanation;Demonstration;Tactile cues;Verbal cues;Handout    Comprehension Verbalized understanding;Returned demonstration;Verbal cues required;Tactile cues required                 PT Long Term Goals - 05/25/21 1610       PT LONG TERM GOAL #1   Title Pt will be independent with HEP    Time 6    Period Weeks    Status Achieved    Target Date 06/05/21      PT LONG TERM GOAL #2   Title Pt will report decrease in L hip pain by at least 75%    Baseline 50% on 05/25/21    Time 6    Period Weeks    Status On-going    Target Date 06/05/21      PT LONG TERM GOAL #3   Title Pt will be able to perform a flight of stairs with no pain    Time 6    Period Weeks    Status Partially Met    Target Date 06/05/21      PT LONG TERM GOAL #4   Title Pt will have improved FOTO score to at least 75    Time 6    Period Weeks    Status On-going    Target Date 06/05/21      PT LONG TERM GOAL #5   Title Pt will be able to demo at least 4+/5 bilat hip abductor strength    Time 6    Period Weeks    Status On-going    Target Date 06/05/21                   Plan - 05/30/21 1024     Clinical Impression  Statement Persistent pain with variable intensity. She has persistnet tightness through Lt hip  flexors. Added sitting hip flexor stretch and backward walking. Discussed position for sitting. Will try get out of the recliner and sit in more supported position without crossing legs. Tolerated DN with some anxiety. Good release of muscular tightness and would benefit from continued DN to address muscular tightness in hip flexors and adductors.    Rehab Potential Good    PT Frequency 2x / week    PT Duration 6 weeks    PT Treatment/Interventions ADLs/Self Care Home Management;Cryotherapy;Electrical Stimulation;Iontophoresis 67m/ml Dexamethasone;Moist Heat;Gait training;Stair training;Functional mobility training;Therapeutic activities;Therapeutic exercise;Balance training;Neuromuscular re-education;Manual techniques;Patient/family education;Passive range of motion;Dry needling;Taping    PT Next Visit Plan Assess pelvic alignment and address accordingly. Stretch hip. continue core/ pelvis/hip stabilization exercises.  DN.    PT Home Exercise Plan [id number]   Consulted and Agree with Plan of Care Patient             Patient will benefit from skilled therapeutic intervention in order to improve the following deficits and impairments:     Visit Diagnosis: Pain in left hip  Stiffness of left hip, not elsewhere classified  Muscle weakness (generalized)  Sacrococcygeal disorders, not elsewhere classified  Other abnormalities of gait and mobility     Problem List Patient Active Problem List   Diagnosis Date Noted   Primary osteoarthritis of left hip 03/23/2021   Family history of osteoporosis 06/12/2018   History of breast cancer 06/12/2018   Advance directive discussed with patient 04/03/2018   Pap smear of cervix declined 04/03/2018   Lumbago 08/16/2017   Basal cell carcinoma 07/17/2017   Vaginismus 07/17/2017   Abnormal TSH 03/28/2017   Diarrhea 11/05/2016   Port catheter in place 10/23/2016   Genetic testing 08/15/2016   Malignant neoplasm of upper-outer quadrant of left  breast in female, estrogen receptor positive ([place] 07/10/2016   Allergic rhinitis 06/13/2016   History of basal cell carcinoma 06/13/2016   Eczema 06/13/2016   Hyperlipemia 06/13/2016   History of colon polyps 06/13/2016   Plantar fascial fibromatosis 06/13/2016   RLS (restless legs syndrome) 06/13/2016   Subclinical hypothyroidism 07/20/2013   Bunion 01/21/2013   Osteopenia 01/20/2013    Celyn PNilda Simmer PT, MPH  05/30/2021, 11:00 AM  [facility] [address]Phone: [phone]  Fax:  [phone] Name: Jamie COCUZZAMRN: [id number] of Birth: [birth year]

## 2021-06-02 ENCOUNTER — Ambulatory Visit: Payer: Medicare Other | Admitting: Physical Therapy

## 2021-06-02 ENCOUNTER — Encounter: Payer: Self-pay | Admitting: Physical Therapy

## 2021-06-02 ENCOUNTER — Other Ambulatory Visit: Payer: Self-pay

## 2021-06-02 DIAGNOSIS — M25652 Stiffness of left hip, not elsewhere classified: Secondary | ICD-10-CM

## 2021-06-02 DIAGNOSIS — M25552 Pain in left hip: Secondary | ICD-10-CM | POA: Diagnosis not present

## 2021-06-02 DIAGNOSIS — M6281 Muscle weakness (generalized): Secondary | ICD-10-CM

## 2021-06-02 DIAGNOSIS — M533 Sacrococcygeal disorders, not elsewhere classified: Secondary | ICD-10-CM

## 2021-06-02 NOTE — Therapy (Signed)
[place], [name], [address] Phone: [phone]   Fax:  [phone]  Physical Therapy Treatment  Patient Details  Name: Jamie [name] MRN: [id number] Date of Birth: 1956-03-08 Referring Provider (PT): Silverio Decamp, MD   Encounter Date: 06/02/2021   PT End of Session - 06/02/21 1643     Visit Number 12    Number of Visits 12    Date for PT Re-Evaluation 06/05/21    Authorization Type Medicare    Authorization - Visit Number 12    Progress Note Due on Visit 20    PT Start Time 1525    PT Stop Time 1612    PT Time Calculation (min) 47 min    Activity Tolerance Patient tolerated treatment well    Behavior During Therapy [facility] for tasks assessed/performed             Past Medical History:  Diagnosis Date   Allergy    Anemia    Arthritis    feet   Basal cell carcinoma    Breast cancer ([place]) 06/2016   left breast   Colon polyps    Eczema    Genetic testing 08/15/2016   Ms. Eiben underwent genetic testing for hereditary cancer syndrome through [employer]'s 43-gene Common Hereditary Cancers Panel. Ms. 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 Result report is dated 08/15/2016. Please see genetic counseling documentation from 08/17/2016 for further discussion.   History of radiation therapy 01/02/17-01/31/17   left breast was treated to 42.72 Gy in 16 fractions, left breast boost 10 Gy in 5 fractions   Personal history of chemotherapy    Personal history of radiation therapy    PONV (postoperative nausea and vomiting)     Past Surgical History:  Procedure Laterality Date   BONE SPUR Bilateral 2001 AND 1988   BREAST BIOPSY     BREAST LUMPECTOMY Left    07/2016   BREAST LUMPECTOMY WITH RADIOACTIVE SEED AND SENTINEL LYMPH NODE BIOPSY Left 07/26/2016   Procedure: BREAST LUMPECTOMY WITH RADIOACTIVE SEED AND  SENTINEL LYMPH NODE BIOPSY;  Surgeon: [name], MD;  Location: [facility];  Service: General;  Laterality: Left;   BUNIONECTOMY Bilateral 02/2012   COLONOSCOPY W/ POLYPECTOMY  08/2007   DILATION AND CURETTAGE OF UTERUS  2002   MOHS SURGERY  2010   PORT-A-CATH REMOVAL N/A 07/18/2017   Procedure: REMOVAL PORT-A-CATH;  Surgeon: [name], MD;  Location: [facility];  Service: General;  Laterality: N/A;   PORTACATH PLACEMENT Right 07/26/2016   Procedure: INSERTION PORT-A-CATH;  Surgeon: [name], MD;  Location: [facility];  Service: General;  Laterality: Right;    There were no vitals filed for this visit.   Subjective Assessment - 06/02/21 1647     Subjective Pt reports she had good relief after DN last session, however the tightness is creeping back in.  She reports she has not returned to the gym yet; has been completing HEP instead.    Currently in Pain? Yes    Pain Score 4     Pain Location Hip    Pain Orientation Left;Anterior    Pain Descriptors / Indicators Tightness;Discomfort    Aggravating Factors  Rt pivoting    Pain Relieving Factors massage, stretches.              Pt seen for aquatic therapy today.  Treatment took place  in water 3.25-4 ft in depth at the [employer] pool. Temp of water was 93.  Pt entered/exited the pool via stairs independently with single rail.  Treatment:   Without UE support on wall:  forward and backward walking, high knee marching, lunging squats, side stepping, side step with squat.  Braiding Lt/Rt.  Lateral step ups (chest deep water) with opp leg hip abdct x 10 each side.  Lt forward step ups x 10.   Pushing floatation dumbbell under water x 10 with squats.  Curtsey squats with high knee to side (holding pool noodle) x 10 each side.   Holding kickboard:  forward flutter kick (no issues).  Trial of breast stroke kick (not tolerated), side stroke kick (difficulty with Lt hip forward kick)  Holding  onto wall:   Hip abdct, ext, flexion x 5 reps each leg.    Pt requires buoyancy for support and to offload joints with strengthening exercises. Viscosity of the water is needed for resistance of strengthening; water current perturbations provides challenge to standing balance unsupported, requiring increased core activation.    PT Long Term Goals - 05/25/21 1610       PT LONG TERM GOAL #1   Title Pt will be independent with HEP    Time 6    Period Weeks    Status Achieved    Target Date 06/05/21      PT LONG TERM GOAL #2   Title Pt will report decrease in L hip pain by at least 75%    Baseline 50% on 05/25/21    Time 6    Period Weeks    Status On-going    Target Date 06/05/21      PT LONG TERM GOAL #3   Title Pt will be able to perform a flight of stairs with no pain    Time 6    Period Weeks    Status Partially Met    Target Date 06/05/21      PT LONG TERM GOAL #4   Title Pt will have improved FOTO score to at least 75    Time 6    Period Weeks    Status On-going    Target Date 06/05/21      PT LONG TERM GOAL #5   Title Pt will be able to demo at least 4+/5 bilat hip abductor strength    Time 6    Period Weeks    Status On-going    Target Date 06/05/21                   Plan - 06/02/21 1644     Clinical Impression Statement Pt reported 4/10 ant Lt hip pain upon arrival.  After completion of gait in water, pain level decreased to 0/10.  Lt curtsey squats and Lt hip abdct initially created quick burst of pain in Lt hip, but by decreasing intensity of exercise, pain level returned to 0/10.  Reviewed variety of exercises patient can complete in water on her own, including swimming strokes.  Pt is making gradual gains towards remaining goals.    Rehab Potential Good    PT Frequency 2x / week    PT Duration 6 weeks    PT Treatment/Interventions ADLs/Self Care Home Management;Cryotherapy;Electrical Stimulation;Iontophoresis 47m/ml Dexamethasone;Moist Heat;Gait  training;Stair training;Functional mobility training;Therapeutic activities;Therapeutic exercise;Balance training;Neuromuscular re-education;Manual techniques;Patient/family education;Passive range of motion;Dry needling;Taping    PT Next Visit Plan Assess pelvic alignment and address accordingly. Stretch hip. continue core/ pelvis/hip stabilization exercises.  DN.  Recert.    PT Home Exercise Plan [id number]    Consulted and Agree with Plan of Care Patient             Patient will benefit from skilled therapeutic intervention in order to improve the following deficits and impairments:  Decreased range of motion, Increased fascial restricitons, Decreased activity tolerance, Pain, Decreased mobility, Decreased strength  Visit Diagnosis: Pain in left hip  Stiffness of left hip, not elsewhere classified  Muscle weakness (generalized)  Sacrococcygeal disorders, not elsewhere classified     Problem List Patient Active Problem List   Diagnosis Date Noted   Primary osteoarthritis of left hip 03/23/2021   Family history of osteoporosis 06/12/2018   History of breast cancer 06/12/2018   Advance directive discussed with patient 04/03/2018   Pap smear of cervix declined 04/03/2018   Lumbago 08/16/2017   Basal cell carcinoma 07/17/2017   Vaginismus 07/17/2017   Abnormal TSH 03/28/2017   Diarrhea 11/05/2016   Port catheter in place 10/23/2016   Genetic testing 08/15/2016   Malignant neoplasm of upper-outer quadrant of left breast in female, estrogen receptor positive ([place]) 07/10/2016   Allergic rhinitis 06/13/2016   History of basal cell carcinoma 06/13/2016   Eczema 06/13/2016   Hyperlipemia 06/13/2016   History of colon polyps 06/13/2016   Plantar fascial fibromatosis 06/13/2016   RLS (restless legs syndrome) 06/13/2016   Subclinical hypothyroidism 07/20/2013   Bunion 01/21/2013   Osteopenia 01/20/2013   Kerin Perna, PTA 06/02/21 4:54 PM  [facility] [place], [name], [address] Phone: [phone]   Fax:  [phone]  Name: YVONNA BRUN MRN: [id number] Date of Birth: 02-10-1956

## 2021-06-05 ENCOUNTER — Encounter: Payer: Self-pay | Admitting: Rehabilitative and Restorative Service Providers"

## 2021-06-05 ENCOUNTER — Ambulatory Visit: Payer: Medicare Other | Admitting: Rehabilitative and Restorative Service Providers"

## 2021-06-05 ENCOUNTER — Other Ambulatory Visit: Payer: Self-pay

## 2021-06-05 DIAGNOSIS — M533 Sacrococcygeal disorders, not elsewhere classified: Secondary | ICD-10-CM

## 2021-06-05 DIAGNOSIS — M25552 Pain in left hip: Secondary | ICD-10-CM | POA: Diagnosis not present

## 2021-06-05 DIAGNOSIS — R2689 Other abnormalities of gait and mobility: Secondary | ICD-10-CM

## 2021-06-05 DIAGNOSIS — M6281 Muscle weakness (generalized): Secondary | ICD-10-CM

## 2021-06-05 DIAGNOSIS — M25652 Stiffness of left hip, not elsewhere classified: Secondary | ICD-10-CM

## 2021-06-05 NOTE — Patient Instructions (Signed)
Standing with posterior right hip against counter  Reach back with Lt foot to stretch through Lt anterior hip  Hold 2-30 sec 3-5 reps

## 2021-06-05 NOTE — Therapy (Signed)
[place], [name], [address] Phone: [phone]   Fax:  [phone]  Physical Therapy Treatment  Patient Details  Name: Jamie [name] MRN: [id number] Date of Birth: 13-Apr-1956 Referring Provider (PT): Silverio Decamp, MD   Encounter Date: 06/05/2021   PT End of Session - 06/05/21 1345     Visit Number 13    Number of Visits 24    Date for PT Re-Evaluation 07/17/21    Authorization - Visit Number 13    Progress Note Due on Visit 20    PT Start Time 9678    PT Stop Time 9381    PT Time Calculation (min) 49 min    Activity Tolerance Patient tolerated treatment well             Past Medical History:  Diagnosis Date   Allergy    Anemia    Arthritis    feet   Basal cell carcinoma    Breast cancer ([place]) 06/2016   left breast   Colon polyps    Eczema    Genetic testing 08/15/2016   Ms. Tith underwent genetic testing for hereditary cancer syndrome through [employer]'s 43-gene Common Hereditary Cancers Panel. Ms. 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 Result report is dated 08/15/2016. Please see genetic counseling documentation from 08/17/2016 for further discussion.   History of radiation therapy 01/02/17-01/31/17   left breast was treated to 42.72 Gy in 16 fractions, left breast boost 10 Gy in 5 fractions   Personal history of chemotherapy    Personal history of radiation therapy    PONV (postoperative nausea and vomiting)     Past Surgical History:  Procedure Laterality Date   BONE SPUR Bilateral 2001 AND 1988   BREAST BIOPSY     BREAST LUMPECTOMY Left    07/2016   BREAST LUMPECTOMY WITH RADIOACTIVE SEED AND SENTINEL LYMPH NODE BIOPSY Left 07/26/2016   Procedure: BREAST LUMPECTOMY WITH RADIOACTIVE SEED AND SENTINEL LYMPH NODE BIOPSY;  Surgeon: [name], MD;  Location: [facility];   Service: General;  Laterality: Left;   BUNIONECTOMY Bilateral 02/2012   COLONOSCOPY W/ POLYPECTOMY  08/2007   DILATION AND CURETTAGE OF UTERUS  2002   MOHS SURGERY  2010   PORT-A-CATH REMOVAL N/A 07/18/2017   Procedure: REMOVAL PORT-A-CATH;  Surgeon: [name], MD;  Location: [facility];  Service: General;  Laterality: N/A;   PORTACATH PLACEMENT Right 07/26/2016   Procedure: INSERTION PORT-A-CATH;  Surgeon: [name], MD;  Location: [facility];  Service: General;  Laterality: Right;    There were no vitals filed for this visit.   Subjective Assessment - 06/05/21 1349     Subjective Patient reports that she had great results with Dn and the Lt LE felt tired and wierd that night but much looser the following day. It has gradually tightened up through the week.    Currently in Pain? Yes    Pain Score 3     Pain Location Hip    Pain Orientation Left;Anterior    Pain Descriptors / Indicators Tightness;Discomfort    Pain Type Acute pain    Pain Radiating Towards into Lt groin and Lt gluts    Pain Onset More than a month ago    Pain Frequency Constant    Aggravating Factors  Rt pivoting    Pain Relieving Factors massage; stretching; DN  [facility] PT Assessment - 06/05/21 0001       Assessment   Medical Diagnosis M16.12 (ICD-10-CM) - Primary osteoarthritis of left hip    Referring Provider (PT) Silverio Decamp, MD    Hand Dominance Right    Prior Therapy Several years ago for R leg/back      Observation/Other Assessments   Focus on Therapeutic Outcomes (FOTO)  54      AROM   Overall AROM Comments tight Lt hip extension and rotation      Strength   Right Hip Flexion 5/5    Right Hip Extension 5/5    Right Hip External Rotation  5/5    Right Hip Internal Rotation 5/5    Left Hip Flexion 4+/5    Left Hip Extension 4+/5    Left Hip External Rotation 4+/5    Left Hip Internal Rotation 4+/5    Left Hip ABduction 4/5      Flexibility    Soft Tissue Assessment /Muscle Length --   significant tightness Lt hip flexors and adductors   Hamstrings tight      Palpation   Palpation comment muscular tightness Lt hip flexors including psoas and iliacus and adductors                           [facility] Adult PT Treatment/Exercise - 06/05/21 0001       Knee/Hip Exercises: Stretches   Hip Flexor Stretch Left;3 reps;30 seconds    Hip Flexor Stretch Limitations sitting; standing; supine thomas    Other Knee/Hip Stretches supine adductor stretch (L) with strap 2x30 sec      Knee/Hip Exercises: Aerobic   Tread Mill 1.8-2.2 mph x 5 min for warm up    Other Aerobic backwards walking x 30-40 ft x 4 trials      Moist Heat Therapy   Number Minutes Moist Heat 10 Minutes    Moist Heat Location Hip   anterior     Electrical Stimulation   Electrical Stimulation Location anterior hip; proximal adductors Lt LE    Electrical Stimulation Action TENs    Electrical Stimulation Parameters to tolerance    Electrical Stimulation Goals Pain;Tone      Manual Therapy   Manual therapy comments skilled palpation to assess response to Dn and manual work    Soft tissue mobilization deep tissue work through the Lt hip flexors/psoas/hip flexors; hip adductors              Trigger Point Dry Needling - 06/05/21 0001     Consent Given? Yes    Education Handout Provided Previously provided    Adductor Response Palpable increased muscle length;Twitch response elicited    Iliopsoas Response Palpable increased muscle length                   PT Education - 06/05/21 1356     Education Details HEP    Person(s) Educated Patient    Methods Explanation;Demonstration;Tactile cues;Verbal cues;Handout    Comprehension Verbalized understanding;Returned demonstration;Verbal cues required;Tactile cues required                 PT Long Term Goals - 06/05/21 1356       PT LONG TERM GOAL #1   Title Pt will be independent  with advanced HEP    Time 6    Period Weeks    Status New    Target Date 07/17/21      PT  LONG TERM GOAL #2   Title Pt will report decrease in L hip pain by at least 75- 100%    Baseline -    Time 6    Period Weeks    Status On-going    Target Date 07/17/21      PT LONG TERM GOAL #3   Title Pt will be able to perform a flight of stairs with no pain    Time 6    Period Weeks    Status On-going    Target Date 07/17/21      PT LONG TERM GOAL #4   Title Pt will have improved FOTO score to at least 75    Time 6    Period Weeks    Status On-going    Target Date 07/17/21      PT LONG TERM GOAL #5   Title Pt will be able to demo at least 4+/5 bilat hip abductor strength    Time 6    Period Weeks    Status On-going    Target Date 07/17/21                   Plan - 06/05/21 1351     Clinical Impression Statement Good response to DN with decreased tightness and pain lasting several days after some initial soreness. Tightness gradually increased through the week. Patient continues to have palpable tightness. She is gradually progressing toward stated goals of therapy. Will benefit from continued treatment to achieve maximum rehab potential.    Rehab Potential Good    PT Frequency 2x / week    PT Duration 6 weeks    PT Treatment/Interventions ADLs/Self Care Home Management;Cryotherapy;Electrical Stimulation;Iontophoresis 4mg /ml Dexamethasone;Moist Heat;Gait training;Stair training;Functional mobility training;Therapeutic activities;Therapeutic exercise;Balance training;Neuromuscular re-education;Manual techniques;Patient/family education;Passive range of motion;Dry needling;Taping    PT Next Visit Plan Assess pelvic alignment and address accordingly. Stretch hip. continue core/ pelvis/hip stabilization exercises; continue with DN and manual work Lt hip/LE    [place] [id number]    Consulted and Agree with Plan of Care Patient             Patient will  benefit from skilled therapeutic intervention in order to improve the following deficits and impairments:     Visit Diagnosis: Pain in left hip  Stiffness of left hip, not elsewhere classified  Muscle weakness (generalized)  Sacrococcygeal disorders, not elsewhere classified  Other abnormalities of gait and mobility     Problem List Patient Active Problem List   Diagnosis Date Noted   Primary osteoarthritis of left hip 03/23/2021   Family history of osteoporosis 06/12/2018   History of breast cancer 06/12/2018   Advance directive discussed with patient 04/03/2018   Pap smear of cervix declined 04/03/2018   Lumbago 08/16/2017   Basal cell carcinoma 07/17/2017   Vaginismus 07/17/2017   Abnormal TSH 03/28/2017   Diarrhea 11/05/2016   Port catheter in place 10/23/2016   Genetic testing 08/15/2016   Malignant neoplasm of upper-outer quadrant of left breast in female, estrogen receptor positive ([place]) 07/10/2016   Allergic rhinitis 06/13/2016   History of basal cell carcinoma 06/13/2016   Eczema 06/13/2016   Hyperlipemia 06/13/2016   History of colon polyps 06/13/2016   Plantar fascial fibromatosis 06/13/2016   RLS (restless legs syndrome) 06/13/2016   Subclinical hypothyroidism 07/20/2013   Bunion 01/21/2013   Osteopenia 01/20/2013    Darwin Rothlisberger Nilda Simmer, PT, MPH  06/05/2021, 2:34 PM  [place]  [place], [name], [address] Phone: [phone]   Fax:  [phone]  Name: AGAM TUOHY MRN: [id number] Date of Birth: Sep 19, 1955

## 2021-06-08 ENCOUNTER — Ambulatory Visit: Payer: Medicare Other | Admitting: Physical Therapy

## 2021-06-08 ENCOUNTER — Encounter: Payer: Self-pay | Admitting: Rehabilitative and Restorative Service Providers"

## 2021-06-08 ENCOUNTER — Other Ambulatory Visit: Payer: Self-pay

## 2021-06-08 ENCOUNTER — Ambulatory Visit: Payer: Medicare Other | Admitting: Rehabilitative and Restorative Service Providers"

## 2021-06-08 DIAGNOSIS — M25552 Pain in left hip: Secondary | ICD-10-CM

## 2021-06-08 DIAGNOSIS — M6281 Muscle weakness (generalized): Secondary | ICD-10-CM

## 2021-06-08 DIAGNOSIS — R2689 Other abnormalities of gait and mobility: Secondary | ICD-10-CM

## 2021-06-08 DIAGNOSIS — M25652 Stiffness of left hip, not elsewhere classified: Secondary | ICD-10-CM

## 2021-06-08 DIAGNOSIS — M533 Sacrococcygeal disorders, not elsewhere classified: Secondary | ICD-10-CM

## 2021-06-08 NOTE — Patient Instructions (Signed)
Access Code: [email or web address] URL: [email or web address] Date: 06/08/2021 Prepared by: [place] with Hip Abduction and Resistance - 1 x daily - 7 x weekly - 2 sets - 10 reps Seated Hip Flexor Stretch - 2 x daily - 7 x weekly - 1 sets - 2 reps - 15-3030 seconds hold Sidelying ITB Stretch off Table - 1 x daily - 7 x weekly - 3 sets - 2 reps - 15-30 seconds hold Sidelying Hip Abduction - 1 x daily - 7 x weekly - 2 sets - 10 reps Prone Hip Extension - 1 x daily - 7 x weekly - 2 sets - 10 reps Bird Dog - 1 x daily - 7 x weekly - 2 sets - 10 reps Hip Flexor Stretch at Edge of Bed - 2 x daily - 7 x weekly - 1 sets - 3 reps - 30 sec hold Side Stepping with Resistance at Thighs - 1 x daily - 7 x weekly

## 2021-06-08 NOTE — Therapy (Signed)
[place], [name], [address] Phone: [phone]   Fax:  [phone]  Physical Therapy Treatment  Patient Details  Name: Jamie [name] MRN: [id number] Date of Birth: October 30, 1955 Referring Provider (PT): Silverio Decamp, MD   Encounter Date: 06/08/2021   PT End of Session - 06/08/21 1538     Visit Number 14    Number of Visits 24    Date for PT Re-Evaluation 07/17/21    Authorization Type Medicare    Authorization - Visit Number 14    Progress Note Due on Visit 20    PT Start Time 8588    PT Stop Time 1615    PT Time Calculation (min) 50 min    Activity Tolerance Patient tolerated treatment well             Past Medical History:  Diagnosis Date   Allergy    Anemia    Arthritis    feet   Basal cell carcinoma    Breast cancer ([place]) 06/2016   left breast   Colon polyps    Eczema    Genetic testing 08/15/2016   Ms. Vejar underwent genetic testing for hereditary cancer syndrome through [employer]'s 43-gene Common Hereditary Cancers Panel. Ms. 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 Result report is dated 08/15/2016. Please see genetic counseling documentation from 08/17/2016 for further discussion.   History of radiation therapy 01/02/17-01/31/17   left breast was treated to 42.72 Gy in 16 fractions, left breast boost 10 Gy in 5 fractions   Personal history of chemotherapy    Personal history of radiation therapy    PONV (postoperative nausea and vomiting)     Past Surgical History:  Procedure Laterality Date   BONE SPUR Bilateral 2001 AND 1988   BREAST BIOPSY     BREAST LUMPECTOMY Left    07/2016   BREAST LUMPECTOMY WITH RADIOACTIVE SEED AND SENTINEL LYMPH NODE BIOPSY Left 07/26/2016   Procedure: BREAST LUMPECTOMY WITH RADIOACTIVE SEED AND SENTINEL LYMPH NODE BIOPSY;  Surgeon: [name], MD;  Location:  [facility];  Service: General;  Laterality: Left;   BUNIONECTOMY Bilateral 02/2012   COLONOSCOPY W/ POLYPECTOMY  08/2007   DILATION AND CURETTAGE OF UTERUS  2002   MOHS SURGERY  2010   PORT-A-CATH REMOVAL N/A 07/18/2017   Procedure: REMOVAL PORT-A-CATH;  Surgeon: [name], MD;  Location: [facility];  Service: General;  Laterality: N/A;   PORTACATH PLACEMENT Right 07/26/2016   Procedure: INSERTION PORT-A-CATH;  Surgeon: [name], MD;  Location: [facility];  Service: General;  Laterality: Right;    There were no vitals filed for this visit.   Subjective Assessment - 06/08/21 1551     Subjective Patient reports that she had great results with Dn and the Lt LE felt heavy but much looser the following day. It has gradually tightened up through the week but not as much. Doing TENs, heat, ball release    Currently in Pain? Yes    Pain Score 3     Pain Location Hip    Pain Orientation Left;Anterior    Pain Descriptors / Indicators Tightness;Discomfort    Pain Type Acute pain                               [facility] Adult PT Treatment/Exercise - 06/08/21 0001  Knee/Hip Exercises: Stretches   Hip Flexor Stretch Left;3 reps;30 seconds    Hip Flexor Stretch Limitations sitting; standing; supine thomas    Other Knee/Hip Stretches supine adductor stretch (L) with strap 2x30 sec      Knee/Hip Exercises: Aerobic   Tread Mill 1.8-2.2 mph x 5 min for warm up    Other Aerobic backwards walking x 30-40 ft x 4 trials      Knee/Hip Exercises: Standing   Walking with Sports Cord side steps with blue TB each x 20 ft each direction    Other Standing Knee Exercises hip extension stretch using strap posterior hips secured at door leaning back 20-30 sec x 2 reps      Moist Heat Therapy   Number Minutes Moist Heat 10 Minutes    Moist Heat Location Hip   anterior     Electrical Stimulation   Electrical Stimulation Location anterior hip; proximal  adductors Lt LE    Electrical Stimulation Action mAmp x 5; microcurrent x 5 min    Electrical Stimulation Parameters to tolerance    Electrical Stimulation Goals Pain;Tone      Manual Therapy   Manual therapy comments skilled palpation to assess response to Dn and manual work    Soft tissue mobilization deep tissue work through the Lt hip flexors/psoas/hip flexors; hip adductors              Trigger Point Dry Needling - 06/08/21 0001     Consent Given? Yes    Education Handout Provided Previously provided    Electrical Stimulation Performed with Dry Needling Yes    E-stim with Dry Needling Details mAmp x 5 min; microcurrent x 5 min    Adductor Response Palpable increased muscle length;Twitch response elicited    Iliopsoas Response Palpable increased muscle length                   PT Education - 06/08/21 1551     Education Details HEP    Person(s) Educated Patient    Methods Explanation;Demonstration;Tactile cues;Verbal cues;Handout    Comprehension Verbalized understanding;Returned demonstration;Verbal cues required;Tactile cues required                 PT Long Term Goals - 06/05/21 1356       PT LONG TERM GOAL #1   Title Pt will be independent with advanced HEP    Time 6    Period Weeks    Status New    Target Date 07/17/21      PT LONG TERM GOAL #2   Title Pt will report decrease in L hip pain by at least 75- 100%    Baseline -    Time 6    Period Weeks    Status On-going    Target Date 07/17/21      PT LONG TERM GOAL #3   Title Pt will be able to perform a flight of stairs with no pain    Time 6    Period Weeks    Status On-going    Target Date 07/17/21      PT LONG TERM GOAL #4   Title Pt will have improved FOTO score to at least 75    Time 6    Period Weeks    Status On-going    Target Date 07/17/21      PT LONG TERM GOAL #5   Title Pt will be able to demo at least 4+/5 bilat hip abductor strength  Time 6    Period Weeks     Status On-going    Target Date 07/17/21                   Plan - 06/08/21 1544     Clinical Impression Statement Progressing well with decreasing pain and improving mobility. Patient added hip abductor strengthening and stretch for hip extension to HEP. Continues to respond well to DN and manual work. Progressing well toward stated goals of therapy.    Rehab Potential Good    PT Frequency 2x / week    PT Duration 6 weeks    PT Treatment/Interventions ADLs/Self Care Home Management;Cryotherapy;Electrical Stimulation;Iontophoresis 4mg /ml Dexamethasone;Moist Heat;Gait training;Stair training;Functional mobility training;Therapeutic activities;Therapeutic exercise;Balance training;Neuromuscular re-education;Manual techniques;Patient/family education;Passive range of motion;Dry needling;Taping    PT Next Visit Plan Assess pelvic alignment and address accordingly. Stretch hip. continue core/ pelvis/hip stabilization exercises; continue with DN and manual work Lt hip/LE    [employer] [email or web address]    Consulted and Agree with Plan of Care Patient             Patient will benefit from skilled therapeutic intervention in order to improve the following deficits and impairments:     Visit Diagnosis: Pain in left hip  Stiffness of left hip, not elsewhere classified  Muscle weakness (generalized)  Sacrococcygeal disorders, not elsewhere classified  Other abnormalities of gait and mobility     Problem List Patient Active Problem List   Diagnosis Date Noted   Primary osteoarthritis of left hip 03/23/2021   Family history of osteoporosis 06/12/2018   History of breast cancer 06/12/2018   Advance directive discussed with patient 04/03/2018   Pap smear of cervix declined 04/03/2018   Lumbago 08/16/2017   Basal cell carcinoma 07/17/2017   Vaginismus 07/17/2017   Abnormal TSH 03/28/2017   Diarrhea 11/05/2016   Port catheter in place 10/23/2016   Genetic testing  08/15/2016   Malignant neoplasm of upper-outer quadrant of left breast in female, estrogen receptor positive ([place]) 07/10/2016   Allergic rhinitis 06/13/2016   History of basal cell carcinoma 06/13/2016   Eczema 06/13/2016   Hyperlipemia 06/13/2016   History of colon polyps 06/13/2016   Plantar fascial fibromatosis 06/13/2016   RLS (restless legs syndrome) 06/13/2016   Subclinical hypothyroidism 07/20/2013   Bunion 01/21/2013   Osteopenia 01/20/2013    Lindsi Bayliss Nilda Simmer, PT, MPH  06/08/2021, 4:13 PM  [facility] [place], [name], [address] Phone: [phone]   Fax:  [phone]  Name: Jamie [name] MRN: [id number] Date of Birth: 1955/06/22

## 2021-06-12 ENCOUNTER — Ambulatory Visit: Payer: Medicare Other | Admitting: Rehabilitative and Restorative Service Providers"

## 2021-06-12 ENCOUNTER — Other Ambulatory Visit: Payer: Self-pay

## 2021-06-12 ENCOUNTER — Encounter: Payer: Self-pay | Admitting: Rehabilitative and Restorative Service Providers"

## 2021-06-12 DIAGNOSIS — M6281 Muscle weakness (generalized): Secondary | ICD-10-CM

## 2021-06-12 DIAGNOSIS — M25552 Pain in left hip: Secondary | ICD-10-CM | POA: Diagnosis not present

## 2021-06-12 DIAGNOSIS — M25652 Stiffness of left hip, not elsewhere classified: Secondary | ICD-10-CM

## 2021-06-12 DIAGNOSIS — R2689 Other abnormalities of gait and mobility: Secondary | ICD-10-CM

## 2021-06-12 DIAGNOSIS — M533 Sacrococcygeal disorders, not elsewhere classified: Secondary | ICD-10-CM

## 2021-06-12 NOTE — Therapy (Signed)
[place], [name], [address] Phone: [phone]   Fax:  [phone]  Physical Therapy Treatment  Patient Details  Name: Jamie [name] MRN: [id number] Date of Birth: 24-Nov-1955 Referring Provider (PT): Silverio Decamp, MD   Encounter Date: 06/12/2021   PT End of Session - 06/12/21 0932     Visit Number 15    Number of Visits 24    Date for PT Re-Evaluation 07/17/21    Authorization Type Medicare    Authorization - Visit Number 15    Progress Note Due on Visit 20    PT Start Time 0926    PT Stop Time 1015    PT Time Calculation (min) 49 min    Activity Tolerance Patient tolerated treatment well             Past Medical History:  Diagnosis Date   Allergy    Anemia    Arthritis    feet   Basal cell carcinoma    Breast cancer ([place]) 06/2016   left breast   Colon polyps    Eczema    Genetic testing 08/15/2016   Ms. Sethi underwent genetic testing for hereditary cancer syndrome through [employer]'s 43-gene Common Hereditary Cancers Panel. Ms. 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 Result report is dated 08/15/2016. Please see genetic counseling documentation from 08/17/2016 for further discussion.   History of radiation therapy 01/02/17-01/31/17   left breast was treated to 42.72 Gy in 16 fractions, left breast boost 10 Gy in 5 fractions   Personal history of chemotherapy    Personal history of radiation therapy    PONV (postoperative nausea and vomiting)     Past Surgical History:  Procedure Laterality Date   BONE SPUR Bilateral 2001 AND 1988   BREAST BIOPSY     BREAST LUMPECTOMY Left    07/2016   BREAST LUMPECTOMY WITH RADIOACTIVE SEED AND SENTINEL LYMPH NODE BIOPSY Left 07/26/2016   Procedure: BREAST LUMPECTOMY WITH RADIOACTIVE SEED AND SENTINEL LYMPH NODE BIOPSY;  Surgeon: [name], MD;  Location:  [facility];  Service: General;  Laterality: Left;   BUNIONECTOMY Bilateral 02/2012   COLONOSCOPY W/ POLYPECTOMY  08/2007   DILATION AND CURETTAGE OF UTERUS  2002   MOHS SURGERY  2010   PORT-A-CATH REMOVAL N/A 07/18/2017   Procedure: REMOVAL PORT-A-CATH;  Surgeon: [name], MD;  Location: [facility];  Service: General;  Laterality: N/A;   PORTACATH PLACEMENT Right 07/26/2016   Procedure: INSERTION PORT-A-CATH;  Surgeon: [name], MD;  Location: [facility];  Service: General;  Laterality: Right;    There were no vitals filed for this visit.   Subjective Assessment - 06/12/21 0933     Subjective Patient reports continued progress. She has some increased pain when turning over in bed at night. She has been trying to stretch more frequently during the day and using the TENs unit at home.    Currently in Pain? Yes    Pain Score 3     Pain Location Hip    Pain Orientation Left;Anterior;Posterior    Pain Descriptors / Indicators Tightness;Discomfort    Pain Type Acute pain    Pain Radiating Towards into the Lt groin and gluts    Pain Onset More than a month ago    Pain Frequency Intermittent    Aggravating Factors  turning in bed    Pain  Relieving Factors massage; TENS; DN; stretching                [facility] PT Assessment - 06/12/21 0001       Assessment   Medical Diagnosis M16.12 (ICD-10-CM) - Primary osteoarthritis of left hip    Referring Provider (PT) Silverio Decamp, MD    Hand Dominance Right    Prior Therapy Several years ago for R leg/back      Palpation   Palpation comment muscular tightness Lt hip flexors including psoas and iliacus and adductors; Lt piriformis and gluts                           [facility] Adult PT Treatment/Exercise - 06/12/21 0001       Knee/Hip Exercises: Stretches   Ambulance person reps;30 seconds    Quad Stretch Limitations prone with strap    Hip Flexor Stretch Left;3 reps;30 seconds     Hip Flexor Stretch Limitations sitting; standing; supine thomas    Other Knee/Hip Stretches supine adductor stretch (L) with strap 2x30 sec      Knee/Hip Exercises: Aerobic   Tread Mill 1.8-2.2 mph x 5 min for warm up    Other Aerobic backwards walking x 30-40 ft x 4 trials      Knee/Hip Exercises: Standing   Walking with Sports Cord side steps with blue TB each x 20 ft each direction    Other Standing Knee Exercises hip extension stretch using strap posterior hips secured at door leaning back 20-30 sec x 2 reps      Knee/Hip Exercises: Sidelying   Hip ABduction Strengthening;Left;20 reps    Hip ABduction Limitations keeping hips rolled forward and Lt extended past plane of body leading with heel      Moist Heat Therapy   Number Minutes Moist Heat 10 Minutes    Moist Heat Location Hip   anterior     Electrical Stimulation   Electrical Stimulation Location posterior hip; anterior hip; proximal adductors Lt LE    Electrical Stimulation Action mAmp x 5; microcurrent x 5 min    Electrical Stimulation Parameters to tolerance    Electrical Stimulation Goals Pain;Tone      Manual Therapy   Manual therapy comments skilled palpation to assess response to Dn and manual work    Soft tissue mobilization deep tissue work through the Lt hip flexors/psoas/hip flexors; hip adductors; posterior Lt hip through the piriformis and gluts              Trigger Point Dry Needling - 06/12/21 0001     Consent Given? Yes    Education Handout Provided Previously provided    Printmaker Performed with Dry Needling Yes    E-stim with Dry Needling Details mAmpx 5 min; microcurrent x 5 min    Adductor Response Palpable increased muscle length;Twitch response elicited    Gluteus Maximus Response Palpable increased muscle length    Piriformis Response Palpable increased muscle length    Iliopsoas Response Palpable increased muscle length                        PT Long Term  Goals - 06/05/21 1356       PT LONG TERM GOAL #1   Title Pt will be independent with advanced HEP    Time 6    Period Weeks    Status New    Target Date 07/17/21  PT LONG TERM GOAL #2   Title Pt will report decrease in L hip pain by at least 75- 100%    Baseline -    Time 6    Period Weeks    Status On-going    Target Date 07/17/21      PT LONG TERM GOAL #3   Title Pt will be able to perform a flight of stairs with no pain    Time 6    Period Weeks    Status On-going    Target Date 07/17/21      PT LONG TERM GOAL #4   Title Pt will have improved FOTO score to at least 75    Time 6    Period Weeks    Status On-going    Target Date 07/17/21      PT LONG TERM GOAL #5   Title Pt will be able to demo at least 4+/5 bilat hip abductor strength    Time 6    Period Weeks    Status On-going    Target Date 07/17/21                   Plan - 06/12/21 0941     Clinical Impression Statement Continued gradual progress. Patient reports now having periods of time with no pain. Intensity of pain is decreasing when she experiences pain. Note increased tissue extensibility. Progressing well toward stated goals of therapy.    Rehab Potential Good    PT Frequency 2x / week    PT Duration 6 weeks    PT Treatment/Interventions ADLs/Self Care Home Management;Cryotherapy;Electrical Stimulation;Iontophoresis 4mg /ml Dexamethasone;Moist Heat;Gait training;Stair training;Functional mobility training;Therapeutic activities;Therapeutic exercise;Balance training;Neuromuscular re-education;Manual techniques;Patient/family education;Passive range of motion;Dry needling;Taping    PT Next Visit Plan Assess pelvic alignment and address accordingly. Stretch hip. continue core/ pelvis/hip stabilization exercises; continue with DN and manual work Lt hip/LE    [place] [email or web address]    Consulted and Agree with Plan of Care Patient             Patient will benefit from skilled  therapeutic intervention in order to improve the following deficits and impairments:     Visit Diagnosis: Pain in left hip  Stiffness of left hip, not elsewhere classified  Muscle weakness (generalized)  Sacrococcygeal disorders, not elsewhere classified  Other abnormalities of gait and mobility     Problem List Patient Active Problem List   Diagnosis Date Noted   Primary osteoarthritis of left hip 03/23/2021   Family history of osteoporosis 06/12/2018   History of breast cancer 06/12/2018   Advance directive discussed with patient 04/03/2018   Pap smear of cervix declined 04/03/2018   Lumbago 08/16/2017   Basal cell carcinoma 07/17/2017   Vaginismus 07/17/2017   Abnormal TSH 03/28/2017   Diarrhea 11/05/2016   Port catheter in place 10/23/2016   Genetic testing 08/15/2016   Malignant neoplasm of upper-outer quadrant of left breast in female, estrogen receptor positive ([place]) 07/10/2016   Allergic rhinitis 06/13/2016   History of basal cell carcinoma 06/13/2016   Eczema 06/13/2016   Hyperlipemia 06/13/2016   History of colon polyps 06/13/2016   Plantar fascial fibromatosis 06/13/2016   RLS (restless legs syndrome) 06/13/2016   Subclinical hypothyroidism 07/20/2013   Bunion 01/21/2013   Osteopenia 01/20/2013    Seymore Brodowski Nilda Simmer, PT, MPH  06/12/2021, 10:09 AM  [facility] [place], [name], [address] Phone: [phone]   Fax:  [phone]  Name: RASHIYA LOFLAND MRN: [id number] Date of Birth: 05/01/1956

## 2021-06-15 ENCOUNTER — Ambulatory Visit: Payer: Medicare Other | Admitting: Rehabilitative and Restorative Service Providers"

## 2021-06-15 ENCOUNTER — Other Ambulatory Visit: Payer: Self-pay

## 2021-06-15 ENCOUNTER — Encounter: Payer: Self-pay | Admitting: Rehabilitative and Restorative Service Providers"

## 2021-06-15 DIAGNOSIS — M533 Sacrococcygeal disorders, not elsewhere classified: Secondary | ICD-10-CM

## 2021-06-15 DIAGNOSIS — R2689 Other abnormalities of gait and mobility: Secondary | ICD-10-CM

## 2021-06-15 DIAGNOSIS — M25552 Pain in left hip: Secondary | ICD-10-CM

## 2021-06-15 DIAGNOSIS — M25652 Stiffness of left hip, not elsewhere classified: Secondary | ICD-10-CM

## 2021-06-15 DIAGNOSIS — M6281 Muscle weakness (generalized): Secondary | ICD-10-CM

## 2021-06-15 NOTE — Therapy (Signed)
[place], [name], [address] Phone: [phone]   Fax:  [phone]  Physical Therapy Treatment  Patient Details  Name: Jamie [name] MRN: [id number] Date of Birth: 1955/08/07 Referring Provider (PT): Silverio Decamp, MD   Encounter Date: 06/15/2021   PT End of Session - 06/15/21 0937     Visit Number 16    Number of Visits 24    Date for PT Re-Evaluation 07/17/21    Authorization Type Medicare    Authorization - Visit Number 24    Progress Note Due on Visit 20    PT Start Time 0928    PT Stop Time 1017    PT Time Calculation (min) 49 min    Activity Tolerance Patient tolerated treatment well             Past Medical History:  Diagnosis Date   Allergy    Anemia    Arthritis    feet   Basal cell carcinoma    Breast cancer ([facility]) 06/2016   left breast   Colon polyps    Eczema    Genetic testing 08/15/2016   Ms. Bingaman underwent genetic testing for hereditary cancer syndrome through [employer]'s 43-gene Common Hereditary Cancers Panel. Ms. 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 Result report is dated 08/15/2016. Please see genetic counseling documentation from 08/17/2016 for further discussion.   History of radiation therapy 01/02/17-01/31/17   left breast was treated to 42.72 Gy in 16 fractions, left breast boost 10 Gy in 5 fractions   Personal history of chemotherapy    Personal history of radiation therapy    PONV (postoperative nausea and vomiting)     Past Surgical History:  Procedure Laterality Date   BONE SPUR Bilateral 2001 AND 1988   BREAST BIOPSY     BREAST LUMPECTOMY Left    07/2016   BREAST LUMPECTOMY WITH RADIOACTIVE SEED AND SENTINEL LYMPH NODE BIOPSY Left 07/26/2016   Procedure: BREAST LUMPECTOMY WITH RADIOACTIVE SEED AND SENTINEL LYMPH NODE BIOPSY;  Surgeon: [name], MD;  Location:  [facility];  Service: General;  Laterality: Left;   BUNIONECTOMY Bilateral 02/2012   COLONOSCOPY W/ POLYPECTOMY  08/2007   DILATION AND CURETTAGE OF UTERUS  2002   MOHS SURGERY  2010   PORT-A-CATH REMOVAL N/A 07/18/2017   Procedure: REMOVAL PORT-A-CATH;  Surgeon: [name], MD;  Location: [facility];  Service: General;  Laterality: N/A;   PORTACATH PLACEMENT Right 07/26/2016   Procedure: INSERTION PORT-A-CATH;  Surgeon: [name], MD;  Location: [facility];  Service: General;  Laterality: Right;    There were no vitals filed for this visit.   Subjective Assessment - 06/15/21 0937     Subjective Going in the right direction. Walked 1.5 miles this week with minimal discomfort or pain. Feels the stretching. Working on the exercises at home. DN really helps     Currently in Pain? Yes    Pain Score 2     Pain Location Hip    Pain Orientation Left;Anterior;Posterior    Pain Descriptors / Indicators Tightness;Discomfort    Pain Type Acute pain    Pain Onset More than a month ago    Pain Frequency Intermittent                               [facility]  Adult PT Treatment/Exercise - 06/15/21 0001       Knee/Hip Exercises: Stretches   Ambulance person reps;30 seconds    Quad Stretch Limitations prone with strap    Hip Flexor Stretch Left;3 reps;30 seconds    Hip Flexor Stretch Limitations sitting; standing; supine thomas    Other Knee/Hip Stretches supine adductor stretch (L) with strap 2x30 sec      Knee/Hip Exercises: Aerobic   Tread Mill 1.8-2.2 mph x 5 min for warm up    Other Aerobic backwards walking x 30-40 ft x 4 trials      Knee/Hip Exercises: Standing   Wall Squat 10 reps    Wall Squat Limitations 10 sec hold    Walking with Sports Cord side steps with blue TB each x 20 ft each direction    Other Standing Knee Exercises hip extension stretch using strap posterior hips secured at door leaning back 20-30 sec x 2 reps       Knee/Hip Exercises: Sidelying   Hip ABduction Strengthening;Left;20 reps    Hip ABduction Limitations keeping hips rolled forward and Lt extended past plane of body leading with heel      Moist Heat Therapy   Number Minutes Moist Heat 10 Minutes    Moist Heat Location Hip   anterior     Electrical Stimulation   Electrical Stimulation Location posterior hip; anterior hip; proximal adductors Lt LE    Electrical Stimulation Action mAmp x 5 in; microcurrent x 5 min    Electrical Stimulation Parameters to tolerance    Electrical Stimulation Goals Pain;Tone      Manual Therapy   Manual therapy comments skilled palpation to assess response to Dn and manual work    Soft tissue mobilization deep tissue work through the Lt hip flexors/psoas/hip flexors; hip adductors; posterior Lt hip through the piriformis and gluts              Trigger Point Dry Needling - 06/15/21 0001     Consent Given? Yes    Education Handout Provided Previously provided    Printmaker Performed with Dry Needling Yes    E-stim with Dry Needling Details mAmpx 5 min; microcurrent x 5 min    Adductor Response Palpable increased muscle length;Twitch response elicited    Gluteus Maximus Response Palpable increased muscle length    Piriformis Response Palpable increased muscle length    Iliopsoas Response Palpable increased muscle length                   PT Education - 06/15/21 0943     Education Details HEP    Person(s) Educated Patient    Methods Explanation;Demonstration;Tactile cues;Verbal cues;Handout    Comprehension Verbalized understanding;Returned demonstration;Verbal cues required;Tactile cues required                 PT Long Term Goals - 06/05/21 1356       PT LONG TERM GOAL #1   Title Pt will be independent with advanced HEP    Time 6    Period Weeks    Status New    Target Date 07/17/21      PT LONG TERM GOAL #2   Title Pt will report decrease in L hip pain by at  least 75- 100%    Baseline -    Time 6    Period Weeks    Status On-going    Target Date 07/17/21      PT LONG TERM GOAL #3  Title Pt will be able to perform a flight of stairs with no pain    Time 6    Period Weeks    Status On-going    Target Date 07/17/21      PT LONG TERM GOAL #4   Title Pt will have improved FOTO score to at least 75    Time 6    Period Weeks    Status On-going    Target Date 07/17/21      PT LONG TERM GOAL #5   Title Pt will be able to demo at least 4+/5 bilat hip abductor strength    Time 6    Period Weeks    Status On-going    Target Date 07/17/21                   Plan - 06/15/21 0939     Clinical Impression Statement Progressing with increasing functional activity level. Added strengthening and continued with positive response to manual work and DN    Rehab Potential Good    PT Frequency 2x / week    PT Duration 6 weeks    PT Treatment/Interventions ADLs/Self Care Home Management;Cryotherapy;Electrical Stimulation;Iontophoresis 4mg /ml Dexamethasone;Moist Heat;Gait training;Stair training;Functional mobility training;Therapeutic activities;Therapeutic exercise;Balance training;Neuromuscular re-education;Manual techniques;Patient/family education;Passive range of motion;Dry needling;Taping    PT Next Visit Plan Assess pelvic alignment and address accordingly. Stretch hip. continue core/ pelvis/hip stabilization exercises; continue with DN and manual work Lt hip/LE    [place] [email or web address]    Consulted and Agree with Plan of Care Patient             Patient will benefit from skilled therapeutic intervention in order to improve the following deficits and impairments:     Visit Diagnosis: Pain in left hip  Stiffness of left hip, not elsewhere classified  Muscle weakness (generalized)  Sacrococcygeal disorders, not elsewhere classified  Other abnormalities of gait and mobility     Problem List Patient Active  Problem List   Diagnosis Date Noted   Primary osteoarthritis of left hip 03/23/2021   Family history of osteoporosis 06/12/2018   History of breast cancer 06/12/2018   Advance directive discussed with patient 04/03/2018   Pap smear of cervix declined 04/03/2018   Lumbago 08/16/2017   Basal cell carcinoma 07/17/2017   Vaginismus 07/17/2017   Abnormal TSH 03/28/2017   Diarrhea 11/05/2016   Port catheter in place 10/23/2016   Genetic testing 08/15/2016   Malignant neoplasm of upper-outer quadrant of left breast in female, estrogen receptor positive ([place]) 07/10/2016   Allergic rhinitis 06/13/2016   History of basal cell carcinoma 06/13/2016   Eczema 06/13/2016   Hyperlipemia 06/13/2016   History of colon polyps 06/13/2016   Plantar fascial fibromatosis 06/13/2016   RLS (restless legs syndrome) 06/13/2016   Subclinical hypothyroidism 07/20/2013   Bunion 01/21/2013   Osteopenia 01/20/2013    Waverley Krempasky Nilda Simmer, PT, MPH  06/15/2021, 10:08 AM  [facility] [place], [name], [address] Phone: [phone]   Fax:  [phone]  Name: Jamie [name] MRN: [id number] Date of Birth: 27-Jul-1955

## 2021-06-15 NOTE — Patient Instructions (Signed)
Access Code: [email or web address] URL: [email or web address] Date: 06/15/2021 Prepared by: Gillermo Murdoch  Exercises Bridge with Hip Abduction and Resistance - 1 x daily - 7 x weekly - 2 sets - 10 reps Seated Hip Flexor Stretch - 2 x daily - 7 x weekly - 1 sets - 2 reps - 15-3030 seconds hold Sidelying ITB Stretch off Table - 1 x daily - 7 x weekly - 3 sets - 2 reps - 15-30 seconds hold Sidelying Hip Abduction - 1 x daily - 7 x weekly - 2 sets - 10 reps Prone Hip Extension - 1 x daily - 7 x weekly - 2 sets - 10 reps Bird Dog - 1 x daily - 7 x weekly - 2 sets - 10 reps Hip Flexor Stretch at Edge of Bed - 2 x daily - 7 x weekly - 1 sets - 3 reps - 30 sec hold Side Stepping with Resistance at Thighs - 1 x daily - 7 x weekly Wall Quarter Squat - 2 x daily - 7 x weekly - 1-2 sets - 10 reps - 5-10 sec hold

## 2021-06-19 ENCOUNTER — Ambulatory Visit: Payer: Medicare Other | Admitting: Rehabilitative and Restorative Service Providers"

## 2021-06-19 ENCOUNTER — Encounter: Payer: Self-pay | Admitting: Rehabilitative and Restorative Service Providers"

## 2021-06-19 ENCOUNTER — Other Ambulatory Visit: Payer: Self-pay

## 2021-06-19 DIAGNOSIS — M6281 Muscle weakness (generalized): Secondary | ICD-10-CM

## 2021-06-19 DIAGNOSIS — M25652 Stiffness of left hip, not elsewhere classified: Secondary | ICD-10-CM

## 2021-06-19 DIAGNOSIS — M25552 Pain in left hip: Secondary | ICD-10-CM

## 2021-06-19 DIAGNOSIS — R2689 Other abnormalities of gait and mobility: Secondary | ICD-10-CM

## 2021-06-19 DIAGNOSIS — M533 Sacrococcygeal disorders, not elsewhere classified: Secondary | ICD-10-CM

## 2021-06-19 NOTE — Therapy (Signed)
Jamie [name], [name], [address] Phone: [phone]   Fax:  [phone]  Physical Therapy Treatment  Patient Details  Name: Jamie [name] MRN: [id number] Date of Birth: 16-Apr-1956 Referring Provider (PT): Silverio Decamp, MD   Encounter Date: 06/19/2021   PT End of Session - 06/19/21 1020     Visit Number 17    Number of Visits 24    Date for PT Re-Evaluation 07/17/21    Authorization Type Medicare    Authorization - Visit Number 24    Progress Note Due on Visit 20    PT Start Time 1012    PT Stop Time 1100    PT Time Calculation (min) 48 min             Past Medical History:  Diagnosis Date   Allergy    Anemia    Arthritis    feet   Basal cell carcinoma    Breast cancer ([place]) 06/2016   left breast   Colon polyps    Eczema    Genetic testing 08/15/2016   [name] underwent genetic testing for hereditary cancer syndrome through [employer]'s 43-gene Common Hereditary Cancers Panel. 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 Result report is dated 08/15/2016. Please see genetic counseling documentation from 08/17/2016 for further discussion.   History of radiation therapy 01/02/17-01/31/17   left breast was treated to 42.72 Gy in 16 fractions, left breast boost 10 Gy in 5 fractions   Personal history of chemotherapy    Personal history of radiation therapy    PONV (postoperative nausea and vomiting)     Past Surgical History:  Procedure Laterality Date   BONE SPUR Bilateral 2001 AND 1988   BREAST BIOPSY     BREAST LUMPECTOMY Left    07/2016   BREAST LUMPECTOMY WITH RADIOACTIVE SEED AND SENTINEL LYMPH NODE BIOPSY Left 07/26/2016   Procedure: BREAST LUMPECTOMY WITH RADIOACTIVE SEED AND SENTINEL LYMPH NODE BIOPSY;  Surgeon: [name], MD;  Location: [facility];  Service: General;   Laterality: Left;   BUNIONECTOMY Bilateral 02/2012   COLONOSCOPY W/ POLYPECTOMY  08/2007   DILATION AND CURETTAGE OF UTERUS  2002   MOHS SURGERY  2010   PORT-A-CATH REMOVAL N/A 07/18/2017   Procedure: REMOVAL PORT-A-CATH;  Surgeon: [name], MD;  Location: [facility];  Service: General;  Laterality: N/A;   PORTACATH PLACEMENT Right 07/26/2016   Procedure: INSERTION PORT-A-CATH;  Surgeon: [name], MD;  Location: [facility];  Service: General;  Laterality: Right;    There were no vitals filed for this visit.   Subjective Assessment - 06/19/21 1023     Subjective Continued gradual improvement. Some pain with stretching. Normally just aching. Functional activities are improving.    Currently in Pain? Yes    Pain Score 1     Pain Location Hip    Pain Orientation Left;Anterior;Posterior    Pain Descriptors / Indicators Tightness;Aching;Discomfort                [facility] PT Assessment - 06/19/21 0001       Assessment   Medical Diagnosis M16.12 (ICD-10-CM) - Primary osteoarthritis of left hip    Referring Provider (PT) Silverio Decamp, MD    Hand Dominance Right    Prior Therapy Several years ago for R leg/back      Ambulation/Gait   Ambulation Distance (  Feet) --   1/2 mile on treadmill   Gait velocity 3.5 mph    Gait Comments trunk and hips fwd flexed; limp Lt LE in wt bearing Lt improved with core activation and improved upright posture                           [facility] Adult PT Treatment/Exercise - 06/19/21 0001       Knee/Hip Exercises: Stretches   Hip Flexor Stretch Left;3 reps;30 seconds    Hip Flexor Stretch Limitations sitting; standing; supine thomas    Piriformis Stretch Left;2 reps;30 seconds    Piriformis Stretch Limitations supine travell    Other Knee/Hip Stretches supine adductor stretch (L) with strap 2x30 sec      Knee/Hip Exercises: Aerobic   Tread Mill 1.8-2.2 mph x 5 min for warm up      Knee/Hip Exercises:  Standing   Hip Abduction Stengthening;Left;10 reps;Knee straight    Abduction Limitations Rt hip on coregeous ball knee bent; Lt hip pressing into wall    Other Standing Knee Exercises antirotation green x 10 each side      Moist Heat Therapy   Number Minutes Moist Heat 10 Minutes    Moist Heat Location Hip   anterior     Electrical Stimulation   Electrical Stimulation Location posterior hip; anterior hip; proximal adductors Lt LE    Electrical Stimulation Action mAmpx 5 min; microcurrent x 5 min    Electrical Stimulation Parameters to tolerance    Electrical Stimulation Goals Pain;Tone      Manual Therapy   Manual therapy comments skilled palpation to assess response to Dn and manual work    Joint Mobilization PA mobs Lt greater trochanter    Soft tissue mobilization deep tissue work through the Lt hip flexors/psoas/hip flexors; hip adductors; posterior Lt hip through the piriformis and gluts    Passive ROM IR/ER Lt hip knee in flexion/hip extended              Trigger Point Dry Needling - 06/19/21 0001     Consent Given? Yes    Education Handout Provided Previously provided    Printmaker Performed with Dry Needling Yes    E-stim with Dry Needling Details mAmpx 5 min; microcurrent x 5 min    Adductor Response Palpable increased muscle length;Twitch response elicited    Gluteus Maximus Response Palpable increased muscle length    Piriformis Response Palpable increased muscle length    Iliopsoas Response Palpable increased muscle length                   PT Education - 06/19/21 1031     Education Details HEP    Person(s) Educated Patient    Methods Explanation;Demonstration;Tactile cues;Verbal cues;Handout    Comprehension Verbalized understanding;Returned demonstration;Verbal cues required;Tactile cues required                 PT Long Term Goals - 06/05/21 1356       PT LONG TERM GOAL #1   Title Pt will be independent with advanced HEP     Time 6    Period Weeks    Status New    Target Date 07/17/21      PT LONG TERM GOAL #2   Title Pt will report decrease in L hip pain by at least 75- 100%    Baseline -    Time 6    Period Weeks  Status On-going    Target Date 07/17/21      PT LONG TERM GOAL #3   Title Pt will be able to perform a flight of stairs with no pain    Time 6    Period Weeks    Status On-going    Target Date 07/17/21      PT LONG TERM GOAL #4   Title Pt will have improved FOTO score to at least 75    Time 6    Period Weeks    Status On-going    Target Date 07/17/21      PT LONG TERM GOAL #5   Title Pt will be able to demo at least 4+/5 bilat hip abductor strength    Time 6    Period Weeks    Status On-going    Target Date 07/17/21                   Plan - 06/19/21 1024     Clinical Impression Statement Gait pattern shows forward hip flexion and limp Lt LE - corrected with improved posture and core engaged. Continued tightness Lt gluts and piriformis. Adding Lt hip strengthening and core stabilization. Continues to respond well to DN and manual work.    Rehab Potential Good    PT Frequency 2x / week    PT Duration 6 weeks    PT Treatment/Interventions ADLs/Self Care Home Management;Cryotherapy;Electrical Stimulation;Iontophoresis 4mg /ml Dexamethasone;Moist Heat;Gait training;Stair training;Functional mobility training;Therapeutic activities;Therapeutic exercise;Balance training;Neuromuscular re-education;Manual techniques;Patient/family education;Passive range of motion;Dry needling;Taping    PT Next Visit Plan Assess pelvic alignment and address accordingly. Stretch hip. continue core/ pelvis/hip stabilization exercises; continue with DN and manual work Lt hip/LE    [place] [email or web address]    Consulted and Agree with Plan of Care Patient             Patient will benefit from skilled therapeutic intervention in order to improve the following deficits and impairments:      Visit Diagnosis: Pain in left hip  Muscle weakness (generalized)  Stiffness of left hip, not elsewhere classified  Sacrococcygeal disorders, not elsewhere classified  Other abnormalities of gait and mobility     Problem List Patient Active Problem List   Diagnosis Date Noted   Primary osteoarthritis of left hip 03/23/2021   Family history of osteoporosis 06/12/2018   History of breast cancer 06/12/2018   Advance directive discussed with patient 04/03/2018   Pap smear of cervix declined 04/03/2018   Lumbago 08/16/2017   Basal cell carcinoma 07/17/2017   Vaginismus 07/17/2017   Abnormal TSH 03/28/2017   Diarrhea 11/05/2016   Port catheter in place 10/23/2016   Genetic testing 08/15/2016   Malignant neoplasm of upper-outer quadrant of left breast in female, estrogen receptor positive ([place]) 07/10/2016   Allergic rhinitis 06/13/2016   History of basal cell carcinoma 06/13/2016   Eczema 06/13/2016   Hyperlipemia 06/13/2016   History of colon polyps 06/13/2016   Plantar fascial fibromatosis 06/13/2016   RLS (restless legs syndrome) 06/13/2016   Subclinical hypothyroidism 07/20/2013   Bunion 01/21/2013   Osteopenia 01/20/2013    Latriece Anstine Nilda Simmer, PT, MPH  06/19/2021, 11:02 AM  [facility] [place], [name], [address] Phone: [phone]   Fax:  [phone]  Name: Jamie [name] MRN: [id number] Date of Birth: 11/19/1955

## 2021-06-19 NOTE — Patient Instructions (Addendum)
Access Code: [email or web address] URL: [email or web address] Date: 06/19/2021 Prepared by: Gillermo Murdoch  Exercises Bridge with Hip Abduction and Resistance - 1 x daily - 7 x weekly - 2 sets - 10 reps Seated Hip Flexor Stretch - 2 x daily - 7 x weekly - 1 sets - 2 reps - 15-3030 seconds hold Sidelying ITB Stretch off Table - 1 x daily - 7 x weekly - 3 sets - 2 reps - 15-30 seconds hold Sidelying Hip Abduction - 1 x daily - 7 x weekly - 2 sets - 10 reps Prone Hip Extension - 1 x daily - 7 x weekly - 2 sets - 10 reps Bird Dog - 1 x daily - 7 x weekly - 2 sets - 10 reps Hip Flexor Stretch at Edge of Bed - 2 x daily - 7 x weekly - 1 sets - 3 reps - 30 sec hold Side Stepping with Resistance at Thighs - 1 x daily - 7 x weekly Wall Quarter Squat - 2 x daily - 7 x weekly - 1-2 sets - 10 reps - 5-10 sec hold Anti-Rotation Lateral Stepping with Press - 2 x daily - 7 x weekly - 1-2 sets - 10 reps - 2-3 sec hold Supine Piriformis Stretch with Leg Straight - 2 x daily - 7 x weekly - 1 sets - 3 reps - 30 sec hold

## 2021-06-22 ENCOUNTER — Other Ambulatory Visit: Payer: Self-pay

## 2021-06-22 ENCOUNTER — Encounter: Payer: Self-pay | Admitting: Rehabilitative and Restorative Service Providers"

## 2021-06-22 ENCOUNTER — Ambulatory Visit: Payer: Medicare Other | Attending: Sports Medicine | Admitting: Rehabilitative and Restorative Service Providers"

## 2021-06-22 DIAGNOSIS — M6281 Muscle weakness (generalized): Secondary | ICD-10-CM | POA: Diagnosis present

## 2021-06-22 DIAGNOSIS — M25652 Stiffness of left hip, not elsewhere classified: Secondary | ICD-10-CM | POA: Insufficient documentation

## 2021-06-22 DIAGNOSIS — M25552 Pain in left hip: Secondary | ICD-10-CM | POA: Insufficient documentation

## 2021-06-22 DIAGNOSIS — R2689 Other abnormalities of gait and mobility: Secondary | ICD-10-CM | POA: Insufficient documentation

## 2021-06-22 DIAGNOSIS — M533 Sacrococcygeal disorders, not elsewhere classified: Secondary | ICD-10-CM | POA: Diagnosis present

## 2021-06-22 NOTE — Therapy (Signed)
[place], [name], [address] Phone: [phone]   Fax:  [phone]  Physical Therapy Treatment  Patient Details  Name: Jamie [name] MRN: [id number] Date of Birth: [birth year] Referring Provider (PT): Silverio Decamp, MD   Encounter Date: 06/22/2021   PT End of Session - 06/22/21 1018     Visit Number 18    Number of Visits 24    Date for PT Re-Evaluation 07/17/21    Authorization Type Medicare    Authorization - Visit Number 24    Progress Note Due on Visit 20    PT Start Time 1012    PT Stop Time 1102    PT Time Calculation (min) 50 min    Activity Tolerance Patient tolerated treatment well             Past Medical History:  Diagnosis Date   Allergy    Anemia    Arthritis    feet   Basal cell carcinoma    Breast cancer ([place]) 06/2016   left breast   Colon polyps    Eczema    Genetic testing 08/15/2016   Ms. Kukla underwent genetic testing for hereditary cancer syndrome through [employer]'s 43-gene Common Hereditary Cancers Panel. Ms. 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 Result report is dated 08/15/2016. Please see genetic counseling documentation from 08/17/2016 for further discussion.   History of radiation therapy 01/02/17-01/31/17   left breast was treated to 42.72 Gy in 16 fractions, left breast boost 10 Gy in 5 fractions   Personal history of chemotherapy    Personal history of radiation therapy    PONV (postoperative nausea and vomiting)     Past Surgical History:  Procedure Laterality Date   BONE SPUR Bilateral 2001 AND 1988   BREAST BIOPSY     BREAST LUMPECTOMY Left    07/2016   BREAST LUMPECTOMY WITH RADIOACTIVE SEED AND SENTINEL LYMPH NODE BIOPSY Left 07/26/2016   Procedure: BREAST LUMPECTOMY WITH RADIOACTIVE SEED AND SENTINEL LYMPH NODE BIOPSY;  Surgeon: [name], MD;  Location:  [facility];  Service: General;  Laterality: Left;   BUNIONECTOMY Bilateral 02/2012   COLONOSCOPY W/ POLYPECTOMY  08/2007   DILATION AND CURETTAGE OF UTERUS  2002   MOHS SURGERY  2010   PORT-A-CATH REMOVAL N/A 07/18/2017   Procedure: REMOVAL PORT-A-CATH;  Surgeon: [name], MD;  Location: [facility];  Service: General;  Laterality: N/A;   PORTACATH PLACEMENT Right 07/26/2016   Procedure: INSERTION PORT-A-CATH;  Surgeon: [name], MD;  Location: [facility] [place];  Service: General;  Laterality: Right;    There were no vitals filed for this visit.   Subjective Assessment - 06/22/21 1019     Subjective Continued improvement. Less pain and tightness. Feels she has reached a new level. She is feeling better. 80% improved overall. Leg feels normal at times. Ascended stairs with greater ease and no pulling.    Currently in Pain? No/denies    Pain Score 0-No pain    Pain Location Hip                               [facility] Adult PT Treatment/Exercise - 06/22/21 0001       Ambulation/Gait   Ambulation Distance (Feet) --   1/2 mile on treadmill   Gait velocity 3.5 mph  Knee/Hip Exercises: Stretches   Hip Flexor Stretch Left;3 reps;30 seconds    Hip Flexor Stretch Limitations sitting; standing; supine thomas    Piriformis Stretch Left;2 reps;30 seconds    Piriformis Stretch Limitations supine travell - applying pressure through the anterior hip to inhibit hip flexors    Other Knee/Hip Stretches supine adductor stretch (L) with strap 2x30 sec      Knee/Hip Exercises: Aerobic   Tread Mill 1.8-2.2 mph x 5 min for warm up      Knee/Hip Exercises: Standing   Other Standing Knee Exercises antirotation green x 10 each side      Moist Heat Therapy   Number Minutes Moist Heat 10 Minutes    Moist Heat Location Hip   anterior     Electrical Stimulation   Electrical Stimulation Location posterior hip; anterior hip; proximal adductors Lt LE     Electrical Stimulation Action mAmp x 5 min piriformis; 5 min hip flexors/adductors    Electrical Stimulation Parameters to tolerance    Electrical Stimulation Goals Pain;Tone      Manual Therapy   Manual therapy comments skilled palpation to assess response to Dn and manual work    Joint Mobilization PA mobs Lt greater trochanter    Soft tissue mobilization deep tissue work through the Lt hip flexors/psoas/hip flexors; hip adductors; posterior Lt hip through the piriformis and gluts    Passive ROM IR/ER Lt hip knee in flexion/hip extended              Trigger Point Dry Needling - 06/22/21 0001     Consent Given? Yes    Education Handout Provided Previously provided    Printmaker Performed with Dry Needling Yes    E-stim with Dry Needling Details mAmpx 5 min; microcurrent x 5 min    Adductor Response Palpable increased muscle length;Twitch response elicited    Gluteus Maximus Response Palpable increased muscle length    Piriformis Response Palpable increased muscle length    Iliopsoas Response Palpable increased muscle length                        PT Long Term Goals - 06/05/21 1356       PT LONG TERM GOAL #1   Title Pt will be independent with advanced HEP    Time 6    Period Weeks    Status New    Target Date 07/17/21      PT LONG TERM GOAL #2   Title Pt will report decrease in L hip pain by at least 75- 100%    Baseline -    Time 6    Period Weeks    Status On-going    Target Date 07/17/21      PT LONG TERM GOAL #3   Title Pt will be able to perform a flight of stairs with no pain    Time 6    Period Weeks    Status On-going    Target Date 07/17/21      PT LONG TERM GOAL #4   Title Pt will have improved FOTO score to at least 75    Time 6    Period Weeks    Status On-going    Target Date 07/17/21      PT LONG TERM GOAL #5   Title Pt will be able to demo at least 4+/5 bilat hip abductor strength    Time 6    Period Weeks  Status On-going    Target Date 07/17/21                   Plan - 06/22/21 1021     Clinical Impression Statement Very positive response to last treatment focus on piriformis and posterior hip adding the stretch for piriformis. Patient reports decreased symptoms in the Lt hip and LE. Progressing well toward stated goals of therapy.    Rehab Potential Good    PT Frequency 2x / week    PT Duration 6 weeks    PT Treatment/Interventions ADLs/Self Care Home Management;Cryotherapy;Electrical Stimulation;Iontophoresis 4mg /ml Dexamethasone;Moist Heat;Gait training;Stair training;Functional mobility training;Therapeutic activities;Therapeutic exercise;Balance training;Neuromuscular re-education;Manual techniques;Patient/family education;Passive range of motion;Dry needling;Taping    PT Next Visit Plan Continue with stretching, strengthening, stabilization Lt hip and LE including continue core/ pelvis/hip stabilization exercises; continue with DN and manual work Lt hip/LE    [place] [id number]    Consulted and Agree with Plan of Care Patient             Patient will benefit from skilled therapeutic intervention in order to improve the following deficits and impairments:     Visit Diagnosis: Pain in left hip  Muscle weakness (generalized)  Stiffness of left hip, not elsewhere classified  Sacrococcygeal disorders, not elsewhere classified  Other abnormalities of gait and mobility     Problem List Patient Active Problem List   Diagnosis Date Noted   Primary osteoarthritis of left hip 03/23/2021   Family history of osteoporosis 06/12/2018   History of breast cancer 06/12/2018   Advance directive discussed with patient 04/03/2018   Pap smear of cervix declined 04/03/2018   Lumbago 08/16/2017   Basal cell carcinoma 07/17/2017   Vaginismus 07/17/2017   Abnormal TSH 03/28/2017   Diarrhea 11/05/2016   Port catheter in place 10/23/2016   Genetic testing 08/15/2016    Malignant neoplasm of upper-outer quadrant of left breast in female, estrogen receptor positive ([place]) 07/10/2016   Allergic rhinitis 06/13/2016   History of basal cell carcinoma 06/13/2016   Eczema 06/13/2016   Hyperlipemia 06/13/2016   History of colon polyps 06/13/2016   Plantar fascial fibromatosis 06/13/2016   RLS (restless legs syndrome) 06/13/2016   Subclinical hypothyroidism 07/20/2013   Bunion 01/21/2013   Osteopenia 01/20/2013    Tasneem Cormier Nilda Simmer, PT, MPH  06/22/2021, 10:54 AM  [facility] [place], [name], [address] Phone: [phone]   Fax:  [phone]  Name: Jamie [name] MRN: [id number] Date of Birth: 14-Jul-1955

## 2021-06-26 ENCOUNTER — Other Ambulatory Visit: Payer: Self-pay

## 2021-06-26 ENCOUNTER — Ambulatory Visit: Payer: Medicare Other | Admitting: Rehabilitative and Restorative Service Providers"

## 2021-06-26 ENCOUNTER — Encounter: Payer: Self-pay | Admitting: Rehabilitative and Restorative Service Providers"

## 2021-06-26 DIAGNOSIS — R2689 Other abnormalities of gait and mobility: Secondary | ICD-10-CM

## 2021-06-26 DIAGNOSIS — M25552 Pain in left hip: Secondary | ICD-10-CM | POA: Diagnosis not present

## 2021-06-26 DIAGNOSIS — M533 Sacrococcygeal disorders, not elsewhere classified: Secondary | ICD-10-CM

## 2021-06-26 DIAGNOSIS — M25652 Stiffness of left hip, not elsewhere classified: Secondary | ICD-10-CM

## 2021-06-26 DIAGNOSIS — M6281 Muscle weakness (generalized): Secondary | ICD-10-CM

## 2021-06-26 NOTE — Therapy (Signed)
[facility], [name], [address] Phone: [phone]   Fax:  [phone]  Physical Therapy Treatment  Patient Details  Name: Jamie [name] MRN: [id number] Date of Birth: February 05, 1956 Referring Provider (PT): Silverio Decamp, MD   Encounter Date: 06/26/2021   PT End of Session - 06/26/21 1022     Visit Number 19    Number of Visits 24    Date for PT Re-Evaluation 07/17/21    Authorization Type Medicare    Authorization - Visit Number 24    Progress Note Due on Visit 20    PT Start Time 1012    PT Stop Time 1100    PT Time Calculation (min) 48 min    Activity Tolerance Patient tolerated treatment well             Past Medical History:  Diagnosis Date   Allergy    Anemia    Arthritis    feet   Basal cell carcinoma    Breast cancer ([place]) 06/2016   left breast   Colon polyps    Eczema    Genetic testing 08/15/2016   Ms. Ellerson underwent genetic testing for hereditary cancer syndrome through [employer]'s 43-gene Common Hereditary Cancers Panel. Ms. 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 Result report is dated 08/15/2016. Please see genetic counseling documentation from 08/17/2016 for further discussion.   History of radiation therapy 01/02/17-01/31/17   left breast was treated to 42.72 Gy in 16 fractions, left breast boost 10 Gy in 5 fractions   Personal history of chemotherapy    Personal history of radiation therapy    PONV (postoperative nausea and vomiting)     Past Surgical History:  Procedure Laterality Date   BONE SPUR Bilateral 2001 AND 1988   BREAST BIOPSY     BREAST LUMPECTOMY Left    07/2016   BREAST LUMPECTOMY WITH RADIOACTIVE SEED AND SENTINEL LYMPH NODE BIOPSY Left 07/26/2016   Procedure: BREAST LUMPECTOMY WITH RADIOACTIVE SEED AND SENTINEL LYMPH NODE BIOPSY;  Surgeon: [name], MD;  Location:  [facility];  Service: General;  Laterality: Left;   BUNIONECTOMY Bilateral 02/2012   COLONOSCOPY W/ POLYPECTOMY  08/2007   DILATION AND CURETTAGE OF UTERUS  2002   MOHS SURGERY  2010   PORT-A-CATH REMOVAL N/A 07/18/2017   Procedure: REMOVAL PORT-A-CATH;  Surgeon: [name], MD;  Location: [facility];  Service: General;  Laterality: N/A;   PORTACATH PLACEMENT Right 07/26/2016   Procedure: INSERTION PORT-A-CATH;  Surgeon: [name], MD;  Location: [facility];  Service: General;  Laterality: Right;    There were no vitals filed for this visit.   Subjective Assessment - 06/26/21 1028     Subjective Continues to improve with less pain and tightness. She has returned to functional activity level. She is doing some walking with minimal problems as long as she is mindful of posture and tightening core. She plans to return to gym for a senior fit class tomorrow.    Currently in Pain? No/denies    Pain Score 0-No pain    Pain Location Hip    Pain Orientation Left;Anterior;Posterior    Pain Descriptors / Indicators Tightness;Aching    Pain Type Acute pain    Pain Radiating Towards Lt inner thigh    Pain Onset More than a month ago    Pain Frequency Intermittent    Aggravating  Factors  certain movements    Pain Relieving Factors massage; TENS; DN; stretching                [facility] PT Assessment - 06/26/21 0001       Assessment   Medical Diagnosis M16.12 (ICD-10-CM) - Primary osteoarthritis of left hip    Referring Provider (PT) Silverio Decamp, MD    Onset Date/Surgical Date --   spring/summer 2022   Hand Dominance Right    Prior Therapy Several years ago for R leg/back      Palpation   Palpation comment decreasing muscular tightness Lt hip flexors including psoas and iliacus and adductors; Lt piriformis and gluts      Ambulation/Gait   Ambulation Distance (Feet) --   1/2 mile on treadmill   Gait velocity 3.5 mph    Gait Comments improving gait  pattern with more upright posture and no significant limp Lt LE                           [facility] Adult PT Treatment/Exercise - 06/26/21 0001       Knee/Hip Exercises: Stretches   Hip Flexor Stretch Left;3 reps;30 seconds    Hip Flexor Stretch Limitations sitting; standing; supine thomas    Piriformis Stretch Left;2 reps;30 seconds    Piriformis Stretch Limitations supine travell - applying pressure through the anterior hip to inhibit hip flexors    Other Knee/Hip Stretches supine adductor stretch (L) with strap 2x30 sec      Knee/Hip Exercises: Aerobic   Tread Mill 1.8-2.2 mph x 5 min for warm up      Moist Heat Therapy   Number Minutes Moist Heat 10 Minutes    Moist Heat Location Hip   anterior     Electrical Stimulation   Electrical Stimulation Location posterior hip; anterior hip; proximal adductors Lt LE    Electrical Stimulation Action mAmp x 5 min posterior hip repeated x 5 min hip adductors    Electrical Stimulation Parameters to tolerance    Electrical Stimulation Goals Pain;Tone      Manual Therapy   Manual therapy comments skilled palpation to assess response to Dn and manual work    Joint Mobilization PA mobs Lt greater trochanter    Soft tissue mobilization deep tissue work through the Lt hip flexors/psoas/hip flexors; hip adductors; posterior Lt hip through the piriformis and gluts    Passive ROM IR/ER Lt hip knee in flexion/hip extended              Trigger Point Dry Needling - 06/26/21 0001     Consent Given? Yes    Education Handout Provided Previously provided    Printmaker Performed with Dry Needling Yes    E-stim with Dry Needling Details mAmpx 5 min; microcurrent x 5 min    Adductor Response Palpable increased muscle length;Twitch response elicited    Gluteus Minimus Response Palpable increased muscle length;Twitch response elicited    Gluteus Medius Response Palpable increased muscle length;Twitch response elicited     Gluteus Maximus Response Palpable increased muscle length    Piriformis Response Palpable increased muscle length    Iliopsoas Response Palpable increased muscle length                        PT Long Term Goals - 06/05/21 1356       PT LONG TERM GOAL #1   Title Pt will be  independent with advanced HEP    Time 6    Period Weeks    Status New    Target Date 07/17/21      PT LONG TERM GOAL #2   Title Pt will report decrease in L hip pain by at least 75- 100%    Baseline -    Time 6    Period Weeks    Status On-going    Target Date 07/17/21      PT LONG TERM GOAL #3   Title Pt will be able to perform a flight of stairs with no pain    Time 6    Period Weeks    Status On-going    Target Date 07/17/21      PT LONG TERM GOAL #4   Title Pt will have improved FOTO score to at least 75    Time 6    Period Weeks    Status On-going    Target Date 07/17/21      PT LONG TERM GOAL #5   Title Pt will be able to demo at least 4+/5 bilat hip abductor strength    Time 6    Period Weeks    Status On-going    Target Date 07/17/21                   Plan - 06/26/21 1047     Clinical Impression Statement Continued progress with minimal to no pain. Patient has continued tightness in the hip adducotrs, hip flexors and piriformis/gluts. She is working on [employer] and has returned to most normal functional activities including walking ~ 45 min with no significant flare up of symptoms. Excellent progress.    Rehab Potential Good    PT Frequency 2x / week    PT Duration 6 weeks    PT Treatment/Interventions ADLs/Self Care Home Management;Cryotherapy;Electrical Stimulation;Iontophoresis 4mg /ml Dexamethasone;Moist Heat;Gait training;Stair training;Functional mobility training;Therapeutic activities;Therapeutic exercise;Balance training;Neuromuscular re-education;Manual techniques;Patient/family education;Passive range of motion;Dry needling;Taping    PT Next Visit Plan  Continue with stretching, strengthening, stabilization Lt hip and LE including continue core/ pelvis/hip stabilization exercises; continue with DN and manual work Lt hip/LE    [place] [id number]    Consulted and Agree with Plan of Care Patient             Patient will benefit from skilled therapeutic intervention in order to improve the following deficits and impairments:     Visit Diagnosis: Pain in left hip  Muscle weakness (generalized)  Stiffness of left hip, not elsewhere classified  Sacrococcygeal disorders, not elsewhere classified  Other abnormalities of gait and mobility     Problem List Patient Active Problem List   Diagnosis Date Noted   Primary osteoarthritis of left hip 03/23/2021   Family history of osteoporosis 06/12/2018   History of breast cancer 06/12/2018   Advance directive discussed with patient 04/03/2018   Pap smear of cervix declined 04/03/2018   Lumbago 08/16/2017   Basal cell carcinoma 07/17/2017   Vaginismus 07/17/2017   Abnormal TSH 03/28/2017   Diarrhea 11/05/2016   Port catheter in place 10/23/2016   Genetic testing 08/15/2016   Malignant neoplasm of upper-outer quadrant of left breast in female, estrogen receptor positive ([place]) 07/10/2016   Allergic rhinitis 06/13/2016   History of basal cell carcinoma 06/13/2016   Eczema 06/13/2016   Hyperlipemia 06/13/2016   History of colon polyps 06/13/2016   Plantar fascial fibromatosis 06/13/2016   RLS (restless legs syndrome) 06/13/2016   Subclinical  hypothyroidism 07/20/2013   Bunion 01/21/2013   Osteopenia 01/20/2013    Karlina Suares Nilda Simmer, PT, MPH  06/26/2021, 11:09 AM  [facility] [place], [name], [address] Phone: [phone]   Fax:  [phone]  Name: NAASIA WEILBACHER MRN: [id number] Date of Birth: November 02, 1955

## 2021-06-29 ENCOUNTER — Ambulatory Visit: Payer: Medicare Other | Admitting: Rehabilitative and Restorative Service Providers"

## 2021-06-29 ENCOUNTER — Encounter: Payer: Self-pay | Admitting: Rehabilitative and Restorative Service Providers"

## 2021-06-29 ENCOUNTER — Other Ambulatory Visit: Payer: Self-pay

## 2021-06-29 DIAGNOSIS — M25652 Stiffness of left hip, not elsewhere classified: Secondary | ICD-10-CM

## 2021-06-29 DIAGNOSIS — R2689 Other abnormalities of gait and mobility: Secondary | ICD-10-CM

## 2021-06-29 DIAGNOSIS — M25552 Pain in left hip: Secondary | ICD-10-CM

## 2021-06-29 DIAGNOSIS — M6281 Muscle weakness (generalized): Secondary | ICD-10-CM

## 2021-06-29 DIAGNOSIS — M533 Sacrococcygeal disorders, not elsewhere classified: Secondary | ICD-10-CM

## 2021-06-29 NOTE — Therapy (Signed)
[facility] [address] [place], [name], [address] Phone: [phone]   Fax:  [phone]  Physical Therapy Treatment and 20th Medicare visit note   Progress Note Reporting Period 05/25/21 to 202/09/23 See note below for Objective Data and Assessment of Progress/Goals.      Patient Details  Name: Jamie [name] MRN: [id number] Date of Birth: July 22, 1955 Referring Provider (PT): Silverio Decamp, MD   Encounter Date: 06/29/2021   PT End of Session - 06/29/21 1401     Visit Number 20    Number of Visits 24    Date for PT Re-Evaluation 07/17/21    Authorization Type Medicare    Authorization - Visit Number 24    Progress Note Due on Visit 20    PT Start Time 9169    PT Stop Time 1448    PT Time Calculation (min) 50 min    Activity Tolerance Patient tolerated treatment well             Past Medical History:  Diagnosis Date   Allergy    Anemia    Arthritis    feet   Basal cell carcinoma    Breast cancer ([facility]) 06/2016   left breast   Colon polyps    Eczema    Genetic testing 08/15/2016   Ms. Iturralde underwent genetic testing for hereditary cancer syndrome through [employer]'s 43-gene Common Hereditary Cancers Panel. Ms. 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 Result report is dated 08/15/2016. Please see genetic counseling documentation from 08/17/2016 for further discussion.   History of radiation therapy 01/02/17-01/31/17   left breast was treated to 42.72 Gy in 16 fractions, left breast boost 10 Gy in 5 fractions   Personal history of chemotherapy    Personal history of radiation therapy    PONV (postoperative nausea and vomiting)     Past Surgical History:  Procedure Laterality Date   BONE SPUR Bilateral 2001 AND 1988   BREAST BIOPSY     BREAST LUMPECTOMY Left    07/2016   BREAST LUMPECTOMY WITH RADIOACTIVE SEED AND  SENTINEL LYMPH NODE BIOPSY Left 07/26/2016   Procedure: BREAST LUMPECTOMY WITH RADIOACTIVE SEED AND SENTINEL LYMPH NODE BIOPSY;  Surgeon: [name], MD;  Location: [facility];  Service: General;  Laterality: Left;   BUNIONECTOMY Bilateral 02/2012   COLONOSCOPY W/ POLYPECTOMY  08/2007   DILATION AND CURETTAGE OF UTERUS  2002   MOHS SURGERY  2010   PORT-A-CATH REMOVAL N/A 07/18/2017   Procedure: REMOVAL PORT-A-CATH;  Surgeon: [name], MD;  Location: [facility];  Service: General;  Laterality: N/A;   PORTACATH PLACEMENT Right 07/26/2016   Procedure: INSERTION PORT-A-CATH;  Surgeon: [name], MD;  Location: [facility];  Service: General;  Laterality: Right;    There were no vitals filed for this visit.   Subjective Assessment - 06/29/21 1401     Subjective Some discomfort with walking - feels stiff or tight but not having the feeling of weakness she was having. Overall doing well and pleased with progress. Much less pian and improved functional activity level.    Currently in Pain? Yes    Pain Score 2     Pain Location Hip    Pain Orientation Left;Anterior;Posterior    Pain Descriptors / Indicators Tightness;Aching    Pain Type Acute pain    Pain Onset More than a month ago  Pain Frequency Intermittent                [facility] PT Assessment - 06/29/21 0001       Assessment   Medical Diagnosis M16.12 (ICD-10-CM) - Primary osteoarthritis of left hip    Referring Provider (PT) Silverio Decamp, MD    Onset Date/Surgical Date --   spring/summer 2022   Hand Dominance Right    Prior Therapy Several years ago for R leg/back      Observation/Other Assessments   Focus on Therapeutic Outcomes (FOTO)  69      Strength   Right Hip Flexion 5/5    Right Hip Extension 5/5    Right Hip External Rotation  5/5    Right Hip Internal Rotation 5/5    Right Hip ABduction 4+/5    Left Hip Flexion --   5-/5 painful   Left Hip External Rotation 5/5     Left Hip Internal Rotation 5/5    Left Hip ABduction 5/5    Right Knee Flexion 5/5    Right Knee Extension 5/5    Left Knee Flexion 5/5    Left Knee Extension 5/5      Palpation   SI assessment  no asymmetry noted    Palpation comment decreasing muscular tightness Lt hip flexors including psoas and iliacus and adductors; Lt piriformis and gluts      Ambulation/Gait   Ambulation Distance (Feet) --   1/2 mile on treadmill   Gait velocity 3.5 mph    Gait Comments improving gait pattern with more upright posture and no significant limp Lt LE                           [facility] Adult PT Treatment/Exercise - 06/29/21 0001       Knee/Hip Exercises: Stretches   Hip Flexor Stretch Left;3 reps;30 seconds    Hip Flexor Stretch Limitations sitting; standing; supine thomas    Piriformis Stretch Left;2 reps;30 seconds    Piriformis Stretch Limitations supine travell - applying pressure through the anterior hip to inhibit hip flexors    Other Knee/Hip Stretches supine adductor stretch (L) with strap 2x30 sec      Knee/Hip Exercises: Aerobic   Tread Mill 1.8-2.2 mph x 5 min for warm up      Knee/Hip Exercises: Standing   Hip Abduction Stengthening;Left;10 reps;Knee straight    Abduction Limitations Rt hip on coregeous ball knee bent; Lt hip pressing into wall    Wall Squat 10 reps    Wall Squat Limitations 10 sec hold    Other Standing Knee Exercises antirotation green x 10 each side      Moist Heat Therapy   Number Minutes Moist Heat 10 Minutes    Moist Heat Location Hip   anterior     Electrical Stimulation   Electrical Stimulation Location posterior hip; anterior hip; proximal adductors Lt LE    Electrical Stimulation Action mAmp x 5 min posterior hip; repeated at quad    Electrical Stimulation Parameters to tolerance    Electrical Stimulation Goals Pain;Tone      Manual Therapy   Manual therapy comments skilled palpation to assess response to Dn and manual work     Joint Mobilization PA mobs Lt greater trochanter    Soft tissue mobilization deep tissue work through the Lt hip flexors/psoas/hip flexors; hip adductors; posterior Lt hip through the piriformis and gluts    Passive ROM IR/ER  Lt hip knee in flexion/hip extended              Trigger Point Dry Needling - 06/29/21 0001     Consent Given? Yes    Education Handout Provided Previously provided    Printmaker Performed with Dry Needling Yes    E-stim with Dry Needling Details mAmpx 5 min; microcurrent x 5 min    Quadriceps Response Palpable increased muscle length;Twitch response elicited    Gluteus Maximus Response Palpable increased muscle length    Piriformis Response Palpable increased muscle length                        PT Long Term Goals - 06/29/21 1419       PT LONG TERM GOAL #1   Title Pt will be independent with advanced HEP    Time 6    Period Weeks    Status On-going    Target Date 07/17/21      PT LONG TERM GOAL #2   Title Pt will report decrease in L hip pain by at least 75- 100%    Time 6    Period Weeks    Status Achieved    Target Date 07/17/21      PT LONG TERM GOAL #3   Title Pt will be able to perform a flight of stairs with no pain    Time 6    Period Weeks    Status Partially Met      PT LONG TERM GOAL #4   Title Pt will have improved FOTO score to at least 75    Time 6    Period Weeks    Status On-going      PT LONG TERM GOAL #5   Title Pt will be able to demo at least 4+/5 bilat hip abductor strength    Time 6    Period Weeks    Status Achieved    Target Date 07/17/21                   Plan - 06/29/21 1420     Clinical Impression Statement Patient reports excellent progress in the past month with good resolution of pain and increased functional activities. Goals of therapy have been partially accomplished and patient continues to progress toward remaining goals. She will benefit from additional visits to  reach maximum rehab potential.    Rehab Potential Good    PT Duration 6 weeks    PT Treatment/Interventions ADLs/Self Care Home Management;Cryotherapy;Electrical Stimulation;Iontophoresis 47m/ml Dexamethasone;Moist Heat;Gait training;Stair training;Functional mobility training;Therapeutic activities;Therapeutic exercise;Balance training;Neuromuscular re-education;Manual techniques;Patient/family education;Passive range of motion;Dry needling;Taping    PT Next Visit Plan Continue with stretching, strengthening, stabilization Lt hip and LE including continue core/ pelvis/hip stabilization exercises; continue with DN and manual work Lt hip/LE    [id number]   Consulted and Agree with Plan of Care Patient             Patient will benefit from skilled therapeutic intervention in order to improve the following deficits and impairments:     Visit Diagnosis: Pain in left hip  Muscle weakness (generalized)  Stiffness of left hip, not elsewhere classified  Sacrococcygeal disorders, not elsewhere classified  Other abnormalities of gait and mobility     Problem List Patient Active Problem List   Diagnosis Date Noted   Primary osteoarthritis of left hip 03/23/2021   Family history of osteoporosis 06/12/2018   History  of breast cancer 06/12/2018   Advance directive discussed with patient 04/03/2018   Pap smear of cervix declined 04/03/2018   Lumbago 08/16/2017   Basal cell carcinoma 07/17/2017   Vaginismus 07/17/2017   Abnormal TSH 03/28/2017   Diarrhea 11/05/2016   Port catheter in place 10/23/2016   Genetic testing 08/15/2016   Malignant neoplasm of upper-outer quadrant of left breast in female, estrogen receptor positive ([place]) 07/10/2016   Allergic rhinitis 06/13/2016   History of basal cell carcinoma 06/13/2016   Eczema 06/13/2016   Hyperlipemia 06/13/2016   History of colon polyps 06/13/2016   Plantar fascial fibromatosis 06/13/2016   RLS (restless legs  syndrome) 06/13/2016   Subclinical hypothyroidism 07/20/2013   Bunion 01/21/2013   Osteopenia 01/20/2013    Chamya Hunton Nilda Simmer, PT, MPH  06/29/2021, 2:44 PM  [facility] [place] [address] [place], [name], [address] Phone: [phone]   Fax:  [phone]  Name: Jamie [name] MRN: [id number] Date of Birth: 07/29/1955

## 2021-07-03 ENCOUNTER — Encounter: Payer: Self-pay | Admitting: Rehabilitative and Restorative Service Providers"

## 2021-07-03 ENCOUNTER — Ambulatory Visit: Payer: Medicare Other | Admitting: Rehabilitative and Restorative Service Providers"

## 2021-07-03 ENCOUNTER — Other Ambulatory Visit: Payer: Self-pay

## 2021-07-03 DIAGNOSIS — M533 Sacrococcygeal disorders, not elsewhere classified: Secondary | ICD-10-CM

## 2021-07-03 DIAGNOSIS — M6281 Muscle weakness (generalized): Secondary | ICD-10-CM

## 2021-07-03 DIAGNOSIS — M25552 Pain in left hip: Secondary | ICD-10-CM

## 2021-07-03 DIAGNOSIS — R2689 Other abnormalities of gait and mobility: Secondary | ICD-10-CM

## 2021-07-03 DIAGNOSIS — M25652 Stiffness of left hip, not elsewhere classified: Secondary | ICD-10-CM

## 2021-07-03 NOTE — Therapy (Signed)
[facility] [name] [place], [name], [address] Phone: [phone]   Fax:  [phone]  Physical Therapy Treatment  Patient Details  Name: Jamie [name] MRN: [id number] Date of Birth: 05-02-1956 Referring Provider (PT): Silverio Decamp, MD   Encounter Date: 07/03/2021   PT End of Session - 07/03/21 0842     Visit Number 21    Number of Visits 24    Date for PT Re-Evaluation 07/17/21    Authorization Type Medicare    Progress Note Due on Visit 52    PT Start Time 0840    PT Stop Time 0931    PT Time Calculation (min) 51 min    Activity Tolerance Patient tolerated treatment well             Past Medical History:  Diagnosis Date   Allergy    Anemia    Arthritis    feet   Basal cell carcinoma    Breast cancer ([place]) 06/2016   left breast   Colon polyps    Eczema    Genetic testing 08/15/2016   Ms. Briles underwent genetic testing for hereditary cancer syndrome through [employer]'s 43-gene Common Hereditary Cancers Panel. Ms. 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 Result report is dated 08/15/2016. Please see genetic counseling documentation from 08/17/2016 for further discussion.   History of radiation therapy 01/02/17-01/31/17   left breast was treated to 42.72 Gy in 16 fractions, left breast boost 10 Gy in 5 fractions   Personal history of chemotherapy    Personal history of radiation therapy    PONV (postoperative nausea and vomiting)     Past Surgical History:  Procedure Laterality Date   BONE SPUR Bilateral 2001 AND 1988   BREAST BIOPSY     BREAST LUMPECTOMY Left    07/2016   BREAST LUMPECTOMY WITH RADIOACTIVE SEED AND SENTINEL LYMPH NODE BIOPSY Left 07/26/2016   Procedure: BREAST LUMPECTOMY WITH RADIOACTIVE SEED AND SENTINEL LYMPH NODE BIOPSY;  Surgeon: [name], MD;  Location: [facility];  Service:  General;  Laterality: Left;   BUNIONECTOMY Bilateral 02/2012   COLONOSCOPY W/ POLYPECTOMY  08/2007   DILATION AND CURETTAGE OF UTERUS  2002   MOHS SURGERY  2010   PORT-A-CATH REMOVAL N/A 07/18/2017   Procedure: REMOVAL PORT-A-CATH;  Surgeon: [name], MD;  Location: [facility];  Service: General;  Laterality: N/A;   PORTACATH PLACEMENT Right 07/26/2016   Procedure: INSERTION PORT-A-CATH;  Surgeon: [name], MD;  Location: [facility];  Service: General;  Laterality: Right;    There were no vitals filed for this visit.   Subjective Assessment - 07/03/21 0843     Subjective Tight soreness in the Lt hip area - likely from increased activity having a party yesterday.    Currently in Pain? No/denies    Pain Score 0-No pain    Pain Location Hip    Pain Orientation Left;Anterior;Posterior    Pain Descriptors / Indicators Tightness;Aching    Pain Type Acute pain                               [facility] Adult PT Treatment/Exercise - 07/03/21 0001       Knee/Hip Exercises: Stretches   Hip Flexor Stretch Left;3 reps;30 seconds    Hip Flexor Stretch Limitations sitting; standing; supine thomas  Piriformis Stretch Left;2 reps;30 seconds    Piriformis Stretch Limitations supine travell - applying pressure through the anterior hip to inhibit hip flexors    Other Knee/Hip Stretches supine adductor stretch (L) with strap 2x30 sec      Knee/Hip Exercises: Aerobic   Tread Mill 1.8-2.2 mph x 5 min for warm up      Knee/Hip Exercises: Standing   Other Standing Knee Exercises hip adduction pressing into foam roll at wall hold 5 sec x 3 reps      Knee/Hip Exercises: Sidelying   Hip ABduction Strengthening;Left;10 reps    Hip ABduction Limitations keeping hips rolled forward and Lt extended past plane of body leading with heel    Other Sidelying Knee/Hip Exercises rainbow arc tapping toe 3 times x 4 reps Lt    Other Sidelying Knee/Hip Exercises side plank Rt/Lt 30  sec with topmost leg lift x 3 reps      Moist Heat Therapy   Number Minutes Moist Heat 15 Minutes    Moist Heat Location Hip   anterior     Electrical Stimulation   Electrical Stimulation Location posterior hip    Electrical Stimulation Action mAmp x 5 min    Electrical Stimulation Parameters to tolerance    Electrical Stimulation Goals Pain;Tone      Manual Therapy   Manual therapy comments skilled palpation to assess response to Dn and manual work    Joint Mobilization PA mobs Lt greater trochanter    Soft tissue mobilization deep tissue work through the Lt hip flexors/psoas/hip flexors; hip adductors; posterior Lt hip through the piriformis and gluts    Passive ROM IR/ER Lt hip knee in flexion/hip extended              Trigger Point Dry Needling - 07/03/21 0001     Consent Given? Yes    Education Handout Provided Previously provided    Gluteus Minimus Response Palpable increased muscle length;Twitch response elicited    Gluteus Medius Response Palpable increased muscle length;Twitch response elicited    Gluteus Maximus Response Palpable increased muscle length;Twitch response elicited    Piriformis Response Palpable increased muscle length;Twitch response elicited                   PT Education - 07/03/21 0854     Education Details HEP    Person(s) Educated Patient    Methods Explanation;Demonstration;Tactile cues;Verbal cues;Handout    Comprehension Verbalized understanding;Returned demonstration;Verbal cues required;Tactile cues required                 PT Long Term Goals - 06/29/21 1419       PT LONG TERM GOAL #1   Title Pt will be independent with advanced HEP    Time 6    Period Weeks    Status On-going    Target Date 07/17/21      PT LONG TERM GOAL #2   Title Pt will report decrease in L hip pain by at least 75- 100%    Time 6    Period Weeks    Status Achieved    Target Date 07/17/21      PT LONG TERM GOAL #3   Title Pt will be  able to perform a flight of stairs with no pain    Time 6    Period Weeks    Status Partially Met      PT LONG TERM GOAL #4   Title Pt will have improved FOTO score  to at least 62    Time 6    Period Weeks    Status On-going      PT LONG TERM GOAL #5   Title Pt will be able to demo at least 4+/5 bilat hip abductor strength    Time 6    Period Weeks    Status Achieved    Target Date 07/17/21                   Plan - 07/03/21 0849     Clinical Impression Statement Some soreness and tightness from increased activity yesterday but no increase in pain. Good resolution of symptoms with treatment. Note continued decrease in palpable tightness Lt lower quater. Added strengthening exercises for Lt hip abductors.    Rehab Potential Good    PT Frequency 2x / week    PT Duration 6 weeks    PT Treatment/Interventions ADLs/Self Care Home Management;Cryotherapy;Electrical Stimulation;Iontophoresis 4m/ml Dexamethasone;Moist Heat;Gait training;Stair training;Functional mobility training;Therapeutic activities;Therapeutic exercise;Balance training;Neuromuscular re-education;Manual techniques;Patient/family education;Passive range of motion;Dry needling;Taping    PT Next Visit Plan Continue with stretching, strengthening, stabilization Lt hip and LE including continue core/ pelvis/hip stabilization exercises; continue with DN and manual work Lt hip/LE    [email or web address]   Consulted and Agree with Plan of Care Patient             Patient will benefit from skilled therapeutic intervention in order to improve the following deficits and impairments:     Visit Diagnosis: Pain in left hip  Muscle weakness (generalized)  Stiffness of left hip, not elsewhere classified  Sacrococcygeal disorders, not elsewhere classified  Other abnormalities of gait and mobility     Problem List Patient Active Problem List   Diagnosis Date Noted   Primary osteoarthritis of left  hip 03/23/2021   Family history of osteoporosis 06/12/2018   History of breast cancer 06/12/2018   Advance directive discussed with patient 04/03/2018   Pap smear of cervix declined 04/03/2018   Lumbago 08/16/2017   Basal cell carcinoma 07/17/2017   Vaginismus 07/17/2017   Abnormal TSH 03/28/2017   Diarrhea 11/05/2016   Port catheter in place 10/23/2016   Genetic testing 08/15/2016   Malignant neoplasm of upper-outer quadrant of left breast in female, estrogen receptor positive ([facility] 07/10/2016   Allergic rhinitis 06/13/2016   History of basal cell carcinoma 06/13/2016   Eczema 06/13/2016   Hyperlipemia 06/13/2016   History of colon polyps 06/13/2016   Plantar fascial fibromatosis 06/13/2016   RLS (restless legs syndrome) 06/13/2016   Subclinical hypothyroidism 07/20/2013   Bunion 01/21/2013   Osteopenia 01/20/2013    Stacye Noori PNilda Simmer PT, MPH  07/03/2021, 9:28 AM  [facility]enterSPrud[place]Jones[place]Ten [place] [address]Phone: [phone]  Fax:  [phone] Name: EYISELL SPRUNGERMRN: [id number] of Birth: 825-Jan-1957

## 2021-07-03 NOTE — Patient Instructions (Signed)
Access Code: [email or web address] URL: [email or web address] Date: 07/03/2021 Prepared by: Gillermo Murdoch  Exercises Bridge with Hip Abduction and Resistance - 1 x daily - 7 x weekly - 2 sets - 10 reps Seated Hip Flexor Stretch - 2 x daily - 7 x weekly - 1 sets - 2 reps - 15-3030 seconds hold Sidelying ITB Stretch off Table - 1 x daily - 7 x weekly - 3 sets - 2 reps - 15-30 seconds hold Sidelying Hip Abduction - 1 x daily - 7 x weekly - 2 sets - 10 reps Prone Hip Extension - 1 x daily - 7 x weekly - 2 sets - 10 reps Bird Dog - 1 x daily - 7 x weekly - 2 sets - 10 reps Hip Flexor Stretch at Edge of Bed - 2 x daily - 7 x weekly - 1 sets - 3 reps - 30 sec hold Side Stepping with Resistance at Thighs - 1 x daily - 7 x weekly Wall Quarter Squat - 2 x daily - 7 x weekly - 1-2 sets - 10 reps - 5-10 sec hold Anti-Rotation Lateral Stepping with Press - 2 x daily - 7 x weekly - 1-2 sets - 10 reps - 2-3 sec hold Supine Piriformis Stretch with Leg Straight - 2 x daily - 7 x weekly - 1 sets - 3 reps - 30 sec hold Side Plank on Elbow with Hip Abduction - 2 x daily - 7 x weekly - 1 sets - 3 reps - 3-5 sec hold

## 2021-07-06 ENCOUNTER — Other Ambulatory Visit: Payer: Self-pay

## 2021-07-06 ENCOUNTER — Encounter: Payer: Self-pay | Admitting: Rehabilitative and Restorative Service Providers"

## 2021-07-06 ENCOUNTER — Ambulatory Visit: Payer: Medicare Other | Admitting: Rehabilitative and Restorative Service Providers"

## 2021-07-06 DIAGNOSIS — M6281 Muscle weakness (generalized): Secondary | ICD-10-CM

## 2021-07-06 DIAGNOSIS — R2689 Other abnormalities of gait and mobility: Secondary | ICD-10-CM

## 2021-07-06 DIAGNOSIS — M25552 Pain in left hip: Secondary | ICD-10-CM | POA: Diagnosis not present

## 2021-07-06 DIAGNOSIS — M25652 Stiffness of left hip, not elsewhere classified: Secondary | ICD-10-CM

## 2021-07-06 DIAGNOSIS — M533 Sacrococcygeal disorders, not elsewhere classified: Secondary | ICD-10-CM

## 2021-07-06 NOTE — Therapy (Signed)
[facility] [place], [name], [address] Phone: [phone]   Fax:  [phone]  Physical Therapy Treatment  Patient Details  Name: Jamie [name] MRN: [id number] Date of Birth: 1956/03/27 Referring Provider (PT): Silverio Decamp, MD   Encounter Date: 07/06/2021   PT End of Session - 07/06/21 1019     Visit Number 22    Number of Visits 24    Date for PT Re-Evaluation 07/17/21    Authorization Type Medicare    Authorization - Visit Number 24    Progress Note Due on Visit 30    PT Start Time 1013    PT Stop Time 1104    PT Time Calculation (min) 51 min    Activity Tolerance Patient tolerated treatment well             Past Medical History:  Diagnosis Date   Allergy    Anemia    Arthritis    feet   Basal cell carcinoma    Breast cancer ([place]) 06/2016   left breast   Colon polyps    Eczema    Genetic testing 08/15/2016   Ms. Muela underwent genetic testing for hereditary cancer syndrome through [employer]'s 43-gene Common Hereditary Cancers Panel. Ms. 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 Result report is dated 08/15/2016. Please see genetic counseling documentation from 08/17/2016 for further discussion.   History of radiation therapy 01/02/17-01/31/17   left breast was treated to 42.72 Gy in 16 fractions, left breast boost 10 Gy in 5 fractions   Personal history of chemotherapy    Personal history of radiation therapy    PONV (postoperative nausea and vomiting)     Past Surgical History:  Procedure Laterality Date   BONE SPUR Bilateral 2001 AND 1988   BREAST BIOPSY     BREAST LUMPECTOMY Left    07/2016   BREAST LUMPECTOMY WITH RADIOACTIVE SEED AND SENTINEL LYMPH NODE BIOPSY Left 07/26/2016   Procedure: BREAST LUMPECTOMY WITH RADIOACTIVE SEED AND SENTINEL LYMPH NODE BIOPSY;  Surgeon: [name], MD;  Location:  [facility];  Service: General;  Laterality: Left;   BUNIONECTOMY Bilateral 02/2012   COLONOSCOPY W/ POLYPECTOMY  08/2007   DILATION AND CURETTAGE OF UTERUS  2002   MOHS SURGERY  2010   PORT-A-CATH REMOVAL N/A 07/18/2017   Procedure: REMOVAL PORT-A-CATH;  Surgeon: [name], MD;  Location: [facility];  Service: General;  Laterality: N/A;   PORTACATH PLACEMENT Right 07/26/2016   Procedure: INSERTION PORT-A-CATH;  Surgeon: [name], MD;  Location: [facility];  Service: General;  Laterality: Right;    There were no vitals filed for this visit.   Subjective Assessment - 07/06/21 1020     Subjective Tight soreness in the Lt hip area improving through the week.Feeling better about things. Feels good with HEP    Currently in Pain? No/denies    Pain Score 0-No pain    Pain Location Hip    Pain Orientation Left;Anterior;Posterior    Pain Descriptors / Indicators Tightness                               [facility] Adult PT Treatment/Exercise - 07/06/21 0001       Knee/Hip Exercises: Stretches   Hip Flexor Stretch Left;3 reps;30 seconds    Hip Flexor Stretch Limitations sitting; standing; supine  thomas    Piriformis Stretch Left;2 reps;30 seconds    Piriformis Stretch Limitations supine travell - applying pressure through the anterior hip to inhibit hip flexors    Other Knee/Hip Stretches supine adductor stretch (L) with strap 2x30 sec      Knee/Hip Exercises: Aerobic   Tread Mill 1.8-2.2 mph x 5 min for warm up      Moist Heat Therapy   Number Minutes Moist Heat 15 Minutes    Moist Heat Location Hip   anterior     Electrical Stimulation   Electrical Stimulation Location posterior hip    Electrical Stimulation Action mAmp x 5 min    Electrical Stimulation Parameters to tolerance    Electrical Stimulation Goals Pain;Tone      Manual Therapy   Manual therapy comments skilled palpation to assess response to Dn and manual work    Joint  Mobilization PA mobs Lt greater trochanter    Soft tissue mobilization deep tissue work through the Lt hip flexors/psoas/hip flexors; hip adductors; posterior Lt hip through the piriformis and gluts    Passive ROM IR/ER Lt hip knee in flexion/hip extended              Trigger Point Dry Needling - 07/06/21 0001     Consent Given? Yes    Education Handout Provided Previously provided    Printmaker Performed with Dry Needling Yes    E-stim with Dry Needling Details mAmp x 8 min    Gluteus Minimus Response Palpable increased muscle length;Twitch response elicited    Gluteus Medius Response Palpable increased muscle length;Twitch response elicited    Gluteus Maximus Response Palpable increased muscle length;Twitch response elicited    Piriformis Response Palpable increased muscle length;Twitch response elicited    Iliopsoas Response Palpable increased muscle length                        PT Long Term Goals - 06/29/21 1419       PT LONG TERM GOAL #1   Title Pt will be independent with advanced HEP    Time 6    Period Weeks    Status On-going    Target Date 07/17/21      PT LONG TERM GOAL #2   Title Pt will report decrease in L hip pain by at least 75- 100%    Time 6    Period Weeks    Status Achieved    Target Date 07/17/21      PT LONG TERM GOAL #3   Title Pt will be able to perform a flight of stairs with no pain    Time 6    Period Weeks    Status Partially Met      PT LONG TERM GOAL #4   Title Pt will have improved FOTO score to at least 75    Time 6    Period Weeks    Status On-going      PT LONG TERM GOAL #5   Title Pt will be able to demo at least 4+/5 bilat hip abductor strength    Time 6    Period Weeks    Status Achieved    Target Date 07/17/21                   Plan - 07/06/21 1042     Clinical Impression Statement Decreased soreness through the posterior hip. No pain. Working well with exercises for stretching  and  strengthening. Some persistent muscular tightness noted in the Lt posterior hip - piriformis and glut min/med as wel las iliopsoas. Responds well to intervention.    Rehab Potential Good    PT Frequency 2x / week    PT Duration 6 weeks    PT Treatment/Interventions ADLs/Self Care Home Management;Cryotherapy;Electrical Stimulation;Iontophoresis 48m/ml Dexamethasone;Moist Heat;Gait training;Stair training;Functional mobility training;Therapeutic activities;Therapeutic exercise;Balance training;Neuromuscular re-education;Manual techniques;Patient/family education;Passive range of motion;Dry needling;Taping    PT Next Visit Plan Continue with stretching, strengthening, stabilization Lt hip and LE including continue core/ pelvis/hip stabilization exercises; continue with DN and manual work Lt hip/LE    PLake [id number]   Consulted and Agree with Plan of Care Patient             Patient will benefit from skilled therapeutic intervention in order to improve the following deficits and impairments:     Visit Diagnosis: Pain in left hip  Muscle weakness (generalized)  Stiffness of left hip, not elsewhere classified  Sacrococcygeal disorders, not elsewhere classified  Other abnormalities of gait and mobility     Problem List Patient Active Problem List   Diagnosis Date Noted   Primary osteoarthritis of left hip 03/23/2021   Family history of osteoporosis 06/12/2018   History of breast cancer 06/12/2018   Advance directive discussed with patient 04/03/2018   Pap smear of cervix declined 04/03/2018   Lumbago 08/16/2017   Basal cell carcinoma 07/17/2017   Vaginismus 07/17/2017   Abnormal TSH 03/28/2017   Diarrhea 11/05/2016   Port catheter in place 10/23/2016   Genetic testing 08/15/2016   Malignant neoplasm of upper-outer quadrant of left breast in female, estrogen receptor positive ([place] 07/10/2016   Allergic rhinitis 06/13/2016   History of basal cell carcinoma  06/13/2016   Eczema 06/13/2016   Hyperlipemia 06/13/2016   History of colon polyps 06/13/2016   Plantar fascial fibromatosis 06/13/2016   RLS (restless legs syndrome) 06/13/2016   Subclinical hypothyroidism 07/20/2013   Bunion 01/21/2013   Osteopenia 01/20/2013    Angellica Maddison PNilda Simmer PT, MPH  07/06/2021, 10:58 AM  [facility]1[place] CityNPaw [place]awSW[place]thington[place] [address]Phone: [phone]  Fax:  [phone] Name: EMORIAH SHAWLEYMRN: [id number] of Birth: [birth year]

## 2021-07-10 ENCOUNTER — Encounter: Payer: Medicare Other | Admitting: Rehabilitative and Restorative Service Providers"

## 2021-07-17 ENCOUNTER — Encounter: Payer: Medicare Other | Admitting: Rehabilitative and Restorative Service Providers"

## 2021-07-28 ENCOUNTER — Other Ambulatory Visit: Payer: Self-pay | Admitting: Hematology and Oncology

## 2021-07-28 DIAGNOSIS — R921 Mammographic calcification found on diagnostic imaging of breast: Secondary | ICD-10-CM

## 2021-07-31 ENCOUNTER — Other Ambulatory Visit: Payer: Self-pay | Admitting: Family Medicine

## 2021-07-31 ENCOUNTER — Other Ambulatory Visit: Payer: Self-pay | Admitting: Cardiovascular Disease

## 2021-07-31 DIAGNOSIS — R921 Mammographic calcification found on diagnostic imaging of breast: Secondary | ICD-10-CM

## 2021-08-01 ENCOUNTER — Ambulatory Visit
Admission: RE | Admit: 2021-08-01 | Discharge: 2021-08-01 | Disposition: A | Discharge status: Home / Self Care | Payer: Medicare Other | Source: Ambulatory Visit | Attending: Oncology | Admitting: Oncology

## 2021-08-01 DIAGNOSIS — R921 Mammographic calcification found on diagnostic imaging of breast: Secondary | ICD-10-CM

## 2021-08-30 ENCOUNTER — Encounter: Payer: Self-pay | Admitting: Hematology and Oncology

## 2021-08-30 ENCOUNTER — Other Ambulatory Visit: Payer: Self-pay

## 2021-08-30 ENCOUNTER — Inpatient Hospital Stay (HOSPITAL_BASED_OUTPATIENT_CLINIC_OR_DEPARTMENT_OTHER): Payer: Medicare Other | Admitting: Hematology and Oncology

## 2021-08-30 ENCOUNTER — Inpatient Hospital Stay: Payer: Medicare Other | Attending: Hematology and Oncology

## 2021-08-30 VITALS — BP 109/72 | HR 66 | Temp 97.7°F | Resp 16 | Ht 64.0 in | Wt 151.0 lb

## 2021-08-30 DIAGNOSIS — Z800 Family history of malignant neoplasm of digestive organs: Secondary | ICD-10-CM | POA: Diagnosis not present

## 2021-08-30 DIAGNOSIS — Z923 Personal history of irradiation: Secondary | ICD-10-CM | POA: Insufficient documentation

## 2021-08-30 DIAGNOSIS — C50412 Malignant neoplasm of upper-outer quadrant of left female breast: Secondary | ICD-10-CM | POA: Diagnosis not present

## 2021-08-30 DIAGNOSIS — Z85828 Personal history of other malignant neoplasm of skin: Secondary | ICD-10-CM | POA: Diagnosis not present

## 2021-08-30 DIAGNOSIS — Z170 Estrogen receptor positive status [ER+]: Secondary | ICD-10-CM

## 2021-08-30 DIAGNOSIS — M85852 Other specified disorders of bone density and structure, left thigh: Secondary | ICD-10-CM | POA: Diagnosis not present

## 2021-08-30 DIAGNOSIS — R232 Flushing: Secondary | ICD-10-CM | POA: Insufficient documentation

## 2021-08-30 DIAGNOSIS — Z9221 Personal history of antineoplastic chemotherapy: Secondary | ICD-10-CM | POA: Insufficient documentation

## 2021-08-30 DIAGNOSIS — C50919 Malignant neoplasm of unspecified site of unspecified female breast: Secondary | ICD-10-CM | POA: Insufficient documentation

## 2021-08-30 DIAGNOSIS — Z79811 Long term (current) use of aromatase inhibitors: Secondary | ICD-10-CM | POA: Diagnosis not present

## 2021-08-30 DIAGNOSIS — Z1502 Genetic susceptibility to malignant neoplasm of ovary: Secondary | ICD-10-CM | POA: Insufficient documentation

## 2021-08-30 LAB — CMP (CANCER CENTER ONLY)
ALT: 12 U/L (ref 0–44)
AST: 17 U/L (ref 15–41)
Albumin: 4.2 g/dL (ref 3.5–5.0)
Alkaline Phosphatase: 59 U/L (ref 38–126)
Anion gap: 3 — ABNORMAL LOW (ref 5–15)
BUN: 13 mg/dL (ref 8–23)
CO2: 31 mmol/L (ref 22–32)
Calcium: 9.3 mg/dL (ref 8.9–10.3)
Chloride: 104 mmol/L (ref 98–111)
Creatinine: 0.79 mg/dL (ref 0.44–1.00)
GFR, Estimated: >60 mL/min (ref >60)
Glucose, Bld: 63 mg/dL — ABNORMAL LOW (ref 70–99)
Potassium: 4.2 mmol/L (ref 3.5–5.1)
Sodium: 138 mmol/L (ref 135–145)
Total Bilirubin: 0.4 mg/dL (ref 0.3–1.2)
Total Protein: 7.1 g/dL (ref 6.5–8.1)

## 2021-08-30 LAB — CBC WITH DIFFERENTIAL (CANCER CENTER ONLY)
Abs Immature Granulocytes: 0.02 10*3/uL (ref 0.00–0.07)
Basophils Absolute: 0.1 10*3/uL (ref 0.0–0.1)
Basophils Relative: 1 %
Eosinophils Absolute: 0.3 10*3/uL (ref 0.0–0.5)
Eosinophils Relative: 5 %
HCT: 38.9 % (ref 36.0–46.0)
Hemoglobin: 13 g/dL (ref 12.0–15.0)
Immature Granulocytes: 0 %
Lymphocytes Relative: 30 %
Lymphs Abs: 1.7 10*3/uL (ref 0.7–4.0)
MCH: 31 pg (ref 26.0–34.0)
MCHC: 33.4 g/dL (ref 30.0–36.0)
MCV: 92.8 fL (ref 80.0–100.0)
Monocytes Absolute: 0.6 10*3/uL (ref 0.1–1.0)
Monocytes Relative: 10 %
Neutro Abs: 2.9 10*3/uL (ref 1.7–7.7)
Neutrophils Relative %: 54 %
Platelet Count: 315 10*3/uL (ref 150–400)
RBC: 4.19 MIL/uL (ref 3.87–5.11)
RDW: 12.1 % (ref 11.5–15.5)
WBC Count: 5.4 10*3/uL (ref 4.0–10.5)
nRBC: 0 % (ref 0.0–0.2)

## 2021-08-30 NOTE — Progress Notes (Signed)
?[place]  ?[phone] [phone]  ? ? ?ID: Jamie [name] DOB: Mar 12, 1956  MR#: [id number]  ZSM#:[id number] ? ?Patient Care Team: ?Emeterio Reeve, DO as PCP - General (Osteopathic Medicine) ?[name], MD as Consulting Physician (General Surgery) ?[name], MD as Consulting Physician (Radiation Oncology) ?[name], MD as Referring Physician ?Armbruster, Carlota Raspberry, MD as Consulting Physician (Gastroenterology) ?Gardenia Phlegm, NP as Nurse Practitioner (Hematology and Oncology) ?Benay Pike, MD as Consulting Physician (Hematology and Oncology) ?OTHER MD: ? 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 ?CURRENT TREATMENT: anastrozole ? ? ?INTERVAL HISTORY: ?Jamie [name] returns today for follow-up of her estrogen receptor positive with HER-2 amplification.  ? ?She says she is almost done with 5 years of antiestrogen therapy.  She has some minor hot flashes but not intolerable. ?She denies any changes in her breast.  She was hoping to follow-up with her PCP now that she is going to be done with 5 years of antiestrogen therapy. ?Rest of the pertinent 10 point ROS reviewed and negative. ? ? ? COVID 19 VACCINATION STATUS: Moderna x2 ? ? ?BREAST CANCER HISTORY: ?From the original intake note: ? ?Jamie [name] had routine bilateral screening mammography with tomography at the [facility] 06/22/2016. This showed a possible mass in the left breast. On 07/02/2016 she underwent left diagnostic mammography with tomography and left breast ultrasonography. The breast density was category C. In the left breast upper outer quadrant there was a microlobulated mass which was not directly palpable, although there was slight thickening in the left breast 1:00 position. Targeted ultrasonography confirmed a solid hypoechoic microlobulated mass in the left breast 1:00 radiant 4 cm from the nipple, measuring 0.8 cm. ? ?On 07/03/2016 [place] underwent biopsy of the left  breast mass in question, and this showed ([id number]) invasive ductal carcinoma, grade 2 or 3, with extracellular mucin, estrogen receptor 5% positive with weak staining intensity, progesterone receptor negative, with an MIB-1 of 50%, and HER-2 amplified, the signals ratio being 5.71 and the number per cell 14.55. ? ?Her subsequent history is as detailed below ? ? ?PAST MEDICAL HISTORY: ?Past Medical History:  ?Diagnosis Date  ? Allergy   ? Anemia   ? Arthritis   ? feet  ? Basal cell carcinoma   ? Breast cancer ([place]) 06/2016  ? left breast  ? Colon polyps   ? Eczema   ? Genetic testing 08/15/2016  ? [name] underwent genetic testing for hereditary cancer syndrome through [facility]'s 43-gene Common Hereditary Cancers Panel. Ms. 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 Result report is dated 08/15/2016. Please see genetic counseling documentation from 08/17/2016 for further discussion.  ? History of radiation therapy 01/02/17-01/31/17  ? left breast was treated to 42.72 Gy in 16 fractions, left breast boost 10 Gy in 5 fractions  ? Personal history of chemotherapy   ? Personal history of radiation therapy   ? PONV (postoperative nausea and vomiting)   ? ? ?PAST SURGICAL HISTORY: ?Past Surgical History:  ?Procedure Laterality Date  ? BONE SPUR Bilateral 2001 AND 1988  ? BREAST BIOPSY    ? BREAST LUMPECTOMY Left   ? 07/2016  ? BREAST LUMPECTOMY WITH RADIOACTIVE SEED AND SENTINEL LYMPH NODE BIOPSY Left 07/26/2016  ? Procedure: BREAST LUMPECTOMY WITH RADIOACTIVE SEED AND SENTINEL LYMPH NODE BIOPSY;  Surgeon: [name], MD;  Location: [place];  Service: General;  Laterality: Left;  ? BUNIONECTOMY Bilateral  02/2012  ? COLONOSCOPY W/ POLYPECTOMY  08/2007  ? DILATION AND CURETTAGE OF UTERUS  08-15-00  ? MOHS SURGERY  2008/08/15  ? PORT-A-CATH REMOVAL N/A 07/18/2017  ? Procedure: REMOVAL PORT-A-CATH;  Surgeon: [name], MD;  Location: [place];   Service: General;  Laterality: N/A;  ? PORTACATH PLACEMENT Right 07/26/2016  ? Procedure: INSERTION PORT-A-CATH;  Surgeon: [name], MD;  Location: [place];  Service: General;  Laterality: Right;  ? ? ?FAMILY HISTORY ?Family History  ?Problem Relation Age of Onset  ? Ovarian cancer Mother 81  ? Hypertension Father   ? Stroke Father   ? Heart disease Father   ? Breast cancer Maternal Aunt 77  ?     recurred at 97  ? Heart disease Paternal Grandmother   ? Osteoporosis Sister   ? Liver cancer Maternal Uncle 08-16-1975  ?The patient's father died at age 39, with some form of skin cancer, most likely melanoma. The patient's mother died at the age of 50 with ovarian cancer, which had been diagnosed a few months prior. The patient had no brothers, 1 sister. A maternal aunt was diagnosed with breast cancer at age 19, recurrent age 43. ? ? ?GYNECOLOGIC HISTORY:  ?No LMP recorded. Patient is postmenopausal. ?Menarche age 62, the patient is GX P0. She stopped having periods approximately age 50. She took birth control pills remotely for 1 or 2 years, with no complications ? ? 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 Her husband Jamie [name] worked as a Dance movement psychotherapist for a [employer]. Their last name is pronounced foh-TEE-ah and they tell me it means "burning" in [place]. Their children are adopted. Jamie [name] lives in [place] and works in Press photographer, and Jamie [name] lives in [place] and is an Scientist, water quality. The patient has 3 grandchildren. She is not a Ambulance person. ?  ? ADVANCED DIRECTIVES: In place ? ? ?HEALTH MAINTENANCE: ?Social History  ? ?Tobacco Use  ? Smoking status: Never  ? Smokeless tobacco: Never  ?Vaping Use  ? Vaping Use: Never used  ?Substance Use Topics  ? Alcohol use: Yes  ?  Comment: 1 drink/week, wine or beer  ? Drug use: No  ? ? ? Colonoscopy:2009 ? WGN:FAOZHY 08/15/16 ? Bone density: 02/26/2017 T-score of -2.0 at the left femur neck ? ?  ?Allergies  ?Allergen Reactions  ? Adhesive [Tape] Rash  ? Codeine  Nausea Only and Other (See Comments)  ?  Dizzy  ? Sulfamethoxazole Nausea And Vomiting  ? ? ?Current Outpatient Medications  ?Medication Sig Dispense Refill  ? acetaminophen (TYLENOL) 650 MG CR tablet Take 1 tablet (650 mg total) by mouth every 8 (eight) hours as needed for pain. 90 tablet 3  ? anastrozole (ARIMIDEX) 1 MG tablet TAKE 1 TABLET DAILY 90 tablet 3  ? BIOTIN PO Take 1 tablet by mouth daily.    ? calcium-vitamin D (OSCAL WITH D) 500-200 MG-UNIT tablet Take 1 tablet by mouth 2 (two) times daily.    ? celecoxib (CELEBREX) 200 MG capsule One to 2 tablets by mouth daily as needed for pain. 60 capsule 2  ? cetirizine (ZYRTEC) 10 MG tablet Take 10 mg by mouth daily as needed (for allergies--Spring/Summer).     ? Cholecalciferol (VITAMIN D3) 1000 units CAPS Take 1,000 Units by mouth daily.     ? Cyanocobalamin (B-12) 2500 MCG TABS Place 2,500 mcg under the tongue daily.     ? diphenhydrAMINE (BENADRYL) 25 MG tablet Take 12.5 mg by mouth at  bedtime as needed for sleep.    ? gentamicin cream (GARAMYCIN) 0.1 % Apply 1 application topically 3 (three) times daily. 15 g 0  ? Multiple Vitamin (MULTIVITAMIN) tablet Take 1 tablet by mouth daily.    ? Red Yeast Rice Extract (RED YEAST RICE PO) Take 1 tablet by mouth 3 (three) times a week.    ? urea (GORDONS UREA) 40 % ointment Apply topically as needed. 30 g 0  ? ?No current facility-administered medications for this visit.  ? ? ?OBJECTIVE: White woman who appears stated ?Vitals:  ? 08/30/21 1137  ?BP: 109/72  ?Pulse: 66  ?Resp: 16  ?Temp: 97.7 ?F (36.5 ?C)  ?SpO2: 100%  ? ?  ?Body mass index is 25.92 kg/m?.   ?Filed Weights  ? 08/30/21 1137  ?Weight: 151 lb (68.5 kg)  ? ? ECOG FS:1 ? ?Physical Exam ?Constitutional:   ?   Appearance: Normal appearance.  ?HENT:  ?   Head: Normocephalic and atraumatic.  ?Cardiovascular:  ?   Rate and Rhythm: Normal rate and regular rhythm.  ?Pulmonary:  ?   Effort: Pulmonary effort is normal.  ?   Breath sounds: Normal breath sounds.   ?Chest:  ?   Comments: Bilateral breasts inspected.  No palpable masses or regional lymphadenopathy ?Abdominal:  ?   General: Abdomen is flat. Bowel sounds are normal.  ?   Palpations: Abdomen is soft.

## 2021-08-30 NOTE — Progress Notes (Signed)
Lab orders

## 2021-09-06 ENCOUNTER — Ambulatory Visit: Payer: Medicare Other | Admitting: Hematology and Oncology

## 2021-09-06 ENCOUNTER — Other Ambulatory Visit: Payer: Medicare Other

## 2021-10-02 ENCOUNTER — Ambulatory Visit: Payer: Medicare Other | Admitting: Podiatry

## 2021-10-13 ENCOUNTER — Ambulatory Visit (INDEPENDENT_AMBULATORY_CARE_PROVIDER_SITE_OTHER): Payer: Medicare Other | Admitting: Family Medicine

## 2021-10-13 ENCOUNTER — Encounter: Payer: Self-pay | Admitting: Family Medicine

## 2021-10-13 VITALS — BP 110/70 | HR 72 | Temp 98.1°F | Ht 66.0 in | Wt 151.2 lb

## 2021-10-13 DIAGNOSIS — Z8249 Family history of ischemic heart disease and other diseases of the circulatory system: Secondary | ICD-10-CM | POA: Diagnosis not present

## 2021-10-13 DIAGNOSIS — Z170 Estrogen receptor positive status [ER+]: Secondary | ICD-10-CM

## 2021-10-13 DIAGNOSIS — E7800 Pure hypercholesterolemia, unspecified: Secondary | ICD-10-CM | POA: Diagnosis not present

## 2021-10-13 DIAGNOSIS — E162 Hypoglycemia, unspecified: Secondary | ICD-10-CM

## 2021-10-13 DIAGNOSIS — E038 Other specified hypothyroidism: Secondary | ICD-10-CM

## 2021-10-13 DIAGNOSIS — C50412 Malignant neoplasm of upper-outer quadrant of left female breast: Secondary | ICD-10-CM

## 2021-10-13 DIAGNOSIS — M8589 Other specified disorders of bone density and structure, multiple sites: Secondary | ICD-10-CM | POA: Diagnosis not present

## 2021-10-13 NOTE — Progress Notes (Signed)
New Patient Office Visit  Subjective:  Patient ID: Jamie [name], female    DOB: [birth year]  Age: 66 y.o. MRN: [id number]  CC:  Chief Complaint  Patient presents with   Establish Care    Need new PCP Not fasting     HPI Jamie [name] presents for new pt-changing providers(doc left).  Prefers female docs.  HLD-no meds.  RYR didn't help-took 3x/wk.  Never took rx meds.  TLC.  Dad CVA in 79's.CAD in 37's.maunt cad 60.  More on both sides as well.  L Breast ca-managed by onc. Dx 2018. On arimidex till August and then will f/u w/PCP as not want to continue at cancer ctr..  Had surg, chemo, rad.  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 Cscope 2018-due end of this yr.    Osteopenia-stable.  Exercises.  Taking calc/D-due for dxa in August.     Past Medical History:  Diagnosis Date   Allergy    Anemia    Anxiety    Occasionally   Arthritis    feet   Basal cell carcinoma    Breast cancer ([place]) 06/2016   left breast   Colon polyps    Eczema    Genetic testing 08/15/2016   Ms. Armenti underwent genetic testing for hereditary cancer syndrome through [facility]'s 43-gene Common Hereditary Cancers Panel. Ms. 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 Result report is dated 08/15/2016. Please see genetic counseling documentation from 08/17/2016 for further discussion.   History of radiation therapy 01/02/17-01/31/17   left breast was treated to 42.72 Gy in 16 fractions, left breast boost 10 Gy in 5 fractions   Hyperlipidemia    Personal history of chemotherapy    Personal history of radiation therapy    PONV (postoperative nausea and vomiting)     Past Surgical History:  Procedure Laterality Date   BONE SPUR Bilateral 2001 AND 1988   BREAST BIOPSY     BREAST LUMPECTOMY Left    07/2016   BREAST LUMPECTOMY WITH RADIOACTIVE SEED AND SENTINEL LYMPH NODE BIOPSY Left 07/26/2016   Procedure: BREAST LUMPECTOMY WITH  RADIOACTIVE SEED AND SENTINEL LYMPH NODE BIOPSY;  Surgeon: [name], MD;  Location: [place];  Service: General;  Laterality: Left;   BUNIONECTOMY Bilateral 02/2012   COLONOSCOPY W/ POLYPECTOMY  08/2007   DILATION AND CURETTAGE OF UTERUS  2002   MOHS SURGERY  2010   PORT-A-CATH REMOVAL N/A 07/18/2017   Procedure: REMOVAL PORT-A-CATH;  Surgeon: [name], MD;  Location: [place];  Service: General;  Laterality: N/A;   PORTACATH PLACEMENT Right 07/26/2016   Procedure: INSERTION PORT-A-CATH;  Surgeon: [name], MD;  Location: [place];  Service: General;  Laterality: Right;    Family History  Problem Relation Age of Onset   Hyperlipidemia Mother    Ovarian cancer Mother 82   Asthma Mother    Cancer Mother    Hyperlipidemia Father    Cancer Father    Hypertension Father    Stroke Father    Heart disease Father    Depression Father    Heart attack Father    Hyperlipidemia Sister    Arthritis Sister    Osteoporosis Sister    Breast cancer Maternal Aunt 77       recurred at 61   Liver cancer Maternal Uncle 77   Heart attack Maternal Grandmother    Heart attack Paternal Grandmother  Heart disease Paternal Grandmother     Social History   Socioeconomic History   Marital status: Married    Spouse name: Not on file   Number of children: 2   Years of education: Not on file   Highest education level: Not on file  Occupational History   Occupation: retired  Tobacco Use   Smoking status: Never    Passive exposure: Never   Smokeless tobacco: Never  Vaping Use   Vaping Use: Never used  Substance and Sexual Activity   Alcohol use: Yes    Alcohol/week: 4.0 standard drinks    Types: 4 Cans of beer per week    Comment: 1 drink/week, wine or beer   Drug use: No   Sexual activity: Yes    Birth control/protection: Post-menopausal  Other Topics Concern   Not on file  Social History Narrative   2 adopted children.  4 grands   Retired  Field seismologist   Social Determinants of Radio broadcast assistant Strain: Not on file  Food Insecurity: Not on file  Transportation Needs: Not on file  Physical Activity: Not on file  Stress: Not on file  Social Connections: Not on file  Intimate Partner Violence: Not on file    ROS  ROS: Gen: no fever, chills  Skin: no rash, itching ENT: no ear pain, ear drainage, nasal congestion, rhinorrhea, sinus pressure, sore throat Eyes: no blurry vision, double vision Resp: no cough, wheeze,SOB CV: no CP, palpitations, LE edema,  GI: no heartburn, n/v/d/c, abd pain.  Eats more gluten free since some GI issues-helps. Metamucil helps w/constipation(long h/o).  GU: no dysuria, urgency, frequency, hematuria MSK: no joint pain, myalgias, back pain Neuro: no dizziness, headache, weakness, vertigo Psych: no depression, anxiety, insomnia, SI   Objective:   Today's Vitals: BP 110/70   Pulse 72   Temp 98.1 F (36.7 C) (Temporal)   Ht _0  (1.676 m)   Wt 151 lb 4 oz (68.6 kg)   SpO2 99%   BMI 24.41 kg/m   Physical Exam  Gen: WDWN NAD wf HEENT: NCAT, conjunctiva not injected, sclera nonicteric TM WNL B, OP moist, no exudates  NECK:  supple, no thyromegaly, no nodes, no carotid bruits CARDIAC: RRR, S1S2+, no murmur. DP 2+B LUNGS: CTAB. No wheezes ABDOMEN:  BS+, soft, NTND, No HSM, no masses EXT:  no edema MSK: no gross abnormalities.  NEURO: A&O x3.  CN II-XII intact.  PSYCH: normal mood. Good eye contact   Reviewed labs from April  Assessment & Plan:   Problem List Items Addressed This Visit       Endocrine   Subclinical hypothyroidism     Musculoskeletal and Integument   Osteopenia     Other   Hyperlipemia - Primary   Malignant neoplasm of upper-outer quadrant of left breast in female, estrogen receptor positive ([place])   Family history of coronary arteriosclerosis   Other Visit Diagnoses     Hypoglycemia          Hyperlipidemia-+FH CAD-discussed  risks/prevention/pros/cons of statins.  Already doing TLC.  Would rec taking crestor.  Will check lipids to see if any improvement Subclinical hypothyroidism-check Tsh,Ft4,Ft3 FH CAD-discussed statin therapy-await lipids Breast Ca-pt had lumpectomy, chemo,rad and finishing arimidex.  Will f/u w/me rather than onc(pt preference d/t memories of being under tx).  Will call when due mamm Hypoglycemia-check A1C to r/o dm.  Eat small meals freq. Osteopenia.  Working on [employer].  Taking Ca/D.  Check vit D.  Due dxa in august.  Outpatient Encounter Medications as of 10/13/2021  Medication Sig   anastrozole (ARIMIDEX) 1 MG tablet TAKE 1 TABLET DAILY   BIOTIN PO Take 1 tablet by mouth daily.   calcium-vitamin D (OSCAL WITH D) 500-200 MG-UNIT tablet Take 1 tablet by mouth 2 (two) times daily.   cetirizine (ZYRTEC) 10 MG tablet Take 10 mg by mouth daily as needed (for allergies--Spring/Summer).    Cholecalciferol (VITAMIN D3) 1000 units CAPS Take 1,000 Units by mouth daily.    Cyanocobalamin (B-12) 2500 MCG TABS Place 2,500 mcg under the tongue daily.    Multiple Vitamin (MULTIVITAMIN) tablet Take 1 tablet by mouth daily.   Psyllium (METAMUCIL) 28.3 % POWD    urea (GORDONS UREA) 40 % ointment Apply topically as needed.   [DISCONTINUED] acetaminophen (TYLENOL) 650 MG CR tablet Take 1 tablet (650 mg total) by mouth every 8 (eight) hours as needed for pain.   [DISCONTINUED] celecoxib (CELEBREX) 200 MG capsule One to 2 tablets by mouth daily as needed for pain.   [DISCONTINUED] diphenhydrAMINE (BENADRYL) 25 MG tablet Take 12.5 mg by mouth at bedtime as needed for sleep.   [DISCONTINUED] gentamicin cream (GARAMYCIN) 0.1 % Apply 1 application topically 3 (three) times daily.   [DISCONTINUED] Red Yeast Rice Extract (RED YEAST RICE PO) Take 1 tablet by mouth 3 (three) times a week.   No facility-administered encounter medications on file as of 10/13/2021.    Follow-up: Return for soon fasting labs.      annual in 1  yr.   Wellington Hampshire, MD

## 2021-10-13 NOTE — Patient Instructions (Signed)
Welcome to [facility] at [facility]! It was a pleasure meeting you today. ° °As discussed, Please schedule a 12 month follow up visit today. ° °PLEASE NOTE: ° °If you had any LAB tests please let us know if you have not heard back within a few days. You may see your results on MyChart before we have a chance to review them but we will give you a call once they are reviewed by us. If we ordered any REFERRALS today, please let us know if you have not heard from their office within the next week.  °Let us know through MyChart if you are needing REFILLS, or have your pharmacy send us the request. You can also use MyChart to communicate with me or any office staff. ° °Please try these tips to maintain a healthy lifestyle: ° °Eat most of your calories during the day when you are active. Eliminate processed foods including packaged sweets (pies, cakes, cookies), reduce intake of potatoes, white bread, white pasta, and white rice. Look for whole grain options, oat flour or almond flour. ° °Each meal should contain half fruits/vegetables, one quarter protein, and one quarter carbs (no bigger than a computer mouse). ° °Cut down on sweet beverages. This includes juice, soda, and sweet tea. Also watch fruit intake, though this is a healthier sweet option, it still contains natural sugar! Limit to 3 servings daily. ° °Drink at least 1 glass of water with each meal and aim for at least 8 glasses per day ° °Exercise at least 150 minutes every week.   °

## 2021-10-17 ENCOUNTER — Other Ambulatory Visit (INDEPENDENT_AMBULATORY_CARE_PROVIDER_SITE_OTHER): Payer: Medicare Other

## 2021-10-17 DIAGNOSIS — E162 Hypoglycemia, unspecified: Secondary | ICD-10-CM | POA: Diagnosis not present

## 2021-10-17 DIAGNOSIS — M8589 Other specified disorders of bone density and structure, multiple sites: Secondary | ICD-10-CM

## 2021-10-17 DIAGNOSIS — E7800 Pure hypercholesterolemia, unspecified: Secondary | ICD-10-CM

## 2021-10-17 DIAGNOSIS — E038 Other specified hypothyroidism: Secondary | ICD-10-CM

## 2021-10-17 LAB — LIPID PANEL
Cholesterol: 229 mg/dL — ABNORMAL HIGH (ref 0–200)
HDL: 60.4 mg/dL (ref >39.00)
LDL Cholesterol: 130 mg/dL — ABNORMAL HIGH (ref 0–99)
NonHDL: 168.67
Total CHOL/HDL Ratio: 4
Triglycerides: 192 mg/dL — ABNORMAL HIGH (ref 0.0–149.0)
VLDL: 38.4 mg/dL (ref 0.0–40.0)

## 2021-10-17 LAB — T4, FREE: Free T4: 0.69 ng/dL (ref 0.60–1.60)

## 2021-10-17 LAB — T3, FREE: T3, Free: 2.7 pg/mL (ref 2.3–4.2)

## 2021-10-17 LAB — HEMOGLOBIN A1C: Hgb A1c MFr Bld: 5.7 % (ref 4.6–6.5)

## 2021-10-17 LAB — TSH: TSH: 5.29 u[IU]/mL (ref 0.35–5.50)

## 2021-10-17 LAB — VITAMIN D 25 HYDROXY (VIT D DEFICIENCY, FRACTURES): VITD: 40.35 ng/mL (ref 30.00–100.00)

## 2021-10-30 ENCOUNTER — Ambulatory Visit (INDEPENDENT_AMBULATORY_CARE_PROVIDER_SITE_OTHER): Payer: Medicare Other

## 2021-10-30 DIAGNOSIS — Z0000 Encounter for general adult medical examination without abnormal findings: Secondary | ICD-10-CM | POA: Diagnosis not present

## 2021-10-30 NOTE — Patient Instructions (Signed)
Jamie [name] , Thank you for taking time to come for your Medicare Wellness Visit. I appreciate your ongoing commitment to your health goals. Please review the following plan we discussed and let me know if I can assist you in the future.   Screening recommendations/referrals: Colonoscopy: Completed 04/17/17 repeat every 10 years  Mammogram: Done 08/01/21 repeat every year Bone Density: Done 01/07/20 repeat every 2 years scheduled 04/05/22 Recommended yearly ophthalmology/optometry visit for glaucoma screening and checkup Recommended yearly dental visit for hygiene and checkup  Vaccinations: Influenza vaccine: completed 9/22 per pt  Pneumococcal vaccine: Up to date Tdap vaccine: Done 03/23/21 repeat every 10 years  Shingles vaccine: completed 05/01/18 & 07/11/18   Covid-19:completed 1/25, 2/22 & 03/14/20& 07/27/21  Advanced directives: Please bring a copy of your health care power of attorney and living will to the office at your convenience.  Conditions/risks identified: keep active and keep weight stable   Next appointment: Follow up in one year for your annual wellness visit    Preventive Care 65 Years and Older, Female Preventive care refers to lifestyle choices and visits with your health care provider that can promote health and wellness. What does preventive care include? A yearly physical exam. This is also called an annual well check. Dental exams once or twice a year. Routine eye exams. Ask your health care provider how often you should have your eyes checked. Personal lifestyle choices, including: Daily care of your teeth and gums. Regular physical activity. Eating a healthy diet. Avoiding tobacco and drug use. Limiting alcohol use. Practicing safe sex. Taking low-dose aspirin every day. Taking vitamin and mineral supplements as recommended by your health care provider. What happens during an annual well check? The services and screenings done by your health care provider  during your annual well check will depend on your age, overall health, lifestyle risk factors, and family history of disease. Counseling  Your health care provider may ask you questions about your: Alcohol use. Tobacco use. Drug use. Emotional well-being. Home and relationship well-being. Sexual activity. Eating habits. History of falls. Memory and ability to understand (cognition). Work and work Statistician. Reproductive health. Screening  You may have the following tests or measurements: Height, weight, and BMI. Blood pressure. Lipid and cholesterol levels. These may be checked every 5 years, or more frequently if you are over 12 years old. Skin check. Lung cancer screening. You may have this screening every year starting at age 31 if you have a 30-pack-year history of smoking and currently smoke or have quit within the past 15 years. Fecal occult blood test (FOBT) of the stool. You may have this test every year starting at age 69. Flexible sigmoidoscopy or colonoscopy. You may have a sigmoidoscopy every 5 years or a colonoscopy every 10 years starting at age 62. Hepatitis C blood test. Hepatitis B blood test. Sexually transmitted disease (STD) testing. Diabetes screening. This is done by checking your blood sugar (glucose) after you have not eaten for a while (fasting). You may have this done every 1-3 years. Bone density scan. This is done to screen for osteoporosis. You may have this done starting at age 92. Mammogram. This may be done every 1-2 years. Talk to your health care provider about how often you should have regular mammograms. Talk with your health care provider about your test results, treatment options, and if necessary, the need for more tests. Vaccines  Your health care provider may recommend certain vaccines, such as: Influenza vaccine. This is recommended every  year. Tetanus, diphtheria, and acellular pertussis (Tdap, Td) vaccine. You may need a Td booster every  10 years. Zoster vaccine. You may need this after age 45. Pneumococcal 13-valent conjugate (PCV13) vaccine. One dose is recommended after age 95. Pneumococcal polysaccharide (PPSV23) vaccine. One dose is recommended after age 26. Talk to your health care provider about which screenings and vaccines you need and how often you need them. This information is not intended to replace advice given to you by your health care provider. Make sure you discuss any questions you have with your health care provider. Document Released: 06/03/2015 Document Revised: 01/25/2016 Document Reviewed: 03/08/2015 Elsevier Interactive Patient Education  2017 [place] Prevention in the Home Falls can cause injuries. They can happen to people of all ages. There are many things you can do to make your home safe and to help prevent falls. What can I do on the outside of my home? Regularly fix the edges of walkways and driveways and fix any cracks. Remove anything that might make you trip as you walk through a door, such as a raised step or threshold. Trim any bushes or trees on the path to your home. Use bright outdoor lighting. Clear any walking paths of anything that might make someone trip, such as rocks or tools. Regularly check to see if handrails are loose or broken. Make sure that both sides of any steps have handrails. Any raised decks and porches should have guardrails on the edges. Have any leaves, snow, or ice cleared regularly. Use sand or salt on walking paths during winter. Clean up any spills in your garage right away. This includes oil or grease spills. What can I do in the bathroom? Use night lights. Install grab bars by the toilet and in the tub and shower. Do not use towel bars as grab bars. Use non-skid mats or decals in the tub or shower. If you need to sit down in the shower, use a plastic, non-slip stool. Keep the floor dry. Clean up any water that spills on the floor as soon as it  happens. Remove soap buildup in the tub or shower regularly. Attach bath mats securely with double-sided non-slip rug tape. Do not have throw rugs and other things on the floor that can make you trip. What can I do in the bedroom? Use night lights. Make sure that you have a light by your bed that is easy to reach. Do not use any sheets or blankets that are too big for your bed. They should not hang down onto the floor. Have a firm chair that has side arms. You can use this for support while you get dressed. Do not have throw rugs and other things on the floor that can make you trip. What can I do in the kitchen? Clean up any spills right away. Avoid walking on wet floors. Keep items that you use a lot in easy-to-reach places. If you need to reach something above you, use a strong step stool that has a grab bar. Keep electrical cords out of the way. Do not use floor polish or wax that makes floors slippery. If you must use wax, use non-skid floor wax. Do not have throw rugs and other things on the floor that can make you trip. What can I do with my stairs? Do not leave any items on the stairs. Make sure that there are handrails on both sides of the stairs and use them. Fix handrails that are broken or  loose. Make sure that handrails are as long as the stairways. Check any carpeting to make sure that it is firmly attached to the stairs. Fix any carpet that is loose or worn. Avoid having throw rugs at the top or bottom of the stairs. If you do have throw rugs, attach them to the floor with carpet tape. Make sure that you have a light switch at the top of the stairs and the bottom of the stairs. If you do not have them, ask someone to add them for you. What else can I do to help prevent falls? Wear shoes that: Do not have high heels. Have rubber bottoms. Are comfortable and fit you well. Are closed at the toe. Do not wear sandals. If you use a stepladder: Make sure that it is fully opened.  Do not climb a closed stepladder. Make sure that both sides of the stepladder are locked into place. Ask someone to hold it for you, if possible. Clearly mark and make sure that you can see: Any grab bars or handrails. First and last steps. Where the edge of each step is. Use tools that help you move around (mobility aids) if they are needed. These include: Canes. Walkers. Scooters. Crutches. Turn on the lights when you go into a dark area. Replace any light bulbs as soon as they burn out. Set up your furniture so you have a clear path. Avoid moving your furniture around. If any of your floors are uneven, fix them. If there are any pets around you, be aware of where they are. Review your medicines with your doctor. Some medicines can make you feel dizzy. This can increase your chance of falling. Ask your doctor what other things that you can do to help prevent falls. This information is not intended to replace advice given to you by your health care provider. Make sure you discuss any questions you have with your health care provider. Document Released: 03/03/2009 Document Revised: 10/13/2015 Document Reviewed: 06/11/2014 Elsevier Interactive Patient Education  2017 [employer].

## 2021-10-30 NOTE — Progress Notes (Addendum)
Virtual Visit via Telephone Note  I connected with  Jamie [name] on 10/30/21 at  1:00 PM EDT by telephone and verified that I am speaking with the correct person using two identifiers.  Medicare Annual Wellness visit completed telephonically due to Covid-19 pandemic.   Persons participating in this call: This Health Coach and this patient.   Location: Patient: Home Provider: Office   I discussed the limitations, risks, security and privacy concerns of performing an evaluation and management service by telephone and the availability of in person appointments. The patient expressed understanding and agreed to proceed.  Unable to perform video visit due to video visit attempted and failed and/or patient does not have video capability.   Some vital signs may be absent or patient reported.   Willette Brace, LPN   Subjective:   Jamie [name] is a 66 y.o. female who presents for an Initial Medicare Annual Wellness Visit.  Review of Systems     Cardiac Risk Factors include: advanced age (>70mn, >>32women);dyslipidemia     Objective:    There were no vitals filed for this visit. There is no height or weight on file to calculate BMI.     10/30/2021    1:16 PM 08/29/2021    3:38 PM 04/24/2021    1:45 PM 02/19/2020   10:21 AM 08/26/2017   11:58 AM 07/31/2017    9:38 AM 07/15/2017    2:56 PM  Advanced Directives  Does Patient Have a Medical Advance Directive? Yes Yes Yes Yes Yes Yes Yes  Type of [place] will Living will;Healthcare Power of [place] Bend[place] will [place] will  Does patient want to make changes to medical advance directive?    No - Patient declined     Copy of [place] Chart? No - copy requested   No - copy requested No - copy requested No - copy requested No - copy requested    Current  Medications (verified) Outpatient Encounter Medications as of 10/30/2021  Medication Sig   anastrozole (ARIMIDEX) 1 MG tablet TAKE 1 TABLET DAILY   BIOTIN PO Take 1 tablet by mouth daily.   calcium-vitamin D (OSCAL WITH D) 500-200 MG-UNIT tablet Take 1 tablet by mouth 2 (two) times daily.   cetirizine (ZYRTEC) 10 MG tablet Take 10 mg by mouth daily as needed (for allergies--Spring/Summer).    Cholecalciferol (VITAMIN D3) 1000 units CAPS Take 1,000 Units by mouth daily.    Cyanocobalamin (B-12) 2500 MCG TABS Place 2,500 mcg under the tongue daily.    Multiple Vitamin (MULTIVITAMIN) tablet Take 1 tablet by mouth daily.   Omega-3 Fatty Acids (OMEGA-3 FISH OIL PO) Take by mouth.   Psyllium (METAMUCIL) 28.3 % POWD    Red Yeast Rice Extract (RED YEAST RICE PO) Take by mouth.   urea (GORDONS UREA) 40 % ointment Apply topically as needed.   No facility-administered encounter medications on file as of 10/30/2021.    Allergies (verified) Adhesive [tape], Codeine, and Sulfamethoxazole   History: Past Medical History:  Diagnosis Date   Allergy    Anemia    Anxiety    Occasionally   Arthritis    feet   Basal cell carcinoma    [place]   [place]   Colon polyps    Eczema    Genetic testing 08/15/2016   Ms. Bossi underwent  genetic testing for hereditary cancer syndrome through [facility]'s 43-gene Common Hereditary Cancers Panel. Ms. 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 Result report is dated 08/15/2016. Please see genetic counseling documentation from 08/17/2016 for further discussion.   History of radiation therapy 01/02/17-01/31/17   left breast was treated to 42.72 Gy in 16 fractions, left breast boost 10 Gy in 5 fractions   Hyperlipidemia    Personal history of chemotherapy    Personal history of radiation therapy    PONV (postoperative nausea and vomiting)    Past Surgical History:   Procedure Laterality Date   BONE SPUR Bilateral 2001 AND 1988   BREAST BIOPSY     BREAST LUMPECTOMY Left    07/2016   BREAST LUMPECTOMY WITH RADIOACTIVE SEED AND SENTINEL LYMPH NODE BIOPSY Left 07/26/2016   Procedure: BREAST LUMPECTOMY WITH RADIOACTIVE SEED AND SENTINEL LYMPH NODE BIOPSY;  Surgeon: [name], MD;  Location: [facility];  Service: General;  Laterality: Left;   BUNIONECTOMY Bilateral 02/2012   COLONOSCOPY W/ POLYPECTOMY  08/2007   DILATION AND CURETTAGE OF UTERUS  2002   MOHS SURGERY  2010   PORT-A-CATH REMOVAL N/A 07/18/2017   Procedure: REMOVAL PORT-A-CATH;  Surgeon: [name], MD;  Location: [facility];  Service: General;  Laterality: N/A;   PORTACATH PLACEMENT Right 07/26/2016   Procedure: INSERTION PORT-A-CATH;  Surgeon: [name], MD;  Location: [facility];  Service: General;  Laterality: Right;   Family History  Problem Relation Age of Onset   Hyperlipidemia Mother    Ovarian cancer Mother 59   Asthma Mother    Cancer Mother    Hyperlipidemia Father    Cancer Father    Hypertension Father    Stroke Father    Heart disease Father    Depression Father    Heart attack Father    Hyperlipidemia Sister    Arthritis Sister    Osteoporosis Sister    Breast cancer Maternal Aunt 77       recurred at 71   Liver cancer Maternal Uncle 77   Heart attack Maternal Grandmother    Heart attack Paternal Grandmother    Heart disease Paternal Grandmother    Social History   Socioeconomic History   Marital status: Married    Spouse name: Not on file   Number of children: 2   Years of education: Not on file   Highest education level: Not on file  Occupational History   Occupation: retired  Tobacco Use   Smoking status: Never    Passive exposure: Never   Smokeless tobacco: Never  Vaping Use   Vaping Use: Never used  Substance and Sexual Activity   Alcohol use: Yes    Alcohol/week: 4.0 standard drinks of alcohol    Types: 4 Cans of  beer per week    Comment: 1 drink/week, wine or beer   Drug use: No   Sexual activity: Yes    Birth control/protection: Post-menopausal  Other Topics Concern   Not on file  Social History Narrative   2 adopted children.  4 grands   Retired Field seismologist   Social Determinants of [place] Strain: Low Risk  (10/30/2021)   Overall Financial Resource Strain (CARDIA)    Difficulty of Paying Living Expenses: Not hard at all  Food Insecurity: No Food Insecurity (10/30/2021)   Hunger Vital Sign    Worried About Running Out of  Food in the Last Year: Never true    [place] in the Last Year: Never true  Transportation Needs: No Transportation Needs (10/30/2021)   PRAPARE - Hydrologist (Medical): No    Lack of Transportation (Non-Medical): No  Physical Activity: Sufficiently Active (10/30/2021)   Exercise Vital Sign    Days of Exercise per Week: 5 days    Minutes of Exercise per Session: 90 min  Stress: No Stress Concern Present (10/30/2021)   [place]    Feeling of Stress : Not at all  Social Connections: Moderately Integrated (10/30/2021)   Social Connection and Isolation Panel [NHANES]    Frequency of Communication with Friends and Family: Twice a week    Frequency of Social Gatherings with Friends and Family: More than three times a week    Attends Religious Services: More than 4 times per year    Active Member of [employer] or Organizations: No    Attends Music therapist: Not on file    Marital Status: Married    Tobacco Counseling Counseling given: Not Answered   Clinical Intake:  Pre-visit preparation completed: Yes  Pain : No/denies pain     BMI - recorded: 24.42 Nutritional Status: BMI of 19-24  Normal Nutritional Risks: None Diabetes: No  How often do you need to have someone help you when you read instructions, pamphlets, or  other written materials from your doctor or pharmacy?: 1 - Never  Diabetic?no  Interpreter Needed?: No  Information entered by :: Charlott Rakes, LPN   Activities of Daily Living    10/30/2021    1:19 PM  In your present state of health, do you have any difficulty performing the following activities:  Hearing? 0  Vision? 0  Difficulty concentrating or making decisions? 0  Walking or climbing stairs? 0  Dressing or bathing? 0  Doing errands, shopping? 0  Preparing Food and eating ? N  Using the Toilet? N  In the past six months, have you accidently leaked urine? N  Do you have problems with loss of bowel control? N  Managing your Medications? N  Managing your Finances? N  Housekeeping or managing your Housekeeping? N    Patient Care Team: Tawnya Crook, MD as PCP - General (Family Medicine) [name], MD as Consulting Physician (General Surgery) [name], MD as Consulting Physician (Radiation Oncology) [name], MD as Referring Physician Armbruster, Carlota Raspberry, MD as Consulting Physician (Gastroenterology) Delice Bison Charlestine Massed, NP as Nurse Practitioner (Hematology and Oncology) Benay Pike, MD as Consulting Physician (Hematology and Oncology)  Indicate any recent Medical Services you may have received from other than [facility] providers in the past year (date may be approximate).     Assessment:   This is a routine wellness examination for Simya.  Hearing/Vision screen Hearing Screening - Comments:: Pt denies any hearing issues  Vision Screening - Comments:: Pt follows up with Dr Nicki Reaper for annual eye exams   Dietary issues and exercise activities discussed: Current Exercise Habits: Home exercise routine, Type of exercise: yoga;strength training/weights;Other - see comments;walking, Time (Minutes): > 60, Frequency (Times/Week): 5, Weekly Exercise (Minutes/Week): 0   Goals Addressed             This Visit's Progress    Patient Stated        Keep active and keep weight stable        Depression Screen  10/30/2021    1:14 PM 10/13/2021   10:29 AM 09/17/2019    7:44 AM 09/08/2019    1:21 PM 05/08/2018   10:06 AM 04/03/2018   10:43 AM 08/16/2017    9:10 AM  PHQ 2/9 Scores  PHQ - 2 Score 0 0 0 0 0 0 1  PHQ- 9 Score  0 '1 1 2      '$ Fall Risk    10/30/2021    1:18 PM 10/13/2021   10:29 AM 09/08/2019    1:21 PM 08/16/2017    9:10 AM 03/11/2017   11:01 AM  Fall Risk   Falls in the past year? 0 0 0 No No  Number falls in past yr: 0 0     Injury with Fall? 0 0     Risk for fall due to : Impaired vision No Fall Risks     Follow up Falls prevention discussed Falls evaluation completed Falls evaluation completed      FALL RISK PREVENTION PERTAINING TO THE HOME:  Any stairs in or around the home? Yes  If so, are there any without handrails? No  Home free of loose throw rugs in walkways, pet beds, electrical cords, etc? Yes  Adequate lighting in your home to reduce risk of falls? Yes   ASSISTIVE DEVICES UTILIZED TO PREVENT FALLS:  Life alert? Apple watch  Use of a cane, walker or w/c? No  Grab bars in the bathroom? Yes  Shower chair or bench in shower? Yes  Elevated toilet seat or a handicapped toilet? No   TIMED UP AND GO:  Was the test performed? No .  Cognitive Function:        10/30/2021    1:20 PM  6CIT Screen  What Year? 0 points  What month? 0 points  What time? 0 points  Count back from 20 0 points  Months in reverse 0 points  Repeat phrase 0 points  Total Score 0 points    Immunizations Immunization History  Administered Date(s) Administered   Fluad Quad(high Dose 65+) 02/04/2020   Influenza,inj,Quad PF,6+ Mos 02/18/2014, 02/13/2017, 01/14/2018, 02/10/2019   Influenza-Unspecified 03/24/2009, 03/08/2015   Moderna Sars-Covid-2 Vaccination 06/15/2019, 07/13/2019, 03/14/2020   PNEUMOCOCCAL CONJUGATE-20 09/19/2020   Pfizer Covid-19 Vaccine Bivalent Booster 60yr & up 07/27/2021   Td 05/17/1999    Tdap 10/30/2010, 03/23/2021   Zoster Recombinat (Shingrix) 05/01/2018, 07/11/2018   Zoster, Live 02/15/2015    TDAP status: Up to date  Flu Vaccine status: Up to date  Pneumococcal vaccine status: Up to date  Covid-19 vaccine status: Completed vaccines  Qualifies for Shingles Vaccine? Yes   Zostavax completed Yes   Shingrix Completed?: Yes  Screening Tests Health Maintenance  Topic Date Due   INFLUENZA VACCINE  12/19/2021   MAMMOGRAM  08/02/2023   COLONOSCOPY (Pts 45-434yrInsurance coverage will need to be confirmed)  04/18/2027   TETANUS/TDAP  03/24/2031   Pneumonia Vaccine 6545Years old  Completed   DEXA SCAN  Completed   COVID-19 Vaccine  Completed   Hepatitis C Screening  Completed   Zoster Vaccines- Shingrix  Completed   HPV VACCINES  Aged Out    Health Maintenance  There are no preventive care reminders to display for this patient.  Colorectal cancer screening: Type of screening: Colonoscopy. Completed 04/17/17. Repeat every 10 years  Mammogram status: Completed 08/01/21. Repeat every year  Bone Density status: Completed 01/07/20. Results reflect: Bone density results: OSTEOPENIA. Repeat every 2 years.   Additional Screening:  Hepatitis  C Screening: Completed 08/04/15  Vision Screening: Recommended annual ophthalmology exams for early detection of glaucoma and other disorders of the eye. Is the patient up to date with their annual eye exam?  Yes  Who is the provider or what is the name of the office in which the patient attends annual eye exams? Dr Nicki Reaper If pt is not established with a provider, would they like to be referred to a provider to establish care? No .   Dental Screening: Recommended annual dental exams for proper oral hygiene  Community Resource Referral / Chronic Care Management: CRR required this visit?  No   CCM required this visit?  No      Plan:     I have personally reviewed and noted the following in the patient's chart:    Medical and social history Use of alcohol, tobacco or illicit drugs  Current medications and supplements including opioid prescriptions. Patient is not currently taking opioid prescriptions. Functional ability and status Nutritional status Physical activity Advanced directives List of other physicians Hospitalizations, surgeries, and ER visits in previous 12 months Vitals Screenings to include cognitive, depression, and falls Referrals and appointments  In addition, I have reviewed and discussed with patient certain preventive protocols, quality metrics, and best practice recommendations. A written personalized care plan for preventive services as well as general preventive health recommendations were provided to patient.     Willette Brace, LPN   [id number]   Nurse Notes: None

## 2021-11-28 ENCOUNTER — Encounter: Payer: Self-pay | Admitting: Family Medicine

## 2022-02-12 ENCOUNTER — Encounter: Payer: Self-pay | Admitting: *Deleted

## 2022-04-04 ENCOUNTER — Encounter: Payer: Self-pay | Admitting: Gastroenterology

## 2022-04-05 ENCOUNTER — Ambulatory Visit
Admission: RE | Admit: 2022-04-05 | Discharge: 2022-04-05 | Disposition: A | Discharge status: Home / Self Care | Payer: Medicare Other | Source: Ambulatory Visit | Attending: Family Medicine | Admitting: Family Medicine

## 2022-04-05 DIAGNOSIS — M8589 Other specified disorders of bone density and structure, multiple sites: Secondary | ICD-10-CM

## 2022-04-17 ENCOUNTER — Ambulatory Visit (INDEPENDENT_AMBULATORY_CARE_PROVIDER_SITE_OTHER): Payer: Medicare Other | Admitting: Family Medicine

## 2022-04-17 ENCOUNTER — Encounter: Payer: Self-pay | Admitting: Family Medicine

## 2022-04-17 VITALS — BP 109/73 | HR 69 | Temp 97.8°F | Ht 66.0 in | Wt 157.4 lb

## 2022-04-17 DIAGNOSIS — M25552 Pain in left hip: Secondary | ICD-10-CM | POA: Diagnosis not present

## 2022-04-17 NOTE — Patient Instructions (Addendum)
It was very nice to see you today!  Acetamenophen-max '3000mg'$ /day.     Continue tumeric.  Can take aleve or ibuprofen as well.   [facility] at [facility]  [address] on the 1st floor Phone number [phone]  Dr. Lynne Leader.    PLEASE NOTE:  If you had any lab tests please let us know if you have not heard back within a few days. You may see your results on MyChart before we have a chance to review them but we will give you a call once they are reviewed by [place]. If we ordered any referrals today, please let us know if you have not heard from their office within the next week.   Please try these tips to maintain a healthy lifestyle:  Eat most of your calories during the day when you are active. Eliminate processed foods including packaged sweets (pies, cakes, cookies), reduce intake of potatoes, white bread, white pasta, and white rice. Look for whole grain options, oat flour or almond flour.  Each meal should contain half fruits/vegetables, one quarter protein, and one quarter carbs (no bigger than a computer mouse).  Cut down on sweet beverages. This includes juice, soda, and sweet tea. Also watch fruit intake, though this is a healthier sweet option, it still contains natural sugar! Limit to 3 servings daily.  Drink at least 1 glass of water with each meal and aim for at least 8 glasses per day  Exercise at least 150 minutes every week.

## 2022-04-17 NOTE — Progress Notes (Signed)
Subjective:     Patient ID: Jamie [name], female    DOB: September 04, 1955, 66 y.o.   MRN: [id number]  Chief Complaint  Patient presents with   Hip Pain    Left hip pain and stiffness that started about 1 year ago, was doing PT earlier in the year for hip flexor    HPI 1 yr L hip pain and stiffness.  Did PT earlier this yr. Tylenol and nsaids didn't help.  Did PT and dry needling-improved but never 100%.  Goes to gym 4d/wk.  Less mobilty/rom d/t pain.  Hard to bend/reach.  Sometimes, hard to sleep.  Taking tylenol arthritis and tumeric.   There are no preventive care reminders to display for this patient.  Past Medical History:  Diagnosis Date   Allergy    Anemia    Anxiety    Occasionally   Arthritis    feet   Basal cell carcinoma    Breast cancer ([place]) 06/2016   left breast   Colon polyps    Eczema    Genetic testing 08/15/2016   Ms. Kim underwent genetic testing for hereditary cancer syndrome through [employer]'s 43-gene Common Hereditary Cancers Panel. Ms. 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 Result report is dated 08/15/2016. Please see genetic counseling documentation from 08/17/2016 for further discussion.   History of radiation therapy 01/02/17-01/31/17   left breast was treated to 42.72 Gy in 16 fractions, left breast boost 10 Gy in 5 fractions   Hyperlipidemia    Personal history of chemotherapy    Personal history of radiation therapy    PONV (postoperative nausea and vomiting)     Past Surgical History:  Procedure Laterality Date   BONE SPUR Bilateral 2001 AND 1988   BREAST BIOPSY     BREAST LUMPECTOMY Left    07/2016   BREAST LUMPECTOMY WITH RADIOACTIVE SEED AND SENTINEL LYMPH NODE BIOPSY Left 07/26/2016   Procedure: BREAST LUMPECTOMY WITH RADIOACTIVE SEED AND SENTINEL LYMPH NODE BIOPSY;  Surgeon: [name], MD;  Location: [facility];  Service: General;  Laterality:  Left;   BUNIONECTOMY Bilateral 02/2012   COLONOSCOPY W/ POLYPECTOMY  08/2007   DILATION AND CURETTAGE OF UTERUS  2002   MOHS SURGERY  2010   PORT-A-CATH REMOVAL N/A 07/18/2017   Procedure: REMOVAL PORT-A-CATH;  Surgeon: [name], MD;  Location: [facility];  Service: General;  Laterality: N/A;   PORTACATH PLACEMENT Right 07/26/2016   Procedure: INSERTION PORT-A-CATH;  Surgeon: [name], MD;  Location: [facility];  Service: General;  Laterality: Right;    Outpatient Medications Prior to Visit  Medication Sig Dispense Refill   BIOTIN PO Take 1 tablet by mouth daily.     calcium-vitamin D (OSCAL WITH D) 500-200 MG-UNIT tablet Take 1 tablet by mouth 2 (two) times daily.     cetirizine (ZYRTEC) 10 MG tablet Take 10 mg by mouth daily as needed (for allergies--Spring/Summer).      Cholecalciferol (VITAMIN D3) 1000 units CAPS Take 1,000 Units by mouth daily.      Cyanocobalamin (B-12) 2500 MCG TABS Place 2,500 mcg under the tongue daily.      Multiple Vitamin (MULTIVITAMIN) tablet Take 1 tablet by mouth daily.     Omega-3 Fatty Acids (OMEGA-3 FISH OIL PO) Take by mouth.     Psyllium (METAMUCIL) 28.3 % POWD      Red Yeast Rice Extract (RED YEAST RICE PO) Take  by mouth.     urea (GORDONS UREA) 40 % ointment Apply topically as needed. 30 g 0   anastrozole (ARIMIDEX) 1 MG tablet TAKE 1 TABLET DAILY 90 tablet 3   No facility-administered medications prior to visit.    Allergies  Allergen Reactions   Adhesive [Tape] Rash   Codeine Nausea Only and Other (See Comments)    Dizzy   Sulfamethoxazole Nausea And Vomiting   ROS neg/noncontributory except as noted HPI/below      Objective:     BP 109/73   Pulse 69   Temp 97.8 F (36.6 C) (Temporal)   Ht '5\' 6"'$  (1.676 m)   Wt 157 lb 6 oz (71.4 kg)   BMI 25.40 kg/m  Wt Readings from Last 3 Encounters:  04/17/22 157 lb 6 oz (71.4 kg)  10/13/21 151 lb 4 oz (68.6 kg)  08/30/21 151 lb (68.5 kg)    Physical Exam   Gen: WDWN  NAD HEENT: NCAT, conjunctiva not injected, sclera nonicteric EXT:  no edema MSK: ms 5/5 ble.  Pain w/rotation L hip.  Limited int rotation.  NEURO: A&O x3.  CN II-XII intact.  PSYCH: normal mood. Good eye contact     Assessment & Plan:   Problem List Items Addressed This Visit   None Visit Diagnoses     Pain of left hip    -  Primary      L hip pain-getting worse and limiting her more.  Cont tylenol, tumeric.  Can do aleve or ibuprofen as well.  Pt very active.  F/u sports med.    No orders of the defined types were placed in this encounter.   Wellington Hampshire, MD

## 2022-04-23 NOTE — Progress Notes (Unsigned)
I, Peterson Lombard, LAT, ATC acting as a scribe for Lynne Leader, MD.  Subjective:    CC: Left hip pain  HPI: Pt is a 66 y/o female c/o L hip pain that started about a year ago. Pt's L hip pain causes her a lack of flexibility. Pt locates pain to varying locations; anterior, groin, and sometimes into the posterior aspects.  She completed 22 sessions  of PT earlier this year for her hip lack of range of motion and pain.  This helped but she still has pain and lack of range of motion.  She continues to do her home exercise program.  She also participates in yoga.  Radiates: no LE Numbness/tingling: no LE Weakness: no Aggravates: trying to sleep, getting out of bed, rotational motions, hip flexion Treatments tried: prior PT, Tylenol  Dx imaging: 03/23/21 L hip XR  Pertinent review of Systems: No fevers or chills  Relevant historical information: History of breast cancer   Objective:    Vitals:   04/24/22 1423  BP: 104/74  Pulse: 72  SpO2: 99%   General: Well Developed, well nourished, and in no acute distress.   MSK: Left hip normal-appearing Significant decreased range of motion to flexion and internal rotation. Strength 4/5 to abduction and external rotation.  4/5 to flexion.  Otherwise strength is intact to lower extremity.  Lab and Radiology Results  X-ray images left hip obtained today personally and independently interpreted Mild left hip DJD.  Sclerosis lesion and proximal femur shaft is stable appearing hip x-ray appears to be stable from x-ray obtained in November 2022. Await formal radiology review   Impression and Recommendations:    Assessment and Plan: 66 y.o. female with continued left hip pain and lack of range of motion.  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  Her x-ray today looks about the same to me as it did last year only showing mild hip arthritis.  Plan for MRI arthrogram.  This  should be helpful to further understand the source of her pain and lack of range of motion.  Additionally we can do a steroid injection as part of the arthrogram which should be helpful.  PDMP not reviewed this encounter. Orders Placed This Encounter  Procedures   DG HIP UNILAT WITH PELVIS 2-3 VIEWS LEFT    Standing Status:   Future    Number of Occurrences:   1    Standing Expiration Date:   04/25/2023    Order Specific Question:   Reason for Exam (SYMPTOM  OR DIAGNOSIS REQUIRED)    Answer:   eval left hip pain    Order Specific Question:   Preferred imaging location?    Answer:   [facility]   MR HIP LEFT W CONTRAST    Standing Status:   Future    Standing Expiration Date:   04/25/2023    Scheduling Instructions:     Schedule with Dr. Arlis Porta hour prior to MRI for arthrogram injection.     No IV contrast.  Arthrogram only.    Order Specific Question:   Reason for Exam (SYMPTOM  OR DIAGNOSIS REQUIRED)    Answer:   Left hip pain eval labrum and lack of range motion.    Order Specific Question:   If indicated for the ordered procedure, I authorize the administration of contrast media per Radiology protocol    Answer:   Yes    Order Specific Question:  What is the patient's sedation requirement?    Answer:   No Sedation    Order Specific Question:   Does the patient have a pacemaker or implanted devices?    Answer:   No    Order Specific Question:   Preferred imaging location?    Answer:   Product/process development scientist (table limit-350lbs)   No orders of the defined types were placed in this encounter.   Discussed warning signs or symptoms. Please see discharge instructions. Patient expresses understanding.   The above documentation has been reviewed and is accurate and complete Lynne Leader, M.D.

## 2022-04-24 ENCOUNTER — Ambulatory Visit (INDEPENDENT_AMBULATORY_CARE_PROVIDER_SITE_OTHER): Payer: Medicare Other

## 2022-04-24 ENCOUNTER — Ambulatory Visit (INDEPENDENT_AMBULATORY_CARE_PROVIDER_SITE_OTHER): Payer: Medicare Other | Admitting: Family Medicine

## 2022-04-24 ENCOUNTER — Ambulatory Visit: Payer: Self-pay

## 2022-04-24 VITALS — BP 104/74 | HR 72 | Ht 66.0 in | Wt 159.0 lb

## 2022-04-24 DIAGNOSIS — M25552 Pain in left hip: Secondary | ICD-10-CM

## 2022-04-24 DIAGNOSIS — M1612 Unilateral primary osteoarthritis, left hip: Secondary | ICD-10-CM

## 2022-04-24 NOTE — Patient Instructions (Signed)
Thank you for coming in today.   Please get an Xray today before you leave   We will base our plan on the results of this xray.   If the xray looks ok I will proceed to MRI arthrogram.   If it looks bad I will move to surgery evaluation.

## 2022-04-26 ENCOUNTER — Telehealth: Payer: Self-pay | Admitting: Family Medicine

## 2022-04-26 DIAGNOSIS — M1612 Unilateral primary osteoarthritis, left hip: Secondary | ICD-10-CM

## 2022-04-26 DIAGNOSIS — M25552 Pain in left hip: Secondary | ICD-10-CM

## 2022-04-26 NOTE — Telephone Encounter (Signed)
Proceed to MRI arthrogram.

## 2022-04-26 NOTE — Progress Notes (Signed)
Left hip xray shows worsening arthritis. Rated as mild.  Plan to proceed to MRI arthrogram.

## 2022-05-02 ENCOUNTER — Encounter: Payer: Self-pay | Admitting: Family Medicine

## 2022-05-08 ENCOUNTER — Ambulatory Visit (INDEPENDENT_AMBULATORY_CARE_PROVIDER_SITE_OTHER): Payer: Medicare Other | Admitting: Sports Medicine

## 2022-05-08 ENCOUNTER — Ambulatory Visit (INDEPENDENT_AMBULATORY_CARE_PROVIDER_SITE_OTHER): Payer: Medicare Other

## 2022-05-08 DIAGNOSIS — M25552 Pain in left hip: Secondary | ICD-10-CM | POA: Diagnosis not present

## 2022-05-08 DIAGNOSIS — G8929 Other chronic pain: Secondary | ICD-10-CM

## 2022-05-08 DIAGNOSIS — M1612 Unilateral primary osteoarthritis, left hip: Secondary | ICD-10-CM

## 2022-05-08 MED ORDER — GADOBUTROL 1 MMOL/ML IV SOLN
1.0000 mL | Freq: Once | INTRAVENOUS | Status: AC | PRN
  Administered 2022-05-08: 1 mL via INTRAVENOUS

## 2022-05-08 MED ORDER — TRIAMCINOLONE ACETONIDE 40 MG/ML IJ SUSP
40.0000 mg | Freq: Once | INTRAMUSCULAR | Status: AC
  Administered 2022-05-08: 40 mg via INTRAMUSCULAR

## 2022-05-08 NOTE — Progress Notes (Signed)
    Procedures performed today:    Procedure: Real-time Ultrasound Guided gadolinium contrast injection of left hip joint Device: Samsung HS60  Verbal informed consent obtained.  Time-out conducted.  Noted no overlying erythema, induration, or other signs of local infection.  Skin prepped in a sterile fashion.  Local anesthesia: Topical Ethyl chloride.  With sterile technique and under real time ultrasound guidance: Noted minimally arthritic joint, 22-gauge spinal needle advanced to the femoral head/neck junction, contacted bone, 1 cc kenalog 40, 2 cc lidocaine, 2 cc bupivacaine injected, syringe again switched and 0.1 cc gadolinium injected, syringe again switched and 10 cc sterile saline used to fully distend the joint. Joint visualized and capsule seen distending confirming intra-articular placement of contrast material and medication. Completed without difficulty  Advised to call if fevers/chills, erythema, induration, drainage, or persistent bleeding.  Images permanently stored in PACS Impression: Technically successful ultrasound guided gadolinium contrast injection for MR arthrography.  Please see separate MR arthrogram report.  Independent interpretation of notes and tests performed by another provider:   None.  Brief History, Exam, Impression, and Recommendations:    Chronic left hip pain Injection performed today for MR arthrography, further management per primary treating provider.    ____________________________________________ Gwen Her. Dianah Field, M.D., ABFM., CAQSM., AME. Primary Care and Sports Medicine [facility]  Adjunct Professor of [employer] [facility] [employer]  Risk manager

## 2022-05-08 NOTE — Assessment & Plan Note (Signed)
Injection performed today for MR arthrography, further management per primary treating provider.

## 2022-05-09 ENCOUNTER — Ambulatory Visit (AMBULATORY_SURGERY_CENTER): Payer: Medicare Other

## 2022-05-09 VITALS — Ht 66.0 in | Wt 150.0 lb

## 2022-05-09 DIAGNOSIS — Z86010 Personal history of colonic polyps: Secondary | ICD-10-CM

## 2022-05-09 MED ORDER — NA SULFATE-K SULFATE-MG SULF 17.5-3.13-1.6 GM/177ML PO SOLN: 1.0000 | Freq: Once | ORAL | 0 refills | Status: AC

## 2022-05-09 NOTE — Progress Notes (Signed)

## 2022-05-11 NOTE — Progress Notes (Signed)
As we discussed on the phone MRI shows more hip arthritis than we saw on x-ray.  There is also torn labrum and some tendinitis.  Eventually this will likely require hip replacement.  Please let me know when the benefit of the injection wears off.

## 2022-05-13 ENCOUNTER — Encounter: Payer: Self-pay | Admitting: Gastroenterology

## 2022-05-13 ENCOUNTER — Encounter: Payer: Self-pay | Admitting: Family Medicine

## 2022-05-29 ENCOUNTER — Other Ambulatory Visit: Payer: Self-pay

## 2022-05-29 ENCOUNTER — Encounter: Payer: Self-pay | Admitting: Certified Registered Nurse Anesthetist

## 2022-05-29 DIAGNOSIS — M1612 Unilateral primary osteoarthritis, left hip: Secondary | ICD-10-CM

## 2022-05-30 ENCOUNTER — Encounter: Payer: Self-pay | Admitting: Gastroenterology

## 2022-05-30 ENCOUNTER — Ambulatory Visit (AMBULATORY_SURGERY_CENTER): Payer: Medicare Other | Admitting: Gastroenterology

## 2022-05-30 VITALS — BP 112/54 | HR 68 | Temp 96.9°F | Resp 17 | Ht 66.0 in | Wt 150.0 lb

## 2022-05-30 DIAGNOSIS — Z09 Encounter for follow-up examination after completed treatment for conditions other than malignant neoplasm: Secondary | ICD-10-CM | POA: Diagnosis not present

## 2022-05-30 DIAGNOSIS — Z86010 Personal history of colonic polyps: Secondary | ICD-10-CM

## 2022-05-30 DIAGNOSIS — D123 Benign neoplasm of transverse colon: Secondary | ICD-10-CM | POA: Diagnosis not present

## 2022-05-30 MED ORDER — SODIUM CHLORIDE 0.9 % IV SOLN: 500.0000 mL | Freq: Once | INTRAVENOUS | Status: DC

## 2022-05-30 NOTE — Patient Instructions (Signed)
Discharge instructions given. Handouts on polyps and Hemorrhoids. Resume previous medications. YOU HAD AN ENDOSCOPIC PROCEDURE TODAY AT THE [facility]:   Refer to the procedure report that was given to you for any specific questions about what was found during the examination.  If the procedure report does not answer your questions, please call your gastroenterologist to clarify.  If you requested that your care partner not be given the details of your procedure findings, then the procedure report has been included in a sealed envelope for you to review at your convenience later.  YOU SHOULD EXPECT: Some feelings of bloating in the abdomen. Passage of more gas than usual.  Walking can help get rid of the air that was put into your GI tract during the procedure and reduce the bloating. If you had a lower endoscopy (such as a colonoscopy or flexible sigmoidoscopy) you may notice spotting of blood in your stool or on the toilet paper. If you underwent a bowel prep for your procedure, you may not have a normal bowel movement for a few days.  Please Note:  You might notice some irritation and congestion in your nose or some drainage.  This is from the oxygen used during your procedure.  There is no need for concern and it should clear up in a day or so.  SYMPTOMS TO REPORT IMMEDIATELY:  Following lower endoscopy (colonoscopy or flexible sigmoidoscopy):  Excessive amounts of blood in the stool  Significant tenderness or worsening of abdominal pains  Swelling of the abdomen that is new, acute  Fever of 100F or higher   For urgent or emergent issues, a gastroenterologist can be reached at any hour by calling [phone]. Do not use MyChart messaging for urgent concerns.    DIET:  We do recommend a small meal at first, but then you may proceed to your regular diet.  Drink plenty of fluids but you should avoid alcoholic beverages for 24 hours.  ACTIVITY:  You should plan to take it  easy for the rest of today and you should NOT DRIVE or use heavy machinery until tomorrow (because of the sedation medicines used during the test).    FOLLOW UP: Our staff will call the number listed on your records the next business day following your procedure.  We will call around 7:15- 8:00 am to check on you and address any questions or concerns that you may have regarding the information given to you following your procedure. If we do not reach you, we will leave a message.     If any biopsies were taken you will be contacted by phone or by letter within the next 1-3 weeks.  Please call us at [phone] if you have not heard about the biopsies in 3 weeks.    SIGNATURES/CONFIDENTIALITY: You and/or your care partner have signed paperwork which will be entered into your electronic medical record.  These signatures attest to the fact that that the information above on your After Visit Summary has been reviewed and is understood.  Full responsibility of the confidentiality of this discharge information lies with you and/or your care-partner. 

## 2022-05-30 NOTE — Op Note (Signed)
[facility] Patient Name: Jamie [name] Procedure Date: 05/30/2022 10:33 AM MRN: [id number] Endoscopist: [name] P. Havery Moros , MD, [id number] Age: 67 Referring MD:  Date of Birth: February 02, 1956 Gender: Female Account #: [id number] Procedure:                Colonoscopy Indications:              High risk colon cancer surveillance: Personal                            history of colonic polyps - adenoma removed 03/2017                            - single MUTYH gene mutation Medicines:                Monitored Anesthesia Care Procedure:                Pre-Anesthesia Assessment:                           - Prior to the procedure, a History and Physical                            was performed, and patient medications and                            allergies were reviewed. The patient's tolerance of                            previous anesthesia was also reviewed. The risks                            and benefits of the procedure and the sedation                            options and risks were discussed with the patient.                            All questions were answered, and informed consent                            was obtained. Prior Anticoagulants: The patient has                            taken no anticoagulant or antiplatelet agents. ASA                            Grade Assessment: II - A patient with mild systemic                            disease. After reviewing the risks and benefits,                            the patient was deemed in satisfactory condition to  undergo the procedure.                           After obtaining informed consent, the colonoscope                            was passed under direct vision. Throughout the                            procedure, the patient's blood pressure, pulse, and                            oxygen saturations were monitored continuously. The                            Olympus CF-HQ190L  [id number]) Colonoscope was                            introduced through the anus and advanced to the the                            cecum, identified by appendiceal orifice and                            ileocecal valve. The colonoscopy was performed                            without difficulty. The patient tolerated the                            procedure well. The quality of the bowel                            preparation was adequate. The ileocecal valve,                            appendiceal orifice, and rectum were photographed. Scope In: 10:46:52 AM Scope Out: 11:08:29 AM Scope Withdrawal Time: 0 hours 17 minutes 19 seconds  Total Procedure Duration: 0 hours 21 minutes 37 seconds  Findings:                 The perianal and digital rectal examinations were                            normal.                           A 3 mm polyp was found in the hepatic flexure. The                            polyp was sessile. The polyp was removed with a                            cold snare. Resection and retrieval were complete.  A 3 mm polyp was found in the transverse colon. The                            polyp was sessile. The polyp was removed with a                            cold snare. Resection and retrieval were complete.                           Internal hemorrhoids were found during                            retroflexion. The hemorrhoids were small.                           The exam was otherwise without abnormality. Complications:            No immediate complications. Estimated blood loss:                            Minimal. Estimated Blood Loss:     Estimated blood loss was minimal. Impression:               - One 3 mm polyp at the hepatic flexure, removed                            with a cold snare. Resected and retrieved.                           - One 3 mm polyp in the transverse colon, removed                            with a cold snare.  Resected and retrieved.                           - Internal hemorrhoids.                           - The examination was otherwise normal. Recommendation:           - Patient has a contact number available for                            emergencies. The signs and symptoms of potential                            delayed complications were discussed with the                            patient. Return to normal activities tomorrow.                            Written discharge instructions were provided to the  patient.                           - Resume previous diet.                           - Continue present medications.                           - Await pathology results. [name] P. Ramsie Ostrander, MD 05/30/2022 11:13:34 AM This report has been signed electronically.

## 2022-05-30 NOTE — Progress Notes (Signed)
Pt's states no medical or surgical changes since previsit or office visit. VS assessed by S.S

## 2022-05-30 NOTE — Progress Notes (Signed)
Report given to PACU, vss 

## 2022-05-30 NOTE — Progress Notes (Signed)
[facility] History and Physical   Primary Care Physician:  Tawnya Crook, MD   Reason for Procedure:   Surveillance colon polyps  Plan:    colonoscopy     HPI: Jamie [name] is a 67 y.o. female  here for colonoscopy surveillance - 57m adenoma removed 03/2017, also with history of MUTYH mutation / breast cancer.   Patient denies any bowel symptoms at this time. No family history of colon cancer known. Otherwise feels well without any cardiopulmonary symptoms.   I have discussed risks / benefits of anesthesia and endoscopic procedure with ECheron Everyand they wish to proceed with the exams as outlined today.    Past Medical History:  Diagnosis Date   Allergy    Anemia    Anxiety    Occasionally   Arthritis    feet   Basal cell carcinoma    Breast cancer ([facility] 06/2016   left breast   Colon polyps    Eczema    Genetic testing 08/15/2016   Ms. Kellison underwent genetic testing for hereditary cancer syndrome through [employer]'s 43-gene Common Hereditary Cancers Panel. Ms. FGastertesting revealed a single pathogenic mutation in MUTYH and a variant of uncertain significance (VUS) in SDHB. Result report is dated 08/15/2016. Please see genetic counseling documentation from 08/17/2016 for further discussion.   History of radiation therapy 01/02/17-01/31/17   left breast was treated to 42.72 Gy in 16 fractions, left breast boost 10 Gy in 5 fractions   Hyperlipidemia    Personal history of chemotherapy    Personal history of radiation therapy    PONV (postoperative nausea and vomiting)     Past Surgical History:  Procedure Laterality Date   BONE SPUR Bilateral 2001 AND 1988   BREAST BIOPSY     BREAST LUMPECTOMY Left    07/2016   BREAST LUMPECTOMY WITH RADIOACTIVE SEED AND SENTINEL LYMPH NODE BIOPSY Left 07/26/2016   Procedure: BREAST LUMPECTOMY WITH RADIOACTIVE SEED AND SENTINEL LYMPH NODE BIOPSY;  Surgeon: FStark Klein MD;  [facility] [facility]   Service: General;  Laterality: Left;   BUNIONECTOMY Bilateral 02/2012   COLONOSCOPY W/ POLYPECTOMY  08/2007   DILATION AND CURETTAGE OF UTERUS  2002   MOHS SURGERY  2010   PORT-A-CATH REMOVAL N/A 07/18/2017   Procedure: REMOVAL PORT-A-CATH;  Surgeon: BStark Klein MD;  [place] [facility]  Service: General;  Laterality: N/A;   PORTACATH PLACEMENT Right 07/26/2016   Procedure: INSERTION PORT-A-CATH;  Surgeon: FStark Klein MD;  [place] [facility]  Service: General;  Laterality: Right;    Prior to Admission medications   Medication Sig Start Date End Date Taking? Authorizing Provider  BIOTIN PO Take 1 tablet by mouth daily.   Yes [provider]  calcium-vitamin D (OSCAL WITH D) 500-200 MG-UNIT tablet Take 1 tablet by mouth 2 (two) times daily.   Yes [provider]  cetirizine (ZYRTEC) 10 MG tablet Take 10 mg by mouth daily as needed (for allergies--Spring/Summer).    Yes [provider]  Cholecalciferol (VITAMIN D3) 1000 units CAPS Take 1,000 Units by mouth daily.    Yes [provider]  Cyanocobalamin (B-12) 2500 MCG TABS Place 2,500 mcg under the tongue daily.    Yes [provider]  Ginkgo Biloba 40 MG TABS Take by mouth.   Yes [provider]  Multiple Vitamin (MULTIVITAMIN) tablet Take 1 tablet by mouth daily.   Yes [provider]  Omega-3 Fatty Acids (OMEGA-3 FISH OIL  PO) Take by mouth.   Yes [provider]  Red Yeast Rice Extract (RED YEAST RICE PO) Take by mouth.   Yes [provider]  Turmeric (QC TUMERIC COMPLEX PO) Take by mouth.   Yes [provider]  Psyllium (METAMUCIL) 28.3 % POWD  05/21/21   [provider]  urea (GORDONS UREA) 40 % ointment Apply topically as needed. 04/07/20   Trula Slade, DPM    Current Outpatient Medications  Medication Sig Dispense Refill   BIOTIN PO Take 1 tablet by mouth daily.     calcium-vitamin D (OSCAL WITH D) 500-200 MG-UNIT  tablet Take 1 tablet by mouth 2 (two) times daily.     cetirizine (ZYRTEC) 10 MG tablet Take 10 mg by mouth daily as needed (for allergies--Spring/Summer).      Cholecalciferol (VITAMIN D3) 1000 units CAPS Take 1,000 Units by mouth daily.      Cyanocobalamin (B-12) 2500 MCG TABS Place 2,500 mcg under the tongue daily.      Ginkgo Biloba 40 MG TABS Take by mouth.     Multiple Vitamin (MULTIVITAMIN) tablet Take 1 tablet by mouth daily.     Omega-3 Fatty Acids (OMEGA-3 FISH OIL PO) Take by mouth.     Red Yeast Rice Extract (RED YEAST RICE PO) Take by mouth.     Turmeric (QC TUMERIC COMPLEX PO) Take by mouth.     Psyllium (METAMUCIL) 28.3 % POWD      urea (GORDONS UREA) 40 % ointment Apply topically as needed. 30 g 0   Current Facility-Administered Medications  Medication Dose Route Frequency Provider Last Rate Last Admin   0.9 %  sodium chloride infusion  500 mL Intravenous Once Shylynn Bruning, Carlota Raspberry, MD        Allergies as of 05/30/2022 - Review Complete 05/30/2022  Allergen Reaction Noted   Adhesive [tape] Rash 07/11/2016   Codeine Nausea Only and Other (See Comments) 08/04/2015   Sulfamethoxazole Nausea And Vomiting 08/04/2015    Family History  Problem Relation Age of Onset   Hyperlipidemia Mother    Ovarian cancer Mother 63   Asthma Mother    Cancer Mother    Hyperlipidemia Father    Cancer Father    Hypertension Father    Stroke Father    Heart disease Father    Depression Father    Heart attack Father    Hyperlipidemia Sister    Arthritis Sister    Osteoporosis Sister    Breast cancer Maternal Aunt 77       recurred at 38   Liver cancer Maternal Uncle 77   Heart attack Maternal Grandmother    Heart attack Paternal Grandmother    Heart disease Paternal Grandmother    Colon cancer Neg Hx    Colon polyps Neg Hx    Esophageal cancer Neg Hx    Rectal cancer Neg Hx    Stomach cancer Neg Hx     Social History   Socioeconomic History   Marital status: Married     Spouse name: Not on file   Number of children: 2   Years of education: Not on file   Highest education level: Not on file  Occupational History   Occupation: retired  Tobacco Use   Smoking status: Never    Passive exposure: Never   Smokeless tobacco: Never  Vaping Use   Vaping Use: Never used  Substance and Sexual Activity   Alcohol use: Yes    Alcohol/week: 4.0 standard drinks of  alcohol    Types: 4 Cans of beer per week    Comment: 1 drink/week, wine or beer   Drug use: No   Sexual activity: Yes    Birth control/protection: Post-menopausal  Other Topics Concern   Not on file  Social History Narrative   2 adopted children.  4 grands   Retired Field seismologist   Social Determinants of [place] Strain: Low Risk  (10/30/2021)   Overall Financial Resource Strain (CARDIA)    Difficulty of Paying Living Expenses: Not hard at all  Food Insecurity: No Food Insecurity (10/30/2021)   Hunger Vital Sign    Worried About Running Out of Food in the Last Year: Never true    [place] in the Last Year: Never true  Transportation Needs: No Transportation Needs (10/30/2021)   PRAPARE - Hydrologist (Medical): No    Lack of Transportation (Non-Medical): No  Physical Activity: Sufficiently Active (10/30/2021)   Exercise Vital Sign    Days of Exercise per Week: 5 days    Minutes of Exercise per Session: 90 min  Stress: No Stress Concern Present (10/30/2021)   [place]    Feeling of Stress : Not at all  Social Connections: Moderately Integrated (10/30/2021)   Social Connection and Isolation Panel [NHANES]    Frequency of Communication with Friends and Family: Twice a week    Frequency of Social Gatherings with Friends and Family: More than three times a week    Attends Religious Services: More than 4 times per year    Active Member of [employer] or Organizations: No     Attends Archivist Meetings: Not on file    Marital Status: Married  Human resources officer Violence: Not At Risk (10/30/2021)   Humiliation, Afraid, Rape, and Kick questionnaire    Fear of Current or Ex-Partner: No    Emotionally Abused: No    Physically Abused: No    Sexually Abused: No    Review of Systems: All other review of systems negative except as mentioned in the HPI.  Physical Exam: Vital signs BP 108/78   Pulse 64   Temp (!) 96.9 F (36.1 C) (Skin)   Ht '5\' 6"'$  (1.676 m)   Wt 150 lb (68 kg)   SpO2 98%   BMI 24.21 kg/m   General:   Alert,  Well-developed, pleasant and cooperative in NAD Lungs:  Clear throughout to auscultation.   Heart:  Regular rate and rhythm Abdomen:  Soft, nontender and nondistended.   Neuro/Psych:  Alert and cooperative. Normal mood and affect. A and O x 3  Jolly Mango, MD [facility]

## 2022-05-31 ENCOUNTER — Telehealth: Payer: Self-pay

## 2022-05-31 NOTE — Telephone Encounter (Signed)
  Follow up Call-     05/30/2022   10:10 AM  Call back number  Post procedure Call Back phone  # [phone]  Permission to leave phone message Yes     Patient questions:  Do you have a fever, pain , or abdominal swelling? No. Pain Score  0 *  Have you tolerated food without any problems? Yes.    Have you been able to return to your normal activities? Yes.    Do you have any questions about your discharge instructions: Diet   No. Medications  No. Follow up visit  No.  Do you have questions or concerns about your Care? No.  Actions: * If pain score is 4 or above: No action needed, pain <4.

## 2022-06-15 ENCOUNTER — Other Ambulatory Visit: Payer: Self-pay | Admitting: Family Medicine

## 2022-06-15 DIAGNOSIS — Z1231 Encounter for screening mammogram for malignant neoplasm of breast: Secondary | ICD-10-CM

## 2022-07-03 ENCOUNTER — Ambulatory Visit: Payer: Medicare Other | Admitting: Orthopaedic Surgery

## 2022-07-10 ENCOUNTER — Ambulatory Visit (INDEPENDENT_AMBULATORY_CARE_PROVIDER_SITE_OTHER): Payer: Medicare Other | Admitting: Orthopaedic Surgery

## 2022-07-10 ENCOUNTER — Encounter: Payer: Self-pay | Admitting: Orthopaedic Surgery

## 2022-07-10 DIAGNOSIS — M1612 Unilateral primary osteoarthritis, left hip: Secondary | ICD-10-CM

## 2022-07-10 NOTE — Progress Notes (Signed)
Office Visit Note   Patient: Jamie [name]           Date of Birth: 08/24/1955           MRN: [id number] Visit Date: 07/10/2022              Requested by: Gregor Hams, MD [place],  [place] [address] PCP: Tawnya Crook, MD   Assessment & Plan: Visit Diagnoses:  1. Primary osteoarthritis of left hip     Plan: Impression is severe left hip degenerative joint disease secondary to Osteoarthritis.  MRI shows high grade cartilage loss of articular surfaces.  At this point, conservative treatments fail to provide any significant relief and the pain is severely affecting ADLs and quality of life.  Based on treatment options, the patient has elected to move forward with a hip replacement.  We have discussed the surgical risks that include but are not limited to infection, DVT, leg length discrepancy, numbness, tingling, incomplete relief of pain.  Recovery and prognosis were also reviewed.  Jamie [name] will call the patient to schedule surgery.  Patient has 2 upcoming vacations in May and July therefore she will call us back later this year to schedule surgery.  She may consider trying an intra-articular injection as long as it is more than 90 days before she plans to do the hip replacement.  We also had a brief discussion utilizing a bikini incision if her surface anatomy supports it.  Current anticoagulants: No antithrombotic Postop anticoagulation: Aspirin 81 mg Diabetic: No  Prior DVT/PE: No Tobacco use: No Clearances needed for surgery: None Anticipate discharge dispo: home   Follow-Up Instructions: No follow-ups on file.   Orders:  No orders of the defined types were placed in this encounter.  No orders of the defined types were placed in this encounter.     Procedures: No procedures performed   Clinical Data: No additional findings.   Subjective: Chief Complaint  Patient presents with   Left Hip - Pain    HPI  Jamie [name] is a 67 year old female  referral from Dr. Georgina Snell for surgical consultation of left hip pain and DJD.  She has had pain and has gotten worse over the last 3 years.  Has groin pain and deep hip pain.  It affects ADLs and sleeping.  She is retired.  She has done physical therapy without significant relief.  She has trouble lifting her leg as high as she used to because of pain.  Currently takes Advil and Tylenol as needed.  She is very active and goes to the gym and does aerobics.  Review of Systems  Constitutional: Negative.   HENT: Negative.    Eyes: Negative.   Respiratory: Negative.    Cardiovascular: Negative.   Endocrine: Negative.   Musculoskeletal: Negative.   Neurological: Negative.   Hematological: Negative.   Psychiatric/Behavioral: Negative.    All other systems reviewed and are negative.    Objective: Vital Signs: There were no vitals taken for this visit.  Physical Exam Vitals and nursing note reviewed.  Constitutional:      Appearance: She is well-developed.  HENT:     Head: Atraumatic.     Nose: Nose normal.  Eyes:     Extraocular Movements: Extraocular movements intact.  Cardiovascular:     Pulses: Normal pulses.  Pulmonary:     Effort: Pulmonary effort is normal.  Abdominal:     Palpations: Abdomen is soft.  Musculoskeletal:  Cervical back: Neck supple.  Skin:    General: Skin is warm.     Capillary Refill: Capillary refill takes less than 2 seconds.  Neurological:     Mental Status: She is alert. Mental status is at baseline.  Psychiatric:        Behavior: Behavior normal.        Thought Content: Thought content normal.        Judgment: Judgment normal.     Ortho Exam  Examination left hip shows reproducible left hip pain and groin pain with movement of the hip.  She has limitation with external rotation.  Specialty Comments:  No specialty comments available.  Imaging: No results found.   PMFS History: Patient Active Problem List   Diagnosis Date Noted    Chronic left hip pain 05/08/2022   Family history of coronary arteriosclerosis 10/13/2021   Primary osteoarthritis of left hip 03/23/2021   Family history of osteoporosis 06/12/2018   History of breast cancer 06/12/2018   Advance directive discussed with patient 04/03/2018   Pap smear of cervix declined 04/03/2018   Lumbago 08/16/2017   Basal cell carcinoma 07/17/2017   Vaginismus 07/17/2017   Abnormal TSH 03/28/2017   Diarrhea 11/05/2016   Port catheter in place 10/23/2016   Genetic testing 08/15/2016   Malignant neoplasm of upper-outer quadrant of left breast in female, estrogen receptor positive ([place]) 07/10/2016   Allergic rhinitis 06/13/2016   History of basal cell carcinoma 06/13/2016   Eczema 06/13/2016   Hyperlipemia 06/13/2016   History of colon polyps 06/13/2016   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 Weipert underwent genetic testing for hereditary cancer syndrome through [facility]'s 43-gene Common Hereditary Cancers Panel. Ms. [place]all testing revealed a single pathogenic mutation in MUTYH and a variant of uncertain significance (VUS) in SDHB. Result report is dated 08/15/2016. Please see genetic counseling documentation from 08/17/2016 for further discussion.   History of radiation therapy 01/02/17-01/31/17   left breast was treated to 42.72 Gy in 16 fractions, left breast boost 10 Gy in 5 fractions   Hyperlipidemia    Personal history of chemotherapy    Personal history of radiation therapy    PONV (postoperative nausea and vomiting)     Family History  Problem Relation Age of Onset   Hyperlipidemia  Mother    Ovarian cancer Mother 85   Asthma Mother    Cancer Mother    Hyperlipidemia Father    Cancer Father    Hypertension Father    Stroke Father    Heart disease Father    Depression Father    Heart attack Father    Hyperlipidemia Sister    Arthritis Sister    Osteoporosis Sister    Breast cancer Maternal Aunt 77       recurred at 31   Liver cancer Maternal Uncle 77   Heart attack Maternal Grandmother    Heart attack Paternal Grandmother    Heart disease Paternal Grandmother    Colon cancer Neg Hx    Colon polyps Neg Hx  Esophageal cancer Neg Hx    Rectal cancer Neg Hx    Stomach cancer Neg Hx     Past Surgical History:  Procedure Laterality Date   BONE SPUR Bilateral 2001 AND 1988   BREAST BIOPSY     BREAST LUMPECTOMY Left    07/2016   BREAST LUMPECTOMY WITH RADIOACTIVE SEED AND SENTINEL LYMPH NODE BIOPSY Left 07/26/2016   Procedure: BREAST LUMPECTOMY WITH RADIOACTIVE SEED AND SENTINEL LYMPH NODE BIOPSY;  Surgeon: [name], MD;  Location: [facility];  Service: General;  Laterality: Left;   BUNIONECTOMY Bilateral 02/2012   COLONOSCOPY W/ POLYPECTOMY  08/2007   DILATION AND CURETTAGE OF UTERUS  2002   MOHS SURGERY  2010   PORT-A-CATH REMOVAL N/A 07/18/2017   Procedure: REMOVAL PORT-A-CATH;  Surgeon: [name], MD;  Location: [facility];  Service: General;  Laterality: N/A;   PORTACATH PLACEMENT Right 07/26/2016   Procedure: INSERTION PORT-A-CATH;  Surgeon: [name], MD;  Location: [facility];  Service: General;  Laterality: Right;   Social History   Occupational History   Occupation: retired  Tobacco Use   Smoking status: Never    Passive exposure: Never   Smokeless tobacco: Never  Vaping Use   Vaping Use: Never used  Substance and Sexual Activity   Alcohol use: Yes    Alcohol/week: 4.0 standard drinks of alcohol    Types: 4 Cans of beer per week    Comment: 1 drink/week, wine or beer   Drug use: No   Sexual activity: Yes     Birth control/protection: Post-menopausal

## 2022-07-12 ENCOUNTER — Encounter: Payer: Self-pay | Admitting: Orthopaedic Surgery

## 2022-08-03 ENCOUNTER — Ambulatory Visit
Admission: RE | Admit: 2022-08-03 | Discharge: 2022-08-03 | Disposition: A | Discharge status: Home / Self Care | Payer: Medicare Other | Source: Ambulatory Visit | Attending: Family Medicine | Admitting: Family Medicine

## 2022-08-03 DIAGNOSIS — Z1231 Encounter for screening mammogram for malignant neoplasm of breast: Secondary | ICD-10-CM

## 2022-08-06 LAB — HM MAMMOGRAPHY

## 2022-08-07 ENCOUNTER — Encounter: Payer: Self-pay | Admitting: Family Medicine

## 2022-09-10 ENCOUNTER — Encounter: Payer: Self-pay | Admitting: Family Medicine

## 2022-09-10 ENCOUNTER — Ambulatory Visit (INDEPENDENT_AMBULATORY_CARE_PROVIDER_SITE_OTHER): Payer: Medicare Other | Admitting: Family Medicine

## 2022-09-10 VITALS — BP 90/60 | HR 71 | Temp 98.0°F | Ht 66.0 in | Wt 156.5 lb

## 2022-09-10 DIAGNOSIS — F5103 Paradoxical insomnia: Secondary | ICD-10-CM

## 2022-09-10 DIAGNOSIS — G2581 Restless legs syndrome: Secondary | ICD-10-CM

## 2022-09-10 DIAGNOSIS — K120 Recurrent oral aphthae: Secondary | ICD-10-CM | POA: Diagnosis not present

## 2022-09-10 MED ORDER — ROPINIROLE HCL 0.25 MG PO TABS: 0.2500 mg | ORAL_TABLET | Freq: Every day | ORAL | 3 refills | Status: DC

## 2022-09-10 MED ORDER — CHLORHEXIDINE GLUCONATE 0.12 % MT SOLN: 15.0000 mL | Freq: Two times a day (BID) | OROMUCOSAL | 0 refills | Status: DC

## 2022-09-10 NOTE — Progress Notes (Signed)
Subjective:     Patient ID: Jamie [name], female    DOB: 04/11/1956, 67 y.o.   MRN: [id number]  Chief Complaint  Patient presents with   Sore under tongue    Sore under tongue, noticed 1 week ago, no pain, but looks worse, was using hydrogen peroxide on it     HPI  Sore under tongue for 1 week(s).   no fevers/chills.  Looking worse.  Did try H2O2.  No new foods, toothpaste, etc.  Some pain but improving.  History of canker sores.  Having more strawberries and blueberries.   Insomnia-hard to fall or stay asleep.  Also restless legs syndrome.  Taking tumeric and pepper.  Occasional will take benadryl-mixed results.   Hip replacement schedule for August  Health Maintenance Due  Topic Date Due   Medicare Annual Wellness (AWV)  10/31/2022    Past Medical History:  Diagnosis Date   Allergy    Anemia    Anxiety    Occasionally   Arthritis    feet   Basal cell carcinoma    Breast cancer 06/2016   left breast   Colon polyps    Eczema    Genetic testing 08/15/2016   Ms. Giles underwent genetic testing for hereditary cancer syndrome through [facility]'s 43-gene Common Hereditary Cancers Panel. Ms. [place]lin testing revealed a single pathogenic mutation in MUTYH and a variant of uncertain significance (VUS) in SDHB. Result report is dated 08/15/2016. Please see genetic counseling documentation from 08/17/2016 for further discussion.   History of radiation therapy 01/02/17-01/31/17   left breast was treated to 42.72 Gy in 16 fractions, left breast boost 10 Gy in 5 fractions   Hyperlipidemia    Personal history of chemotherapy    Personal history of radiation therapy    PONV (postoperative nausea and vomiting)     Past Surgical History:  Procedure Laterality Date   BONE SPUR Bilateral 2001 AND 1988   BREAST BIOPSY     BREAST LUMPECTOMY Left    07/2016   BREAST LUMPECTOMY WITH RADIOACTIVE SEED AND SENTINEL LYMPH NODE BIOPSY Left 07/26/2016   Procedure: BREAST LUMPECTOMY WITH  RADIOACTIVE SEED AND SENTINEL LYMPH NODE BIOPSY;  Surgeon: Almond Lint, MD;  Location: [facility];  Service: General;  Laterality: Left;   BUNIONECTOMY Bilateral 02/2012   COLONOSCOPY W/ POLYPECTOMY  08/2007   DILATION AND CURETTAGE OF UTERUS  2002   MOHS SURGERY  2010   PORT-A-CATH REMOVAL N/A 07/18/2017   Procedure: REMOVAL PORT-A-CATH;  Surgeon: Almond Lint, MD;  [facility];  Service: General;  Laterality: N/A;   PORTACATH PLACEMENT Right 07/26/2016   Procedure: INSERTION PORT-A-CATH;  Surgeon: Almond Lint, MD;  Location: [facility];  Service: General;  Laterality: Right;     Current Outpatient Medications:    BIOTIN PO, Take 1 tablet by mouth daily., Disp: , Rfl:    calcium-vitamin D (OSCAL WITH D) 500-200 MG-UNIT tablet, Take 1 tablet by mouth 2 (two) times daily., Disp: , Rfl:    cetirizine (ZYRTEC) 10 MG tablet, Take 10 mg by mouth daily as needed (for allergies--Spring/Summer). , Disp: , Rfl:    chlorhexidine (PERIDEX) 0.12 % solution, Use as directed 15 mLs in the mouth or throat 2 (two) times daily., Disp: 120 mL, Rfl: 0   Cholecalciferol (VITAMIN D3) 1000 units CAPS, Take 1,000 Units by mouth daily. , Disp: , Rfl:    Cyanocobalamin (B-12) 2500 MCG TABS, Place 2,500 mcg under the tongue daily. ,  Disp: , Rfl:    Ginkgo Biloba 40 MG TABS, Take by mouth., Disp: , Rfl:    Multiple Vitamin (MULTIVITAMIN) tablet, Take 1 tablet by mouth daily., Disp: , Rfl:    Na Sulfate-K Sulfate-Mg Sulf 17.5-3.13-1.6 GM/177ML SOLN, Take by mouth., Disp: , Rfl:    Omega-3 Fatty Acids (OMEGA-3 FISH OIL PO), Take by mouth., Disp: , Rfl:    Psyllium (METAMUCIL) 28.3 % POWD, , Disp: , Rfl:    Red Yeast Rice Extract (RED YEAST RICE PO), Take by mouth., Disp: , Rfl:    rOPINIRole (REQUIP) 0.25 MG tablet, Take 1 tablet (0.25 mg total) by mouth at bedtime., Disp: 30 tablet, Rfl: 3   Turmeric (QC TUMERIC COMPLEX PO), Take by mouth., Disp: , Rfl:    urea (GORDONS UREA) 40 %  ointment, Apply topically as needed., Disp: 30 g, Rfl: 0  Allergies  Allergen Reactions   Adhesive [Tape] Rash   Codeine Nausea Only and Other (See Comments)    Dizzy   Sulfamethoxazole Nausea And Vomiting   ROS neg/noncontributory except as noted HPI/below      Objective:     BP 90/60   Pulse 71   Temp 98 F (36.7 C) (Temporal)   Ht  (1.676 m)   Wt 156 lb 8 oz (71 kg)   SpO2 97%   BMI 25.26 kg/m  Wt Readings from Last 3 Encounters:  09/10/22 156 lb 8 oz (71 kg)  05/30/22 150 lb (68 kg)  05/09/22 150 lb (68 kg)    Physical Exam   Gen: WDWN NAD HEENT: NCAT, conjunctiva not injected, sclera nonicteric NECK:  supple, no thyromegaly, no nodes, no carotid bruits Oral: enlarged, red, ulcer on left sublingual caruncle.  EXT:  no edema MSK: no gross abnormalities.  NEURO: A&O x3.  CN II-XII intact.  PSYCH: normal mood. Good eye contact     Assessment & Plan:  Aphthous ulcer  RLS (restless legs syndrome)  Paradoxical insomnia  Other orders -     Chlorhexidine Gluconate; Use as directed 15 mLs in the mouth or throat 2 (two) times daily.  Dispense: 120 mL; Refill: 0 -     rOPINIRole HCl; Take 1 tablet (0.25 mg total) by mouth at bedtime.  Dispense: 30 tablet; Refill: 3   Aphthous ulcer-chlorhexadine rinse twice daily.  L-lysine Restless legs syndrome-[place] 0.25 mg at nightly.  Can increase weekly to max 1 mg.  Let us know.  Consider gabapentin Insomnia-probably from restless legs syndrome.  See above.  Some anxiety worrying about not falling asleep.    Angelena Sole, MD

## 2022-09-10 NOTE — Patient Instructions (Addendum)
L-lysine vitamin daily when bothering you.    Requip at night for 1 week then increase it weekly if needed.  Max of 1 mg.  Keep me informed.

## 2022-09-25 ENCOUNTER — Other Ambulatory Visit: Payer: Self-pay

## 2022-09-25 ENCOUNTER — Ambulatory Visit (INDEPENDENT_AMBULATORY_CARE_PROVIDER_SITE_OTHER): Payer: Medicare Other | Admitting: Sports Medicine

## 2022-09-25 ENCOUNTER — Ambulatory Visit: Payer: Self-pay | Admitting: Podiatry

## 2022-09-25 ENCOUNTER — Encounter: Payer: Self-pay | Admitting: Sports Medicine

## 2022-09-25 DIAGNOSIS — G8929 Other chronic pain: Secondary | ICD-10-CM

## 2022-09-25 DIAGNOSIS — M1612 Unilateral primary osteoarthritis, left hip: Secondary | ICD-10-CM

## 2022-09-25 DIAGNOSIS — M25552 Pain in left hip: Secondary | ICD-10-CM | POA: Diagnosis not present

## 2022-09-25 MED ORDER — LIDOCAINE HCL 1 % IJ SOLN
4.0000 mL | INTRAMUSCULAR | Status: AC | PRN
  Administered 2022-09-25: 4 mL

## 2022-09-25 MED ORDER — METHYLPREDNISOLONE ACETATE 40 MG/ML IJ SUSP
80.0000 mg | INTRAMUSCULAR | Status: AC | PRN
  Administered 2022-09-25: 80 mg via INTRA_ARTICULAR

## 2022-09-25 NOTE — Progress Notes (Signed)
Jamie [name] - 67 y.o. female MRN [id number]  Date of birth: April 02, 1956  Office Visit Note: Visit Date: 09/25/2022 PCP: Jeani Sow, MD Referred by: Jeani Sow, MD  Subjective: Chief Complaint  Patient presents with   Left Hip - Pain   HPI: Jamie [name] is a pleasant 67 y.o. female who presents today for left hip pain with known osteoarthritis.  She is scheduled to undergo a left hip total arthroplasty with Dr. Roda Shutters on 12/31/2022.  She has plans for the summer taking trips prior to this. She is hoping to have some relief until this time, interested in injection. Last one was in December that gave her 2-3 months of relief.  She is not diabetic.  Pertinent ROS were reviewed with the patient and found to be negative unless otherwise specified above in HPI.   Assessment & Plan: Visit Diagnoses:  1. Primary osteoarthritis of left hip   2. Chronic left hip pain    Plan: Discussed with Jamie [name] treatment options for her left hip osteoarthritis.  She has a few upcoming vacations where she is going to be doing a lot of walking, tentatively planning for hip replacement in August.  Through shared decision-making, we will proceed with intra-articular ultrasound-guided hip injection, patient tolerated well.  She may use Tylenol, ibuprofen and/or ice for any postinjection pain.  After 48 hours of modified rest, she will get back into activity and may resume back at the gym.  Did discuss with her the importance of maintaining stretching, yoga, and other activities to maintain range of motion about the hip.  She will plan on following up with Dr. Roda Shutters for preprocedural planning, I am happy to see her back as needed.   Follow-up: Return if symptoms worsen or fail to improve.   Meds & Orders: No orders of the defined types were placed in this encounter.   Orders Placed This Encounter  Procedures   Large Joint Inj   US Guided Needle Placement - No Linked Charges    Procedures: Large Joint  Inj: L hip joint on 09/25/2022 9:47 AM Indications: pain Details: 22 G 3.5 in needle, ultrasound-guided anterior approach Medications: 4 mL lidocaine 1 %; 80 mg methylPREDNISolone acetate 40 MG/ML Outcome: tolerated well, no immediate complications  Procedure: US-guided intra-articular hip injection, left After discussion on risks/benefits/indications and informed verbal consent was obtained, a timeout was performed. Patient was lying supine on exam table. The hip was cleaned with betadine and alcohol swabs. Then utilizing ultrasound guidance, the patient's femoral head and neck junction was identified and subsequently injected with 4:2 lidocaine:depomedrol via an in-plane approach with ultrasound visualization of the injectate administered into the hip joint. Patient tolerated procedure well without immediate complications.  Procedure, treatment alternatives, risks and benefits explained, specific risks discussed. Consent was given by the patient. Immediately prior to procedure a time out was called to verify the correct patient, procedure, equipment, support staff and site/side marked as required. Patient was prepped and draped in the usual sterile fashion.          Clinical History: No specialty comments available.  She reports that she has never smoked. She has never been exposed to tobacco smoke. She has never used smokeless tobacco.  Recent Labs    10/17/21 0835  HGBA1C 5.7    Objective:   Vital Signs: There were no vitals taken for this visit.  Physical Exam  Gen: Well-appearing, in no acute distress; non-toxic CV:  Well-perfused. Warm.  Resp:  Breathing unlabored on room air; no wheezing. Psych: Fluid speech in conversation; appropriate affect; normal thought process Neuro: Sensation intact throughout. No gross coordination deficits.   Ortho Exam - Left hip: No redness swelling or effusion of the joint.  There is limitation in pain with internal rotation/FADIR. Gait without  assistance or supportive device.  Imaging: Narrative & Impression  CLINICAL DATA:  Left hip pain, limited range of motion for 1 year   EXAM: MRI OF THE LEFT HIP WITH CONTRAST (MR Arthrogram)   TECHNIQUE: Multiplanar, multisequence MR imaging of the hip was performed immediately following contrast injection into the hip joint under fluoroscopic guidance. No intravenous contrast was administered.   COMPARISON:  None Available.   FINDINGS: Bone   No hip fracture, dislocation or avascular necrosis. No aggressive osseous lesion.   SI joints are normal. No SI joint widening or erosive changes.   Lower lumbar spine demonstrates no focal abnormality.   Alignment   Normal. No subluxation.   Dysplasia   None.   Joint effusion   Intraarticular contrast distends the left hip joint capsule. Otherwise, no joint effusions.   Labrum   Extensive left anterior labral tear.   Cartilage   High-grade partial-thickness cartilage loss with areas of full-thickness cartilage loss of the left femoral head and acetabulum with subchondral reactive marrow edema in the left superior acetabulum.   Capsule and ligaments   Normal.   Muscles and Tendons   Flexors: Normal.   Extensors: Normal.   Abductors: Normal.   Adductors: Normal.   Gluteals: Moderate tendinosis of the left gluteus minimus tendon.   Hamstrings: Mild tendinosis of the hamstring origin bilaterally.   Other Findings   No bursal fluid.   Viscera   No abnormality seen in pelvis. No lymphadenopathy. No free fluid in the pelvis.   IMPRESSION: 1. Extensive left anterior labral tear. 2. High-grade partial-thickness cartilage loss with areas of full-thickness cartilage loss of the left femoral head and acetabulum consistent with moderate-severe osteoarthritis. 3. Moderate tendinosis of the left gluteus minimus tendon. 4. Mild tendinosis of the hamstring origin bilaterally.     Electronically Signed   By:  Elige Ko M.D.   On: 05/11/2022 08:28    Past Medical/Family/Surgical/Social History: Medications & Allergies reviewed per EMR, new medications updated. Patient Active Problem List   Diagnosis Date Noted   Chronic left hip pain 05/08/2022   Family history of coronary arteriosclerosis 10/13/2021   Primary osteoarthritis of left hip 03/23/2021   Family history of osteoporosis 06/12/2018   History of breast cancer 06/12/2018   Advance directive discussed with patient 04/03/2018   Pap smear of cervix declined 04/03/2018   Lumbago 08/16/2017   Basal cell carcinoma 07/17/2017   Vaginismus 07/17/2017   Abnormal TSH 03/28/2017   Diarrhea 11/05/2016   Port catheter in place 10/23/2016   Genetic testing 08/15/2016   Malignant neoplasm of upper-outer quadrant of left breast in female, estrogen receptor positive (HCC) 07/10/2016   Allergic rhinitis 06/13/2016   History of basal cell carcinoma 06/13/2016   Eczema 06/13/2016   Hyperlipemia 06/13/2016   History of colon polyps 06/13/2016   Plantar fascial fibromatosis 06/13/2016   RLS (restless legs syndrome) 06/13/2016   Subclinical hypothyroidism 07/20/2013   Bunion 01/21/2013   Osteopenia 01/20/2013   Past Medical History:  Diagnosis Date   Allergy    Anemia    Anxiety    Occasionally   Arthritis    feet   Basal cell carcinoma    Breast  cancer (HCC) 06/2016   left breast   Colon polyps    Eczema    Genetic testing 08/15/2016   Jamie [name] underwent genetic testing for hereditary cancer syndrome through [employer]'s 43-gene Common Hereditary Cancers Panel. 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 Result report is dated 08/15/2016. Please see genetic counseling documentation from 08/17/2016 for further discussion.   History of radiation therapy 01/02/17-01/31/17   left breast was treated to 42.72 Gy in 16 fractions, left breast boost 10 Gy in 5 fractions    Hyperlipidemia    Personal history of chemotherapy    Personal history of radiation therapy    PONV (postoperative nausea and vomiting)    Family History  Problem Relation Age of Onset   Hyperlipidemia Mother    Ovarian cancer Mother 69   Asthma Mother    Cancer Mother    Hyperlipidemia Father    Cancer Father    Hypertension Father    Stroke Father    Heart disease Father    Depression Father    Heart attack Father    Hyperlipidemia Sister    Arthritis Sister    Osteoporosis Sister    Breast cancer Maternal Aunt 40       recurred at 64   Liver cancer Maternal Uncle 77   Heart attack Maternal Grandmother    Heart attack Paternal Grandmother    Heart disease Paternal Grandmother    Colon cancer Neg Hx    Colon polyps Neg Hx    Esophageal cancer Neg Hx    Rectal cancer Neg Hx    Stomach cancer Neg Hx    Past Surgical History:  Procedure Laterality Date   BONE SPUR Bilateral 2001 AND 1988   BREAST BIOPSY     BREAST LUMPECTOMY Left    07/2016   BREAST LUMPECTOMY WITH RADIOACTIVE SEED AND SENTINEL LYMPH NODE BIOPSY Left 07/26/2016   Procedure: BREAST LUMPECTOMY WITH RADIOACTIVE SEED AND SENTINEL LYMPH NODE BIOPSY;  Surgeon: Almond Lint, MD;  Location: [facility];  Service: General;  Laterality: Left;   BUNIONECTOMY Bilateral 02/2012   COLONOSCOPY W/ POLYPECTOMY  08/2007   DILATION AND CURETTAGE OF UTERUS  2002   MOHS SURGERY  2010   PORT-A-CATH REMOVAL N/A 07/18/2017   Procedure: REMOVAL PORT-A-CATH;  Surgeon: Almond Lint, MD;  Location: [facility];  Service: General;  Laterality: N/A;   PORTACATH PLACEMENT Right 07/26/2016   Procedure: INSERTION PORT-A-CATH;  Surgeon: Almond Lint, MD;  Location: [facility];  Service: General;  Laterality: Right;   Social History   Occupational History   Occupation: retired  Tobacco Use   Smoking status: Never    Passive exposure: Never   Smokeless tobacco: Never  Vaping Use   Vaping Use: Never used   Substance and Sexual Activity   Alcohol use: Yes    Alcohol/week: 4.0 standard drinks of alcohol    Types: 4 Cans of beer per week    Comment: 1 drink/week, wine or beer   Drug use: No   Sexual activity: Yes    Birth control/protection: Post-menopausal

## 2022-09-26 ENCOUNTER — Ambulatory Visit: Payer: Self-pay | Admitting: Podiatry

## 2022-10-23 ENCOUNTER — Ambulatory Visit: Payer: Medicare Other

## 2022-10-23 ENCOUNTER — Other Ambulatory Visit (INDEPENDENT_AMBULATORY_CARE_PROVIDER_SITE_OTHER): Payer: Medicare Other

## 2022-10-23 DIAGNOSIS — M1612 Unilateral primary osteoarthritis, left hip: Secondary | ICD-10-CM

## 2022-11-13 ENCOUNTER — Ambulatory Visit (INDEPENDENT_AMBULATORY_CARE_PROVIDER_SITE_OTHER): Payer: Medicare Other | Admitting: Family Medicine

## 2022-11-13 ENCOUNTER — Encounter: Payer: Self-pay | Admitting: Family Medicine

## 2022-11-13 VITALS — BP 104/60 | HR 50 | Temp 97.9°F | Resp 16 | Ht 66.0 in | Wt 157.0 lb

## 2022-11-13 DIAGNOSIS — E7800 Pure hypercholesterolemia, unspecified: Secondary | ICD-10-CM

## 2022-11-13 DIAGNOSIS — M1612 Unilateral primary osteoarthritis, left hip: Secondary | ICD-10-CM

## 2022-11-13 DIAGNOSIS — R600 Localized edema: Secondary | ICD-10-CM | POA: Diagnosis not present

## 2022-11-13 DIAGNOSIS — Z853 Personal history of malignant neoplasm of breast: Secondary | ICD-10-CM

## 2022-11-13 DIAGNOSIS — R7989 Other specified abnormal findings of blood chemistry: Secondary | ICD-10-CM | POA: Diagnosis not present

## 2022-11-13 DIAGNOSIS — R7303 Prediabetes: Secondary | ICD-10-CM | POA: Diagnosis not present

## 2022-11-13 DIAGNOSIS — M62830 Muscle spasm of back: Secondary | ICD-10-CM

## 2022-11-13 DIAGNOSIS — G2581 Restless legs syndrome: Secondary | ICD-10-CM | POA: Diagnosis not present

## 2022-11-13 NOTE — Progress Notes (Deleted)
Phone [phone]   Subjective:   Patient is a 67 y.o. female presenting for annual physical.    Chief Complaint  Patient presents with   Annual Exam    Annual Check-up    RLS-requ[place] helps but still feels some and takes second one. While watching TV at 9pm, legs start and takes meds 1030.   Insomnia-from RLS.   Breast ca-surg, chemo and rad-L.  Lately, occ spasm front and back-severe. Can last 1 min or more.  Can go weeks w/o.  L hip THA in August.   Senior fit, aerobics, light weights.  Can do stairs w/o cardiac/respiratory.  Limiting factors are the hip pain. Rides bike.    See problem oriented charting- ROS- ROS: Gen: no fever, chills  Skin: no rash, itching ENT: no ear pain, ear drainage, nasal congestion, rhinorrhea, sinus pressure, sore throat Eyes: no blurry vision, double vision Resp: no cough, wheeze,SOB CV: no CP, palpitations, LE edema,  GI: no heartburn, n/v/d/, abd pain.  Chronic constipation GU: no dysuria, urgency, frequency, hematuria MSK: no joint pain, myalgias, back pain Neuro: no dizziness, headache, weakness, vertigo Psych: no depression, anxiety, insomnia, SI   The following were reviewed and entered/updated in epic: Past Medical History:  Diagnosis Date   Allergy    Anemia    Anxiety    Occasionally   Arthritis    feet   Basal cell carcinoma    Breast cancer (HCC) 06/2016   left breast   Colon polyps    Eczema    Genetic testing 08/15/2016   Ms. Mcgruder underwent genetic testing for hereditary cancer syndrome through [facility]'s 43-gene Common Hereditary Cancers Panel. Ms. Englander testing revealed a single pathogenic mutation in MUTYH and a variant of uncertain significance (VUS) in SDHB. Result report is dated 08/15/2016. Please see genetic counseling documentation from 08/17/2016 for further discussion.   History of radiation therapy 01/02/17-01/31/17   left breast was treated to 42.72 Gy in 16 fractions, left breast boost 10 Gy in 5 fractions    Hyperlipidemia    Personal history of chemotherapy    Personal history of radiation therapy    PONV (postoperative nausea and vomiting)    Patient Active Problem List   Diagnosis Date Noted   Chronic left hip pain 05/08/2022   Family history of coronary arteriosclerosis 10/13/2021   Primary osteoarthritis of left hip 03/23/2021   Family history of osteoporosis 06/12/2018   History of breast cancer 06/12/2018   Advance directive discussed with patient 04/03/2018   Pap smear of cervix declined 04/03/2018   Lumbago 08/16/2017   Basal cell carcinoma 07/17/2017   Vaginismus 07/17/2017   Abnormal TSH 03/28/2017   Diarrhea 11/05/2016   Port catheter in place 10/23/2016   Genetic testing 08/15/2016   Malignant neoplasm of upper-outer quadrant of left breast in female, estrogen receptor positive (HCC) 07/10/2016   Allergic rhinitis 06/13/2016   History of basal cell carcinoma 06/13/2016   Eczema 06/13/2016   Hyperlipemia 06/13/2016   History of colon polyps 06/13/2016   Plantar fascial fibromatosis 06/13/2016   RLS (restless legs syndrome) 06/13/2016   Subclinical hypothyroidism 07/20/2013   Bunion 01/21/2013   Osteopenia 01/20/2013   Past Surgical History:  Procedure Laterality Date   BONE SPUR Bilateral 2001 AND 1988   BREAST BIOPSY     BREAST LUMPECTOMY Left    07/2016   BREAST LUMPECTOMY WITH RADIOACTIVE SEED AND SENTINEL LYMPH NODE BIOPSY Left 07/26/2016   Procedure: BREAST LUMPECTOMY WITH RADIOACTIVE SEED AND SENTINEL LYMPH  NODE BIOPSY;  Surgeon: Almond Lint, MD;  Location: [facility];  Service: General;  Laterality: Left;   BUNIONECTOMY Bilateral 02/2012   COLONOSCOPY W/ POLYPECTOMY  08/2007   DILATION AND CURETTAGE OF UTERUS  2002   MOHS SURGERY  2010   PORT-A-CATH REMOVAL N/A 07/18/2017   Procedure: REMOVAL PORT-A-CATH;  Surgeon: Almond Lint, MD;  Location: [facility];  Service: General;  Laterality: N/A;   PORTACATH PLACEMENT Right 07/26/2016   Procedure:  INSERTION PORT-A-CATH;  Surgeon: Almond Lint, MD;  Location: [facility];  Service: General;  Laterality: Right;    Family History  Problem Relation Age of Onset   Hyperlipidemia Mother    Ovarian cancer Mother 67   Asthma Mother    Cancer Mother    Hyperlipidemia Father    Cancer Father    Hypertension Father    Stroke Father    Heart disease Father    Depression Father    Heart attack Father    Hyperlipidemia Sister    Arthritis Sister    Osteoporosis Sister    Breast cancer Maternal Aunt 32       recurred at 47   Liver cancer Maternal Uncle 77   Heart attack Maternal Grandmother    Heart attack Paternal Grandmother    Heart disease Paternal Grandmother    Colon cancer Neg Hx    Colon polyps Neg Hx    Esophageal cancer Neg Hx    Rectal cancer Neg Hx    Stomach cancer Neg Hx     Medications- reviewed and updated Current Outpatient Medications  Medication Sig Dispense Refill   BIOTIN PO Take 1 tablet by mouth daily.     calcium-vitamin D (OSCAL WITH D) 500-200 MG-UNIT tablet Take 1 tablet by mouth 2 (two) times daily.     cetirizine (ZYRTEC) 10 MG tablet Take 10 mg by mouth daily as needed (for allergies--Spring/Summer).      Cholecalciferol (VITAMIN D3) 1000 units CAPS Take 1,000 Units by mouth daily.      Cyanocobalamin (B-12) 2500 MCG TABS Place 2,500 mcg under the tongue daily.      Ginkgo Biloba 40 MG TABS Take by mouth.     Multiple Vitamin (MULTIVITAMIN) tablet Take 1 tablet by mouth daily.     Omega-3 Fatty Acids (OMEGA-3 FISH OIL PO) Take by mouth.     Psyllium (METAMUCIL) 28.3 % POWD      Red Yeast Rice Extract (RED YEAST RICE PO) Take by mouth.     rOPINIRole (REQUIP) 0.25 MG tablet Take 1 tablet (0.25 mg total) by mouth at bedtime. 30 tablet 3   Turmeric (QC TUMERIC COMPLEX PO) Take by mouth.     urea (GORDONS UREA) 40 % ointment Apply topically as needed. 30 g 0   No current facility-administered medications for this visit.     Allergies-reviewed and updated Allergies  Allergen Reactions   Adhesive [Tape] Rash   Codeine Nausea Only and Other (See Comments)    Dizzy   Sulfamethoxazole Nausea And Vomiting   Wound Dressing Adhesive Rash    Social History   Social History Narrative   2 adopted children.  4 grands   Retired office mgr/HR/exec assist   Objective  Objective:  BP 104/60   Pulse (!) 50   Temp 97.9 F (36.6 C) (Temporal)   Resp 16   Ht 5\' 6"  (1.676 m)   Wt 157 lb (71.2 kg)   SpO2 96%   BMI 25.34 kg/m  Physical Exam  Gen: WDWN NAD HEENT: NCAT, conjunctiva not injected, sclera nonicteric TM WNL B, OP moist, no exudates  NECK:  supple, no thyromegaly, no nodes, no carotid bruits CARDIAC: RRR, S1S2+, no murmur. DP 2+B LUNGS: CTAB. No wheezes ABDOMEN:  BS+, soft, NTND, No HSM, no masses EXT:  no edema MSK: no gross abnormalities. MS 5/5 all 4 NEURO: A&O x3.  CN II-XII intact.  PSYCH: normal mood. Good eye contact     Assessment and Plan   Health Maintenance counseling: 1. Anticipatory guidance: Patient counseled regarding regular dental exams q6 months, eye exams,  avoiding smoking and second hand smoke, limiting alcohol to 1 beverage per day, no illicit drugs.   2. Risk factor reduction:  Advised patient of need for regular exercise and diet rich and fruits and vegetables to reduce risk of heart attack and stroke. Exercise- ***.  Wt Readings from Last 3 Encounters:  11/13/22 157 lb (71.2 kg)  09/10/22 156 lb 8 oz (71 kg)  05/30/22 150 lb (68 kg)   3. Immunizations/screenings/ancillary studies Immunization History  Administered Date(s) Administered   Fluad Quad(high Dose 65+) 02/04/2020   Influenza Split 03/24/2009, 02/18/2014, 03/08/2015   Influenza,inj,Quad PF,6+ Mos 02/18/2014, 02/13/2017, 01/14/2018, 02/10/2019   Influenza-Unspecified 03/24/2009, 03/08/2015, 02/23/2022   Moderna Sars-Covid-2 Vaccination 06/15/2019, 07/13/2019, 03/14/2020   PNEUMOCOCCAL CONJUGATE-20  09/19/2020   Pfizer Covid-19 Vaccine Bivalent Booster 53yrs & up 07/27/2021   Td 05/17/1999   Td (Adult),5 Lf Tetanus Toxid, Preservative Free 05/17/1999   Tdap 10/30/2010, 03/23/2021   Zoster Recombinat (Shingrix) 05/01/2018, 07/11/2018   Zoster, Live 02/15/2015   Health Maintenance Due  Topic Date Due   Medicare Annual Wellness (AWV)  10/31/2022    4. Cervical cancer screening- *** 5. Breast cancer screening-  mammogram *** 6. Colon cancer screening - *** 7. Skin cancer screening- advised regular sunscreen use. Denies worrisome, changing, or new skin lesions.  8. Birth control/STD check- *** 9. Osteoporosis screening- *** 10. Smoking associated screening - *** smoker  There are no diagnoses linked to this encounter. Cleared for surgery Recommended follow up: No follow-ups on file.  Lab/Order associations:*** fasting  Angelena Sole, MD

## 2022-11-13 NOTE — Assessment & Plan Note (Signed)
Check TSH 

## 2022-11-13 NOTE — Patient Instructions (Signed)

## 2022-11-13 NOTE — Assessment & Plan Note (Signed)
Managed by onc 

## 2022-11-13 NOTE — Assessment & Plan Note (Signed)
Chronic.  Controlled on requip 0.25mg  but doesn't take till 10:30 and goes to bed 1 hr later.  Legs bother her starting at 9pm.  Advised to take meds at 8pm and see if better.  May possibly need 0.5mg .  will message w/progress.

## 2022-11-13 NOTE — Assessment & Plan Note (Signed)
Pt going for surgery.  Medically stable/low risk for surgery.

## 2022-11-13 NOTE — Assessment & Plan Note (Signed)
Chronic, but new.  Working on Scientist, water quality

## 2022-11-13 NOTE — Progress Notes (Signed)
Subjective:     Patient ID: GERENE NEDD, female    DOB: 1955-10-19, 67 y.o.   MRN: [id number]  Chief Complaint  Patient presents with   Annual Exam    Annual Check-up     HPI RLS-requip helps but still feels some when ready to go to bed, so takes second one. While watching TV at 9pm, legs start and takes meds 1030 so not doing well.  No symptoms during day Insomnia-from RLS-resolved w/requip 0.25mg .   Breast ca-surg, chemo and rad-L.  Lately, occ spasm front and back-severe. Can last 1 min or more.  Can go weeks w/o. No specific trigger. L hip THA sch in August.  Active.  does Senior fit, aerobics, light weights.  Can do stairs w/o cardiac/respiratory limitations.  Limiting factors are the hip pain. Rides bike as well.    See problem oriented charting- ROS- ROS: Gen: no fever, chills  Skin: no rash, itching ENT: no ear pain, ear drainage, nasal congestion, rhinorrhea, sinus pressure, sore throat Eyes: no blurry vision, double vision Resp: no cough, wheeze,SOB CV: no CP, palpitations, rare LE edema,  GI: no heartburn, n/v/d/, abd pain.  Chronic constipation GU: no dysuria, urgency, frequency, hematuria MSK: HPI Neuro: no dizziness, headache, weakness, vertigo Psych: no depression, anxiety, insomnia, SI   Health Maintenance Due  Topic Date Due   Medicare Annual Wellness (AWV)  10/31/2022    Past Medical History:  Diagnosis Date   Allergy    Anemia    Anxiety    Occasionally   Arthritis    feet   Basal cell carcinoma    Breast cancer (HCC) 06/2016   left breast   Colon polyps    Eczema    Genetic testing 08/15/2016   Ms. Fruth underwent genetic testing for hereditary cancer syndrome through [facility]'s 43-gene Common Hereditary Cancers Panel. Ms. 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 Result report is dated 08/15/2016. Please see genetic counseling documentation from 08/17/2016 for  further discussion.   History of radiation therapy 01/02/17-01/31/17   left breast was treated to 42.72 Gy in 16 fractions, left breast boost 10 Gy in 5 fractions   Hyperlipidemia    Personal history of chemotherapy    Personal history of radiation therapy    PONV (postoperative nausea and vomiting)     Past Surgical History:  Procedure Laterality Date   BONE SPUR Bilateral 2001 AND 1988   BREAST BIOPSY     BREAST LUMPECTOMY Left    07/2016   BREAST LUMPECTOMY WITH RADIOACTIVE SEED AND SENTINEL LYMPH NODE BIOPSY Left 07/26/2016   Procedure: BREAST LUMPECTOMY WITH RADIOACTIVE SEED AND SENTINEL LYMPH NODE BIOPSY;  Surgeon: Almond Lint, MD;  Location: [facility];  Service: General;  Laterality: Left;   BUNIONECTOMY Bilateral 02/2012   COLONOSCOPY W/ POLYPECTOMY  08/2007   DILATION AND CURETTAGE OF UTERUS  2002   MOHS SURGERY  2010   PORT-A-CATH REMOVAL N/A 07/18/2017   Procedure: REMOVAL PORT-A-CATH;  Surgeon: Almond Lint, MD;  Location: [facility];  Service: General;  Laterality: N/A;   PORTACATH PLACEMENT Right 07/26/2016   Procedure: INSERTION PORT-A-CATH;  Surgeon: Almond Lint, MD;  Location: [facility];  Service: General;  Laterality: Right;     Current Outpatient Medications:    BIOTIN PO, Take 1 tablet by mouth daily., Disp: , Rfl:    calcium-vitamin D (OSCAL WITH D) 500-200 MG-UNIT tablet, Take 1  tablet by mouth 2 (two) times daily., Disp: , Rfl:    cetirizine (ZYRTEC) 10 MG tablet, Take 10 mg by mouth daily as needed (for allergies--Spring/Summer). , Disp: , Rfl:    Cholecalciferol (VITAMIN D3) 1000 units CAPS, Take 1,000 Units by mouth daily. , Disp: , Rfl:    Cyanocobalamin (B-12) 2500 MCG TABS, Place 2,500 mcg under the tongue daily. , Disp: , Rfl:    Ginkgo Biloba 40 MG TABS, Take by mouth., Disp: , Rfl:    Multiple Vitamin (MULTIVITAMIN) tablet, Take 1 tablet by mouth daily., Disp: , Rfl:    Omega-3 Fatty Acids (OMEGA-3 FISH OIL PO), Take by mouth.,  Disp: , Rfl:    Psyllium (METAMUCIL) 28.3 % POWD, , Disp: , Rfl:    Red Yeast Rice Extract (RED YEAST RICE PO), Take by mouth., Disp: , Rfl:    rOPINIRole (REQUIP) 0.25 MG tablet, Take 1 tablet (0.25 mg total) by mouth at bedtime., Disp: 30 tablet, Rfl: 3   Turmeric (QC TUMERIC COMPLEX PO), Take by mouth., Disp: , Rfl:    urea (GORDONS UREA) 40 % ointment, Apply topically as needed., Disp: 30 g, Rfl: 0  Allergies  Allergen Reactions   Adhesive [Tape] Rash   Codeine Nausea Only and Other (See Comments)    Dizzy   Sulfamethoxazole Nausea And Vomiting   Wound Dressing Adhesive Rash   ROS neg/noncontributory except as noted HPI/below      Objective:     BP 104/60   Pulse (!) 50   Temp 97.9 F (36.6 C) (Temporal)   Resp 16   Ht 5\' 6"  (1.676 m)   Wt 157 lb (71.2 kg)   SpO2 96%   BMI 25.34 kg/m  Wt Readings from Last 3 Encounters:  11/13/22 157 lb (71.2 kg)  09/10/22 156 lb 8 oz (71 kg)  05/30/22 150 lb (68 kg)    Physical Exam   Gen: WDWN NAD HEENT: NCAT, conjunctiva not injected, sclera nonicteric TM WNL B, OP moist, no exudates  NECK:  supple, no thyromegaly, no nodes, no carotid bruits CARDIAC: RRR, S1S2+, no murmur. DP 2+B LUNGS: CTAB. No wheezes ABDOMEN:  BS+, soft, NTND, No HSM, no masses EXT:  no edema MSK: no gross abnormalities. No TTP spine, no TTP paraspinous muscles NEURO: A&O x3.  CN II-XII intact.  PSYCH: normal mood. Good eye contact     Assessment & Plan:  RLS (restless legs syndrome) Assessment & Plan: Chronic.  Controlled on requip 0.25mg  but doesn't take till 10:30 and goes to bed 1 hr later.  Legs bother her starting at 9pm.  Advised to take meds at 8pm and see if better.  May possibly need 0.5mg .  will message w/progress.    Orders: -     CBC with Differential/Platelet -     Comprehensive metabolic panel -     Iron, TIBC and Ferritin Panel -     Vitamin B12  Pure hypercholesterolemia Assessment & Plan: Chronic.  New.  Working on  diet/exercise  Orders: -     Lipid panel  Prediabetes Assessment & Plan: Chronic, but new.  Working on diet/exercise-continue  Orders: -     Comprehensive metabolic panel -     Hemoglobin A1c  Abnormal TSH Assessment & Plan: Check TSH  Orders: -     TSH  Localized edema -     TSH  Muscle spasm of back  Primary osteoarthritis of left hip Assessment & Plan: Pt going for surgery.  Medically  stable/low risk for surgery.    History of breast cancer Assessment & Plan: Managed by onc   Localized edema-rare.  Check TSH.  Remain active Muscle spasms back/chest-may be from chemo/rad scar tissue from breast ca.  Keep log.  F/u ortho/sports med  Return in about 1 year (around 11/13/2023) for restless legs in 1 yr.     soon for AWV w/Tina.  Angelena Sole, MD

## 2022-11-13 NOTE — Assessment & Plan Note (Signed)
Chronic.  New.  Working on diet/exercise

## 2022-11-14 LAB — COMPREHENSIVE METABOLIC PANEL
ALT: 14 U/L (ref 0–35)
AST: 18 U/L (ref 0–37)
Albumin: 4.5 g/dL (ref 3.5–5.2)
Alkaline Phosphatase: 50 U/L (ref 39–117)
BUN: 12 mg/dL (ref 6–23)
CO2: 26 mEq/L (ref 19–32)
Calcium: 10.1 mg/dL (ref 8.4–10.5)
Chloride: 102 mEq/L (ref 96–112)
Creatinine, Ser: 0.8 mg/dL (ref 0.40–1.20)
GFR: 76 mL/min (ref >60.00)
Glucose, Bld: 90 mg/dL (ref 70–99)
Potassium: 4 mEq/L (ref 3.5–5.1)
Sodium: 137 mEq/L (ref 135–145)
Total Bilirubin: 0.5 mg/dL (ref 0.2–1.2)
Total Protein: 7.1 g/dL (ref 6.0–8.3)

## 2022-11-14 LAB — IRON,TIBC AND FERRITIN PANEL
%SAT: 24 % (calc) (ref 16–45)
Ferritin: 82 ng/mL (ref 16–288)
Iron: 88 ug/dL (ref 45–160)
TIBC: 366 mcg/dL (calc) (ref 250–450)

## 2022-11-14 LAB — CBC WITH DIFFERENTIAL/PLATELET
Basophils Absolute: 0.1 10*3/uL (ref 0.0–0.1)
Basophils Relative: 1.2 % (ref 0.0–3.0)
Eosinophils Absolute: 0.2 10*3/uL (ref 0.0–0.7)
Eosinophils Relative: 2.3 % (ref 0.0–5.0)
HCT: 38.9 % (ref 36.0–46.0)
Hemoglobin: 12.8 g/dL (ref 12.0–15.0)
Lymphocytes Relative: 21.8 % (ref 12.0–46.0)
Lymphs Abs: 1.9 10*3/uL (ref 0.7–4.0)
MCHC: 33 g/dL (ref 30.0–36.0)
MCV: 93.2 fl (ref 78.0–100.0)
Monocytes Absolute: 0.7 10*3/uL (ref 0.1–1.0)
Monocytes Relative: 7.9 % (ref 3.0–12.0)
Neutro Abs: 5.9 10*3/uL (ref 1.4–7.7)
Neutrophils Relative %: 66.8 % (ref 43.0–77.0)
Platelets: 363 10*3/uL (ref 150.0–400.0)
RBC: 4.18 Mil/uL (ref 3.87–5.11)
RDW: 13 % (ref 11.5–15.5)
WBC: 8.8 10*3/uL (ref 4.0–10.5)

## 2022-11-14 LAB — HEMOGLOBIN A1C: Hgb A1c MFr Bld: 5.7 % (ref 4.6–6.5)

## 2022-11-14 LAB — LIPID PANEL
Cholesterol: 220 mg/dL — ABNORMAL HIGH (ref 0–200)
HDL: 61.9 mg/dL (ref >39.00)
LDL Cholesterol: 135 mg/dL — ABNORMAL HIGH (ref 0–99)
NonHDL: 158.08
Total CHOL/HDL Ratio: 4
Triglycerides: 116 mg/dL (ref 0.0–149.0)
VLDL: 23.2 mg/dL (ref 0.0–40.0)

## 2022-11-14 LAB — VITAMIN B12: Vitamin B-12: >1500 pg/mL — ABNORMAL HIGH (ref 211–911)

## 2022-11-14 LAB — TSH: TSH: 3.24 u[IU]/mL (ref 0.35–5.50)

## 2022-11-15 ENCOUNTER — Encounter: Payer: Self-pay | Admitting: Family Medicine

## 2022-11-15 NOTE — Progress Notes (Signed)
Labs look great except cholesterol about the same.  Does she want to consider meds or keep working on it?

## 2022-11-16 ENCOUNTER — Encounter: Payer: Self-pay | Admitting: Orthopaedic Surgery

## 2022-11-19 ENCOUNTER — Ambulatory Visit (INDEPENDENT_AMBULATORY_CARE_PROVIDER_SITE_OTHER): Payer: Medicare Other

## 2022-11-19 VITALS — Wt 157.0 lb

## 2022-11-19 DIAGNOSIS — Z0000 Encounter for general adult medical examination without abnormal findings: Secondary | ICD-10-CM | POA: Diagnosis not present

## 2022-11-19 NOTE — Patient Instructions (Signed)
[name] , Thank you for taking time to come for your Medicare Wellness Visit. I appreciate your ongoing commitment to your health goals. Please review the following plan we discussed and let me know if I can assist you in the future.   These are the goals we discussed:  Goals      Patient Stated     Keep active and keep weight stable      Patient Stated     Stay active and healthy         This is a list of the screening recommended for you and due dates:  Health Maintenance  Topic Date Due   COVID-19 Vaccine (5 - 2023-24 season) 12/31/2022*   Flu Shot  12/20/2022   Medicare Annual Wellness Visit  11/19/2023   Mammogram  08/05/2024   Colon Cancer Screening  05/31/2027   DTaP/Tdap/Td vaccine (5 - Td or Tdap) 03/24/2031   Pneumonia Vaccine  Completed   DEXA scan (bone density measurement)  Completed   Hepatitis C Screening  Completed   Zoster (Shingles) Vaccine  Completed   HPV Vaccine  Aged Out  *Topic was postponed. The date shown is not the original due date.    Advanced directives: Please bring a copy of your health care power of attorney and living will to the office at your convenience.  Conditions/risks identified: stay healthy and active   Next appointment: Follow up in one year for your annual wellness visit    Preventive Care 65 Years and Older, Female Preventive care refers to lifestyle choices and visits with your health care provider that can promote health and wellness. What does preventive care include? A yearly physical exam. This is also called an annual well check. Dental exams once or twice a year. Routine eye exams. Ask your health care provider how often you should have your eyes checked. Personal lifestyle choices, including: Daily care of your teeth and gums. Regular physical activity. Eating a healthy diet. Avoiding tobacco and drug use. Limiting alcohol use. Practicing safe sex. Taking low-dose aspirin every day. Taking vitamin and mineral  supplements as recommended by your health care provider. What happens during an annual well check? The services and screenings done by your health care provider during your annual well check will depend on your age, overall health, lifestyle risk factors, and family history of disease. Counseling  Your health care provider may ask you questions about your: Alcohol use. Tobacco use. Drug use. Emotional well-being. Home and relationship well-being. Sexual activity. Eating habits. History of falls. Memory and ability to understand (cognition). Work and work Astronomer. Reproductive health. Screening  You may have the following tests or measurements: Height, weight, and BMI. Blood pressure. Lipid and cholesterol levels. These may be checked every 5 years, or more frequently if you are over 18 years old. Skin check. Lung cancer screening. You may have this screening every year starting at age 77 if you have a 30-pack-year history of smoking and currently smoke or have quit within the past 15 years. Fecal occult blood test (FOBT) of the stool. You may have this test every year starting at age 50. Flexible sigmoidoscopy or colonoscopy. You may have a sigmoidoscopy every 5 years or a colonoscopy every 10 years starting at age 42. Hepatitis C blood test. Hepatitis B blood test. Sexually transmitted disease (STD) testing. Diabetes screening. This is done by checking your blood sugar (glucose) after you have not eaten for a while (fasting). You may have this done  every 1-3 years. Bone density scan. This is done to screen for osteoporosis. You may have this done starting at age 86. Mammogram. This may be done every 1-2 years. Talk to your health care provider about how often you should have regular mammograms. Talk with your health care provider about your test results, treatment options, and if necessary, the need for more tests. Vaccines  Your health care provider may recommend certain  vaccines, such as: Influenza vaccine. This is recommended every year. Tetanus, diphtheria, and acellular pertussis (Tdap, Td) vaccine. You may need a Td booster every 10 years. Zoster vaccine. You may need this after age 67. Pneumococcal 13-valent conjugate (PCV13) vaccine. One dose is recommended after age 73. Pneumococcal polysaccharide (PPSV23) vaccine. One dose is recommended after age 60. Talk to your health care provider about which screenings and vaccines you need and how often you need them. This information is not intended to replace advice given to you by your health care provider. Make sure you discuss any questions you have with your health care provider. Document Released: 06/03/2015 Document Revised: 01/25/2016 Document Reviewed: 03/08/2015 Elsevier Interactive Patient Education  2017 [place] Prevention in the Home Falls can cause injuries. They can happen to people of all ages. There are many things you can do to make your home safe and to help prevent falls. What can I do on the outside of my home? Regularly fix the edges of walkways and driveways and fix any cracks. Remove anything that might make you trip as you walk through a door, such as a raised step or threshold. Trim any bushes or trees on the path to your home. Use bright outdoor lighting. Clear any walking paths of anything that might make someone trip, such as rocks or tools. Regularly check to see if handrails are loose or broken. Make sure that both sides of any steps have handrails. Any raised decks and porches should have guardrails on the edges. Have any leaves, snow, or ice cleared regularly. Use sand or salt on walking paths during winter. Clean up any spills in your garage right away. This includes oil or grease spills. What can I do in the bathroom? Use night lights. Install grab bars by the toilet and in the tub and shower. Do not use towel bars as grab bars. Use non-skid mats or decals in  the tub or shower. If you need to sit down in the shower, use a plastic, non-slip stool. Keep the floor dry. Clean up any water that spills on the floor as soon as it happens. Remove soap buildup in the tub or shower regularly. Attach bath mats securely with double-sided non-slip rug tape. Do not have throw rugs and other things on the floor that can make you trip. What can I do in the bedroom? Use night lights. Make sure that you have a light by your bed that is easy to reach. Do not use any sheets or blankets that are too big for your bed. They should not hang down onto the floor. Have a firm chair that has side arms. You can use this for support while you get dressed. Do not have throw rugs and other things on the floor that can make you trip. What can I do in the kitchen? Clean up any spills right away. Avoid walking on wet floors. Keep items that you use a lot in easy-to-reach places. If you need to reach something above you, use a strong step stool that has  a grab bar. Keep electrical cords out of the way. Do not use floor polish or wax that makes floors slippery. If you must use wax, use non-skid floor wax. Do not have throw rugs and other things on the floor that can make you trip. What can I do with my stairs? Do not leave any items on the stairs. Make sure that there are handrails on both sides of the stairs and use them. Fix handrails that are broken or loose. Make sure that handrails are as long as the stairways. Check any carpeting to make sure that it is firmly attached to the stairs. Fix any carpet that is loose or worn. Avoid having throw rugs at the top or bottom of the stairs. If you do have throw rugs, attach them to the floor with carpet tape. Make sure that you have a light switch at the top of the stairs and the bottom of the stairs. If you do not have them, ask someone to add them for you. What else can I do to help prevent falls? Wear shoes that: Do not have high  heels. Have rubber bottoms. Are comfortable and fit you well. Are closed at the toe. Do not wear sandals. If you use a stepladder: Make sure that it is fully opened. Do not climb a closed stepladder. Make sure that both sides of the stepladder are locked into place. Ask someone to hold it for you, if possible. Clearly mark and make sure that you can see: Any grab bars or handrails. First and last steps. Where the edge of each step is. Use tools that help you move around (mobility aids) if they are needed. These include: Canes. Walkers. Scooters. Crutches. Turn on the lights when you go into a dark area. Replace any light bulbs as soon as they burn out. Set up your furniture so you have a clear path. Avoid moving your furniture around. If any of your floors are uneven, fix them. If there are any pets around you, be aware of where they are. Review your medicines with your doctor. Some medicines can make you feel dizzy. This can increase your chance of falling. Ask your doctor what other things that you can do to help prevent falls. This information is not intended to replace advice given to you by your health care provider. Make sure you discuss any questions you have with your health care provider. Document Released: 03/03/2009 Document Revised: 10/13/2015 Document Reviewed: 06/11/2014 Elsevier Interactive Patient Education  2017 [employer].

## 2022-11-19 NOTE — Progress Notes (Signed)
Subjective:   Jamie [name] is a 67 y.o. female who presents for Medicare Annual (Subsequent) preventive examination.  Visit Complete: Virtual  I connected with  ADISON BULLION on 11/19/22 by a audio enabled telemedicine application and verified that I am speaking with the correct person using two identifiers.  Patient Location: Home  Provider Location: Office/Clinic  I discussed the limitations of evaluation and management by telemedicine. The patient expressed understanding and agreed to proceed.  Patient Medicare AWV questionnaire was completed by the patient on 11/15/22; I have confirmed that all information answered by patient is correct and no changes since this date.  Review of Systems     Cardiac Risk Factors include: advanced age (>69men, >35 women);dyslipidemia     Objective:    Today's Vitals   11/19/22 1505  Weight: 157 lb (71.2 kg)   Body mass index is 25.34 kg/m.     11/19/2022    3:10 PM 10/30/2021    1:16 PM 08/29/2021    3:38 PM 04/24/2021    1:45 PM 02/19/2020   10:21 AM 08/26/2017   11:58 AM 07/31/2017    9:38 AM  Advanced Directives  Does Patient Have a Medical Advance Directive? Yes Yes Yes Yes Yes Yes Yes  Type of Estate agent of [place];Living will Healthcare Power of [employer] of [place];Living will Living will;Healthcare Power of [employer] Power of [employer] Power of Home;Living will  Does patient want to make changes to medical advance directive?     No - Patient declined    Copy of Healthcare Power of Attorney in Chart? No - copy requested No - copy requested   No - copy requested No - copy requested No - copy requested    Current Medications (verified) Outpatient Encounter Medications as of 11/19/2022  Medication Sig   BIOTIN PO Take 1 tablet by mouth daily.   calcium-vitamin D (OSCAL WITH D) 500-200 MG-UNIT tablet Take 1 tablet by mouth 2 (two) times daily.   cetirizine (ZYRTEC) 10  MG tablet Take 10 mg by mouth daily as needed (for allergies--Spring/Summer).    Cholecalciferol (VITAMIN D3) 1000 units CAPS Take 1,000 Units by mouth daily.    Cyanocobalamin (B-12) 2500 MCG TABS Place 2,500 mcg under the tongue daily.    Ginkgo Biloba 40 MG TABS Take by mouth.   Multiple Vitamin (MULTIVITAMIN) tablet Take 1 tablet by mouth daily.   Omega-3 Fatty Acids (OMEGA-3 FISH OIL PO) Take by mouth.   Psyllium (METAMUCIL) 28.3 % POWD    Red Yeast Rice Extract (RED YEAST RICE PO) Take by mouth.   rOPINIRole (REQUIP) 0.25 MG tablet Take 1 tablet (0.25 mg total) by mouth at bedtime.   Turmeric (QC TUMERIC COMPLEX PO) Take by mouth.   urea (GORDONS UREA) 40 % ointment Apply topically as needed.   No facility-administered encounter medications on file as of 11/19/2022.    Allergies (verified) Adhesive [tape], Codeine, Sulfamethoxazole, and Wound dressing adhesive   History: Past Medical History:  Diagnosis Date   Allergy    Anemia    Anxiety    Occasionally   Arthritis    feet   Basal cell carcinoma    Breast cancer (HCC) 06/2016   left breast   Colon polyps    Eczema    Genetic testing 08/15/2016   Ms. Gohman underwent genetic testing for hereditary cancer syndrome through [facility]'s 43-gene Common Hereditary Cancers Panel. Ms. 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 Result report is dated 08/15/2016. Please see genetic counseling documentation from 08/17/2016 for further discussion.   History of radiation therapy 01/02/17-01/31/17   left breast was treated to 42.72 Gy in 16 fractions, left breast boost 10 Gy in 5 fractions   Hyperlipidemia    Personal history of chemotherapy    Personal history of radiation therapy    PONV (postoperative nausea and vomiting)    Past Surgical History:  Procedure Laterality Date   BONE SPUR Bilateral 2001 AND 1988   BREAST BIOPSY     BREAST LUMPECTOMY Left     07/2016   BREAST LUMPECTOMY WITH RADIOACTIVE SEED AND SENTINEL LYMPH NODE BIOPSY Left 07/26/2016   Procedure: BREAST LUMPECTOMY WITH RADIOACTIVE SEED AND SENTINEL LYMPH NODE BIOPSY;  Surgeon: Almond Lint, MD;  Location: [facility];  Service: General;  Laterality: Left;   BUNIONECTOMY Bilateral 02/2012   COLONOSCOPY W/ POLYPECTOMY  08/2007   DILATION AND CURETTAGE OF UTERUS  2002   MOHS SURGERY  2010   PORT-A-CATH REMOVAL N/A 07/18/2017   Procedure: REMOVAL PORT-A-CATH;  Surgeon: Almond Lint, MD;  Location: [facility];  Service: General;  Laterality: N/A;   PORTACATH PLACEMENT Right 07/26/2016   Procedure: INSERTION PORT-A-CATH;  Surgeon: Almond Lint, MD;  Location: [facility];  Service: General;  Laterality: Right;   Family History  Problem Relation Age of Onset   Hyperlipidemia Mother    Ovarian cancer Mother 27   Asthma Mother    Cancer Mother    Hyperlipidemia Father    Cancer Father    Hypertension Father    Stroke Father    Heart disease Father    Depression Father    Heart attack Father    Hyperlipidemia Sister    Arthritis Sister    Osteoporosis Sister    Breast cancer Maternal Aunt 16       recurred at 42   Liver cancer Maternal Uncle 77   Heart attack Maternal Grandmother    Heart attack Paternal Grandmother    Heart disease Paternal Grandmother    Colon cancer Neg Hx    Colon polyps Neg Hx    Esophageal cancer Neg Hx    Rectal cancer Neg Hx    Stomach cancer Neg Hx    Social History   Socioeconomic History   Marital status: Married    Spouse name: Not on file   Number of children: 2   Years of education: Not on file   Highest education level: Associate degree: academic program  Occupational History   Occupation: retired  Tobacco Use   Smoking status: Never    Passive exposure: Never   Smokeless tobacco: Never  Vaping Use   Vaping Use: Never used  Substance and Sexual Activity   Alcohol use: Yes    Alcohol/week: 4.0 standard  drinks of alcohol    Types: 4 Cans of beer per week    Comment: 1 drink/week, wine or beer   Drug use: No   Sexual activity: Yes    Birth control/protection: Post-menopausal  Other Topics Concern   Not on file  Social History Narrative   2 adopted children.  4 grands   Retired Arts administrator   Social Determinants of Health   Financial Resource Strain: Low Risk  (11/15/2022)   Overall Financial Resource Strain (CARDIA)    Difficulty of Paying Living Expenses: Not hard at all  Food Insecurity: No Food  Insecurity (11/15/2022)   Hunger Vital Sign    Worried About Running Out of Food in the Last Year: Never true    Ran Out of Food in the Last Year: Never true  Transportation Needs: No Transportation Needs (11/15/2022)   PRAPARE - Administrator, Civil Service (Medical): No    Lack of Transportation (Non-Medical): No  Physical Activity: Sufficiently Active (11/15/2022)   Exercise Vital Sign    Days of Exercise per Week: 4 days    Minutes of Exercise per Session: 50 min  Stress: No Stress Concern Present (11/15/2022)   [employer] of Occupational Health - Occupational Stress Questionnaire    Feeling of Stress : Only a little  Social Connections: Socially Integrated (11/15/2022)   Social Connection and Isolation Panel [NHANES]    Frequency of Communication with Friends and Family: More than three times a week    Frequency of Social Gatherings with Friends and Family: Three times a week    Attends Religious Services: More than 4 times per year    Active Member of Clubs or Organizations: Yes    Attends Engineer, structural: More than 4 times per year    Marital Status: Married    Tobacco Counseling Counseling given: Not Answered   Clinical Intake:  Pre-visit preparation completed: Yes  Pain : No/denies pain     BMI - recorded: 25.34 Nutritional Status: BMI 25 -29 Overweight Nutritional Risks: None Diabetes: No  How often do you need to  have someone help you when you read instructions, pamphlets, or other written materials from your doctor or pharmacy?: 1 - Never  Interpreter Needed?: No  Information entered by :: Lanier Ensign, LPN   Activities of Daily Living    11/15/2022    2:17 PM  In your present state of health, do you have any difficulty performing the following activities:  Hearing? 0  Vision? 0  Difficulty concentrating or making decisions? 0  Walking or climbing stairs? 0  Dressing or bathing? 0  Doing errands, shopping? 0  Preparing Food and eating ? N  Using the Toilet? N  In the past six months, have you accidently leaked urine? N  Do you have problems with loss of bowel control? N  Managing your Medications? N  Managing your Finances? N  Housekeeping or managing your Housekeeping? N    Patient Care Team: Jeani Sow, MD as PCP - General (Family Medicine) Almond Lint, MD as Consulting Physician (General Surgery) Antony Blackbird, MD as Consulting Physician (Radiation Oncology) Naida Sleight, MD as Referring Physician Armbruster, Willaim Rayas, MD as Consulting Physician (Gastroenterology) Axel Filler Larna Daughters, NP as Nurse Practitioner (Hematology and Oncology) Rachel Moulds, MD as Consulting Physician (Hematology and Oncology)  Indicate any recent Medical Services you may have received from other than [facility] providers in the past year (date may be approximate).     Assessment:   This is a routine wellness examination for Lydianna.  Hearing/Vision screen Hearing Screening - Comments:: Pt denies any hearing issues  Vision Screening - Comments::  Pt follows up with Dr Lorin Picket for annual eye exams  Dietary issues and exercise activities discussed:     Goals Addressed             This Visit's Progress    Patient Stated       Stay active and healthy        Depression Screen    11/19/2022    3:09 PM 09/10/2022   11:30  AM 10/30/2021    1:14 PM 10/13/2021   10:29 AM 09/17/2019     7:44 AM 09/08/2019    1:21 PM 05/08/2018   10:06 AM  PHQ 2/9 Scores  PHQ - 2 Score 0 0 0 0 0 0 0  PHQ- 9 Score  1  0 1 1 2     Fall Risk    11/15/2022    2:17 PM 09/10/2022   11:30 AM 10/30/2021    1:18 PM 10/13/2021   10:29 AM 09/08/2019    1:21 PM  Fall Risk   Falls in the past year? 0 0 0 0 0  Number falls in past yr:  0 0 0   Injury with Fall? 0 0 0 0   Risk for fall due to : Impaired vision No Fall Risks Impaired vision No Fall Risks   Follow up Falls prevention discussed Falls evaluation completed Falls prevention discussed Falls evaluation completed Falls evaluation completed    MEDICARE RISK AT HOME:   TIMED UP AND GO:  Was the test performed?  No    Cognitive Function:        11/19/2022    3:11 PM 10/30/2021    1:20 PM  6CIT Screen  What Year? 0 points 0 points  What month? 0 points 0 points  What time? 0 points 0 points  Count back from 20 0 points 0 points  Months in reverse 0 points 0 points  Repeat phrase 0 points 0 points  Total Score 0 points 0 points    Immunizations Immunization History  Administered Date(s) Administered   Fluad Quad(high Dose 65+) 02/04/2020   Influenza Split 03/24/2009, 02/18/2014, 03/08/2015   Influenza,inj,Quad PF,6+ Mos 02/18/2014, 02/13/2017, 01/14/2018, 02/10/2019   Influenza-Unspecified 03/24/2009, 03/08/2015, 02/23/2022   Moderna Sars-Covid-2 Vaccination 06/15/2019, 07/13/2019, 03/14/2020   PNEUMOCOCCAL CONJUGATE-20 09/19/2020   Pfizer Covid-19 Vaccine Bivalent Booster 29yrs & up 07/27/2021   Td 05/17/1999   Td (Adult),5 Lf Tetanus Toxid, Preservative Free 05/17/1999   Tdap 10/30/2010, 03/23/2021   Zoster Recombinant(Shingrix) 05/01/2018, 07/11/2018   Zoster, Live 02/15/2015    TDAP status: Up to date  Flu Vaccine status: Up to date  Pneumococcal vaccine status: Up to date  Covid-19 vaccine status: Completed vaccines  Qualifies for Shingles Vaccine? Yes   Zostavax completed Yes   Shingrix Completed?:  Yes  Screening Tests Health Maintenance  Topic Date Due   COVID-19 Vaccine (5 - 2023-24 season) 12/31/2022 (Originally 01/19/2022)   INFLUENZA VACCINE  12/20/2022   Medicare Annual Wellness (AWV)  11/19/2023   MAMMOGRAM  08/05/2024   Colonoscopy  05/31/2027   DTaP/Tdap/Td (5 - Td or Tdap) 03/24/2031   Pneumonia Vaccine 68+ Years old  Completed   DEXA SCAN  Completed   Hepatitis C Screening  Completed   Zoster Vaccines- Shingrix  Completed   HPV VACCINES  Aged Out    Health Maintenance  There are no preventive care reminders to display for this patient.   Colorectal cancer screening: Type of screening: Colonoscopy. Completed 05/30/22. Repeat every 5 years  Mammogram status: Completed 08/06/22. Repeat every year  Bone Density status: Completed 04/05/22. Results reflect: Bone density results: OSTEOPENIA. Repeat every 2 years.   Additional Screening:  Hepatitis C Screening: Completed 08/04/15  Vision Screening: Recommended annual ophthalmology exams for early detection of glaucoma and other disorders of the eye. Is the patient up to date with their annual eye exam?  Yes  Who is the provider or what is the name of the office  in which the patient attends annual eye exams? Dr Fredrich Birks  If pt is not established with a provider, would they like to be referred to a provider to establish care? No .   Dental Screening: Recommended annual dental exams for proper oral hygiene  Community Resource Referral / Chronic Care Management: CRR required this visit?  No   CCM required this visit?  No     Plan:     I have personally reviewed and noted the following in the patient's chart:   Medical and social history Use of alcohol, tobacco or illicit drugs  Current medications and supplements including opioid prescriptions. Patient is not currently taking opioid prescriptions. Functional ability and status Nutritional status Physical activity Advanced directives List of other  physicians Hospitalizations, surgeries, and ER visits in previous 12 months Vitals Screenings to include cognitive, depression, and falls Referrals and appointments  In addition, I have reviewed and discussed with patient certain preventive protocols, quality metrics, and best practice recommendations. A written personalized care plan for preventive services as well as general preventive health recommendations were provided to patient.     Marzella Schlein, LPN   05/26/1094   After Visit Summary: (MyChart) Due to this being a telephonic visit, the after visit summary with patients personalized plan was offered to patient via MyChart   Nurse Notes: none

## 2022-12-16 ENCOUNTER — Encounter: Payer: Self-pay | Admitting: Orthopaedic Surgery

## 2022-12-17 ENCOUNTER — Other Ambulatory Visit: Payer: Self-pay | Admitting: Family Medicine

## 2022-12-20 NOTE — Pre-Procedure Instructions (Signed)
Surgical Instructions   Your procedure is scheduled on December 31, 2022. Report to [facility] Main Entrance "A" at 5:30 A.M., then check in with the Admitting office. Any questions or running late day of surgery: call [phone]  Questions prior to your surgery date: call [phone], Monday-Friday, 8am-4pm. If you experience any cold or flu symptoms such as cough, fever, chills, shortness of breath, etc. between now and your scheduled surgery, please notify us at the above number.     Remember:  Do not eat after midnight the night before your surgery  You may drink clear liquids until 4:15 AM the morning of your surgery.   Clear liquids allowed are: Water, Non-Citrus Juices (without pulp), Carbonated Beverages, Clear Tea, Black Coffee Only (NO MILK, CREAM OR POWDERED CREAMER of any kind), and Gatorade.  Patient Instructions  The night before surgery:  No food after midnight. ONLY clear liquids after midnight  The day of surgery (if you do NOT have diabetes):  Drink ONE (1) Pre-Surgery Clear Ensure by 4:15 AM the morning of surgery. Drink in one sitting. Do not sip.  This drink was given to you during your hospital  pre-op appointment visit.  Nothing else to drink after completing the  Pre-Surgery Clear Ensure.         If you have questions, please contact your surgeon's office.     Take these medicines the morning of surgery with A SIP OF WATER: cetirizine (ZYRTEC) - may take if needed   One week prior to surgery, STOP taking any Aspirin (unless otherwise instructed by your surgeon) Aleve, Naproxen, Ibuprofen, Motrin, Advil, Goody's, BC's, all herbal medications, fish oil, and non-prescription vitamins.                     Do NOT Smoke (Tobacco/Vaping) for 24 hours prior to your procedure.  If you use a CPAP at night, you may bring your mask/headgear for your overnight stay.   You will be asked to remove any contacts, glasses, piercing's, hearing aid's,  dentures/partials prior to surgery. Please bring cases for these items if needed.    Patients discharged the day of surgery will not be allowed to drive home, and someone needs to stay with them for 24 hours.  SURGICAL WAITING ROOM VISITATION Patients may have no more than 2 support people in the waiting area - these visitors may rotate.   Pre-op nurse will coordinate an appropriate time for 1 ADULT support person, who may not rotate, to accompany patient in pre-op.  Children under the age of 35 must have an adult with them who is not the patient and must remain in the main waiting area with an adult.  If the patient needs to stay at the hospital during part of their recovery, the visitor guidelines for inpatient rooms apply.  Please refer to the [facility] website for the visitor guidelines for any additional information.   If you received a COVID test during your pre-op visit  it is requested that you wear a mask when out in public, stay away from anyone that may not be feeling well and notify your surgeon if you develop symptoms. If you have been in contact with anyone that has tested positive in the last 10 days please notify you surgeon.      Pre-operative 5 CHG Bathing Instructions   You can play a key role in reducing the risk of infection after surgery. Your skin needs to be as free of germs  as possible. You can reduce the number of germs on your skin by washing with CHG (chlorhexidine gluconate) soap before surgery. CHG is an antiseptic soap that kills germs and continues to kill germs even after washing.   DO NOT use if you have an allergy to chlorhexidine/CHG or antibacterial soaps. If your skin becomes reddened or irritated, stop using the CHG and notify one of our RNs at [phone].   Please shower with the CHG soap starting 4 days before surgery using the following schedule:     Please keep in mind the following:  DO NOT shave, including legs and underarms, starting the  day of your first shower.   You may shave your face at any point before/day of surgery.  Place clean sheets on your bed the day you start using CHG soap. Use a clean washcloth (not used since being washed) for each shower. DO NOT sleep with pets once you start using the CHG.   CHG Shower Instructions:  If you choose to wash your hair and private area, wash first with your normal shampoo/soap.  After you use shampoo/soap, rinse your hair and body thoroughly to remove shampoo/soap residue.  Turn the water OFF and apply about 3 tablespoons (45 ml) of CHG soap to a CLEAN washcloth.  Apply CHG soap ONLY FROM YOUR NECK DOWN TO YOUR TOES (washing for 3-5 minutes)  DO NOT use CHG soap on face, private areas, open wounds, or sores.  Pay special attention to the area where your surgery is being performed.  If you are having back surgery, having someone wash your back for you may be helpful. Wait 2 minutes after CHG soap is applied, then you may rinse off the CHG soap.  Pat dry with a clean towel  Put on clean clothes/pajamas   If you choose to wear lotion, please use ONLY the CHG-compatible lotions on the back of this paper.   Additional instructions for the day of surgery: DO NOT APPLY any lotions, deodorants, cologne, or perfumes.   Do not bring valuables to the hospital. [facility] is not responsible for any belongings/valuables. Do not wear nail polish, gel polish, artificial nails, or any other type of covering on natural nails (fingers and toes) Do not wear jewelry or makeup Put on clean/comfortable clothes.  Please brush your teeth.  Ask your nurse before applying any prescription medications to the skin.     CHG Compatible Lotions   Aveeno Moisturizing lotion  Cetaphil Moisturizing Cream  Cetaphil Moisturizing Lotion  Clairol Herbal Essence Moisturizing Lotion, Dry Skin  Clairol Herbal Essence Moisturizing Lotion, Extra Dry Skin  Clairol Herbal Essence Moisturizing Lotion, Normal  Skin  Curel Age Defying Therapeutic Moisturizing Lotion with Alpha Hydroxy  Curel Extreme Care Body Lotion  Curel Soothing Hands Moisturizing Hand Lotion  Curel Therapeutic Moisturizing Cream, Fragrance-Free  Curel Therapeutic Moisturizing Lotion, Fragrance-Free  Curel Therapeutic Moisturizing Lotion, Original Formula  Eucerin Daily Replenishing Lotion  Eucerin Dry Skin Therapy Plus Alpha Hydroxy Crme  Eucerin Dry Skin Therapy Plus Alpha Hydroxy Lotion  Eucerin Original Crme  Eucerin Original Lotion  Eucerin Plus Crme Eucerin Plus Lotion  Eucerin TriLipid Replenishing Lotion  Keri Anti-Bacterial Hand Lotion  Keri Deep Conditioning Original Lotion Dry Skin Formula Softly Scented  Keri Deep Conditioning Original Lotion, Fragrance Free Sensitive Skin Formula  Keri Lotion Fast Absorbing Fragrance Free Sensitive Skin Formula  Keri Lotion Fast Absorbing Softly Scented Dry Skin Formula  Keri Original Lotion  Keri Skin Renewal Lotion [place]  Smooth Lotion  Keri Silky Smooth Sensitive Skin Lotion  Nivea Body Creamy Conditioning Oil  Nivea Body Extra Enriched Lotion  Nivea Body Original Lotion  Nivea Body Sheer Moisturizing Lotion Nivea Crme  Nivea Skin Firming Lotion  NutraDerm 30 Skin Lotion  NutraDerm Skin Lotion  NutraDerm Therapeutic Skin Cream  NutraDerm Therapeutic Skin Lotion  ProShield Protective Hand Cream  Provon moisturizing lotion  Please read over the following fact sheets that you were given.

## 2022-12-21 ENCOUNTER — Encounter (HOSPITAL_COMMUNITY)
Admission: RE | Admit: 2022-12-21 | Discharge: 2022-12-21 | Disposition: A | Discharge status: Home / Self Care | Payer: Medicare Other | Source: Ambulatory Visit | Attending: Orthopaedic Surgery | Admitting: Orthopaedic Surgery

## 2022-12-21 ENCOUNTER — Encounter (HOSPITAL_COMMUNITY): Payer: Self-pay

## 2022-12-21 ENCOUNTER — Encounter: Payer: Self-pay | Admitting: Orthopaedic Surgery

## 2022-12-21 ENCOUNTER — Other Ambulatory Visit: Payer: Self-pay

## 2022-12-21 VITALS — BP 121/57 | HR 67 | Temp 98.5°F | Resp 17 | Ht 65.0 in | Wt 158.6 lb

## 2022-12-21 DIAGNOSIS — Z01812 Encounter for preprocedural laboratory examination: Secondary | ICD-10-CM | POA: Diagnosis not present

## 2022-12-21 DIAGNOSIS — Z01818 Encounter for other preprocedural examination: Secondary | ICD-10-CM

## 2022-12-21 DIAGNOSIS — M1612 Unilateral primary osteoarthritis, left hip: Secondary | ICD-10-CM | POA: Insufficient documentation

## 2022-12-21 HISTORY — DX: Pneumonia, unspecified organism: J18.9

## 2022-12-21 HISTORY — DX: Depression, unspecified: F32.A

## 2022-12-21 LAB — TYPE AND SCREEN
ABO/RH(D): O POS
Antibody Screen: NEGATIVE

## 2022-12-21 LAB — BASIC METABOLIC PANEL
Anion gap: 8 (ref 5–15)
BUN: 13 mg/dL (ref 8–23)
CO2: 26 mmol/L (ref 22–32)
Calcium: 9.3 mg/dL (ref 8.9–10.3)
Chloride: 102 mmol/L (ref 98–111)
Creatinine, Ser: 0.82 mg/dL (ref 0.44–1.00)
GFR, Estimated: >60 mL/min (ref >60)
Glucose, Bld: 118 mg/dL — ABNORMAL HIGH (ref 70–99)
Potassium: 4.2 mmol/L (ref 3.5–5.1)
Sodium: 136 mmol/L (ref 135–145)

## 2022-12-21 LAB — CBC
HCT: 39.2 % (ref 36.0–46.0)
Hemoglobin: 13.1 g/dL (ref 12.0–15.0)
MCH: 31.9 pg (ref 26.0–34.0)
MCHC: 33.4 g/dL (ref 30.0–36.0)
MCV: 95.4 fL (ref 80.0–100.0)
Platelets: 359 10*3/uL (ref 150–400)
RBC: 4.11 MIL/uL (ref 3.87–5.11)
RDW: 12.3 % (ref 11.5–15.5)
WBC: 8.3 10*3/uL (ref 4.0–10.5)
nRBC: 0 % (ref 0.0–0.2)

## 2022-12-21 LAB — SURGICAL PCR SCREEN
MRSA, PCR: NEGATIVE
Staphylococcus aureus: POSITIVE — AB

## 2022-12-21 NOTE — Progress Notes (Addendum)
PCP - Dr. Jeani Sow Cardiologist - Dr. Marca Ancona while receiving Chemotherapy for breast cancer in 2018/2019. PRN follow-up  PPM/ICD - Denies Device Orders - n/a Rep Notified - n/a  Chest x-ray - Denies EKG - 08/06/2017 - related to chemotherapy Stress Test - Denies ECHO - 08/06/2017 Cardiac Cath - Denies  Sleep Study - Denies CPAP - n/a  No DM  Last dose of GLP1 agonist- n/a GLP1 instructions: n/a  Blood Thinner Instructions: n/a Aspirin Instructions: n/a  ERAS Protcol - Clear liquids until 0415 morning of surgery PRE-SURGERY Ensure or G2- Ensure given to pt with instructions  COVID TEST- n/a   Anesthesia review: No.   Patient denies shortness of breath, fever, cough and chest pain at PAT appointment. Pt denies any respiratory illness/infection in the last two months.   All instructions explained to the patient, with a verbal understanding of the material. Patient agrees to go over the instructions while at home for a better understanding. Patient also instructed to self quarantine after being tested for COVID-19. The opportunity to ask questions was provided.

## 2022-12-24 ENCOUNTER — Other Ambulatory Visit: Payer: Self-pay | Admitting: Physician Assistant

## 2022-12-24 ENCOUNTER — Telehealth: Payer: Self-pay | Admitting: Orthopaedic Surgery

## 2022-12-24 DIAGNOSIS — M1612 Unilateral primary osteoarthritis, left hip: Secondary | ICD-10-CM

## 2022-12-24 MED ORDER — ONDANSETRON HCL 4 MG PO TABS: 4.0000 mg | ORAL_TABLET | Freq: Three times a day (TID) | ORAL | 0 refills | Status: DC | PRN

## 2022-12-24 MED ORDER — ENOXAPARIN SODIUM 40 MG/0.4ML IJ SOSY: 40.0000 mg | PREFILLED_SYRINGE | INTRAMUSCULAR | 0 refills | Status: DC

## 2022-12-24 MED ORDER — OXYCODONE-ACETAMINOPHEN 5-325 MG PO TABS: 1.0000 | ORAL_TABLET | Freq: Four times a day (QID) | ORAL | 0 refills | Status: DC | PRN

## 2022-12-24 MED ORDER — METHOCARBAMOL 750 MG PO TABS: 750.0000 mg | ORAL_TABLET | Freq: Two times a day (BID) | ORAL | 2 refills | Status: DC | PRN

## 2022-12-24 MED ORDER — DOCUSATE SODIUM 100 MG PO CAPS: 100.0000 mg | ORAL_CAPSULE | Freq: Every day | ORAL | 2 refills | Status: DC | PRN

## 2022-12-24 NOTE — Telephone Encounter (Signed)
no

## 2022-12-24 NOTE — Telephone Encounter (Signed)
Roda Shutters, I don't see a problem continuing this.  What do you think?

## 2022-12-24 NOTE — Telephone Encounter (Signed)
Sounds good

## 2022-12-24 NOTE — Telephone Encounter (Signed)
Ok to continue

## 2022-12-24 NOTE — Telephone Encounter (Signed)
Pt has questions about preop testing and surgery please advise

## 2022-12-25 ENCOUNTER — Encounter: Payer: Self-pay | Admitting: Orthopaedic Surgery

## 2022-12-28 ENCOUNTER — Telehealth: Payer: Self-pay | Admitting: *Deleted

## 2022-12-28 MED ORDER — TRANEXAMIC ACID 1000 MG/10ML IV SOLN
2000.0000 mg | INTRAVENOUS | Status: AC
  Filled 2022-12-28: qty 20

## 2022-12-28 NOTE — Telephone Encounter (Signed)
Ortho bundle pre-op call completed. 

## 2022-12-28 NOTE — Care Plan (Signed)
[facility] RNCM call to patient to discuss her upcoming Left total hip arthroplasty with Dr. Roda Shutters on Monday, 12/31/22. She is an Ortho bundle patient through [facility] and is agreeable to case management. She lives with her spouse, who will be assisting after discharge. She will need a RW at hospital. Anticipate HHPT will be needed after a short hospital stay. Referral made to [facility] after choice provided. Reviewed all post op care instructions. Will continue to follow for needs.

## 2022-12-31 ENCOUNTER — Observation Stay (HOSPITAL_COMMUNITY)
Admission: RE | Admit: 2022-12-31 | Discharge: 2023-01-01 | Disposition: A | Discharge status: Home / Self Care | Payer: Medicare Other | Source: Ambulatory Visit | Attending: Orthopaedic Surgery | Admitting: Orthopaedic Surgery

## 2022-12-31 ENCOUNTER — Ambulatory Visit (HOSPITAL_BASED_OUTPATIENT_CLINIC_OR_DEPARTMENT_OTHER): Payer: Medicare Other | Admitting: Registered Nurse

## 2022-12-31 ENCOUNTER — Observation Stay (HOSPITAL_COMMUNITY): Payer: Medicare Other

## 2022-12-31 ENCOUNTER — Other Ambulatory Visit: Payer: Self-pay

## 2022-12-31 ENCOUNTER — Encounter (HOSPITAL_COMMUNITY)
Admission: RE | Disposition: A | Discharge status: Home / Self Care | Payer: Self-pay | Source: Ambulatory Visit | Attending: Orthopaedic Surgery

## 2022-12-31 ENCOUNTER — Encounter (HOSPITAL_COMMUNITY): Payer: Self-pay | Admitting: Orthopaedic Surgery

## 2022-12-31 ENCOUNTER — Ambulatory Visit (HOSPITAL_COMMUNITY): Payer: Medicare Other

## 2022-12-31 ENCOUNTER — Ambulatory Visit (HOSPITAL_COMMUNITY): Payer: Medicare Other | Admitting: Registered Nurse

## 2022-12-31 DIAGNOSIS — M1612 Unilateral primary osteoarthritis, left hip: Secondary | ICD-10-CM | POA: Diagnosis not present

## 2022-12-31 DIAGNOSIS — Z853 Personal history of malignant neoplasm of breast: Secondary | ICD-10-CM | POA: Diagnosis not present

## 2022-12-31 DIAGNOSIS — Z85828 Personal history of other malignant neoplasm of skin: Secondary | ICD-10-CM | POA: Insufficient documentation

## 2022-12-31 DIAGNOSIS — Z96642 Presence of left artificial hip joint: Secondary | ICD-10-CM

## 2022-12-31 HISTORY — PX: TOTAL HIP ARTHROPLASTY: SHX124

## 2022-12-31 LAB — TYPE AND SCREEN
ABO/RH(D): O POS
Antibody Screen: NEGATIVE

## 2022-12-31 SURGERY — ARTHROPLASTY, HIP, TOTAL, ANTERIOR APPROACH
Anesthesia: Monitor Anesthesia Care | Site: Hip | Laterality: Left

## 2022-12-31 MED ORDER — CHLORHEXIDINE GLUCONATE 0.12 % MT SOLN
OROMUCOSAL | Status: AC
  Administered 2022-12-31: 15 mL via OROMUCOSAL
  Filled 2022-12-31: qty 15

## 2022-12-31 MED ORDER — FERROUS SULFATE 325 (65 FE) MG PO TABS
325.0000 mg | ORAL_TABLET | Freq: Three times a day (TID) | ORAL | Status: DC
  Administered 2022-12-31 – 2023-01-01 (×3): 325 mg via ORAL
  Filled 2022-12-31 (×3): qty 1

## 2022-12-31 MED ORDER — OXYCODONE HCL 5 MG/5ML PO SOLN: 5.0000 mg | Freq: Once | ORAL | Status: AC | PRN

## 2022-12-31 MED ORDER — DIPHENHYDRAMINE HCL 12.5 MG/5ML PO ELIX: 25.0000 mg | ORAL_SOLUTION | ORAL | Status: DC | PRN

## 2022-12-31 MED ORDER — LACTATED RINGERS IV SOLN: INTRAVENOUS | Status: DC

## 2022-12-31 MED ORDER — DEXAMETHASONE SODIUM PHOSPHATE 10 MG/ML IJ SOLN
INTRAMUSCULAR | Status: DC | PRN
  Administered 2022-12-31: 10 mg via INTRAVENOUS

## 2022-12-31 MED ORDER — BUPIVACAINE IN DEXTROSE 0.75-8.25 % IT SOLN
INTRATHECAL | Status: DC | PRN
Start: 2022-12-31 — End: 2022-12-31
  Administered 2022-12-31: 1.6 mL via INTRATHECAL

## 2022-12-31 MED ORDER — HYDROXYZINE HCL 50 MG/ML IM SOLN: 50.0000 mg | Freq: Four times a day (QID) | INTRAMUSCULAR | Status: DC | PRN

## 2022-12-31 MED ORDER — DEXAMETHASONE SODIUM PHOSPHATE 10 MG/ML IJ SOLN
10.0000 mg | Freq: Once | INTRAMUSCULAR | Status: AC
  Administered 2023-01-01: 10 mg via INTRAVENOUS
  Filled 2022-12-31: qty 1

## 2022-12-31 MED ORDER — TRANEXAMIC ACID 1000 MG/10ML IV SOLN
INTRAVENOUS | Status: DC | PRN
  Administered 2022-12-31: 2000 mg via TOPICAL

## 2022-12-31 MED ORDER — HYDROXYZINE HCL 50 MG/ML IM SOLN
50.0000 mg | Freq: Four times a day (QID) | INTRAMUSCULAR | Status: DC | PRN
  Administered 2022-12-31: 50 mg via INTRAMUSCULAR
  Filled 2022-12-31: qty 1

## 2022-12-31 MED ORDER — METOCLOPRAMIDE HCL 5 MG/ML IJ SOLN
5.0000 mg | Freq: Three times a day (TID) | INTRAMUSCULAR | Status: DC | PRN
  Administered 2022-12-31: 10 mg via INTRAVENOUS
  Filled 2022-12-31: qty 2

## 2022-12-31 MED ORDER — OXYCODONE HCL 5 MG PO TABS
5.0000 mg | ORAL_TABLET | Freq: Once | ORAL | Status: AC | PRN
  Administered 2022-12-31: 5 mg via ORAL

## 2022-12-31 MED ORDER — HYDROCODONE-ACETAMINOPHEN 5-325 MG PO TABS
1.0000 | ORAL_TABLET | Freq: Three times a day (TID) | ORAL | Status: DC | PRN
  Administered 2022-12-31 (×2): 1 via ORAL
  Filled 2022-12-31: qty 1

## 2022-12-31 MED ORDER — OXYCODONE HCL 5 MG PO TABS: 10.0000 mg | ORAL_TABLET | ORAL | Status: DC | PRN

## 2022-12-31 MED ORDER — PRONTOSAN WOUND IRRIGATION OPTIME
TOPICAL | Status: DC | PRN
  Administered 2022-12-31: 1

## 2022-12-31 MED ORDER — MAGNESIUM CITRATE PO SOLN: 1.0000 | Freq: Once | ORAL | Status: DC | PRN

## 2022-12-31 MED ORDER — PANTOPRAZOLE SODIUM 40 MG PO TBEC
40.0000 mg | DELAYED_RELEASE_TABLET | Freq: Every day | ORAL | Status: DC
  Administered 2022-12-31 – 2023-01-01 (×2): 40 mg via ORAL
  Filled 2022-12-31 (×2): qty 1

## 2022-12-31 MED ORDER — PROPOFOL 500 MG/50ML IV EMUL
INTRAVENOUS | Status: DC | PRN
  Administered 2022-12-31: 120 ug/kg/min via INTRAVENOUS
  Administered 2022-12-31: 30 mg via INTRAVENOUS

## 2022-12-31 MED ORDER — ENOXAPARIN SODIUM 40 MG/0.4ML IJ SOSY
40.0000 mg | PREFILLED_SYRINGE | INTRAMUSCULAR | Status: DC
  Administered 2023-01-01: 40 mg via SUBCUTANEOUS
  Filled 2022-12-31: qty 0.4

## 2022-12-31 MED ORDER — FENTANYL CITRATE (PF) 250 MCG/5ML IJ SOLN
INTRAMUSCULAR | Status: DC | PRN
  Administered 2022-12-31 (×2): 50 ug via INTRAVENOUS

## 2022-12-31 MED ORDER — OXYCODONE HCL 5 MG PO TABS
ORAL_TABLET | ORAL | Status: AC
  Filled 2022-12-31: qty 1

## 2022-12-31 MED ORDER — METHOCARBAMOL 1000 MG/10ML IJ SOLN: 500.0000 mg | Freq: Four times a day (QID) | INTRAVENOUS | Status: DC | PRN

## 2022-12-31 MED ORDER — ALBUMIN HUMAN 5 % IV SOLN: INTRAVENOUS | Status: DC | PRN

## 2022-12-31 MED ORDER — POLYETHYLENE GLYCOL 3350 17 G PO PACK
17.0000 g | PACK | Freq: Every day | ORAL | Status: DC
  Administered 2022-12-31 – 2023-01-01 (×2): 17 g via ORAL
  Filled 2022-12-31 (×2): qty 1

## 2022-12-31 MED ORDER — SORBITOL 70 % SOLN: 30.0000 mL | Freq: Every day | Status: DC | PRN

## 2022-12-31 MED ORDER — BUPIVACAINE-MELOXICAM ER 400-12 MG/14ML IJ SOLN
INTRAMUSCULAR | Status: AC
  Filled 2022-12-31: qty 1

## 2022-12-31 MED ORDER — FENTANYL CITRATE (PF) 100 MCG/2ML IJ SOLN: 25.0000 ug | INTRAMUSCULAR | Status: DC | PRN

## 2022-12-31 MED ORDER — CEFAZOLIN SODIUM-DEXTROSE 2-4 GM/100ML-% IV SOLN
INTRAVENOUS | Status: AC
  Filled 2022-12-31: qty 100

## 2022-12-31 MED ORDER — BUPIVACAINE-MELOXICAM ER 400-12 MG/14ML IJ SOLN
INTRAMUSCULAR | Status: DC | PRN
  Administered 2022-12-31: 400 mg

## 2022-12-31 MED ORDER — CEFAZOLIN SODIUM-DEXTROSE 2-4 GM/100ML-% IV SOLN
2.0000 g | INTRAVENOUS | Status: AC
  Administered 2022-12-31: 2 g via INTRAVENOUS

## 2022-12-31 MED ORDER — ASPIRIN 81 MG PO CHEW
81.0000 mg | CHEWABLE_TABLET | Freq: Once | ORAL | Status: AC
  Administered 2022-12-31: 81 mg via ORAL
  Filled 2022-12-31: qty 1

## 2022-12-31 MED ORDER — OXYCODONE HCL 5 MG PO TABS
5.0000 mg | ORAL_TABLET | ORAL | Status: DC | PRN
  Administered 2023-01-01 (×3): 5 mg via ORAL
  Filled 2022-12-31: qty 2
  Filled 2022-12-31: qty 1
  Filled 2022-12-31 (×2): qty 2

## 2022-12-31 MED ORDER — METOCLOPRAMIDE HCL 5 MG PO TABS: 5.0000 mg | ORAL_TABLET | Freq: Three times a day (TID) | ORAL | Status: DC | PRN

## 2022-12-31 MED ORDER — PHENYLEPHRINE HCL-NACL 20-0.9 MG/250ML-% IV SOLN
INTRAVENOUS | Status: DC | PRN
  Administered 2022-12-31: 120 ug via INTRAVENOUS
  Administered 2022-12-31: 50 ug/min via INTRAVENOUS
  Administered 2022-12-31: 120 ug via INTRAVENOUS

## 2022-12-31 MED ORDER — METHOCARBAMOL 500 MG PO TABS: 500.0000 mg | ORAL_TABLET | Freq: Four times a day (QID) | ORAL | Status: DC | PRN

## 2022-12-31 MED ORDER — HYDROMORPHONE HCL 1 MG/ML IJ SOLN
0.5000 mg | INTRAMUSCULAR | Status: DC | PRN
  Administered 2022-12-31: 1 mg via INTRAVENOUS
  Filled 2022-12-31: qty 1

## 2022-12-31 MED ORDER — VANCOMYCIN HCL 1000 MG IV SOLR
INTRAVENOUS | Status: AC
  Filled 2022-12-31: qty 20

## 2022-12-31 MED ORDER — MENTHOL 3 MG MT LOZG: 1.0000 | LOZENGE | OROMUCOSAL | Status: DC | PRN

## 2022-12-31 MED ORDER — ALUM & MAG HYDROXIDE-SIMETH 200-200-20 MG/5ML PO SUSP: 30.0000 mL | ORAL | Status: DC | PRN

## 2022-12-31 MED ORDER — ONDANSETRON HCL 4 MG/2ML IJ SOLN: 4.0000 mg | Freq: Four times a day (QID) | INTRAMUSCULAR | Status: DC | PRN

## 2022-12-31 MED ORDER — ACETAMINOPHEN 325 MG PO TABS: 325.0000 mg | ORAL_TABLET | Freq: Four times a day (QID) | ORAL | Status: DC | PRN

## 2022-12-31 MED ORDER — OXYCODONE HCL ER 10 MG PO T12A
10.0000 mg | EXTENDED_RELEASE_TABLET | Freq: Two times a day (BID) | ORAL | Status: DC
  Administered 2022-12-31 (×2): 10 mg via ORAL
  Filled 2022-12-31 (×2): qty 1

## 2022-12-31 MED ORDER — TRANEXAMIC ACID-NACL 1000-0.7 MG/100ML-% IV SOLN
INTRAVENOUS | Status: AC
  Filled 2022-12-31: qty 100

## 2022-12-31 MED ORDER — CEFAZOLIN SODIUM-DEXTROSE 2-4 GM/100ML-% IV SOLN
2.0000 g | Freq: Four times a day (QID) | INTRAVENOUS | Status: AC
  Administered 2022-12-31 (×2): 2 g via INTRAVENOUS
  Filled 2022-12-31 (×2): qty 100

## 2022-12-31 MED ORDER — MIDAZOLAM HCL 2 MG/2ML IJ SOLN
INTRAMUSCULAR | Status: DC | PRN
  Administered 2022-12-31: 2 mg via INTRAVENOUS

## 2022-12-31 MED ORDER — ONDANSETRON HCL 4 MG/2ML IJ SOLN
4.0000 mg | Freq: Four times a day (QID) | INTRAMUSCULAR | Status: DC | PRN
  Administered 2022-12-31: 4 mg via INTRAVENOUS
  Filled 2022-12-31: qty 2

## 2022-12-31 MED ORDER — POVIDONE-IODINE 10 % EX SWAB
2.0000 | Freq: Once | CUTANEOUS | Status: AC
  Administered 2022-12-31: 2 via TOPICAL

## 2022-12-31 MED ORDER — SODIUM CHLORIDE 0.9 % IR SOLN
Status: DC | PRN
  Administered 2022-12-31: 1000 mL

## 2022-12-31 MED ORDER — SODIUM CHLORIDE 0.9 % IV SOLN: INTRAVENOUS | Status: DC

## 2022-12-31 MED ORDER — TRANEXAMIC ACID-NACL 1000-0.7 MG/100ML-% IV SOLN
1000.0000 mg | INTRAVENOUS | Status: AC
  Administered 2022-12-31: 1000 mg via INTRAVENOUS

## 2022-12-31 MED ORDER — METHOCARBAMOL 500 MG PO TABS: 500.0000 mg | ORAL_TABLET | Freq: Three times a day (TID) | ORAL | Status: DC | PRN

## 2022-12-31 MED ORDER — DOCUSATE SODIUM 100 MG PO CAPS
100.0000 mg | ORAL_CAPSULE | Freq: Two times a day (BID) | ORAL | Status: DC
  Administered 2022-12-31 – 2023-01-01 (×3): 100 mg via ORAL
  Filled 2022-12-31 (×3): qty 1

## 2022-12-31 MED ORDER — 0.9 % SODIUM CHLORIDE (POUR BTL) OPTIME
TOPICAL | Status: DC | PRN
  Administered 2022-12-31: 1000 mL

## 2022-12-31 MED ORDER — METHOCARBAMOL 500 MG PO TABS
500.0000 mg | ORAL_TABLET | Freq: Four times a day (QID) | ORAL | Status: DC | PRN
  Administered 2023-01-01 (×2): 500 mg via ORAL
  Filled 2022-12-31 (×3): qty 1

## 2022-12-31 MED ORDER — MIDAZOLAM HCL 2 MG/2ML IJ SOLN
INTRAMUSCULAR | Status: AC
  Filled 2022-12-31: qty 2

## 2022-12-31 MED ORDER — ONDANSETRON HCL 4 MG PO TABS: 4.0000 mg | ORAL_TABLET | Freq: Four times a day (QID) | ORAL | Status: DC | PRN

## 2022-12-31 MED ORDER — PHENOL 1.4 % MT LIQD: 1.0000 | OROMUCOSAL | Status: DC | PRN

## 2022-12-31 MED ORDER — ACETAMINOPHEN 500 MG PO TABS
1000.0000 mg | ORAL_TABLET | Freq: Four times a day (QID) | ORAL | Status: AC
  Administered 2022-12-31 – 2023-01-01 (×4): 1000 mg via ORAL
  Filled 2022-12-31 (×4): qty 2

## 2022-12-31 MED ORDER — TRANEXAMIC ACID-NACL 1000-0.7 MG/100ML-% IV SOLN
1000.0000 mg | Freq: Once | INTRAVENOUS | Status: AC
  Administered 2022-12-31: 1000 mg via INTRAVENOUS
  Filled 2022-12-31: qty 100

## 2022-12-31 MED ORDER — FENTANYL CITRATE (PF) 250 MCG/5ML IJ SOLN
INTRAMUSCULAR | Status: AC
  Filled 2022-12-31: qty 5

## 2022-12-31 MED ORDER — CHLORHEXIDINE GLUCONATE 0.12 % MT SOLN: 15.0000 mL | Freq: Once | OROMUCOSAL | Status: AC

## 2022-12-31 MED ORDER — METHOCARBAMOL 500 MG PO TABS
500.0000 mg | ORAL_TABLET | Freq: Three times a day (TID) | ORAL | Status: DC | PRN
  Administered 2022-12-31: 500 mg via ORAL
  Filled 2022-12-31 (×2): qty 1

## 2022-12-31 MED ORDER — VANCOMYCIN HCL 1 G IV SOLR
INTRAVENOUS | Status: DC | PRN
  Administered 2022-12-31: 1000 mg via TOPICAL

## 2022-12-31 MED ORDER — ONDANSETRON HCL 4 MG/2ML IJ SOLN
INTRAMUSCULAR | Status: DC | PRN
  Administered 2022-12-31: 4 mg via INTRAVENOUS

## 2022-12-31 MED ORDER — ROPINIROLE HCL 0.25 MG PO TABS
0.2500 mg | ORAL_TABLET | Freq: Every day | ORAL | Status: DC
  Administered 2022-12-31: 0.25 mg via ORAL
  Filled 2022-12-31 (×2): qty 1

## 2022-12-31 MED ORDER — ORAL CARE MOUTH RINSE: 15.0000 mL | Freq: Once | OROMUCOSAL | Status: AC

## 2022-12-31 SURGICAL SUPPLY — 69 items
BAG COUNTER SPONGE SURGICOUNT (BAG) ×1 IMPLANT
BAG DECANTER FOR FLEXI CONT (MISCELLANEOUS) ×1 IMPLANT
BAG SPNG CNTER NS LX DISP (BAG) ×1
BLADE SAG 18X100X1.27 (BLADE) ×1 IMPLANT
COOLER ICEMAN CLASSIC (MISCELLANEOUS) IMPLANT
COVER PERINEAL POST (MISCELLANEOUS) ×1 IMPLANT
COVER SURGICAL LIGHT HANDLE (MISCELLANEOUS) ×1 IMPLANT
CUP SECTOR GRIPTON 50MM (Cup) IMPLANT
DRAPE C-ARM 42X72 X-RAY (DRAPES) ×1 IMPLANT
DRAPE POUCH INSTRU U-SHP 10X18 (DRAPES) ×1 IMPLANT
DRAPE STERI IOBAN 125X83 (DRAPES) ×1 IMPLANT
DRAPE U-SHAPE 47X51 STRL (DRAPES) ×2 IMPLANT
DRSG AQUACEL AG ADV 3.5X10 (GAUZE/BANDAGES/DRESSINGS) ×1 IMPLANT
DRSG TEGADERM 4X10 (GAUZE/BANDAGES/DRESSINGS) IMPLANT
DRSG XEROFORM 1X8 (GAUZE/BANDAGES/DRESSINGS) IMPLANT
DURAPREP 26ML APPLICATOR (WOUND CARE) ×2 IMPLANT
ELECT BLADE 4.0 EZ CLEAN MEGAD (MISCELLANEOUS) ×1
ELECT REM PT RETURN 9FT ADLT (ELECTROSURGICAL) ×1
ELECTRODE BLDE 4.0 EZ CLN MEGD (MISCELLANEOUS) ×1 IMPLANT
ELECTRODE REM PT RTRN 9FT ADLT (ELECTROSURGICAL) ×1 IMPLANT
GAUZE SPONGE 4X4 12PLY STRL (GAUZE/BANDAGES/DRESSINGS) IMPLANT
GLOVE BIOGEL PI IND STRL 7.0 (GLOVE) ×2 IMPLANT
GLOVE BIOGEL PI IND STRL 7.5 (GLOVE) ×5 IMPLANT
GLOVE ECLIPSE 7.0 STRL STRAW (GLOVE) ×2 IMPLANT
GLOVE SKINSENSE STRL SZ7.5 (GLOVE) ×1 IMPLANT
GLOVE SURG SYN 7.5 E (GLOVE) ×2 IMPLANT
GLOVE SURG SYN 7.5 PF PI (GLOVE) ×2 IMPLANT
GLOVE SURG UNDER POLY LF SZ7 (GLOVE) ×3 IMPLANT
GLOVE SURG UNDER POLY LF SZ7.5 (GLOVE) ×2 IMPLANT
GOWN STRL REUS W/ TWL LRG LVL3 (GOWN DISPOSABLE) IMPLANT
GOWN STRL REUS W/ TWL XL LVL3 (GOWN DISPOSABLE) ×1 IMPLANT
GOWN STRL REUS W/TWL LRG LVL3 (GOWN DISPOSABLE)
GOWN STRL REUS W/TWL XL LVL3 (GOWN DISPOSABLE) ×1
GOWN STRL SURGICAL XL XLNG (GOWN DISPOSABLE) ×1 IMPLANT
GOWN TOGA ZIPPER T7+ PEEL AWAY (MISCELLANEOUS) ×2 IMPLANT
HANDPIECE INTERPULSE COAX TIP (DISPOSABLE) ×1
HEAD FEMORAL 32 CERAMIC (Hips) IMPLANT
HOOD PEEL AWAY T7 (MISCELLANEOUS) ×1 IMPLANT
IV NS IRRIG 3000ML ARTHROMATIC (IV SOLUTION) ×1 IMPLANT
KIT BASIN OR (CUSTOM PROCEDURE TRAY) ×1 IMPLANT
LINER ACETABULAR 32X50 (Liner) IMPLANT
MARKER SKIN DUAL TIP RULER LAB (MISCELLANEOUS) ×1 IMPLANT
NDL SPNL 18GX3.5 QUINCKE PK (NEEDLE) ×1 IMPLANT
NEEDLE SPNL 18GX3.5 QUINCKE PK (NEEDLE) ×1 IMPLANT
PACK TOTAL JOINT (CUSTOM PROCEDURE TRAY) ×1 IMPLANT
PACK UNIVERSAL I (CUSTOM PROCEDURE TRAY) ×1 IMPLANT
PAD ABD 8X10 STRL (GAUZE/BANDAGES/DRESSINGS) IMPLANT
PAD COLD SHLDR WRAP-ON (PAD) IMPLANT
SET HNDPC FAN SPRY TIP SCT (DISPOSABLE) ×1 IMPLANT
SOLUTION PRONTOSAN WOUND 350ML (IRRIGATION / IRRIGATOR) ×1 IMPLANT
STAPLER VISISTAT 35W (STAPLE) IMPLANT
STEM FEMORAL SZ5 HIGH ACTIS (Stem) IMPLANT
SUT ETHIBOND 2 V 37 (SUTURE) ×1 IMPLANT
SUT ETHILON 2 0 FS 18 (SUTURE) IMPLANT
SUT VIC AB 0 CT1 27 (SUTURE) ×1
SUT VIC AB 0 CT1 27XBRD ANBCTR (SUTURE) ×1 IMPLANT
SUT VIC AB 1 CTX 36 (SUTURE) ×2
SUT VIC AB 1 CTX36XBRD ANBCTR (SUTURE) ×1 IMPLANT
SUT VIC AB 2-0 CT1 27 (SUTURE) ×3
SUT VIC AB 2-0 CT1 TAPERPNT 27 (SUTURE) ×2 IMPLANT
SYR 30ML LL (SYRINGE) IMPLANT
SYR 50ML LL SCALE MARK (SYRINGE) ×1 IMPLANT
TOWEL GREEN STERILE (TOWEL DISPOSABLE) ×1 IMPLANT
TRAY CATH INTERMITTENT SS 16FR (CATHETERS) IMPLANT
TRAY FOLEY W/BAG SLVR 16FR (SET/KITS/TRAYS/PACK)
TRAY FOLEY W/BAG SLVR 16FR ST (SET/KITS/TRAYS/PACK) IMPLANT
TUBE SUCT ARGYLE STRL (TUBING) ×1 IMPLANT
WARMER LAPAROSCOPE (MISCELLANEOUS) IMPLANT
YANKAUER SUCT BULB TIP NO VENT (SUCTIONS) ×1 IMPLANT

## 2022-12-31 NOTE — H&P (Signed)
PREOPERATIVE H&P  Chief Complaint: LEFT HIP  HPI: Jamie [name] is a 67 y.o. female who presents for surgical treatment of LEFT HIP.  She denies any changes in medical history.  Past Surgical History:  Procedure Laterality Date   BONE SPUR Bilateral 2001 AND 1988   BREAST BIOPSY     BREAST LUMPECTOMY Left    07/2016   BREAST LUMPECTOMY WITH RADIOACTIVE SEED AND SENTINEL LYMPH NODE BIOPSY Left 07/26/2016   Procedure: BREAST LUMPECTOMY WITH RADIOACTIVE SEED AND SENTINEL LYMPH NODE BIOPSY;  Surgeon: Almond Lint, MD;  Location: [facility];  Service: General;  Laterality: Left;   BUNIONECTOMY Bilateral 02/2012   COLONOSCOPY W/ POLYPECTOMY  08/2007   DILATION AND CURETTAGE OF UTERUS  2002   MOHS SURGERY  2010   PORT-A-CATH REMOVAL N/A 07/18/2017   Procedure: REMOVAL PORT-A-CATH;  Surgeon: Almond Lint, MD;  Location: [facility];  Service: General;  Laterality: N/A;   PORTACATH PLACEMENT Right 07/26/2016   Procedure: INSERTION PORT-A-CATH;  Surgeon: Almond Lint, MD;  Location: [facility];  Service: General;  Laterality: Right;   Social History   Socioeconomic History   Marital status: Married    Spouse name: Not on file   Number of children: 2   Years of education: Not on file   Highest education level: Associate degree: academic program  Occupational History   Occupation: retired  Tobacco Use   Smoking status: Never    Passive exposure: Never   Smokeless tobacco: Never  Vaping Use   Vaping status: Never Used  Substance and Sexual Activity   Alcohol use: Not Currently    Comment: occasionally   Drug use: No   Sexual activity: Yes    Birth control/protection: Post-menopausal  Other Topics Concern   Not on file  Social History Narrative   2 adopted children.  4 grands   Retired Arts administrator   Social Determinants of Health   Financial Resource Strain: Low Risk  (11/15/2022)   Overall Financial Resource Strain (CARDIA)     Difficulty of Paying Living Expenses: Not hard at all  Food Insecurity: No Food Insecurity (11/15/2022)   Hunger Vital Sign    Worried About Running Out of Food in the Last Year: Never true    Ran Out of Food in the Last Year: Never true  Transportation Needs: No Transportation Needs (11/15/2022)   PRAPARE - Administrator, Civil Service (Medical): No    Lack of Transportation (Non-Medical): No  Physical Activity: Sufficiently Active (11/15/2022)   Exercise Vital Sign    Days of Exercise per Week: 4 days    Minutes of Exercise per Session: 50 min  Stress: No Stress Concern Present (11/15/2022)   [employer] of Occupational Health - Occupational Stress Questionnaire    Feeling of Stress : Only a little  Social Connections: Socially Integrated (11/15/2022)   Social Connection and Isolation Panel [NHANES]    Frequency of Communication with Friends and Family: More than three times a week    Frequency of Social Gatherings with Friends and Family: Three times a week    Attends Religious Services: More than 4 times per year    Active Member of Clubs or Organizations: Yes    Attends Banker Meetings: More than 4 times per year    Marital Status: Married   Family History  Problem Relation Age of Onset   Hyperlipidemia Mother    Ovarian cancer Mother 44  Asthma Mother    Cancer Mother    Hyperlipidemia Father    Cancer Father    Hypertension Father    Stroke Father    Heart disease Father    Depression Father    Heart attack Father    Hyperlipidemia Sister    Arthritis Sister    Osteoporosis Sister    Breast cancer Maternal Aunt 101       recurred at 72   Liver cancer Maternal Uncle 77   Heart attack Maternal Grandmother    Heart attack Paternal Grandmother    Heart disease Paternal Grandmother    Colon cancer Neg Hx    Colon polyps Neg Hx    Esophageal cancer Neg Hx    Rectal cancer Neg Hx    Stomach cancer Neg Hx    Allergies  Allergen Reactions    Adhesive [Tape] Rash    Paper Tape is ok    Codeine Nausea Only and Other (See Comments)    Dizzy   Sulfamethoxazole Nausea And Vomiting   Wound Dressing Adhesive Rash   Prior to Admission medications   Medication Sig Start Date End Date Taking? Authorizing Provider  BIOTIN PO Take 1 tablet by mouth daily.   Yes [provider]  calcium-vitamin D (OSCAL WITH D) 500-200 MG-UNIT tablet Take 1 tablet by mouth 2 (two) times daily.   Yes [provider]  cetirizine (ZYRTEC) 10 MG tablet Take 10 mg by mouth daily as needed (for allergies--Spring/Summer).    Yes [provider]  Cholecalciferol (VITAMIN D3) 1000 units CAPS Take 1,000 Units by mouth daily.    Yes [provider]  docusate sodium (COLACE) 100 MG capsule Take 1 capsule (100 mg total) by mouth daily as needed. 12/24/22 12/24/23  Cristie Hem, PA-C  enoxaparin (LOVENOX) 40 MG/0.4ML injection Inject 0.4 mLs (40 mg total) into the skin daily for 28 days. To be taken after surgery to prevent blood clots 12/24/22 01/21/23  Cristie Hem, PA-C  Ginkgo Biloba 40 MG TABS Take 40 mg by mouth daily.   Yes [provider]  methocarbamol (ROBAXIN-750) 750 MG tablet Take 1 tablet (750 mg total) by mouth 2 (two) times daily as needed for muscle spasms. 12/24/22   Cristie Hem, PA-C  Multiple Vitamin (MULTIVITAMIN) tablet Take 1 tablet by mouth daily.   Yes [provider]  Omega-3 Fatty Acids (OMEGA-3 FISH OIL PO) Take 1,000 mg by mouth in the morning and at bedtime.   Yes [provider]  ondansetron (ZOFRAN) 4 MG tablet Take 1 tablet (4 mg total) by mouth every 8 (eight) hours as needed for nausea or vomiting. 12/24/22   Cristie Hem, PA-C  oxyCODONE-acetaminophen (PERCOCET) 5-325 MG tablet Take 1-2 tablets by mouth every 6 (six) hours as needed. To be taken after surgery 12/24/22   Cristie Hem, PA-C  Psyllium (METAMUCIL) 28.3 % POWD Take 1 Scoop by mouth 2 (two) times daily as  needed (constipation). 05/21/21  Yes [provider]  Red Yeast Rice Extract (RED YEAST RICE PO) Take 1 capsule by mouth in the morning and at bedtime.   Yes [provider]  rOPINIRole (REQUIP) 0.25 MG tablet TAKE 1 TABLET BY MOUTH AT BEDTIME. 12/17/22  Yes Worley, [place], PA  urea (GORDONS UREA) 40 % ointment Apply topically as needed. 04/07/20  Yes Vivi Barrack, DPM     Positive ROS: All other systems have been reviewed and were otherwise negative with the exception  of those mentioned in the HPI and as above.  Physical Exam: General: Alert, no acute distress Cardiovascular: No pedal edema Respiratory: No cyanosis, no use of accessory musculature GI: abdomen soft Skin: No lesions in the area of chief complaint Neurologic: Sensation intact distally Psychiatric: Patient is competent for consent with normal mood and affect Lymphatic: no lymphedema  MUSCULOSKELETAL: exam stable  Assessment: LEFT HIP  Plan: Plan for Procedure(s): TOTAL HIP ARTHROPLASTY ANTERIOR APPROACH  The risks benefits and alternatives were discussed with the patient including but not limited to the risks of nonoperative treatment, versus surgical intervention including infection, bleeding, nerve injury,  blood clots, cardiopulmonary complications, morbidity, mortality, among others, and they were willing to proceed.   Glee Arvin, MD 12/31/2022 6:14 AM

## 2022-12-31 NOTE — Transfer of Care (Signed)
Immediate Anesthesia Transfer of Care Note  Patient: Jamie [name]  Procedure(s) Performed: LEFT TOTAL HIP REPLACEMENT (Left: Hip)  Patient Location: PACU  Anesthesia Type:MAC and Spinal  Level of Consciousness: awake  Airway & Oxygen Therapy: Patient Spontanous Breathing  Post-op Assessment: Report given to RN and Post -op Vital signs reviewed and stable  Post vital signs: Reviewed and stable  Last Vitals:  Vitals Value Taken Time  BP 100/57 12/31/22 0905  Temp    Pulse 70 12/31/22 0907  Resp 8 12/31/22 0907  SpO2 96 % 12/31/22 0907  Vitals shown include unfiled device data.  Last Pain:  Vitals:   12/31/22 0626  TempSrc:   PainSc: 2          Complications: No notable events documented.

## 2022-12-31 NOTE — Anesthesia Preprocedure Evaluation (Signed)
Anesthesia Evaluation  Patient identified by MRN, date of birth, ID band Patient awake    Reviewed: Allergy & Precautions, H&P , NPO status , Patient's Chart, lab work & pertinent test results  History of Anesthesia Complications (+) PONV and history of anesthetic complications  Airway Mallampati: II   Neck ROM: full    Dental   Pulmonary neg pulmonary ROS   breath sounds clear to auscultation       Cardiovascular negative cardio ROS  Rhythm:regular Rate:Normal     Neuro/Psych  PSYCHIATRIC DISORDERS Anxiety Depression       GI/Hepatic   Endo/Other  Hypothyroidism    Renal/GU      Musculoskeletal  (+) Arthritis ,    Abdominal   Peds  Hematology   Anesthesia Other Findings   Reproductive/Obstetrics                             Anesthesia Physical Anesthesia Plan  ASA: 2  Anesthesia Plan: MAC and Spinal   Post-op Pain Management:    Induction: Intravenous  PONV Risk Score and Plan: 3 and Propofol infusion, Ondansetron, Midazolam and Treatment may vary due to age or medical condition  Airway Management Planned: Simple Face Mask  Additional Equipment:   Intra-op Plan:   Post-operative Plan:   Informed Consent: I have reviewed the patients History and Physical, chart, labs and discussed the procedure including the risks, benefits and alternatives for the proposed anesthesia with the patient or authorized representative who has indicated his/her understanding and acceptance.     Dental advisory given  Plan Discussed with: CRNA, Anesthesiologist and Surgeon  Anesthesia Plan Comments:        Anesthesia Quick Evaluation

## 2022-12-31 NOTE — Discharge Instructions (Signed)

## 2022-12-31 NOTE — Evaluation (Signed)
Physical Therapy Evaluation Patient Details Name: Jamie [name] MRN: [id number] DOB: [birth year] Today's Date: 12/31/2022  History of Present Illness  Pt is 67 year old presented to [facility] on  12/31/22 for lt THR. PMH - breast CA  Clinical Impression  Pt admitted with above diagnosis and presents to PT with functional limitations due to deficits listed below (See PT problem list). Pt needs skilled PT to maximize independence and safety.  Today pt limited by nausea and vomiting but expect will move very well as that resolves. Plans to return home with husband.          If plan is discharge home, recommend the following: A little help with walking and/or transfers;A little help with bathing/dressing/bathroom;Help with stairs or ramp for entrance;Assist for transportation;Assistance with cooking/housework   Can travel by private Tax inspector (2 wheels)  Recommendations for Other Services       Functional Status Assessment Patient has had a recent decline in their functional status and demonstrates the ability to make significant improvements in function in a reasonable and predictable amount of time.     Precautions / Restrictions        Mobility  Bed Mobility Overal bed mobility: Needs Assistance Bed Mobility: Supine to Sit, Sit to Sidelying     Supine to sit: Min assist   Sit to sidelying: Min assist General bed mobility comments: Assist to bring LLE off and on the bed    Transfers Overall transfer level: Needs assistance Equipment used: Rolling walker (2 wheels) Transfers: Sit to/from Stand Sit to Stand: Contact guard assist           General transfer comment: Assist for safety. Verbal cues for hand placement    Ambulation/Gait             Pre-gait activities: Side stepped up side of bed with min guard General Gait Details: Unable due to nausea and vomiting  Stairs            Wheelchair Mobility      Tilt Bed    Modified Rankin (Stroke Patients Only)       Balance Overall balance assessment: No apparent balance deficits (not formally assessed)                                           Pertinent Vitals/Pain Pain Assessment Pain Assessment: Faces Faces Pain Scale: Hurts even more Pain Location: lt hip Pain Descriptors / Indicators: Sore, Guarding, Grimacing Pain Intervention(s): Limited activity within patient's tolerance, Monitored during session, Repositioned    Home Living Family/patient expects to be discharged to:: Private residence Living Arrangements: Spouse/significant other Available Help at Discharge: Family;Available 24 hours/day Type of Home: House Home Access: Stairs to enter Entrance Stairs-Rails: None Entrance Stairs-Number of Steps: 2 Alternate Level Stairs-Number of Steps: flight Home Layout: Two level;1/2 bath on main level;Bed/bath upstairs Home Equipment: Toilet riser;Shower seat - built in      Prior Function Prior Level of Function : Independent/Modified Independent;Driving             Mobility Comments: No assistive device       Extremity/Trunk Assessment   Upper Extremity Assessment Upper Extremity Assessment: Defer to OT evaluation    Lower Extremity Assessment Lower Extremity Assessment: LLE deficits/detail LLE Deficits / Details: Limited by post op pain  Communication   Communication Communication: No apparent difficulties  Cognition Arousal: Alert Behavior During Therapy: WFL for tasks assessed/performed Overall Cognitive Status: Within Functional Limits for tasks assessed                                          General Comments General comments (skin integrity, edema, etc.): HR, SpO2, and BP (110's/60's) stable throughout    Exercises Total Joint Exercises Ankle Circles/Pumps: AROM, Left, 5 reps, Supine Quad Sets: AROM, Left, 5 reps, Supine Heel Slides: AAROM, Left, 5  reps, Supine Hip ABduction/ADduction: AAROM, Left, 5 reps, Supine   Assessment/Plan    PT Assessment Patient needs continued PT services  PT Problem List Decreased strength;Decreased mobility;Pain;Decreased knowledge of use of DME       PT Treatment Interventions DME instruction;Gait training;Stair training;Functional mobility training;Therapeutic activities;Therapeutic exercise;Patient/family education    PT Goals (Current goals can be found in the Care Plan section)  Acute Rehab PT Goals Patient Stated Goal: return home PT Goal Formulation: With patient Time For Goal Achievement: 01/07/23 Potential to Achieve Goals: Good    Frequency 7X/week     Co-evaluation               AM-PAC PT "6 Clicks" Mobility  Outcome Measure Help needed turning from your back to your side while in a flat bed without using bedrails?: A Little Help needed moving from lying on your back to sitting on the side of a flat bed without using bedrails?: A Little Help needed moving to and from a bed to a chair (including a wheelchair)?: A Little Help needed standing up from a chair using your arms (e.g., wheelchair or bedside chair)?: A Little Help needed to walk in hospital room?: Total Help needed climbing 3-5 steps with a railing? : Total 6 Click Score: 14    End of Session   Activity Tolerance: Other (comment) (nausea and vomiting) Patient left: in bed;with call bell/phone within reach;with nursing/sitter in room;with family/visitor present Nurse Communication: Mobility status PT Visit Diagnosis: Other abnormalities of gait and mobility (R26.89);Pain Pain - Right/Left: Left Pain - part of body: Hip    Time: 2536-6440 PT Time Calculation (min) (ACUTE ONLY): 30 min   Charges:   PT Evaluation $PT Eval Low Complexity: 1 Low PT Treatments $Gait Training: 8-22 mins PT General Charges $$ ACUTE PT VISIT: 1 Visit         [facility] PT Acute Rehabilitation Services Office  [phone]   Angelina Ok [facility] 12/31/2022, 2:40 PM

## 2022-12-31 NOTE — Op Note (Signed)
LEFT TOTAL HIP REPLACEMENT  Procedure Note Jamie [name]   [id number]  Pre-op Diagnosis: LEFT HIP OSTEOARTHRITIS     Post-op Diagnosis: same  Operative Findings Complete loss of joint space   Operative Procedures  1. Total hip replacement; Left hip; uncemented [id number]   Surgeon: Gershon Mussel, M.D.  Assist: Oneal Grout, PA-C   Anesthesia: Spinal  Prosthesis: Depuy Acetabulum: Pinnacle 50 mm Femur: Actis 5 HO Head: 32 mm size: +1 Liner: +0 neutral Bearing Type: ceramic/poly  Total Hip Arthroplasty (Anterior Approach) Op Note:  After informed consent was obtained and the operative extremity marked in the holding area, the patient was brought back to the operating room and placed supine on the HANA table. Next, the operative extremity was prepped and draped in normal sterile fashion. Surgical timeout occurred verifying patient identification, surgical site, surgical procedure and administration of antibiotics.  A 10 cm longitudinal incision was made starting from 2 fingerbreadths lateral and inferior to the ASIS towards the lateral aspect of the patella.  A Hueter approach to the hip was performed, using the interval between tensor fascia lata and sartorius.  Dissection was carried bluntly down onto the anterior hip capsule. The lateral femoral circumflex vessels were identified and coagulated. A capsulotomy was performed and the capsular flaps tagged for later repair.  The neck osteotomy was performed. The femoral head was removed which showed complete loss of articular cartilage, the acetabular rim was cleared of soft tissue and osteophytes and attention was turned to reaming the acetabulum.  Sequential reaming was performed under fluoroscopic guidance down to the floor of the cotyloid fossa. We reamed to a size 49 mm, and then impacted the acetabular shell.   The liner was then placed after irrigation and attention turned to the femur.  After placing the femoral hook, the  leg was taken to externally rotated, extended and adducted position taking care to perform soft tissue releases to allow for adequate mobilization of the femur. Soft tissue was cleared from the shoulder of the greater trochanter and the hook elevator used to improve exposure of the proximal femur. Sequential broaching performed up to a size 5 high offset. Trial neck and head were placed. The leg was brought back up to neutral and the construct reduced.  Antibiotic irrigation was placed in the surgical wound.  The position and sizing of components, offset and leg lengths were checked using fluoroscopy. Stability of the construct was checked in extension and external rotation without any subluxation, shuck or impingement of prosthesis. We dislocated the prosthesis, dropped the leg back into position, removed trial components, and irrigated copiously. The final stem and head was then placed, the leg brought back up, the system reduced and fluoroscopy used to verify positioning.  We irrigated, obtained hemostasis and closed the capsule using #2 ethibond suture.  One gram of vancomycin powder was placed in the surgical bed.   One gram of topical tranexamic acid was injected into the joint.  The fascia was closed with #1 vicryl plus, the deep fat layer was closed with 0 vicryl, the subcutaneous layers closed with 2.0 Vicryl Plus and the skin closed with 2.0 nylon and dermabond. A sterile dressing was applied. The patient was awakened in the operating room and taken to recovery in stable condition.  All sponge, needle, and instrument counts were correct at the end of the case.   Tessa Lerner, my PA, was a medical necessity for opening, closing, limb positioning, retracting, exposing, and overall facilitation and  timely completion of the surgery.  Position: supine  Complications: see description of procedure.  Time Out: performed   Drains/Packing: none  Estimated blood loss: see anesthesia record  Returned  to Recovery Room: in good condition.   Antibiotics: yes   Mechanical VTE (DVT) Prophylaxis: sequential compression devices, TED thigh-high  Chemical VTE (DVT) Prophylaxis: lovenox POD 1   Fluid Replacement: see anesthesia record  Specimens Removed: 1 to pathology   Sponge and Instrument Count Correct? yes   PACU: portable radiograph - low AP   Plan/RTC: Return in 2 weeks for staple removal. Weight Bearing/Load Lower Extremity: full  Hip precautions: none Suture Removal: 2 weeks   N. Glee Arvin, MD [facility] 8:29 AM   Implant Name Type Inv. Item Serial No. Manufacturer Lot No. LRB No. Used Action  CUP SECTOR GRIPTON - [id number] Cup CUP SECTOR GRIPTON  [employer] [id number] Left 1 Implanted  LINER ACETABULAR 32X50 - [id number] Liner LINER ACETABULAR 32X50  DEPUY ORTH[employer] [id number] Left 1 Implanted  STEM FEMORAL SZ5 HIGH ACTIS - [id number] Stem STEM FEMORAL SZ5 HIGH ACTIS  DEPUY ORTH[employer]AED[employer] [id number] Left 1 Implanted  HEAD FEMORAL 32 CERAMIC - [id number] Hips HEAD FEMORAL 32 CERAMIC  [employer] [id number] Left 1 Implanted

## 2022-12-31 NOTE — Evaluation (Signed)
Occupational Therapy Evaluation Patient Details Name: Jamie [name] MRN: [id number] DOB: 1956-04-20 Today's Date: 12/31/2022   History of Present Illness Pt is 67 year old presented to [facility] on  12/31/22 for lt THR. PMH - breast CA   Clinical Impression   Patient admitted for the procedure above.  PTA she lives at home with her spouse, who did assist on occasion with lower body ADL.  Patient limited this session due to nausea despite medication.  OT will continue efforts in the acute setting to ensure a safe discharge home.  No post acute OT is anticipated.         If plan is discharge home, recommend the following: Assist for transportation;A little help with bathing/dressing/bathroom;Assistance with cooking/housework    Functional Status Assessment  Patient has had a recent decline in their functional status and demonstrates the ability to make significant improvements in function in a reasonable and predictable amount of time.  Equipment Recommendations  None recommended by OT    Recommendations for Other Services       Precautions / Restrictions Precautions Precautions: Anterior Hip Precaution Booklet Issued: No Restrictions Weight Bearing Restrictions: Yes LLE Weight Bearing: Weight bearing as tolerated      Mobility Bed Mobility Overal bed mobility: Needs Assistance Bed Mobility: Supine to Sit, Sit to Supine     Supine to sit: Supervision Sit to supine: Min assist        Transfers Overall transfer level: Needs assistance Equipment used: Rolling walker (2 wheels) Transfers: Sit to/from Stand Sit to Stand: Contact guard assist                  Balance Overall balance assessment: Needs assistance Sitting-balance support: Feet supported Sitting balance-Leahy Scale: Good     Standing balance support: Reliant on assistive device for balance Standing balance-Leahy Scale: Fair                             ADL either performed or assessed  with clinical judgement   ADL       Grooming: Wash/dry hands;Wash/dry face;Set up;Sitting               Lower Body Dressing: Minimal assistance;Sitting/lateral leans     Toilet Transfer Details (indicate cue type and reason): not able die to nausea                 Vision Patient Visual Report: No change from baseline       Perception Perception: Within Functional Limits       Praxis Praxis: WFL       Pertinent Vitals/Pain Pain Assessment Pain Assessment: Faces Faces Pain Scale: Hurts little more Pain Location: lt hip Pain Descriptors / Indicators: Tender, Sore Pain Intervention(s): Monitored during session     Extremity/Trunk Assessment Upper Extremity Assessment Upper Extremity Assessment: Overall WFL for tasks assessed   Lower Extremity Assessment Lower Extremity Assessment: Defer to PT evaluation   Cervical / Trunk Assessment Cervical / Trunk Assessment: Normal   Communication Communication Communication: No apparent difficulties   Cognition Arousal: Alert Behavior During Therapy: WFL for tasks assessed/performed Overall Cognitive Status: Within Functional Limits for tasks assessed                                       General Comments   VSS    Exercises  Shoulder Instructions      Home Living Family/patient expects to be discharged to:: Private residence Living Arrangements: Spouse/significant other Available Help at Discharge: Family;Available 24 hours/day Type of Home: House Home Access: Stairs to enter [employer] of Steps: 2 Entrance Stairs-Rails: None Home Layout: Two level;1/2 bath on main level;Bed/bath upstairs Alternate Level Stairs-Number of Steps: flight   Bathroom Shower/Tub: Walk-in shower;Tub/shower unit   Bathroom Toilet: Standard Bathroom Accessibility: Yes How Accessible: Accessible via walker Home Equipment: Toilet riser;Shower seat - built in;Adaptive  equipment;BSC/3in1 [employer] Equipment: Reacher;Long-handled sponge        Prior Functioning/Environment Prior Level of Function : Independent/Modified Independent;Driving             Mobility Comments: No assistive device ADLs Comments: Ind        OT Problem List: Decreased range of motion;Impaired balance (sitting and/or standing);Pain      OT Treatment/Interventions: Self-care/ADL training;Therapeutic activities;DME and/or AE instruction;Patient/family education    OT Goals(Current goals can be found in the care plan section) Acute Rehab OT Goals Patient Stated Goal: Return home OT Goal Formulation: With patient Time For Goal Achievement: 01/14/23 Potential to Achieve Goals: Good ADL Goals Pt Will Perform Grooming: with modified independence;standing Pt Will Perform Lower Body Dressing: with modified independence;sit to/from stand Pt Will Transfer to Toilet: with modified independence;ambulating;regular height toilet  OT Frequency: Min 2X/week    Co-evaluation              AM-PAC OT "6 Clicks" Daily Activity     Outcome Measure Help from another person eating meals?: None Help from another person taking care of personal grooming?: None Help from another person toileting, which includes using toliet, bedpan, or urinal?: A Little Help from another person bathing (including washing, rinsing, drying)?: A Little Help from another person to put on and taking off regular upper body clothing?: None Help from another person to put on and taking off regular lower body clothing?: A Little 6 Click Score: 21   End of Session Equipment Utilized During Treatment: Rolling walker (2 wheels) Nurse Communication: Other (comment) (nausea)  Activity Tolerance: Other (comment) (Limited by nausea) Patient left: in bed;with call bell/phone within reach  OT Visit Diagnosis: Unsteadiness on feet (R26.81);Pain Pain - Right/Left: Left Pain - part of body: Hip                Time:  1610-9604 OT Time Calculation (min): 20 min Charges:  OT General Charges $OT Visit: 1 Visit OT Evaluation $OT Eval Moderate Complexity: 1 Mod  12/31/2022  RP, OTR/L  Acute Rehabilitation Services  Office:  [phone]   Suzanna Obey 12/31/2022, 5:23 PM

## 2022-12-31 NOTE — Anesthesia Procedure Notes (Signed)
Spinal  Patient location during procedure: OR Start time: 12/31/2022 7:15 AM End time: 12/31/2022 7:18 AM Reason for block: surgical anesthesia Staffing Performed: anesthesiologist  Anesthesiologist: Achille Rich, MD Performed by: Achille Rich, MD Authorized by: Achille Rich, MD   Preanesthetic Checklist Completed: patient identified, IV checked, risks and benefits discussed, surgical consent, monitors and equipment checked, pre-op evaluation and timeout performed Spinal Block Patient position: sitting Prep: DuraPrep Patient monitoring: cardiac monitor, continuous pulse ox and blood pressure Approach: midline Location: L3-4 Injection technique: single-shot Needle Needle type: Pencan  Needle gauge: 24 G Needle length: 9 cm Assessment Sensory level: T10 Events: CSF return Additional Notes Functioning IV was confirmed and monitors were applied. Sterile prep and drape, including hand hygiene and sterile gloves were used. The patient was positioned and the spine was prepped. The skin was anesthetized with lidocaine.  Free flow of clear CSF was obtained prior to injecting local anesthetic into the CSF.  The spinal needle aspirated freely following injection.  The needle was carefully withdrawn.  The patient tolerated the procedure well.

## 2023-01-01 ENCOUNTER — Encounter (HOSPITAL_COMMUNITY): Payer: Self-pay | Admitting: Orthopaedic Surgery

## 2023-01-01 DIAGNOSIS — M1612 Unilateral primary osteoarthritis, left hip: Secondary | ICD-10-CM | POA: Diagnosis not present

## 2023-01-01 NOTE — Discharge Summary (Signed)
Patient ID: Jamie [name] MRN: [id number] DOB/AGE: Oct 31, 1955 67 y.o.  Admit date: 12/31/2022 Discharge date: 01/01/2023  Admission Diagnoses:  Principal Problem:   Primary osteoarthritis of left hip Active Problems:   Status post total replacement of left hip   Discharge Diagnoses:  Same  Past Medical History:  Diagnosis Date   Allergy    Anemia    Hx   Anxiety    Occasionally   Arthritis    feet   Basal cell carcinoma    Breast cancer (HCC) 06/2016   left breast   Colon polyps    Depression    Eczema    Genetic testing 08/15/2016   Ms. Coryell underwent genetic testing for hereditary cancer syndrome through [facility]'s 43-gene Common Hereditary Cancers Panel. Ms. 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 Result report is dated 08/15/2016. Please see genetic counseling documentation from 08/17/2016 for further discussion.   History of radiation therapy 01/02/17-01/31/17   left breast was treated to 42.72 Gy in 16 fractions, left breast boost 10 Gy in 5 fractions   Hyperlipidemia    Personal history of chemotherapy    Personal history of radiation therapy    Pneumonia    2000s   PONV (postoperative nausea and vomiting)     Surgeries: Procedure(s): LEFT TOTAL HIP REPLACEMENT on 12/31/2022   Consultants:   Discharged Condition: Improved  Hospital Course: Jamie [name] is an 67 y.o. female who was admitted 12/31/2022 for operative treatment ofPrimary osteoarthritis of left hip. Patient has severe unremitting pain that affects sleep, daily activities, and work/hobbies. After pre-op clearance the patient was taken to the operating room on 12/31/2022 and underwent  Procedure(s): LEFT TOTAL HIP REPLACEMENT.    Patient was given perioperative antibiotics:  Anti-infectives (From admission, onward)    Start     Dose/Rate Route Frequency Ordered Stop   12/31/22 1300  ceFAZolin (ANCEF) IVPB 2g/100 mL  premix        2 g 200 mL/hr over 30 Minutes Intravenous Every 6 hours 12/31/22 1032 12/31/22 1930   12/31/22 0759  vancomycin (VANCOCIN) powder  Status:  Discontinued          As needed 12/31/22 0759 12/31/22 0902   12/31/22 0600  ceFAZolin (ANCEF) IVPB 2g/100 mL premix        2 g 200 mL/hr over 30 Minutes Intravenous On call to O.R. 12/31/22 4098 12/31/22 0748   12/31/22 0558  ceFAZolin (ANCEF) 2-4 GM/100ML-% IVPB       Note to Pharmacy: Camillo Flaming: cabinet override      12/31/22 0558 12/31/22 0729        Patient was given sequential compression devices, early ambulation, and chemoprophylaxis to prevent DVT.  Patient benefited maximally from hospital stay and there were no complications.    Recent vital signs: Patient Vitals for the past 24 hrs:  BP Temp Temp src Pulse Resp SpO2  01/01/23 0743 (!) 112/59 99.2 F (37.3 C) Oral 84 16 96 %  01/01/23 0412 112/61 -- -- 85 -- --  01/01/23 0408 (!) 92/58 98.9 F (37.2 C) Oral 68 18 96 %  12/31/22 2244 (!) 98/59 98.4 F (36.9 C) Oral 91 18 100 %  12/31/22 1528 (!) 116/59 98.1 F (36.7 C) Oral 66 20 98 %  12/31/22 1038 126/72 97.6 F (36.4 C) Oral 68 20 99 %  12/31/22 1015 129/74 -- -- (!) 55 (!) 9 98 %  12/31/22  1000 121/66 -- -- (!) 56 14 97 %  12/31/22 0935 115/62 (!) 97.5 F (36.4 C) -- 67 10 97 %  12/31/22 0920 104/61 -- -- 69 12 96 %  12/31/22 0905 (!) 100/57 (!) 97.1 F (36.2 C) -- 69 11 94 %     Recent laboratory studies:  Recent Labs    01/01/23 0022  WBC 16.5*  HGB 10.4*  HCT 30.6*  PLT 244     Discharge Medications:   Allergies as of 01/01/2023       Reactions   Adhesive [tape] Rash   Paper Tape is ok    Codeine Nausea Only, Other (See Comments)   Dizzy   Sulfamethoxazole Nausea And Vomiting   Wound Dressing Adhesive Rash        Medication List     STOP taking these medications    OMEGA-3 FISH OIL PO       TAKE these medications    BIOTIN PO Take 1 tablet by mouth daily.    calcium-vitamin D 500-200 MG-UNIT tablet Commonly known as: OSCAL WITH D Take 1 tablet by mouth 2 (two) times daily.   cetirizine 10 MG tablet Commonly known as: ZYRTEC Take 10 mg by mouth daily as needed (for allergies--Spring/Summer).   docusate sodium 100 MG capsule Commonly known as: Colace Take 1 capsule (100 mg total) by mouth daily as needed.   enoxaparin 40 MG/0.4ML injection Commonly known as: LOVENOX Inject 0.4 mLs (40 mg total) into the skin daily for 28 days. To be taken after surgery to prevent blood clots   Ginkgo Biloba 40 MG Tabs Take 40 mg by mouth daily.   Gordons Urea 40 % ointment Generic drug: urea Apply topically as needed.   Metamucil 28.3 % Powd Generic drug: Psyllium Take 1 Scoop by mouth 2 (two) times daily as needed (constipation).   methocarbamol 750 MG tablet Commonly known as: Robaxin-750 Take 1 tablet (750 mg total) by mouth 2 (two) times daily as needed for muscle spasms.   multivitamin tablet Take 1 tablet by mouth daily.   ondansetron 4 MG tablet Commonly known as: Zofran Take 1 tablet (4 mg total) by mouth every 8 (eight) hours as needed for nausea or vomiting.   oxyCODONE-acetaminophen 5-325 MG tablet Commonly known as: Percocet Take 1-2 tablets by mouth every 6 (six) hours as needed. To be taken after surgery   RED YEAST RICE PO Take 1 capsule by mouth in the morning and at bedtime.   rOPINIRole 0.25 MG tablet Commonly known as: REQUIP TAKE 1 TABLET BY MOUTH AT BEDTIME.   Vitamin D3 25 MCG (1000 UT) Caps Take 1,000 Units by mouth daily.               Durable Medical Equipment  (From admission, onward)           Start     Ordered   12/31/22 1033  DME Walker rolling  Once       Question:  Patient needs a walker to treat with the following condition  Answer:  History of hip replacement   12/31/22 1032   12/31/22 1033  DME 3 n 1  Once        12/31/22 1032   12/31/22 1033  DME Bedside commode  Once        Question:  Patient needs a bedside commode to treat with the following condition  Answer:  History of hip replacement   12/31/22 1032  Diagnostic Studies: DG HIP UNILAT WITH PELVIS 1V LEFT  Result Date: 12/31/2022 CLINICAL DATA:  Left total hip arthroplasty. EXAM: DG HIP (WITH OR WITHOUT PELVIS) 1V*L*; PORTABLE PELVIS 1-2 VIEWS COMPARISON:  Pelvic and left hip radiographs 10/23/2022 FINDINGS: C-arm fluoroscopy was provided in the operating room without the presence of a radiologist.32 seconds fluoroscopy time. 2.8 mGy air kerma. Four C-arm fluoroscopic images were obtained intraoperatively and are submitted for post operative interpretation. Images demonstrate left total hip arthroplasty without apparent complication. Please see intraoperative findings for further detail. One-view pelvis: AP view of the pelvis obtained postoperatively at 0916 hours demonstrates no evidence of acute fracture or dislocation following left total hip arthroplasty. There is a small amount of gas within the soft tissue surrounding the left hip. IMPRESSION: Intraoperative fluoroscopic guidance for left total hip arthroplasty. Postoperative imaging demonstrates no complication. Electronically Signed   By: Carey Bullocks M.D.   On: 12/31/2022 12:21   DG Pelvis Portable  Result Date: 12/31/2022 CLINICAL DATA:  Left total hip arthroplasty. EXAM: DG HIP (WITH OR WITHOUT PELVIS) 1V*L*; PORTABLE PELVIS 1-2 VIEWS COMPARISON:  Pelvic and left hip radiographs 10/23/2022 FINDINGS: C-arm fluoroscopy was provided in the operating room without the presence of a radiologist.32 seconds fluoroscopy time. 2.8 mGy air kerma. Four C-arm fluoroscopic images were obtained intraoperatively and are submitted for post operative interpretation. Images demonstrate left total hip arthroplasty without apparent complication. Please see intraoperative findings for further detail. One-view pelvis: AP view of the pelvis obtained postoperatively  at 0916 hours demonstrates no evidence of acute fracture or dislocation following left total hip arthroplasty. There is a small amount of gas within the soft tissue surrounding the left hip. IMPRESSION: Intraoperative fluoroscopic guidance for left total hip arthroplasty. Postoperative imaging demonstrates no complication. Electronically Signed   By: Carey Bullocks M.D.   On: 12/31/2022 12:21   DG C-Arm 1-60 Min-No Report  Result Date: 12/31/2022 Fluoroscopy was utilized by the requesting physician.  No radiographic interpretation.   DG C-Arm 1-60 Min-No Report  Result Date: 12/31/2022 Fluoroscopy was utilized by the requesting physician.  No radiographic interpretation.    Disposition: Discharge disposition: 01-Home or Self Care          Follow-up Information     Cristie Hem, PA-C. Schedule an appointment as soon as possible for a visit in 2 week(s).   Specialty: Orthopedic Surgery Contact information: [address] [place] [address] [phone]         [facility] Follow up.   Why: [facility]. the home health agency will contact you for the first home visit. Contact information: [address] [place] [address] [phone]                  Signed: Cristie Hem 01/01/2023, 8:03 AM

## 2023-01-01 NOTE — Plan of Care (Signed)
  Problem: Education: Goal: Knowledge of the prescribed therapeutic regimen will improve Outcome: Completed/Met Goal: Understanding of discharge needs will improve Outcome: Completed/Met Goal: Individualized Educational Video(s) Outcome: Completed/Met   Problem: Activity: Goal: Ability to avoid complications of mobility impairment will improve Outcome: Completed/Met Goal: Ability to tolerate increased activity will improve Outcome: Completed/Met   Problem: Clinical Measurements: Goal: Postoperative complications will be avoided or minimized Outcome: Completed/Met   Problem: Pain Management: Goal: Pain level will decrease with appropriate interventions Outcome: Completed/Met   Problem: Skin Integrity: Goal: Will show signs of wound healing Outcome: Completed/Met   Problem: Education: Goal: Knowledge of General Education information will improve Description: Including pain rating scale, medication(s)/side effects and non-pharmacologic comfort measures Outcome: Completed/Met

## 2023-01-01 NOTE — Anesthesia Postprocedure Evaluation (Signed)
Anesthesia Post Note  Patient: Jamie [name]  Procedure(s) Performed: LEFT TOTAL HIP REPLACEMENT (Left: Hip)     Patient location during evaluation: PACU Anesthesia Type: Spinal and MAC Level of consciousness: oriented and awake and alert Pain management: pain level controlled Vital Signs Assessment: post-procedure vital signs reviewed and stable Respiratory status: spontaneous breathing, respiratory function stable and patient connected to nasal cannula oxygen Cardiovascular status: blood pressure returned to baseline and stable Postop Assessment: no headache, no backache and no apparent nausea or vomiting Anesthetic complications: no   No notable events documented.  Last Vitals:  Vitals:   01/01/23 0412 01/01/23 0743  BP: 112/61 (!) 112/59  Pulse: 85 84  Resp:  16  Temp:  37.3 C  SpO2:  96%    Last Pain:  Vitals:   01/01/23 0743  TempSrc: Oral  PainSc:                  , S

## 2023-01-01 NOTE — Progress Notes (Signed)
Physical Therapy Treatment  Patient Details Name: Jamie [name] MRN: [id number] DOB: November 03, 1955 Today's Date: 01/01/2023   History of Present Illness Pt is 67 year old female who presented to [facility] on 12/31/22 for elective left THR. PMH significant for breast CA    PT Comments  Pt progressing towards physical therapy goals.  Was able to perform transfers and ambulation with supervision to contact guard assist and RW for support. Pt able to make corrective changes to gait deviations and demonstrate a smooth gait pattern by end of gait training. Pt anticipates d/c home today. Will continue to follow and progress as able per POC.    If plan is discharge home, recommend the following: A little help with walking and/or transfers;A little help with bathing/dressing/bathroom;Help with stairs or ramp for entrance;Assist for transportation;Assistance with cooking/housework   Can travel by private Automotive [employer] (2 wheels)    Recommendations for Other Services       Precautions / Restrictions Precautions Precautions: None Precaution Booklet Issued: Yes (comment) Precaution Comments: Reviewed exercise handout Restrictions Weight Bearing Restrictions: Yes LLE Weight Bearing: Weight bearing as tolerated     Mobility  Bed Mobility               General bed mobility comments: Pt was received sitting up in the recliner.    Transfers Overall transfer level: Modified independent Equipment used: Rolling walker (2 wheels) Transfers: Sit to/from Stand             General transfer comment: Pt demonstrated good hand placement on seated surface for safety.    Ambulation/Gait Ambulation/Gait assistance: Supervision Gait Distance (Feet): 300 Feet Assistive device: Rolling walker (2 wheels) Gait Pattern/deviations: Step-through pattern, Decreased stride length, Decreased weight shift to left Gait velocity: Decreased Gait velocity  interpretation: 1.31 - 2.62 ft/sec, indicative of limited community ambulator   General Gait Details: VC's for improved heel strike and equal step/stride length R and L. Pt able to make corrective changes and demonstrated a good fluid gait cycle with the RW for support.   Stairs Stairs: Yes Stairs assistance: Contact guard assist Stair Management: One rail Left, Step to pattern, Forwards Number of Stairs: 10 General stair comments: VC's for sequencing and general safety.   Wheelchair Mobility     Tilt Bed    Modified Rankin (Stroke Patients Only)       Balance Overall balance assessment: Needs assistance Sitting-balance support: Feet supported Sitting balance-Leahy Scale: Good     Standing balance support: Reliant on assistive device for balance Standing balance-Leahy Scale: Fair                              Cognition Arousal: Alert Behavior During Therapy: WFL for tasks assessed/performed Overall Cognitive Status: Within Functional Limits for tasks assessed                                          Exercises Total Joint Exercises Ankle Circles/Pumps: 5 reps Quad Sets: 5 reps Short Arc Quad: 5 reps Heel Slides: 5 reps Hip ABduction/ADduction: 5 reps Long Arc Quad: 5 reps Marching in Standing: 5 reps Standing Hip Extension: 5 reps    General Comments        Pertinent Vitals/Pain Pain Assessment Pain Assessment: Faces Faces Pain Scale:  Hurts little more Pain Location: lt hip Pain Descriptors / Indicators: Tender, Sore Pain Intervention(s): Monitored during session, Limited activity within patient's tolerance, Repositioned    Home Living                          Prior Function            PT Goals (current goals can now be found in the care plan section) Acute Rehab PT Goals Patient Stated Goal: Back to the gym PT Goal Formulation: With patient Time For Goal Achievement: 01/07/23 Potential to Achieve Goals:  Good Progress towards PT goals: Progressing toward goals    Frequency    7X/week      PT Plan      Co-evaluation              AM-PAC PT "6 Clicks" Mobility   Outcome Measure  Help needed turning from your back to your side while in a flat bed without using bedrails?: A Little Help needed moving from lying on your back to sitting on the side of a flat bed without using bedrails?: A Little Help needed moving to and from a bed to a chair (including a wheelchair)?: A Little Help needed standing up from a chair using your arms (e.g., wheelchair or bedside chair)?: A Little Help needed to walk in hospital room?: A Little Help needed climbing 3-5 steps with a railing? : A Little 6 Click Score: 18    End of Session Equipment Utilized During Treatment: Gait belt Activity Tolerance: Patient tolerated treatment well Patient left: in chair;with call bell/phone within reach;with family/visitor present Nurse Communication: Mobility status PT Visit Diagnosis: Other abnormalities of gait and mobility (R26.89);Pain Pain - Right/Left: Left Pain - part of body: Hip     Time: 1610-9604 PT Time Calculation (min) (ACUTE ONLY): 31 min  Charges:    $Gait Training: 8-22 mins $Therapeutic Exercise: 8-22 mins PT General Charges $$ ACUTE PT VISIT: 1 Visit                     Conni Slipper, PT, DPT Acute Rehabilitation Services Secure Chat Preferred Office: [phone]    Marylynn Pearson 01/01/2023, 3:00 PM

## 2023-01-01 NOTE — Progress Notes (Signed)
Occupational Therapy Treatment Patient Details Name: Jamie [name] MRN: [id number] DOB: 1955/09/16 Today's Date: 01/01/2023   History of present illness Pt is 67 year old presented to [facility] on  12/31/22 for lt THR. PMH - breast CA   OT comments  Patient feeling much better today, and is Mod I for ADL completion with [employer], which she has at home, and Mod I for in room mobility/toileting.  Patient needing RW management cues and cues to push from solid surface not RW for sit to stand.  No further OT needs in the acute setting, and no post acute OT needed.        If plan is discharge home, recommend the following:  Assist for transportation;A little help with bathing/dressing/bathroom;Assistance with cooking/housework   Equipment Recommendations  None recommended by OT    Recommendations for Other Services      Precautions / Restrictions Precautions Precautions: None Restrictions Weight Bearing Restrictions: Yes LLE Weight Bearing: Weight bearing as tolerated       Mobility Bed Mobility   Bed Mobility: Supine to Sit     Supine to sit: Modified independent (Device/Increase time)          Transfers Overall transfer level: Modified independent Equipment used: Rolling walker (2 wheels)               General transfer comment: Verbal cues for hand placement and RW management     Balance Overall balance assessment: Needs assistance Sitting-balance support: Feet supported Sitting balance-Leahy Scale: Good     Standing balance support: Reliant on assistive device for balance Standing balance-Leahy Scale: Fair                             ADL either performed or assessed with clinical judgement   ADL Overall ADL's : Modified independent                                            Extremity/Trunk Assessment Upper Extremity Assessment Upper Extremity Assessment: Overall WFL for tasks assessed   Lower Extremity Assessment Lower  Extremity Assessment: Defer to PT evaluation   Cervical / Trunk Assessment Cervical / Trunk Assessment: Normal    Vision Patient Visual Report: No change from baseline     Perception Perception Perception: Within Functional Limits   Praxis Praxis Praxis: WFL    Cognition Arousal: Alert Behavior During Therapy: WFL for tasks assessed/performed Overall Cognitive Status: Within Functional Limits for tasks assessed                                          Exercises      Shoulder Instructions       General Comments  VSS    Pertinent Vitals/ Pain       Pain Assessment Pain Assessment: Faces Faces Pain Scale: Hurts little more Pain Location: lt hip Pain Descriptors / Indicators: Tender, Sore Pain Intervention(s): Monitored during session  Frequency           Progress Toward Goals  OT Goals(current goals can now be found in the care plan section)  Progress towards OT goals: Goals met/education completed, patient discharged from OT  Acute Rehab OT Goals OT Goal Formulation: With patient Time For Goal Achievement: 01/14/23 Potential to Achieve Goals: Good  Plan      Co-evaluation                 AM-PAC OT "6 Clicks" Daily Activity     Outcome Measure   Help from another person eating meals?: None Help from another person taking care of personal grooming?: None Help from another person toileting, which includes using toliet, bedpan, or urinal?: None Help from another person bathing (including washing, rinsing, drying)?: None Help from another person to put on and taking off regular upper body clothing?: None Help from another person to put on and taking off regular lower body clothing?: None 6 Click Score: 24    End of Session Equipment Utilized During Treatment: Rolling walker (2 wheels)  OT Visit Diagnosis: Pain Pain - Right/Left: Left Pain -  part of body: Hip   Activity Tolerance     Patient Left in chair;with call bell/phone within reach   Nurse Communication Mobility status        Time: 5621-3086 OT Time Calculation (min): 18 min  Charges: OT General Charges $OT Visit: 1 Visit OT Treatments $Self Care/Home Management : 8-22 mins  01/01/2023  RP, OTR/L  Acute Rehabilitation Services  Office:  [phone]   Suzanna Obey 01/01/2023, 9:06 AM

## 2023-01-01 NOTE — Progress Notes (Signed)
Subjective: 1 Day Post-Op Procedure(s) (LRB): LEFT TOTAL HIP REPLACEMENT (Left) Patient reports pain as mild.    Objective: Vital signs in last 24 hours: Temp:  [97.1 F (36.2 C)-99.2 F (37.3 C)] 99.2 F (37.3 C) (08/13 0743) Pulse Rate:  [55-91] 84 (08/13 0743) Resp:  [9-20] 16 (08/13 0743) BP: (92-129)/(57-74) 112/59 (08/13 0743) SpO2:  [94 %-100 %] 96 % (08/13 0743)  Intake/Output from previous day: 08/12 0701 - 08/13 0700 In: 3010 [P.O.:960; I.V.:1600; IV Piggyback:450] Out: 2750 [Urine:2600; Blood:150] Intake/Output this shift: No intake/output data recorded.  Recent Labs    01/01/23 0022  HGB 10.4*   Recent Labs    01/01/23 0022  WBC 16.5*  RBC 3.29*  HCT 30.6*  PLT 244   No results for input(s): "NA", "K", "CL", "CO2", "BUN", "CREATININE", "GLUCOSE", "CALCIUM" in the last 72 hours. No results for input(s): "LABPT", "INR" in the last 72 hours.  Neurologically intact Neurovascular intact Sensation intact distally Intact pulses distally Dorsiflexion/Plantar flexion intact Incision: dressing C/D/I No cellulitis present Compartment soft   Assessment/Plan: 1 Day Post-Op Procedure(s) (LRB): LEFT TOTAL HIP REPLACEMENT (Left) Advance diet Up with therapy D/C IV fluids Discharge home with home health once cleared by PT WBAT LLE ABLA- mild and stable     Cristie Hem 01/01/2023, 8:02 AM

## 2023-01-01 NOTE — Progress Notes (Signed)
Patient alert and oriented, ambulate void. D/c instructions explain and given,surgical site clean and dry no sign of infection. Patient d/c home per order.

## 2023-01-02 ENCOUNTER — Encounter: Payer: Self-pay | Admitting: Orthopaedic Surgery

## 2023-01-02 ENCOUNTER — Telehealth: Payer: Self-pay | Admitting: *Deleted

## 2023-01-02 ENCOUNTER — Telehealth: Payer: Self-pay | Admitting: Orthopaedic Surgery

## 2023-01-02 NOTE — Telephone Encounter (Signed)
Ralph Dowdy called and spoke with therapist.

## 2023-01-02 NOTE — Telephone Encounter (Signed)
Ortho bundle D/C call completed. 

## 2023-01-02 NOTE — Telephone Encounter (Signed)
Nynica Bobbitt called in and needs a verbal order for PT. 3x a week for 3wks. She has question about medication. Also can she get an order of [employer].CB#[phone]

## 2023-01-04 ENCOUNTER — Other Ambulatory Visit: Payer: Self-pay | Admitting: Physician Assistant

## 2023-01-04 ENCOUNTER — Telehealth: Payer: Self-pay | Admitting: *Deleted

## 2023-01-04 MED ORDER — OXYCODONE-ACETAMINOPHEN 5-325 MG PO TABS: 1.0000 | ORAL_TABLET | Freq: Four times a day (QID) | ORAL | 0 refills | Status: DC | PRN

## 2023-01-04 NOTE — Telephone Encounter (Signed)
Patient is s/p Left total hip per Dr. Roda Shutters on 12/31/22. She called requesting refill of pain medication prior to the weekend. Would you be ok for sending a refill since Dr. Roda Shutters and Mardella Layman are out today? Thank you.

## 2023-01-08 ENCOUNTER — Encounter: Payer: Medicare Other | Admitting: Orthopaedic Surgery

## 2023-01-10 ENCOUNTER — Telehealth: Payer: Self-pay | Admitting: *Deleted

## 2023-01-10 NOTE — Telephone Encounter (Signed)
Spoke with patient about pain medication. She is taking Oxycodone pretty consistently still (mainly 1 tablet during the day every 6 hours), but does take 2 at night occasionally. Having some nausea here and there and asked what else she could take for pain that has fewer side effects. We discussed stepping down to Norco and she'd like to try this if possible. Or any other recommendations? Thank you.

## 2023-01-11 MED ORDER — TRAMADOL HCL 50 MG PO TABS
50.0000 mg | ORAL_TABLET | Freq: Every day | ORAL | 0 refills | Status: DC | PRN
Start: 2023-01-11 — End: 2023-06-28

## 2023-01-11 NOTE — Telephone Encounter (Signed)
I think it says daily as needed.

## 2023-01-11 NOTE — Telephone Encounter (Signed)
Sent tramadol

## 2023-01-13 ENCOUNTER — Encounter: Payer: Self-pay | Admitting: Orthopaedic Surgery

## 2023-01-15 ENCOUNTER — Ambulatory Visit (INDEPENDENT_AMBULATORY_CARE_PROVIDER_SITE_OTHER): Payer: Medicare Other | Admitting: Physician Assistant

## 2023-01-15 ENCOUNTER — Encounter: Payer: Self-pay | Admitting: Physician Assistant

## 2023-01-15 DIAGNOSIS — Z96642 Presence of left artificial hip joint: Secondary | ICD-10-CM

## 2023-01-15 MED ORDER — PREDNISONE 10 MG (21) PO TBPK: ORAL_TABLET | ORAL | 0 refills | Status: DC

## 2023-01-15 MED ORDER — OXYCODONE-ACETAMINOPHEN 5-325 MG PO TABS: 1.0000 | ORAL_TABLET | Freq: Four times a day (QID) | ORAL | 0 refills | Status: DC | PRN

## 2023-01-15 MED ORDER — METHOCARBAMOL 750 MG PO TABS: 750.0000 mg | ORAL_TABLET | Freq: Two times a day (BID) | ORAL | 2 refills | Status: DC | PRN

## 2023-01-15 NOTE — Progress Notes (Signed)
Post-Op Visit Note   Patient: Jamie [name]           Date of Birth: 01-27-1956           MRN: [id number] Visit Date: 01/15/2023 PCP: Jeani Sow, MD   Assessment & Plan:  Chief Complaint:  Chief Complaint  Patient presents with   Left Hip - Follow-up    Left total hip arthroplasty 12/31/2022   Visit Diagnoses:  1. Status post total replacement of left hip     Plan: Patient is a pleasant 67 year old female who comes in today 2 weeks status post left total hip replacement 12/31/2022.  She has been doing well.  She has been getting home health physical therapy and would like to transition to outpatient physical therapy at this time.  She has been taking oxycodone and Robaxin for pain and cramping.  She has noticed increased pain to the left buttock with paresthesias to the left thigh.  She has been compliant using the Lovenox for DVT prophylaxis.  Examination of the left hip reveals a fully healed surgical scar without complication.  Nylon sutures in place.  Calves are soft nontender.  She is neurovascularly intact distally.  At this point, I believe she has aggravated her back and I will send in a steroid taper.  I refilled her oxycodone and Robaxin.  She would like to get outpatient physical therapy and a referral has been made.  Once she has finished her lovenox, she will take a baby aspirin twice daily x 2 weeks.  Follow up in 4 weeks for recheck and ap pelvis xrays.   Follow-Up Instructions: Return in about 4 weeks (around 02/12/2023).   Orders:  Orders Placed This Encounter  Procedures   Ambulatory referral to Physical Therapy   Meds ordered this encounter  Medications   methocarbamol (ROBAXIN-750) 750 MG tablet    Sig: Take 1 tablet (750 mg total) by mouth 2 (two) times daily as needed for muscle spasms.    Dispense:  20 tablet    Refill:  2   oxyCODONE-acetaminophen (PERCOCET) 5-325 MG tablet    Sig: Take 1 tablet by mouth every 6 (six) hours as needed. To be taken  after surgery    Dispense:  40 tablet    Refill:  0   predniSONE (STERAPRED UNI-PAK 21 TAB) 10 MG (21) TBPK tablet    Sig: Take as directed    Dispense:  21 tablet    Refill:  0    Imaging: No new imaging  PMFS History: Patient Active Problem List   Diagnosis Date Noted   Status post total replacement of left hip 12/31/2022   Prediabetes 11/13/2022   Chronic left hip pain 05/08/2022   Family history of coronary arteriosclerosis 10/13/2021   Primary osteoarthritis of left hip 03/23/2021   Family history of osteoporosis 06/12/2018   History of breast cancer 06/12/2018   Advance directive discussed with patient 04/03/2018   Pap smear of cervix declined 04/03/2018   Lumbago 08/16/2017   Basal cell carcinoma 07/17/2017   Vaginismus 07/17/2017   Abnormal TSH 03/28/2017   Diarrhea 11/05/2016   Port catheter in place 10/23/2016   Genetic testing 08/15/2016   Malignant neoplasm of upper-outer quadrant of left breast in female, estrogen receptor positive (HCC) 07/10/2016   Allergic rhinitis 06/13/2016   History of basal cell carcinoma 06/13/2016   Eczema 06/13/2016   Hyperlipemia 06/13/2016   History of colon polyps 06/13/2016   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 Bula underwent genetic testing for hereditary cancer syndrome through [facility]'s 43-gene Common Hereditary Cancers Panel. Ms. 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 Result report is dated 08/15/2016. Please see genetic counseling documentation from  08/17/2016 for further discussion.   History of radiation therapy 01/02/17-01/31/17   left breast was treated to 42.72 Gy in 16 fractions, left breast boost 10 Gy in 5 fractions   Hyperlipidemia    Personal history of chemotherapy    Personal history of radiation therapy    Pneumonia    2000s   PONV (postoperative nausea and vomiting)     Family History  Problem Relation Age of Onset   Hyperlipidemia Mother    Ovarian cancer Mother 53   Asthma Mother    Cancer Mother    Hyperlipidemia Father    Cancer Father    Hypertension Father    Stroke Father    Heart disease Father    Depression Father    Heart attack Father    Hyperlipidemia Sister    Arthritis Sister    Osteoporosis Sister    Breast cancer Maternal Aunt 71       recurred at 68   Liver cancer Maternal Uncle 77   Heart attack Maternal Grandmother    Heart attack Paternal Grandmother    Heart disease Paternal Grandmother    Colon cancer Neg Hx    Colon polyps Neg Hx    Esophageal cancer Neg Hx    Rectal cancer Neg Hx    Stomach cancer Neg Hx     Past Surgical History:  Procedure Laterality Date   BONE SPUR Bilateral 2001 AND 1988   BREAST BIOPSY     BREAST LUMPECTOMY Left    07/2016   BREAST LUMPECTOMY WITH RADIOACTIVE SEED AND SENTINEL LYMPH NODE BIOPSY Left 07/26/2016   Procedure: BREAST LUMPECTOMY WITH RADIOACTIVE SEED AND SENTINEL LYMPH NODE BIOPSY;  Surgeon: Almond Lint, MD;  Location: [facility];  Service: General;  Laterality: Left;   BUNIONECTOMY Bilateral 02/2012   COLONOSCOPY W/ POLYPECTOMY  08/2007   DILATION AND CURETTAGE OF UTERUS  2002   MOHS SURGERY  2010   PORT-A-CATH REMOVAL N/A 07/18/2017   Procedure: REMOVAL PORT-A-CATH;  Surgeon: Almond Lint, MD;  Location: [facility] OR;  Service: General;  Laterality: N/A;   PORTACATH PLACEMENT Right 07/26/2016   Procedure: INSERTION PORT-A-CATH;  Surgeon: Almond Lint, MD;  Location: [facility];  Service: General;  Laterality: Right;    TOTAL HIP ARTHROPLASTY Left 12/31/2022   Procedure: LEFT TOTAL HIP REPLACEMENT;  Surgeon: Tarry Kos, MD;  Location: [facility] OR;  Service: Orthopedics;  Laterality: Left;  3-C   Social History   Occupational History   Occupation: retired  Tobacco Use   Smoking status: Never    Passive exposure: Never   Smokeless tobacco: Never  Vaping Use   Vaping status: Never Used  Substance and Sexual Activity  Alcohol use: Not Currently    Comment: occasionally   Drug use: No   Sexual activity: Yes    Birth control/protection: Post-menopausal

## 2023-01-21 NOTE — Therapy (Unsigned)
OUTPATIENT PHYSICAL THERAPY LOWER EXTREMITY EVALUATION   Patient Name: Jamie [name] MRN: [id number] DOB:May 18, 1956, 67 y.o., female Today's Date: 01/22/2023  END OF SESSION:  PT End of Session - 01/22/23 1729     Visit Number 1    Number of Visits 24    Date for PT Re-Evaluation 04/16/23    Authorization Type medicare and [employer]    Progress Note Due on Visit 10    PT Start Time 1430    PT Stop Time 1519    PT Time Calculation (min) 49 min    Equipment Utilized During Treatment Other (comment)   none   Activity Tolerance Patient tolerated treatment well             Past Medical History:  Diagnosis Date   Allergy    Anemia    Hx   Anxiety    Occasionally   Arthritis    feet   Basal cell carcinoma    Breast cancer (HCC) 06/2016   left breast   Colon polyps    Depression    Eczema    Genetic testing 08/15/2016   Ms. Tritschler underwent genetic testing for hereditary cancer syndrome through [facility]'s 43-gene Common Hereditary Cancers Panel. Ms. 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 Result report is dated 08/15/2016. Please see genetic counseling documentation from 08/17/2016 for further discussion.   History of radiation therapy 01/02/17-01/31/17   left breast was treated to 42.72 Gy in 16 fractions, left breast boost 10 Gy in 5 fractions   Hyperlipidemia    Personal history of chemotherapy    Personal history of radiation therapy    Pneumonia    2000s   PONV (postoperative nausea and vomiting)    Past Surgical History:  Procedure Laterality Date   BONE SPUR Bilateral 2001 AND 1988   BREAST BIOPSY     BREAST LUMPECTOMY Left    07/2016   BREAST LUMPECTOMY WITH RADIOACTIVE SEED AND SENTINEL LYMPH NODE BIOPSY Left 07/26/2016   Procedure: BREAST LUMPECTOMY WITH RADIOACTIVE SEED AND SENTINEL LYMPH NODE BIOPSY;  Surgeon: Almond Lint, MD;  Location: [facility];  Service: General;   Laterality: Left;   BUNIONECTOMY Bilateral 02/2012   COLONOSCOPY W/ POLYPECTOMY  08/2007   DILATION AND CURETTAGE OF UTERUS  2002   MOHS SURGERY  2010   PORT-A-CATH REMOVAL N/A 07/18/2017   Procedure: REMOVAL PORT-A-CATH;  Surgeon: Almond Lint, MD;  Location: [facility];  Service: General;  Laterality: N/A;   PORTACATH PLACEMENT Right 07/26/2016   Procedure: INSERTION PORT-A-CATH;  Surgeon: Almond Lint, MD;  Location: [facility];  Service: General;  Laterality: Right;   TOTAL HIP ARTHROPLASTY Left 12/31/2022   Procedure: LEFT TOTAL HIP REPLACEMENT;  Surgeon: Tarry Kos, MD;  Location: [facility];  Service: Orthopedics;  Laterality: Left;  3-C   Patient Active Problem List   Diagnosis Date Noted   Status post total replacement of left hip 12/31/2022   Prediabetes 11/13/2022   Chronic left hip pain 05/08/2022   Family history of coronary arteriosclerosis 10/13/2021   Primary osteoarthritis of left hip 03/23/2021   Family history of osteoporosis 06/12/2018   History of breast cancer 06/12/2018   Advance directive discussed with patient 04/03/2018   Pap smear of cervix declined 04/03/2018   Lumbago 08/16/2017   Basal cell carcinoma 07/17/2017   Vaginismus 07/17/2017   Abnormal TSH 03/28/2017   Diarrhea 11/05/2016   Port  catheter in place 10/23/2016   Genetic testing 08/15/2016   Malignant neoplasm of upper-outer quadrant of left breast in female, estrogen receptor positive (HCC) 07/10/2016   Allergic rhinitis 06/13/2016   History of basal cell carcinoma 06/13/2016   Eczema 06/13/2016   Hyperlipemia 06/13/2016   History of colon polyps 06/13/2016   Plantar fascial fibromatosis 06/13/2016   RLS (restless legs syndrome) 06/13/2016   Subclinical hypothyroidism 07/20/2013   Bunion 01/21/2013   Osteopenia 01/20/2013    PCP: Dr Dewayne Hatch [place] [facility]  REFERRING PROVIDER: Dr Tarry Kos  REFERRING DIAG: s/p L THA  THERAPY DIAG:  Pain in left hip  Muscle weakness  (generalized)  Stiffness of left hip, not elsewhere classified  Other symptoms and signs involving the musculoskeletal system  Other abnormalities of gait and mobility  Rationale for Evaluation and Treatment: Rehabilitation  ONSET DATE: sx 12/31/22  SUBJECTIVE:   SUBJECTIVE STATEMENT: L hip pain for a few years with gradual progression of symptoms. She has significant increase in symptoms in the past 1.5 years. MRI showed degenerative changes L hip. Underwent L THA anterior approach 12/31/22 with no complications.   PERTINENT HISTORY: Breast cancer 2018; chronic L hip pain PAIN:  Are you having pain? Yes: NPRS scale: 3-4/10 Pain location: L hip  Pain description: constant ache Aggravating factors: sitting; moving sit to stand   Relieving factors: meds; ice; avoiding activities   PRECAUTIONS: Anterior hip  RED FLAGS: None   WEIGHT BEARING RESTRICTIONS: No  FALLS:  Has patient fallen in last 6 months? 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  Walking; gym 4 days/wk; sewing; church   PLOF: Independent  PATIENT GOALS: get rid of the cane walking; return to normal activities including gym programs  NEXT MD VISIT: 02/12/23  OBJECTIVE:   DIAGNOSTIC FINDINGS: xray 10/23/22 Spurring of the femoral head consistent with advanced left hip  degenerative joint disease  PATIENT SURVEYS:  FOTO 19; goal 53  COGNITION: Overall cognitive status: Within functional limits for tasks assessed     SENSATION: Numbness L lateral hip area   EDEMA:  Mild in L lateral hip   MUSCLE LENGTH: Hamstrings: Right 75 deg; Left 65 deg Thomas test - modified: Left 0 deg  POSTURE:  rounded shoulders, forward head, flexed trunk , and weight shift right  PALPATION: Tightness anterior L thigh through quads/hip flexors   LOWER EXTREMITY ROM:  Active Assistive ROM Right eval Left eval  Hip flexion WNL 90  Hip extension WNL 0  Hip abduction WNL Limited   Hip adduction    Hip internal rotation    Hip external rotation    Knee flexion WNL WNL  Knee extension WNL WNL  Ankle dorsiflexion    Ankle plantarflexion    Ankle inversion    Ankle eversion     (Blank rows = not tested)  LOWER EXTREMITY MMT: L LE not tested resistively - lifting L LE well against gravity   MMT Right eval Left eval  Hip flexion 5   Hip extension 5   Hip abduction 5   Hip adduction    Hip internal rotation    Hip external rotation    Knee flexion    Knee  extension    Ankle dorsiflexion    Ankle plantarflexion    Ankle inversion    Ankle eversion     (Blank rows = not tested)   GAIT: Distance walked: 40 feet Assistive device utilized: Single point cane Level of assistance: Complete Independence Comments: slowed gait;  good gait pattern    [facility] Adult PT Treatment:                                                DATE: 01/22/23 Therapeutic Exercise: Supine  Quad set - from HEP  SLR partial range -  from HEP  SAQ -  from HEP  Hip flexor stretch L LE off edge of table PT guiding LE and pt using stretch out strap 20 sec x 2  Sitting  Anterior posterior pelvic tilt  2-3 sec x 5  Standing Weight shift side to side Weight shift stagger stance frd/back  Wall squat 10 sec x 5  Manual Therapy: Myofacial work anterior L thigh  Neuromuscular re-ed: Standing weight shifts  Gait: SPC step through gait pattern  Modalities: Has TENS unit at home    PATIENT EDUCATION:  Education details: POC; HEP Person educated: Patient Education method: Programmer, multimedia, Facilities manager, Actor cues, Verbal cues, and Handouts Education comprehension: verbalized understanding, returned  demonstration, verbal cues required, tactile cues required, and needs further education  HOME EXERCISE PROGRAM: Access Code: [email or web address] URL: [email or web address] Date: 01/22/2023 Prepared by: Corlis Leak  Exercises - Supine Transversus Abdominis Bracing with Pelvic Floor Contraction  - 2 x daily - 7 x weekly - 1 sets - 10 reps - 10sec  hold - Seated Anterior Pelvic Tilt  - 2 x daily - 7 x weekly - 1 sets - 10 reps - 2-3 sec  hold - Hip Flexor Stretch at Edge of Bed  - 2 x daily - 7 x weekly - 1 sets - 3 reps - 30 sec  hold - Wall Quarter Squat  - 2 x daily - 7 x weekly - 1-2 sets - 10 reps - 5-10 sec  hold  ASSESSMENT:  CLINICAL IMPRESSION: Patient is a 67 y.o. female who was seen today for physical therapy evaluation and treatment s/p L THA 12/31/22. She presents with decreased L LE ROM, strength, function; abnormal gait pattern; limited functional activities; inability to participate in recreational activities and community activities. Patient will benefit from PT to address problems identified.   OBJECTIVE IMPAIRMENTS: Abnormal gait, decreased activity tolerance, decreased balance, decreased mobility, difficulty walking, decreased ROM, decreased strength, hypomobility, increased fascial restrictions, increased muscle spasms, impaired flexibility, improper body mechanics, postural dysfunction, and pain.   ACTIVITY LIMITATIONS: carrying, lifting, bending, sitting, standing, squatting, sleeping, stairs, transfers, bed mobility, toileting, dressing, and locomotion level  PARTICIPATION LIMITATIONS: meal prep, cleaning, laundry, driving, shopping, community activity, and yard work  PERSONAL FACTORS: Age, Past/current experiences, and Time since onset of injury/illness/exacerbation are also affecting patient's functional outcome.   REHAB POTENTIAL: Good  CLINICAL DECISION MAKING: Stable/uncomplicated  EVALUATION COMPLEXITY: Low   GOALS: Goals reviewed with patient?  Yes  SHORT TERM GOALS: Target date: 03/05/2023   Independent in initial HEP  Baseline: Goal status: INITIAL  2.  Independent gait without assistive device in home and short distance level surfaces ~ 100 ft  Baseline:  Goal status: INITIAL  LONG TERM GOALS: Target date: 04/16/2023   AROM L hip  WFL's and pain free Baseline:  Goal status: INITIAL  2.  5/5 strength L LE  Baseline:  Goal status: INITIAL  3.  Normal gait pattern with no assistive device for community distances ~ 400-500 ft  Baseline:  Goal status: INITIAL  4.  Independent in HEP including aquatic exercise program  Baseline:  Goal status: INITIAL  5.  Improve functional limitation score to 53 Baseline:  Goal status: INITIAL  6.  Return to community based recreational activities including gym program and walking  Baseline:  Goal status: INITIAL   PLAN:  PT FREQUENCY: 2x/week  PT DURATION: 12 weeks  PLANNED INTERVENTIONS: Therapeutic exercises, Therapeutic activity, Neuromuscular re-education, Balance training, Gait training, Patient/Family education, Self Care, Joint mobilization, Stair training, DME instructions, Aquatic Therapy, Dry Needling, Electrical stimulation, Spinal mobilization, Cryotherapy, Moist heat, Taping, Ultrasound, Ionotophoresis 4mg /ml Dexamethasone, Manual therapy, and Re-evaluation  PLAN FOR NEXT SESSION: review and progress exercise; continue eduction re-post op precautions following THA; manual work, DN, modalities as indicated    [employer], PT 01/22/2023, 5:31 PM

## 2023-01-22 ENCOUNTER — Ambulatory Visit: Payer: Medicare Other | Attending: Orthopaedic Surgery | Admitting: Rehabilitative and Restorative Service Providers"

## 2023-01-22 ENCOUNTER — Encounter: Payer: Self-pay | Admitting: Rehabilitative and Restorative Service Providers"

## 2023-01-22 ENCOUNTER — Other Ambulatory Visit: Payer: Self-pay

## 2023-01-22 DIAGNOSIS — M25652 Stiffness of left hip, not elsewhere classified: Secondary | ICD-10-CM | POA: Insufficient documentation

## 2023-01-22 DIAGNOSIS — Z96642 Presence of left artificial hip joint: Secondary | ICD-10-CM | POA: Insufficient documentation

## 2023-01-22 DIAGNOSIS — M25552 Pain in left hip: Secondary | ICD-10-CM | POA: Insufficient documentation

## 2023-01-22 DIAGNOSIS — R2689 Other abnormalities of gait and mobility: Secondary | ICD-10-CM | POA: Diagnosis not present

## 2023-01-22 DIAGNOSIS — R29898 Other symptoms and signs involving the musculoskeletal system: Secondary | ICD-10-CM | POA: Diagnosis not present

## 2023-01-22 DIAGNOSIS — M6281 Muscle weakness (generalized): Secondary | ICD-10-CM | POA: Diagnosis not present

## 2023-01-22 DIAGNOSIS — Z471 Aftercare following joint replacement surgery: Secondary | ICD-10-CM | POA: Insufficient documentation

## 2023-01-23 ENCOUNTER — Encounter: Payer: Self-pay | Admitting: Orthopaedic Surgery

## 2023-01-24 ENCOUNTER — Encounter: Payer: Self-pay | Admitting: Rehabilitative and Restorative Service Providers"

## 2023-01-24 ENCOUNTER — Ambulatory Visit: Payer: Medicare Other | Admitting: Rehabilitative and Restorative Service Providers"

## 2023-01-24 DIAGNOSIS — M25652 Stiffness of left hip, not elsewhere classified: Secondary | ICD-10-CM

## 2023-01-24 DIAGNOSIS — M25552 Pain in left hip: Secondary | ICD-10-CM

## 2023-01-24 DIAGNOSIS — R2689 Other abnormalities of gait and mobility: Secondary | ICD-10-CM

## 2023-01-24 DIAGNOSIS — Z471 Aftercare following joint replacement surgery: Secondary | ICD-10-CM | POA: Diagnosis not present

## 2023-01-24 DIAGNOSIS — R29898 Other symptoms and signs involving the musculoskeletal system: Secondary | ICD-10-CM

## 2023-01-24 DIAGNOSIS — M6281 Muscle weakness (generalized): Secondary | ICD-10-CM

## 2023-01-24 NOTE — Therapy (Signed)
OUTPATIENT PHYSICAL THERAPY LOWER EXTREMITY TREATMENT    Patient Name: Jamie [name] MRN: [id number] DOB:14-Jul-1955, 67 y.o., female Today's Date: 01/24/2023  END OF SESSION:  PT End of Session - 01/24/23 1403     Visit Number 2    Number of Visits 24    Date for PT Re-Evaluation 04/16/23    Authorization Type medicare and [employer]    Progress Note Due on Visit 10    PT Start Time 1400    PT Stop Time 1448    PT Time Calculation (min) 48 min             Past Medical History:  Diagnosis Date   Allergy    Anemia    Hx   Anxiety    Occasionally   Arthritis    feet   Basal cell carcinoma    Breast cancer (HCC) 06/2016   left breast   Colon polyps    Depression    Eczema    Genetic testing 08/15/2016   Jamie [name] underwent genetic testing for hereditary cancer syndrome through [facility]'s 43-gene Common Hereditary Cancers Panel. 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 Result report is dated 08/15/2016. Please see genetic counseling documentation from 08/17/2016 for further discussion.   History of radiation therapy 01/02/17-01/31/17   left breast was treated to 42.72 Gy in 16 fractions, left breast boost 10 Gy in 5 fractions   Hyperlipidemia    Personal history of chemotherapy    Personal history of radiation therapy    Pneumonia    2000s   PONV (postoperative nausea and vomiting)    Past Surgical History:  Procedure Laterality Date   BONE SPUR Bilateral 2001 AND 1988   BREAST BIOPSY     BREAST LUMPECTOMY Left    07/2016   BREAST LUMPECTOMY WITH RADIOACTIVE SEED AND SENTINEL LYMPH NODE BIOPSY Left 07/26/2016   Procedure: BREAST LUMPECTOMY WITH RADIOACTIVE SEED AND SENTINEL LYMPH NODE BIOPSY;  Surgeon: Almond Lint, MD;  Location: [facility];  Service: General;  Laterality: Left;   BUNIONECTOMY Bilateral 02/2012   COLONOSCOPY W/ POLYPECTOMY  08/2007   DILATION AND CURETTAGE OF  UTERUS  2002   MOHS SURGERY  2010   PORT-A-CATH REMOVAL N/A 07/18/2017   Procedure: REMOVAL PORT-A-CATH;  Surgeon: Almond Lint, MD;  Location: [facility];  Service: General;  Laterality: N/A;   PORTACATH PLACEMENT Right 07/26/2016   Procedure: INSERTION PORT-A-CATH;  Surgeon: Almond Lint, MD;  Location: [facility];  Service: General;  Laterality: Right;   TOTAL HIP ARTHROPLASTY Left 12/31/2022   Procedure: LEFT TOTAL HIP REPLACEMENT;  Surgeon: Tarry Kos, MD;  Location: [facility];  Service: Orthopedics;  Laterality: Left;  3-C   Patient Active Problem List   Diagnosis Date Noted   Status post total replacement of left hip 12/31/2022   Prediabetes 11/13/2022   Chronic left hip pain 05/08/2022   Family history of coronary arteriosclerosis 10/13/2021   Primary osteoarthritis of left hip 03/23/2021   Family history of osteoporosis 06/12/2018   History of breast cancer 06/12/2018   Advance directive discussed with patient 04/03/2018   Pap smear of cervix declined 04/03/2018   Lumbago 08/16/2017   Basal cell carcinoma 07/17/2017   Vaginismus 07/17/2017   Abnormal TSH 03/28/2017   Diarrhea 11/05/2016   Port catheter in place 10/23/2016   Genetic testing 08/15/2016   Malignant neoplasm of upper-outer quadrant of left breast  in female, estrogen receptor positive (HCC) 07/10/2016   Allergic rhinitis 06/13/2016   History of basal cell carcinoma 06/13/2016   Eczema 06/13/2016   Hyperlipemia 06/13/2016   History of colon polyps 06/13/2016   Plantar fascial fibromatosis 06/13/2016   RLS (restless legs syndrome) 06/13/2016   Subclinical hypothyroidism 07/20/2013   Bunion 01/21/2013   Osteopenia 01/20/2013    PCP: Dr Dewayne Hatch [place] [facility]  REFERRING PROVIDER: Dr Tarry Kos  REFERRING DIAG: s/p L THA  THERAPY DIAG:  Pain in left hip  Muscle weakness (generalized)  Stiffness of left hip, not elsewhere classified  Other symptoms and signs involving the musculoskeletal  system  Other abnormalities of gait and mobility  Rationale for Evaluation and Treatment: Rehabilitation  ONSET DATE: sx 12/31/22  SUBJECTIVE:   SUBJECTIVE STATEMENT: Sore and painful today. Did not sleep as well last night. Using the TENS at home. No longer taking the OxyContin.    Eval: L hip pain for a few years with gradual progression of symptoms. She has significant increase in symptoms in the past 1.5 years. MRI showed degenerative changes L hip. Underwent L THA anterior approach 12/31/22 with no complications.   PERTINENT HISTORY: Breast cancer 2018; chronic L hip pain PAIN:  Are you having pain? Yes: NPRS scale: 4/10 Pain location: L hip  Pain description: constant ache Aggravating factors: sitting; moving sit to stand   Relieving factors: meds; ice; avoiding activities   PRECAUTIONS: Anterior hip  WEIGHT BEARING RESTRICTIONS: No  FALLS:  Has patient fallen in last 6 months? 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  Walking; gym 4 days/wk; sewing; church   PATIENT GOALS: get rid of the cane walking; return to normal activities including gym programs  NEXT MD VISIT: 02/12/23  OBJECTIVE:   DIAGNOSTIC FINDINGS: xray 10/23/22 Spurring of the femoral head consistent with advanced left hip  degenerative joint disease  PATIENT SURVEYS:  FOTO 19; goal 53   SENSATION: Numbness L lateral hip area   EDEMA:  Mild in L lateral hip   MUSCLE LENGTH: Hamstrings: Right 75 deg; Left 65 deg Thomas test - modified: Left 0 deg  POSTURE: rounded shoulders, forward head, flexed trunk , and weight shift right  PALPATION: Tightness anterior L thigh through  quads/hip flexors   LOWER EXTREMITY ROM:  Active Assistive ROM Right eval Left eval  Hip flexion WNL 90  Hip extension WNL 0  Hip abduction WNL Limited   Hip adduction    Hip internal rotation    Hip external rotation    Knee flexion WNL WNL  Knee extension WNL WNL  Ankle dorsiflexion    Ankle plantarflexion    Ankle inversion    Ankle eversion     (Blank rows = not tested)  LOWER EXTREMITY MMT: L LE not tested resistively - lifting L LE well against gravity   MMT Right eval Left eval  Hip flexion 5   Hip extension 5   Hip abduction 5   Hip adduction    Hip internal rotation    Hip external rotation    Knee flexion    Knee extension    Ankle dorsiflexion    Ankle plantarflexion    Ankle  inversion    Ankle eversion     (Blank rows = not tested)   GAIT: Distance walked: 40 feet Assistive device utilized: Single point cane Level of assistance: Complete Independence Comments: slowed gait;  good gait pattern    [facility] Adult PT Treatment:                                                DATE: 01/24/23 Therapeutic Exercise: Supine  Quad set - from HEP  SLR partial range -  from HEP  SAQ -  from HEP  Hip flexor stretch L LE off edge of table PT guiding LE and pt using stretch out strap 20 sec x 2  Sitting  Anterior posterior pelvic tilt  2-3 sec x 5  Standing Weight shift side to side Weight shift stagger stance frd/back  Wall squat 10 sec x 5  Hip extension L 2-3 sec x 10  Myofacial ball release L posterior hip in standing  Manual Therapy: Myofacial work anterior L thigh  STM L quads, lateral thigh Neuromuscular re-ed: Standing weight shifts  Gait: SPC step through gait pattern  Modalities: Has TENS unit at home  [facility] Adult PT Treatment:                                                DATE: 01/22/23 Therapeutic Exercise: Supine  Quad set - from HEP  SLR partial range -  from HEP  SAQ -  from HEP  Hip flexor stretch L LE off edge of table PT guiding LE  and pt using stretch out strap 20 sec x 2  Sitting  Anterior posterior pelvic tilt  2-3 sec x 5  Standing Weight shift side to side Weight shift stagger stance frd/back  Wall squat 10 sec x 5  Manual Therapy: Myofacial work anterior L thigh  Neuromuscular re-ed: Standing weight shifts  Gait: SPC step through gait pattern  Modalities: Has TENS unit at home    PATIENT EDUCATION:  Education details: POC; HEP Person educated: Patient Education method: Programmer, multimedia, Facilities manager, Actor cues, Verbal cues, and Handouts Education comprehension: verbalized understanding, returned demonstration, verbal cues required, tactile cues required, and needs further education  HOME EXERCISE PROGRAM: Access Code: [email or web address] URL: [email or web address] Date: 01/22/2023 Prepared by: Corlis Leak  Exercises - Supine Transversus Abdominis Bracing with Pelvic Floor Contraction  - 2 x daily - 7 x weekly - 1 sets - 10 reps - 10sec  hold - Seated Anterior Pelvic Tilt  - 2 x daily - 7 x weekly - 1 sets - 10 reps - 2-3 sec  hold - Hip Flexor Stretch at Edge of Bed  - 2 x daily - 7 x weekly - 1 sets - 3 reps - 30 sec  hold - Wall Quarter Squat  - 2 x daily - 7 x weekly - 1-2 sets - 10 reps - 5-10 sec  hold  ASSESSMENT:  CLINICAL IMPRESSION: Patient consistent with HEP. Reviewed and progressed exercises. Added myofacial work L anterior hip/quads avoiding incision. Added myofacial bal release posterior hip in standing. Good response to treatment with improved gait pattern following treatment.   Eval: Patient is a 67 y.o. female who was seen today for physical therapy evaluation  and treatment s/p L THA 12/31/22. She presents with decreased L LE ROM, strength, function; abnormal gait pattern; limited functional activities; inability to participate in recreational activities and community activities. Patient will benefit from PT to address problems identified.   OBJECTIVE IMPAIRMENTS: Abnormal gait,  decreased activity tolerance, decreased balance, decreased mobility, difficulty walking, decreased ROM, decreased strength, hypomobility, increased fascial restrictions, increased muscle spasms, impaired flexibility, improper body mechanics, postural dysfunction, and pain.    GOALS: Goals reviewed with patient? Yes  SHORT TERM GOALS: Target date: 03/05/2023   Independent in initial HEP  Baseline: Goal status: INITIAL  2.  Independent gait without assistive device in home and short distance level surfaces ~ 100 ft  Baseline:  Goal status: INITIAL  LONG TERM GOALS: Target date: 04/16/2023   AROM L hip WFL's and pain free Baseline:  Goal status: INITIAL  2.  5/5 strength L LE  Baseline:  Goal status: INITIAL  3.  Normal gait pattern with no assistive device for community distances ~ 400-500 ft  Baseline:  Goal status: INITIAL  4.  Independent in HEP including aquatic exercise program  Baseline:  Goal status: INITIAL  5.  Improve functional limitation score to 53 Baseline:  Goal status: INITIAL  6.  Return to community based recreational activities including gym program and walking  Baseline:  Goal status: INITIAL   PLAN:  PT FREQUENCY: 2x/week  PT DURATION: 12 weeks  PLANNED INTERVENTIONS: Therapeutic exercises, Therapeutic activity, Neuromuscular re-education, Balance training, Gait training, Patient/Family education, Self Care, Joint mobilization, Stair training, DME instructions, Aquatic Therapy, Dry Needling, Electrical stimulation, Spinal mobilization, Cryotherapy, Moist heat, Taping, Ultrasound, Ionotophoresis 4mg /ml Dexamethasone, Manual therapy, and Re-evaluation  PLAN FOR NEXT SESSION: review and progress exercise; continue eduction re-post op precautions following THA; manual work, DN, modalities as indicated    [employer], PT 01/24/2023, 2:44 PM

## 2023-01-25 ENCOUNTER — Other Ambulatory Visit: Payer: Self-pay

## 2023-01-25 ENCOUNTER — Encounter: Payer: Self-pay | Admitting: Orthopaedic Surgery

## 2023-01-25 MED ORDER — AMOXICILLIN 500 MG PO TABS: ORAL_TABLET | ORAL | 2 refills | Status: DC

## 2023-01-30 ENCOUNTER — Ambulatory Visit: Payer: Medicare Other | Admitting: Rehabilitative and Restorative Service Providers"

## 2023-01-30 ENCOUNTER — Encounter: Payer: Self-pay | Admitting: Rehabilitative and Restorative Service Providers"

## 2023-01-30 DIAGNOSIS — M25652 Stiffness of left hip, not elsewhere classified: Secondary | ICD-10-CM

## 2023-01-30 DIAGNOSIS — R29898 Other symptoms and signs involving the musculoskeletal system: Secondary | ICD-10-CM

## 2023-01-30 DIAGNOSIS — R2689 Other abnormalities of gait and mobility: Secondary | ICD-10-CM

## 2023-01-30 DIAGNOSIS — M6281 Muscle weakness (generalized): Secondary | ICD-10-CM

## 2023-01-30 DIAGNOSIS — M25552 Pain in left hip: Secondary | ICD-10-CM

## 2023-01-30 DIAGNOSIS — Z471 Aftercare following joint replacement surgery: Secondary | ICD-10-CM | POA: Diagnosis not present

## 2023-01-30 NOTE — Therapy (Signed)
OUTPATIENT PHYSICAL THERAPY LOWER EXTREMITY TREATMENT    Patient Name: Jamie [name] MRN: [id number] DOB:1955/10/05, 67 y.o., female Today's Date: 01/30/2023  END OF SESSION:  PT End of Session - 01/30/23 1143     Visit Number 3    Number of Visits 24    Date for PT Re-Evaluation 04/16/23    Authorization Type medicare and [employer]    Progress Note Due on Visit 10    PT Start Time 1142    PT Stop Time 1230    PT Time Calculation (min) 48 min    Activity Tolerance Patient tolerated treatment well             Past Medical History:  Diagnosis Date   Allergy    Anemia    Hx   Anxiety    Occasionally   Arthritis    feet   Basal cell carcinoma    Breast cancer (HCC) 06/2016   left breast   Colon polyps    Depression    Eczema    Genetic testing 08/15/2016   Ms. Tauer underwent genetic testing for hereditary cancer syndrome through [facility]'s 43-gene Common Hereditary Cancers Panel. Ms. 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 Result report is dated 08/15/2016. Please see genetic counseling documentation from 08/17/2016 for further discussion.   History of radiation therapy 01/02/17-01/31/17   left breast was treated to 42.72 Gy in 16 fractions, left breast boost 10 Gy in 5 fractions   Hyperlipidemia    Personal history of chemotherapy    Personal history of radiation therapy    Pneumonia    2000s   PONV (postoperative nausea and vomiting)    Past Surgical History:  Procedure Laterality Date   BONE SPUR Bilateral 2001 AND 1988   BREAST BIOPSY     BREAST LUMPECTOMY Left    07/2016   BREAST LUMPECTOMY WITH RADIOACTIVE SEED AND SENTINEL LYMPH NODE BIOPSY Left 07/26/2016   Procedure: BREAST LUMPECTOMY WITH RADIOACTIVE SEED AND SENTINEL LYMPH NODE BIOPSY;  Surgeon: Almond Lint, MD;  Location: [facility];  Service: General;  Laterality: Left;   BUNIONECTOMY Bilateral 02/2012    COLONOSCOPY W/ POLYPECTOMY  08/2007   DILATION AND CURETTAGE OF UTERUS  2002   MOHS SURGERY  2010   PORT-A-CATH REMOVAL N/A 07/18/2017   Procedure: REMOVAL PORT-A-CATH;  Surgeon: Almond Lint, MD;  Location: [facility];  Service: General;  Laterality: N/A;   PORTACATH PLACEMENT Right 07/26/2016   Procedure: INSERTION PORT-A-CATH;  Surgeon: Almond Lint, MD;  Location: [facility];  Service: General;  Laterality: Right;   TOTAL HIP ARTHROPLASTY Left 12/31/2022   Procedure: LEFT TOTAL HIP REPLACEMENT;  Surgeon: Tarry Kos, MD;  Location: [facility] OR;  Service: Orthopedics;  Laterality: Left;  3-C   Patient Active Problem List   Diagnosis Date Noted   Status post total replacement of left hip 12/31/2022   Prediabetes 11/13/2022   Chronic left hip pain 05/08/2022   Family history of coronary arteriosclerosis 10/13/2021   Primary osteoarthritis of left hip 03/23/2021   Family history of osteoporosis 06/12/2018   History of breast cancer 06/12/2018   Advance directive discussed with patient 04/03/2018   Pap smear of cervix declined 04/03/2018   Lumbago 08/16/2017   Basal cell carcinoma 07/17/2017   Vaginismus 07/17/2017   Abnormal TSH 03/28/2017   Diarrhea 11/05/2016   Port catheter in place 10/23/2016   Genetic testing 08/15/2016  Malignant neoplasm of upper-outer quadrant of left breast in female, estrogen receptor positive (HCC) 07/10/2016   Allergic rhinitis 06/13/2016   History of basal cell carcinoma 06/13/2016   Eczema 06/13/2016   Hyperlipemia 06/13/2016   History of colon polyps 06/13/2016   Plantar fascial fibromatosis 06/13/2016   RLS (restless legs syndrome) 06/13/2016   Subclinical hypothyroidism 07/20/2013   Bunion 01/21/2013   Osteopenia 01/20/2013    PCP: Dr Dewayne Hatch [place] [facility]  REFERRING PROVIDER: Dr Tarry Kos  REFERRING DIAG: s/p L THA  THERAPY DIAG:  Pain in left hip  Muscle weakness (generalized)  Stiffness of left hip, not elsewhere  classified  Other symptoms and signs involving the musculoskeletal system  Other abnormalities of gait and mobility  Rationale for Evaluation and Treatment: Rehabilitation  ONSET DATE: Surgery:  12/31/22  SUBJECTIVE:   SUBJECTIVE STATEMENT: Can tell her hip and leg are feeling better. She has less soreness and pain today. Sleeping better. Using the TENS at home. Has not taken the OxyContin n more than a week.    Eval: L hip pain for a few years with gradual progression of symptoms. She has significant increase in symptoms in the past 1.5 years. MRI showed degenerative changes L hip. Underwent L THA anterior approach 12/31/22 with no complications.   PERTINENT HISTORY: Breast cancer 2018; chronic L hip pain PAIN:  Are you having pain? Yes: NPRS scale: 2-3/10 Pain location: L hip  Pain description: constant ache Aggravating factors: sitting; moving sit to stand   Relieving factors: meds; ice; avoiding activities   PRECAUTIONS: Anterior hip  WEIGHT BEARING RESTRICTIONS: No  FALLS:  Has patient fallen in last 6 months? 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  Walking; gym 4 days/wk; sewing; church   PATIENT GOALS: get rid of the cane walking; return to normal activities including gym programs  NEXT MD VISIT: 02/12/23  OBJECTIVE:   DIAGNOSTIC FINDINGS: xray 10/23/22 Spurring of the femoral head consistent with advanced left hip  degenerative joint disease  PATIENT SURVEYS:  FOTO 19; goal 53   SENSATION: Numbness L lateral hip area   EDEMA:  Mild in L lateral hip   MUSCLE LENGTH: Hamstrings: Right 75 deg; Left 65 deg Thomas test - modified: Left 0  deg  POSTURE: rounded shoulders, forward head, flexed trunk , and weight shift right  PALPATION: Tightness anterior L thigh through quads/hip flexors   LOWER EXTREMITY ROM:  Active Assistive ROM Right eval Left eval  Hip flexion WNL 90  Hip extension WNL 0  Hip abduction WNL Limited   Hip adduction    Hip internal rotation    Hip external rotation    Knee flexion WNL WNL  Knee extension WNL WNL  Ankle dorsiflexion    Ankle plantarflexion    Ankle inversion    Ankle eversion     (Blank rows = not tested)  LOWER EXTREMITY MMT: L LE not tested resistively - lifting L LE well against gravity   MMT Right eval Left eval  Hip flexion 5   Hip extension 5   Hip abduction 5   Hip adduction    Hip internal rotation    Hip external rotation  Knee flexion    Knee extension    Ankle dorsiflexion    Ankle plantarflexion    Ankle inversion    Ankle eversion     (Blank rows = not tested)   GAIT: Distance walked: 40 feet Assistive device utilized: Single point cane Level of assistance: Complete Independence Comments: slowed gait;  good gait pattern    [facility] Adult PT Treatment:                                                DATE: 01/30/23 Therapeutic Exercise: Nustep L5 x 7  Supine  Hamstring stretch 30 sec x 2  Quad set 5 sec x 10  SLR small range 3 sec x 10  SAQ on bolster 3 sec x 10   Hip flexor stretch L LE off edge of table PT guiding LE and pt using stretch out strap 20 sec x 2  Sidelying Hip abduction leading with heel 3 sec x 10 x 2 Sitting  Anterior posterior pelvic tilt  2-3 sec x 5  Hamstring stretch with hinged hip 30 sec x 2 R/L Prone  Glut set 10 sec x 10 Hip extension 3 sec x 10 L Standing Weight shift side to side Weight shift stagger stance frd/back  Wall squat 10 sec x 5  Hip extension L 2-3 sec x 10  Hip abduction in doorway leading with heel 3 sec x 10 R/L   Manual Therapy: Myofacial work with massage stick in sitting  Neuromuscular  re-ed: Standing weight shifts  Gait: SPC step through gait pattern 80 feet  Ambulating without cane 40 feet x 3  Backward walking with cane 40 feet x 3 Modalities: Has TENS unit at home   [facility] Adult PT Treatment:                                                DATE: 01/24/23 Therapeutic Exercise: Supine  Quad set - from HEP  SLR partial range -  from HEP  SAQ -  from HEP  Hip flexor stretch L LE off edge of table PT guiding LE and pt using stretch out strap 20 sec x 2  Sitting  Anterior posterior pelvic tilt  2-3 sec x 5  Standing Weight shift side to side Weight shift stagger stance frd/back  Wall squat 10 sec x 5  Hip extension L 2-3 sec x 10  Myofacial ball release L posterior hip in standing  Manual Therapy: Myofacial work anterior L thigh  STM L quads, lateral thigh Neuromuscular re-ed: Standing weight shifts  Gait: SPC step through gait pattern  Modalities: Has TENS unit at home   PATIENT EDUCATION:  Education details: POC; HEP Person educated: Patient Education method: Programmer, multimedia, Facilities manager, Actor cues, Verbal cues, and Handouts Education comprehension: verbalized understanding, returned demonstration, verbal cues required, tactile cues required, and needs further education  HOME EXERCISE PROGRAM:  Access Code: [email or web address] URL: [email or web address] Date: 01/30/2023 Prepared by: Corlis Leak  Exercises - Supine Transversus Abdominis Bracing with Pelvic Floor Contraction  - 2 x daily - 7 x weekly - 1 sets - 10 reps - 10sec  hold - Seated Anterior Pelvic Tilt  - 2 x daily - 7 x weekly - 1  sets - 10 reps - 2-3 sec  hold - Hip Flexor Stretch at Edge of Bed  - 2 x daily - 7 x weekly - 1 sets - 3 reps - 30 sec  hold - Wall Quarter Squat  - 2 x daily - 7 x weekly - 1-2 sets - 10 reps - 5-10 sec  hold - Seated Hamstring Stretch  - 2 x daily - 7 x weekly - 1 sets - 3 reps - 30 sec  hold - Hooklying Hamstring Stretch with Strap  - 1 x daily - 7 x  weekly - 1 sets - 3 reps - 30 sec  hold - Gastroc Stretch on Wall  - 1 x daily - 7 x weekly - 1 sets - 3 reps - 30 sec  hold - Soleus Stretch on Wall  - 1 x daily - 7 x weekly - 1 sets - 3 reps - 30 sec  hold - Supine Quad Set  - 1 x daily - 7 x weekly - 1 sets - 10 reps - 3 sec  hold - Small Range Straight Leg Raise  - 1 x daily - 7 x weekly - 1 sets - 10 reps - 5 sec  hold - Supine Knee Extension Strengthening  - 1 x daily - 7 x weekly - 1-2 sets - 10 reps - 5 sec  hold - Sidelying Hip Abduction  - 1 x daily - 7 x weekly - 3 sets - 10 reps - 3-5 sec  hold - Prone Gluteal Sets  - 1 x daily - 7 x weekly - 1 sets - 10 reps - 10 sec  hold - Prone Hip Extension  - 1 x daily - 7 x weekly - 1 sets - 10 reps - 3 sec  hold  ASSESSMENT:  CLINICAL IMPRESSION: Patient reports good decrease in pain and increased ability to sit, stand and walk. She continues to be consistent with HEP. Reviewed and progressed exercises. Working on gait with and without cane. Added backward walking for home. Good response to treatment with improved gait pattern noted.   Eval: Patient is a 67 y.o. female who was seen today for physical therapy evaluation and treatment s/p L THA 12/31/22. She presents with decreased L LE ROM, strength, function; abnormal gait pattern; limited functional activities; inability to participate in recreational activities and community activities. Patient will benefit from PT to address problems identified.   OBJECTIVE IMPAIRMENTS: Abnormal gait, decreased activity tolerance, decreased balance, decreased mobility, difficulty walking, decreased ROM, decreased strength, hypomobility, increased fascial restrictions, increased muscle spasms, impaired flexibility, improper body mechanics, postural dysfunction, and pain.    GOALS: Goals reviewed with patient? Yes  SHORT TERM GOALS: Target date: 03/05/2023   Independent in initial HEP  Baseline: Goal status: INITIAL  2.  Independent gait without  assistive device in home and short distance level surfaces ~ 100 ft  Baseline:  Goal status: INITIAL  LONG TERM GOALS: Target date: 04/16/2023   AROM L hip WFL's and pain free Baseline:  Goal status: INITIAL  2.  5/5 strength L LE  Baseline:  Goal status: INITIAL  3.  Normal gait pattern with no assistive device for community distances ~ 400-500 ft  Baseline:  Goal status: INITIAL  4.  Independent in HEP including aquatic exercise program  Baseline:  Goal status: INITIAL  5.  Improve functional limitation score to 53 Baseline:  Goal status: INITIAL  6.  Return to community based recreational activities including gym  program and walking  Baseline:  Goal status: INITIAL   PLAN:  PT FREQUENCY: 2x/week  PT DURATION: 12 weeks  PLANNED INTERVENTIONS: Therapeutic exercises, Therapeutic activity, Neuromuscular re-education, Balance training, Gait training, Patient/Family education, Self Care, Joint mobilization, Stair training, DME instructions, Aquatic Therapy, Dry Needling, Electrical stimulation, Spinal mobilization, Cryotherapy, Moist heat, Taping, Ultrasound, Ionotophoresis 4mg /ml Dexamethasone, Manual therapy, and Re-evaluation  PLAN FOR NEXT SESSION: review and progress exercise; continue eduction re-post op precautions following THA; manual work, DN, modalities as indicated    [employer], PT 01/30/2023, 11:43 AM

## 2023-02-01 ENCOUNTER — Ambulatory Visit: Payer: Medicare Other | Admitting: Physical Therapy

## 2023-02-05 ENCOUNTER — Encounter: Payer: Self-pay | Admitting: Rehabilitative and Restorative Service Providers"

## 2023-02-05 ENCOUNTER — Ambulatory Visit: Payer: Medicare Other | Admitting: Rehabilitative and Restorative Service Providers"

## 2023-02-05 DIAGNOSIS — M6281 Muscle weakness (generalized): Secondary | ICD-10-CM

## 2023-02-05 DIAGNOSIS — Z471 Aftercare following joint replacement surgery: Secondary | ICD-10-CM | POA: Diagnosis not present

## 2023-02-05 DIAGNOSIS — R29898 Other symptoms and signs involving the musculoskeletal system: Secondary | ICD-10-CM

## 2023-02-05 DIAGNOSIS — M25652 Stiffness of left hip, not elsewhere classified: Secondary | ICD-10-CM

## 2023-02-05 DIAGNOSIS — R2689 Other abnormalities of gait and mobility: Secondary | ICD-10-CM

## 2023-02-05 DIAGNOSIS — M25552 Pain in left hip: Secondary | ICD-10-CM

## 2023-02-05 NOTE — Therapy (Signed)
OUTPATIENT PHYSICAL THERAPY LOWER EXTREMITY TREATMENT    Patient Name: Jamie [name] MRN: [id number] DOB:1955-10-14, 67 y.o., female Today's Date: 02/05/2023  END OF SESSION:  PT End of Session - 02/05/23 1406     Visit Number 4    Number of Visits 24    Date for PT Re-Evaluation 04/16/23    Authorization Type medicare and [employer]    Progress Note Due on Visit 10    PT Start Time 1400    PT Stop Time 1445    PT Time Calculation (min) 45 min    Activity Tolerance Patient tolerated treatment well             Past Medical History:  Diagnosis Date   Allergy    Anemia    Hx   Anxiety    Occasionally   Arthritis    feet   Basal cell carcinoma    Breast cancer (HCC) 06/2016   left breast   Colon polyps    Depression    Eczema    Genetic testing 08/15/2016   Ms. Cipollone underwent genetic testing for hereditary cancer syndrome through [employer]'s 43-gene Common Hereditary Cancers Panel. Ms. 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 Result report is dated 08/15/2016. Please see genetic counseling documentation from 08/17/2016 for further discussion.   History of radiation therapy 01/02/17-01/31/17   left breast was treated to 42.72 Gy in 16 fractions, left breast boost 10 Gy in 5 fractions   Hyperlipidemia    Personal history of chemotherapy    Personal history of radiation therapy    Pneumonia    2000s   PONV (postoperative nausea and vomiting)    Past Surgical History:  Procedure Laterality Date   BONE SPUR Bilateral 2001 AND 1988   BREAST BIOPSY     BREAST LUMPECTOMY Left    07/2016   BREAST LUMPECTOMY WITH RADIOACTIVE SEED AND SENTINEL LYMPH NODE BIOPSY Left 07/26/2016   Procedure: BREAST LUMPECTOMY WITH RADIOACTIVE SEED AND SENTINEL LYMPH NODE BIOPSY;  Surgeon: Almond Lint, MD;  Location: [facility];  Service: General;  Laterality: Left;   BUNIONECTOMY Bilateral 02/2012    COLONOSCOPY W/ POLYPECTOMY  08/2007   DILATION AND CURETTAGE OF UTERUS  2002   MOHS SURGERY  2010   PORT-A-CATH REMOVAL N/A 07/18/2017   Procedure: REMOVAL PORT-A-CATH;  Surgeon: Almond Lint, MD;  Location: [facility];  Service: General;  Laterality: N/A;   PORTACATH PLACEMENT Right 07/26/2016   Procedure: INSERTION PORT-A-CATH;  Surgeon: Almond Lint, MD;  Location: [facility];  Service: General;  Laterality: Right;   TOTAL HIP ARTHROPLASTY Left 12/31/2022   Procedure: LEFT TOTAL HIP REPLACEMENT;  Surgeon: Tarry Kos, MD;  Location: [facility];  Service: Orthopedics;  Laterality: Left;  3-C   Patient Active Problem List   Diagnosis Date Noted   Status post total replacement of left hip 12/31/2022   Prediabetes 11/13/2022   Chronic left hip pain 05/08/2022   Family history of coronary arteriosclerosis 10/13/2021   Primary osteoarthritis of left hip 03/23/2021   Family history of osteoporosis 06/12/2018   History of breast cancer 06/12/2018   Advance directive discussed with patient 04/03/2018   Pap smear of cervix declined 04/03/2018   Lumbago 08/16/2017   Basal cell carcinoma 07/17/2017   Vaginismus 07/17/2017   Abnormal TSH 03/28/2017   Diarrhea 11/05/2016   Port catheter in place 10/23/2016   Genetic testing 08/15/2016  Malignant neoplasm of upper-outer quadrant of left breast in female, estrogen receptor positive (HCC) 07/10/2016   Allergic rhinitis 06/13/2016   History of basal cell carcinoma 06/13/2016   Eczema 06/13/2016   Hyperlipemia 06/13/2016   History of colon polyps 06/13/2016   Plantar fascial fibromatosis 06/13/2016   RLS (restless legs syndrome) 06/13/2016   Subclinical hypothyroidism 07/20/2013   Bunion 01/21/2013   Osteopenia 01/20/2013    PCP: Dr Dewayne Hatch [place]  REFERRING PROVIDER: Dr Tarry Kos  REFERRING DIAG: s/p L THA  THERAPY DIAG:  Pain in left hip  Muscle weakness (generalized)  Stiffness of left hip, not elsewhere  classified  Other symptoms and signs involving the musculoskeletal system  Other abnormalities of gait and mobility  Rationale for Evaluation and Treatment: Rehabilitation  ONSET DATE: Surgery:  12/31/22  SUBJECTIVE:   SUBJECTIVE STATEMENT: Hip and leg continue to improve. She has less soreness and pain and she is sleeping better. Using the TENS at home as needed.     Eval: L hip pain for a few years with gradual progression of symptoms. She has significant increase in symptoms in the past 1.5 years. MRI showed degenerative changes L hip. Underwent L THA anterior approach 12/31/22 with no complications.   PERTINENT HISTORY: Breast cancer 2018; chronic L hip pain PAIN:  Are you having pain? Yes: NPRS scale: 2/10 Pain location: L hip  Pain description: constant ache Aggravating factors: sitting; moving sit to stand   Relieving factors: meds; ice; avoiding activities   PRECAUTIONS: Anterior hip  WEIGHT BEARING RESTRICTIONS: No  FALLS:  Has patient fallen in last 6 months? 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  Walking; gym 4 days/wk; sewing; church   PATIENT GOALS: get rid of the cane walking; return to normal activities including gym programs  NEXT MD VISIT: 02/12/23  OBJECTIVE:   DIAGNOSTIC FINDINGS: xray 10/23/22 Spurring of the femoral head consistent with advanced left hip  degenerative joint disease  PATIENT SURVEYS:  FOTO 19; goal 53   SENSATION: Numbness L lateral hip area   EDEMA:  Mild in L lateral hip   MUSCLE LENGTH: Hamstrings: Right 75 deg; Left 65 deg Thomas test - modified: Left 0 deg  POSTURE: rounded shoulders, forward head,  flexed trunk , and weight shift right  PALPATION: Tightness anterior L thigh through quads/hip flexors   LOWER EXTREMITY ROM:  Active Assistive ROM Right eval Left eval  Hip flexion WNL 90  Hip extension WNL 0  Hip abduction WNL Limited   Hip adduction    Hip internal rotation    Hip external rotation    Knee flexion WNL WNL  Knee extension WNL WNL  Ankle dorsiflexion    Ankle plantarflexion    Ankle inversion    Ankle eversion     (Blank rows = not tested)  LOWER EXTREMITY MMT: L LE not tested resistively - lifting L LE well against gravity   MMT Right eval Left eval  Hip flexion 5   Hip extension 5   Hip abduction 5   Hip adduction    Hip internal rotation    Hip external rotation    Knee flexion    Knee extension  Ankle dorsiflexion    Ankle plantarflexion    Ankle inversion    Ankle eversion     (Blank rows = not tested)   GAIT: Distance walked: 40 feet Assistive device utilized: Single point cane Level of assistance: Complete Independence Comments: slowed gait;  good gait pattern    [facility] Adult PT Treatment:                                                DATE: 02/05/23 Therapeutic Exercise: Nustep L7 x 8  Gait Ambulating forward and back Ambulating side to side R/L  Supine  Hip abduction green 3 sec alternating LE's x 10 x 2  Bridging with green TB above knees 3 sec x 10 x 2 Hamstring stretch 30 sec x 2  Hip flexor stretch L LE off edge of table PT guiding LE and pt using stretch out strap 20 sec x 2  Sidelying Clamshell green TB 3 sec x 10 L  Hip abduction leading with heel 3 sec x 10 x 2 Sitting  Anterior posterior pelvic tilt  2-3 sec x 5  Hamstring stretch with hinged hip 30 sec x 2 R/L Prone  Glut set 10 sec x 10 Hip extension 3 sec x 10 x 2  L Quad stretch 30 sec x 3 Standing Weight shift side to side Weight shift stagger stance frd/back  Wall squat 10 sec x 5  Hip extension L 2-3 sec x 10  Hip abduction in doorway leading with  heel 3 sec x 10 R/L   Manual Therapy: Myofacial work with massage stick in sitting  Neuromuscular re-ed: Standing weight shifts  Gait: SPC step through gait pattern 80 feet  Ambulating without cane 40 feet x 3  Backward walking with cane 40 feet x 3 Modalities: Has TENS unit at home   [facility] Adult PT Treatment:                                                DATE: 01/30/23 Therapeutic Exercise: Nustep L5 x 7  Supine  Hamstring stretch 30 sec x 2  Quad set 5 sec x 10  SLR small range 3 sec x 10  SAQ on bolster 3 sec x 10   Hip flexor stretch L LE off edge of table PT guiding LE and pt using stretch out strap 20 sec x 2  Sidelying Hip abduction leading with heel 3 sec x 10 x 2 Sitting  Anterior posterior pelvic tilt  2-3 sec x 5  Hamstring stretch with hinged hip 30 sec x 2 R/L Prone  Glut set 10 sec x 10 Hip extension 3 sec x 10 L Standing Weight shift side to side Weight shift stagger stance frd/back  Wall squat 10 sec x 5  Hip extension L 2-3 sec x 10  Hip abduction in doorway leading with heel 3 sec x 10 R/L   Manual Therapy: Myofacial work with massage stick in sitting  Neuromuscular re-ed: Standing weight shifts  Gait: SPC step through gait pattern 80 feet  Ambulating without cane 40 feet x 3  Backward walking with cane 40 feet x 3 Modalities: Has TENS unit at home   PATIENT EDUCATION:  Education details: POC;  HEP Person educated: Patient Education method: Explanation, Demonstration, Tactile cues, Verbal cues, and Handouts Education comprehension: verbalized understanding, returned demonstration, verbal cues required, tactile cues required, and needs further education  HOME EXERCISE PROGRAM:  Access Code: [email or web address] URL: [email or web address] Date: 02/05/2023 Prepared by: Corlis Leak  Exercises - Supine Transversus Abdominis Bracing with Pelvic Floor Contraction  - 2 x daily - 7 x weekly - 1 sets - 10 reps - 10sec  hold - Seated Anterior  Pelvic Tilt  - 2 x daily - 7 x weekly - 1 sets - 10 reps - 2-3 sec  hold - Hip Flexor Stretch at Edge of Bed  - 2 x daily - 7 x weekly - 1 sets - 3 reps - 30 sec  hold - Wall Quarter Squat  - 2 x daily - 7 x weekly - 1-2 sets - 10 reps - 5-10 sec  hold - Seated Hamstring Stretch  - 2 x daily - 7 x weekly - 1 sets - 3 reps - 30 sec  hold - Hooklying Hamstring Stretch with Strap  - 1 x daily - 7 x weekly - 1 sets - 3 reps - 30 sec  hold - Gastroc Stretch on Wall  - 1 x daily - 7 x weekly - 1 sets - 3 reps - 30 sec  hold - Soleus Stretch on Wall  - 1 x daily - 7 x weekly - 1 sets - 3 reps - 30 sec  hold - Supine Quad Set  - 1 x daily - 7 x weekly - 1 sets - 10 reps - 3 sec  hold - Small Range Straight Leg Raise  - 1 x daily - 7 x weekly - 1 sets - 10 reps - 5 sec  hold - Supine Knee Extension Strengthening  - 1 x daily - 7 x weekly - 1-2 sets - 10 reps - 5 sec  hold - Sidelying Hip Abduction  - 1 x daily - 7 x weekly - 3 sets - 10 reps - 3-5 sec  hold - Prone Gluteal Sets  - 1 x daily - 7 x weekly - 1 sets - 10 reps - 10 sec  hold - Prone Hip Extension  - 1 x daily - 7 x weekly - 1 sets - 10 reps - 3 sec  hold - Prone Quadriceps Stretch with Strap  - 2 x daily - 7 x weekly - 1 sets - 3 reps - 30 sec  hold - Hooklying Isometric Clamshell  - 2 x daily - 7 x weekly - 1 sets - 10 reps - 3 sec  hold - Bridge with Hip Abduction and Resistance  - 2 x daily - 7 x weekly - 1-2 sets - 10 reps - 3 sec  hold - Side Stepping with Resistance at Thighs  - 1 x daily - 7 x weekly  ASSESSMENT:  CLINICAL IMPRESSION: Patient reports continued progress with decrease in pain and increased ability to sit, stand and walk. She is no longer using the cane in the house. She continues to be consistent with HEP. Reviewed and progressed exercises. Working on gait without cane. Good response to treatment with improved gait pattern and increased strength with resistive exercises.   Eval: Patient is a 67 y.o. female who was seen  today for physical therapy evaluation and treatment s/p L THA 12/31/22. She presents with decreased L LE ROM, strength, function; abnormal gait pattern; limited functional activities; inability to participate in recreational activities  and community activities. Patient will benefit from PT to address problems identified.   OBJECTIVE IMPAIRMENTS: Abnormal gait, decreased activity tolerance, decreased balance, decreased mobility, difficulty walking, decreased ROM, decreased strength, hypomobility, increased fascial restrictions, increased muscle spasms, impaired flexibility, improper body mechanics, postural dysfunction, and pain.    GOALS: Goals reviewed with patient? Yes  SHORT TERM GOALS: Target date: 03/05/2023   Independent in initial HEP  Baseline: Goal status: INITIAL  2.  Independent gait without assistive device in home and short distance level surfaces ~ 100 ft  Baseline:  Goal status: INITIAL  LONG TERM GOALS: Target date: 04/16/2023   AROM L hip WFL's and pain free Baseline:  Goal status: INITIAL  2.  5/5 strength L LE  Baseline:  Goal status: INITIAL  3.  Normal gait pattern with no assistive device for community distances ~ 400-500 ft  Baseline:  Goal status: INITIAL  4.  Independent in HEP including aquatic exercise program  Baseline:  Goal status: INITIAL  5.  Improve functional limitation score to 53 Baseline:  Goal status: INITIAL  6.  Return to community based recreational activities including gym program and walking  Baseline:  Goal status: INITIAL   PLAN:  PT FREQUENCY: 2x/week  PT DURATION: 12 weeks  PLANNED INTERVENTIONS: Therapeutic exercises, Therapeutic activity, Neuromuscular re-education, Balance training, Gait training, Patient/Family education, Self Care, Joint mobilization, Stair training, DME instructions, Aquatic Therapy, Dry Needling, Electrical stimulation, Spinal mobilization, Cryotherapy, Moist heat, Taping, Ultrasound,  Ionotophoresis 4mg /ml Dexamethasone, Manual therapy, and Re-evaluation  PLAN FOR NEXT SESSION: review and progress exercise; continue eduction re-post op precautions following THA; manual work, DN, modalities as indicated    [employer], PT 02/05/2023, 2:07 PM

## 2023-02-07 ENCOUNTER — Ambulatory Visit: Payer: Medicare Other | Admitting: Rehabilitative and Restorative Service Providers"

## 2023-02-07 ENCOUNTER — Encounter: Payer: Self-pay | Admitting: Rehabilitative and Restorative Service Providers"

## 2023-02-07 DIAGNOSIS — M25652 Stiffness of left hip, not elsewhere classified: Secondary | ICD-10-CM

## 2023-02-07 DIAGNOSIS — R29898 Other symptoms and signs involving the musculoskeletal system: Secondary | ICD-10-CM

## 2023-02-07 DIAGNOSIS — R2689 Other abnormalities of gait and mobility: Secondary | ICD-10-CM

## 2023-02-07 DIAGNOSIS — M6281 Muscle weakness (generalized): Secondary | ICD-10-CM

## 2023-02-07 DIAGNOSIS — Z471 Aftercare following joint replacement surgery: Secondary | ICD-10-CM | POA: Diagnosis not present

## 2023-02-07 DIAGNOSIS — M25552 Pain in left hip: Secondary | ICD-10-CM

## 2023-02-07 NOTE — Therapy (Signed)
OUTPATIENT PHYSICAL THERAPY LOWER EXTREMITY TREATMENT    Patient Name: Jamie [name] MRN: [id number] DOB:01-22-1956, 67 y.o., female Today's Date: 02/07/2023  END OF SESSION:  PT End of Session - 02/07/23 1528     Visit Number 5    Number of Visits 24    Date for PT Re-Evaluation 04/16/23    Authorization Type medicare and [employer]    Progress Note Due on Visit 10    PT Start Time 1528    PT Stop Time 1614    PT Time Calculation (min) 46 min    Activity Tolerance Patient tolerated treatment well             Past Medical History:  Diagnosis Date   Allergy    Anemia    Hx   Anxiety    Occasionally   Arthritis    feet   Basal cell carcinoma    Breast cancer (HCC) 06/2016   left breast   Colon polyps    Depression    Eczema    Genetic testing 08/15/2016   Ms. Elamin underwent genetic testing for hereditary cancer syndrome through [facility]'s 43-gene Common Hereditary Cancers Panel. Ms. 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 Result report is dated 08/15/2016. Please see genetic counseling documentation from 08/17/2016 for further discussion.   History of radiation therapy 01/02/17-01/31/17   left breast was treated to 42.72 Gy in 16 fractions, left breast boost 10 Gy in 5 fractions   Hyperlipidemia    Personal history of chemotherapy    Personal history of radiation therapy    Pneumonia    2000s   PONV (postoperative nausea and vomiting)    Past Surgical History:  Procedure Laterality Date   BONE SPUR Bilateral 2001 AND 1988   BREAST BIOPSY     BREAST LUMPECTOMY Left    07/2016   BREAST LUMPECTOMY WITH RADIOACTIVE SEED AND SENTINEL LYMPH NODE BIOPSY Left 07/26/2016   Procedure: BREAST LUMPECTOMY WITH RADIOACTIVE SEED AND SENTINEL LYMPH NODE BIOPSY;  Surgeon: Almond Lint, MD;  Location: [facility];  Service: General;  Laterality: Left;   BUNIONECTOMY Bilateral 02/2012    COLONOSCOPY W/ POLYPECTOMY  08/2007   DILATION AND CURETTAGE OF UTERUS  2002   MOHS SURGERY  2010   PORT-A-CATH REMOVAL N/A 07/18/2017   Procedure: REMOVAL PORT-A-CATH;  Surgeon: Almond Lint, MD;  Location: [facility];  Service: General;  Laterality: N/A;   PORTACATH PLACEMENT Right 07/26/2016   Procedure: INSERTION PORT-A-CATH;  Surgeon: Almond Lint, MD;  Location: [facility];  Service: General;  Laterality: Right;   TOTAL HIP ARTHROPLASTY Left 12/31/2022   Procedure: LEFT TOTAL HIP REPLACEMENT;  Surgeon: Tarry Kos, MD;  Location: [facility];  Service: Orthopedics;  Laterality: Left;  3-C   Patient Active Problem List   Diagnosis Date Noted   Status post total replacement of left hip 12/31/2022   Prediabetes 11/13/2022   Chronic left hip pain 05/08/2022   Family history of coronary arteriosclerosis 10/13/2021   Primary osteoarthritis of left hip 03/23/2021   Family history of osteoporosis 06/12/2018   History of breast cancer 06/12/2018   Advance directive discussed with patient 04/03/2018   Pap smear of cervix declined 04/03/2018   Lumbago 08/16/2017   Basal cell carcinoma 07/17/2017   Vaginismus 07/17/2017   Abnormal TSH 03/28/2017   Diarrhea 11/05/2016   Port catheter in place 10/23/2016   Genetic testing 08/15/2016  Malignant neoplasm of upper-outer quadrant of left breast in female, estrogen receptor positive (HCC) 07/10/2016   Allergic rhinitis 06/13/2016   History of basal cell carcinoma 06/13/2016   Eczema 06/13/2016   Hyperlipemia 06/13/2016   History of colon polyps 06/13/2016   Plantar fascial fibromatosis 06/13/2016   RLS (restless legs syndrome) 06/13/2016   Subclinical hypothyroidism 07/20/2013   Bunion 01/21/2013   Osteopenia 01/20/2013    PCP: Dr Dewayne Hatch [place] [facility]  REFERRING PROVIDER: Dr Tarry Kos  REFERRING DIAG: s/p L THA  THERAPY DIAG:  Pain in left hip  Muscle weakness (generalized)  Stiffness of left hip, not elsewhere  classified  Other symptoms and signs involving the musculoskeletal system  Other abnormalities of gait and mobility  Rationale for Evaluation and Treatment: Rehabilitation  ONSET DATE: Surgery:  12/31/22  SUBJECTIVE:   SUBJECTIVE STATEMENT: Hip and leg continue to improve. Now has more of an ache than pain. Gets really tired at the end of the day. Exercises are going well.    Eval: L hip pain for a few years with gradual progression of symptoms. She has significant increase in symptoms in the past 1.5 years. MRI showed degenerative changes L hip. Underwent L THA anterior approach 12/31/22 with no complications.   PERTINENT HISTORY: Breast cancer 2018; chronic L hip pain PAIN:  Are you having pain? Yes: NPRS scale: 2/10 Pain location: L hip  Pain description: constant ache not really pain  Aggravating factors: sitting; moving sit to stand   Relieving factors: meds; ice; avoiding activities   PRECAUTIONS: Anterior hip  WEIGHT BEARING RESTRICTIONS: No  FALLS:  Has patient fallen in last 6 months? 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  Walking; gym 4 days/wk; sewing; church   PATIENT GOALS: get rid of the cane walking; return to normal activities including gym programs  NEXT MD VISIT: 02/12/23  OBJECTIVE:   DIAGNOSTIC FINDINGS: xray 10/23/22 Spurring of the femoral head consistent with advanced left hip  degenerative joint disease  PATIENT SURVEYS:  FOTO 19; goal 53   SENSATION: Numbness L lateral hip area   EDEMA:  Mild in L lateral hip   MUSCLE LENGTH: Hamstrings: Right 75 deg; Left 65 deg Thomas test - modified: Left 0 deg  POSTURE: rounded  shoulders, forward head, flexed trunk , and weight shift right  PALPATION: Tightness anterior L thigh through quads/hip flexors   LOWER EXTREMITY ROM:  Active Assistive ROM Right eval Left eval  Hip flexion WNL 90  Hip extension WNL 0  Hip abduction WNL Limited   Hip adduction    Hip internal rotation    Hip external rotation    Knee flexion WNL WNL  Knee extension WNL WNL  Ankle dorsiflexion    Ankle plantarflexion    Ankle inversion    Ankle eversion     (Blank rows = not tested)  LOWER EXTREMITY MMT: L LE not tested resistively - lifting L LE well against gravity   MMT Right eval Left eval  Hip flexion 5   Hip extension 5   Hip abduction 5   Hip adduction    Hip internal rotation    Hip external rotation    Knee flexion  Knee extension    Ankle dorsiflexion    Ankle plantarflexion    Ankle inversion    Ankle eversion     (Blank rows = not tested)   GAIT: Distance walked: 40 feet Assistive device utilized: Single point cane Level of assistance: Complete Independence Comments: slowed gait;  good gait pattern    [facility] Adult PT Treatment:                                                DATE: 02/07/23 Therapeutic Exercise: Nustep L7 x 8  Gait Ambulating forward and back Ambulating side to side R/L  Supine  Hip adduction w/ball 3 sec x 10 x 2  Hip abduction green 3 sec alternating LE's x 10 x 2  Bridging with green TB above knees 3 sec x 10 x 2 Hamstring stretch 30 sec x 2  Hamstring stretch with adduction and abduction 10-15 sec x 2 each  Hip flexor stretch L LE off edge of table PT guiding LE and pt using stretch out strap 30 sec x 2  Sidelying Clamshell L green TB 3 sec x 10  Hip abduction L leading with heel 3 sec x 10 x 2 Sitting  Prone  Standing Hip extension R/L 2 sec x 10 x 2  Hip abduction leading with heel 2 sec x 10 x 2  Forward walking; backward walking x 40 ft x 4 each     Manual Therapy: Myofacial work with massage stick in sitting  HEP Neuromuscular re-ed: Standing weight shifts  Gait:  Ambulating without cane 40 feet x 3  Backward walking with cane 40 feet x 3 Sidesteps  Modalities: Has TENS unit at home but has not had to use it has use heating pad on her back and sometimes ice on hip  [facility] Adult PT Treatment:                                                DATE: 02/05/23 Therapeutic Exercise: Nustep L7 x 8  Gait Ambulating forward and back Ambulating side to side R/L  Supine  Hip abduction green 3 sec alternating LE's x 10 x 2  Bridging with green TB above knees 3 sec x 10 x 2 Hamstring stretch 30 sec x 2  Hip flexor stretch L LE off edge of table PT guiding LE and pt using stretch out strap 20 sec x 2  Sidelying Clamshell green TB 3 sec x 10 L  Hip abduction leading with heel 3 sec x 10 x 2 Sitting  Anterior posterior pelvic tilt  2-3 sec x 5  Hamstring stretch with hinged hip 30 sec x 2 R/L Prone  Glut set 10 sec x 10 Hip extension 3 sec x 10 x 2  L Quad stretch 30 sec x 3 Standing Weight shift side to side Weight shift stagger stance frd/back  Wall squat 10 sec x 5  Hip extension L 2-3 sec x 10  Hip abduction in doorway leading with heel 3 sec x 10 R/L   Manual Therapy: Myofacial work with massage stick in sitting  Neuromuscular re-ed: Standing weight shifts  Gait: SPC step through gait pattern 80 feet  Ambulating without cane 40 feet x  3  Backward walking with cane 40 feet x 3 Modalities: Has TENS unit at home   [facility] Adult PT Treatment:                                                DATE: 01/30/23 Therapeutic Exercise: Nustep L5 x 7  Supine  Hamstring stretch 30 sec x 2  Quad set 5 sec x 10  SLR small range 3 sec x 10  SAQ on bolster 3 sec x 10   Hip flexor stretch L LE off edge of table PT guiding LE and pt using stretch out strap 20 sec x 2  Sidelying Hip abduction leading with heel 3 sec x 10 x 2 Sitting  Anterior posterior pelvic tilt  2-3 sec x 5  Hamstring stretch with  hinged hip 30 sec x 2 R/L Prone  Glut set 10 sec x 10 Hip extension 3 sec x 10 L Standing Weight shift side to side Weight shift stagger stance frd/back  Wall squat 10 sec x 5  Hip extension L 2-3 sec x 10  Hip abduction in doorway leading with heel 3 sec x 10 R/L   Manual Therapy: Myofacial work with massage stick in sitting  Neuromuscular re-ed: Standing weight shifts  Gait: SPC step through gait pattern 80 feet  Ambulating without cane 40 feet x 3  Backward walking with cane 40 feet x 3 Modalities: Has TENS unit at home   PATIENT EDUCATION:  Education details: POC; HEP Person educated: Patient Education method: Programmer, multimedia, Facilities manager, Actor cues, Verbal cues, and Handouts Education comprehension: verbalized understanding, returned demonstration, verbal cues required, tactile cues required, and needs further education  HOME EXERCISE PROGRAM:  Access Code: [email or web address] URL: [email or web address] Date: 02/05/2023 Prepared by: Corlis Leak  Exercises - Supine Transversus Abdominis Bracing with Pelvic Floor Contraction  - 2 x daily - 7 x weekly - 1 sets - 10 reps - 10sec  hold - Seated Anterior Pelvic Tilt  - 2 x daily - 7 x weekly - 1 sets - 10 reps - 2-3 sec  hold - Hip Flexor Stretch at Edge of Bed  - 2 x daily - 7 x weekly - 1 sets - 3 reps - 30 sec  hold - Wall Quarter Squat  - 2 x daily - 7 x weekly - 1-2 sets - 10 reps - 5-10 sec  hold - Seated Hamstring Stretch  - 2 x daily - 7 x weekly - 1 sets - 3 reps - 30 sec  hold - Hooklying Hamstring Stretch with Strap  - 1 x daily - 7 x weekly - 1 sets - 3 reps - 30 sec  hold - Gastroc Stretch on Wall  - 1 x daily - 7 x weekly - 1 sets - 3 reps - 30 sec  hold - Soleus Stretch on Wall  - 1 x daily - 7 x weekly - 1 sets - 3 reps - 30 sec  hold - Supine Quad Set  - 1 x daily - 7 x weekly - 1 sets - 10 reps - 3 sec  hold - Small Range Straight Leg Raise  - 1 x daily - 7 x weekly - 1 sets - 10 reps - 5 sec  hold -  Supine Knee Extension Strengthening  - 1 x daily - 7 x weekly - 1-2  sets - 10 reps - 5 sec  hold - Sidelying Hip Abduction  - 1 x daily - 7 x weekly - 3 sets - 10 reps - 3-5 sec  hold - Prone Gluteal Sets  - 1 x daily - 7 x weekly - 1 sets - 10 reps - 10 sec  hold - Prone Hip Extension  - 1 x daily - 7 x weekly - 1 sets - 10 reps - 3 sec  hold - Prone Quadriceps Stretch with Strap  - 2 x daily - 7 x weekly - 1 sets - 3 reps - 30 sec  hold - Hooklying Isometric Clamshell  - 2 x daily - 7 x weekly - 1 sets - 10 reps - 3 sec  hold - Bridge with Hip Abduction and Resistance  - 2 x daily - 7 x weekly - 1-2 sets - 10 reps - 3 sec  hold - Side Stepping with Resistance at Thighs  - 1 x daily - 7 x weekly  ASSESSMENT:  CLINICAL IMPRESSION: Patient reports continued progress with decrease in pain and increased ability to sit, stand and walk. She is no longer using the cane in the house or for short distances in the community. She continues to be consistent with HEP.  Reviewed and progressed exercises. Good response to treatment with improved gait pattern and increased strength with resistive exercises. Progressing well.  Eval: Patient is a 67 y.o. female who was seen today for physical therapy evaluation and treatment s/p L THA 12/31/22. She presents with decreased L LE ROM, strength, function; abnormal gait pattern; limited functional activities; inability to participate in recreational activities and community activities. Patient will benefit from PT to address problems identified.   OBJECTIVE IMPAIRMENTS: Abnormal gait, decreased activity tolerance, decreased balance, decreased mobility, difficulty walking, decreased ROM, decreased strength, hypomobility, increased fascial restrictions, increased muscle spasms, impaired flexibility, improper body mechanics, postural dysfunction, and pain.    GOALS: Goals reviewed with patient? Yes  SHORT TERM GOALS: Target date: 03/05/2023   Independent in initial  HEP  Baseline: Goal status: INITIAL  2.  Independent gait without assistive device in home and short distance level surfaces ~ 100 ft  Baseline:  Goal status: INITIAL  LONG TERM GOALS: Target date: 04/16/2023   AROM L hip WFL's and pain free Baseline:  Goal status: INITIAL  2.  5/5 strength L LE  Baseline:  Goal status: INITIAL  3.  Normal gait pattern with no assistive device for community distances ~ 400-500 ft  Baseline:  Goal status: INITIAL  4.  Independent in HEP including aquatic exercise program  Baseline:  Goal status: INITIAL  5.  Improve functional limitation score to 53 Baseline:  Goal status: INITIAL  6.  Return to community based recreational activities including gym program and walking  Baseline:  Goal status: INITIAL   PLAN:  PT FREQUENCY: 2x/week  PT DURATION: 12 weeks  PLANNED INTERVENTIONS: Therapeutic exercises, Therapeutic activity, Neuromuscular re-education, Balance training, Gait training, Patient/Family education, Self Care, Joint mobilization, Stair training, DME instructions, Aquatic Therapy, Dry Needling, Electrical stimulation, Spinal mobilization, Cryotherapy, Moist heat, Taping, Ultrasound, Ionotophoresis 4mg /ml Dexamethasone, Manual therapy, and Re-evaluation  PLAN FOR NEXT SESSION: review and progress exercise; continue eduction re-post op precautions following THA; manual work, DN, modalities as indicated    [employer], PT 02/07/2023, 3:29 PM

## 2023-02-11 ENCOUNTER — Encounter: Payer: Self-pay | Admitting: Rehabilitative and Restorative Service Providers"

## 2023-02-11 ENCOUNTER — Ambulatory Visit: Payer: Medicare Other | Admitting: Rehabilitative and Restorative Service Providers"

## 2023-02-11 DIAGNOSIS — Z471 Aftercare following joint replacement surgery: Secondary | ICD-10-CM | POA: Diagnosis not present

## 2023-02-11 DIAGNOSIS — M6281 Muscle weakness (generalized): Secondary | ICD-10-CM

## 2023-02-11 DIAGNOSIS — R2689 Other abnormalities of gait and mobility: Secondary | ICD-10-CM

## 2023-02-11 DIAGNOSIS — R29898 Other symptoms and signs involving the musculoskeletal system: Secondary | ICD-10-CM

## 2023-02-11 DIAGNOSIS — M25552 Pain in left hip: Secondary | ICD-10-CM

## 2023-02-11 DIAGNOSIS — M25652 Stiffness of left hip, not elsewhere classified: Secondary | ICD-10-CM

## 2023-02-11 NOTE — Therapy (Signed)
OUTPATIENT PHYSICAL THERAPY LOWER EXTREMITY TREATMENT    Patient Name: Jamie [name] MRN: [id number] DOB:05/03/1956, 67 y.o., female Today's Date: 02/11/2023  END OF SESSION:  PT End of Session - 02/11/23 1443     Visit Number 6    Number of Visits 24    Date for PT Re-Evaluation 04/16/23    Authorization Type medicare and [employer]    Progress Note Due on Visit 10    PT Start Time 1443    PT Stop Time 1528    PT Time Calculation (min) 45 min    Activity Tolerance Patient tolerated treatment well             Past Medical History:  Diagnosis Date   Allergy    Anemia    Hx   Anxiety    Occasionally   Arthritis    feet   Basal cell carcinoma    Breast cancer (HCC) 06/2016   left breast   Colon polyps    Depression    Eczema    Genetic testing 08/15/2016   Ms. Dimino underwent genetic testing for hereditary cancer syndrome through [facility]'s 43-gene Common Hereditary Cancers Panel. Ms. 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 Result report is dated 08/15/2016. Please see genetic counseling documentation from 08/17/2016 for further discussion.   History of radiation therapy 01/02/17-01/31/17   left breast was treated to 42.72 Gy in 16 fractions, left breast boost 10 Gy in 5 fractions   Hyperlipidemia    Personal history of chemotherapy    Personal history of radiation therapy    Pneumonia    2000s   PONV (postoperative nausea and vomiting)    Past Surgical History:  Procedure Laterality Date   BONE SPUR Bilateral 2001 AND 1988   BREAST BIOPSY     BREAST LUMPECTOMY Left    07/2016   BREAST LUMPECTOMY WITH RADIOACTIVE SEED AND SENTINEL LYMPH NODE BIOPSY Left 07/26/2016   Procedure: BREAST LUMPECTOMY WITH RADIOACTIVE SEED AND SENTINEL LYMPH NODE BIOPSY;  Surgeon: Almond Lint, MD;  Location: [facility];  Service: General;  Laterality: Left;   BUNIONECTOMY Bilateral 02/2012    COLONOSCOPY W/ POLYPECTOMY  08/2007   DILATION AND CURETTAGE OF UTERUS  2002   MOHS SURGERY  2010   PORT-A-CATH REMOVAL N/A 07/18/2017   Procedure: REMOVAL PORT-A-CATH;  Surgeon: Almond Lint, MD;  Location: [facility];  Service: General;  Laterality: N/A;   PORTACATH PLACEMENT Right 07/26/2016   Procedure: INSERTION PORT-A-CATH;  Surgeon: Almond Lint, MD;  Location:  SUR[facility]ERY [facility];  Service: General;  Laterality: Right;   TOTAL HIP ARTHROPLASTY Left 12/31/2022   Procedure: LEFT TOTAL HIP REPLACEMENT;  Surgeon: Tarry Kos, MD;  Location: [facility];  Service: Orthopedics;  Laterality: Left;  3-C   Patient Active Problem List   Diagnosis Date Noted   Status post total replacement of left hip 12/31/2022   Prediabetes 11/13/2022   Chronic left hip pain 05/08/2022   Family history of coronary arteriosclerosis 10/13/2021   Primary osteoarthritis of left hip 03/23/2021   Family history of osteoporosis 06/12/2018   History of breast cancer 06/12/2018   Advance directive discussed with patient 04/03/2018   Pap smear of cervix declined 04/03/2018   Lumbago 08/16/2017   Basal cell carcinoma 07/17/2017   Vaginismus 07/17/2017   Abnormal TSH 03/28/2017   Diarrhea 11/05/2016   Port catheter in place 10/23/2016   Genetic testing 08/15/2016  Malignant neoplasm of upper-outer quadrant of left breast in female, estrogen receptor positive (HCC) 07/10/2016   Allergic rhinitis 06/13/2016   History of basal cell carcinoma 06/13/2016   Eczema 06/13/2016   Hyperlipemia 06/13/2016   History of colon polyps 06/13/2016   Plantar fascial fibromatosis 06/13/2016   RLS (restless legs syndrome) 06/13/2016   Subclinical hypothyroidism 07/20/2013   Bunion 01/21/2013   Osteopenia 01/20/2013    PCP: Dr Dewayne Hatch [place]  REFERRING PROVIDER: Dr Tarry Kos  REFERRING DIAG: s/p L THA  THERAPY DIAG:  Pain in left hip  Muscle weakness (generalized)  Stiffness of left hip, not elsewhere  classified  Other symptoms and signs involving the musculoskeletal system  Other abnormalities of gait and mobility  Rationale for Evaluation and Treatment: Rehabilitation  ONSET DATE: Surgery:  12/31/22  SUBJECTIVE:   SUBJECTIVE STATEMENT: Doing well overall. Sometimes difficulty ascending stairs. Going down is no problem. Hard to put on her sock. Can do it but it feels tight. Hip and leg continue to improve. Exercises are going well.    Eval: L hip pain for a few years with gradual progression of symptoms. She has significant increase in symptoms in the past 1.5 years. MRI showed degenerative changes L hip. Underwent L THA anterior approach 12/31/22 with no complications.   PERTINENT HISTORY: Breast cancer 2018; chronic L hip pain PAIN:  Are you having pain? Yes: NPRS scale: 2/10 Pain location: L hip  Pain description: almost constant ache not really pain  Aggravating factors: sitting; moving sit to stand   Relieving factors: meds; ice; avoiding activities; lying down   PRECAUTIONS: Anterior hip  WEIGHT BEARING RESTRICTIONS: No  FALLS:  Has patient fallen in last 6 months? 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  Walking; gym 4 days/wk; sewing; church   PATIENT GOALS: get rid of the cane walking; return to normal activities including gym programs  NEXT MD VISIT: 02/12/23  OBJECTIVE:   DIAGNOSTIC FINDINGS: xray 10/23/22 Spurring of the femoral head consistent with advanced left hip  degenerative joint disease  PATIENT SURVEYS:  FOTO 19; goal 53   SENSATION: Numbness L lateral hip area   EDEMA:  Mild in L lateral hip   MUSCLE LENGTH: Hamstrings:  Right 80 deg; Left 80 deg Thomas test - modified: Left 10 deg  POSTURE: rounded shoulders, forward head, flexed trunk , and weight shift right  PALPATION: Tightness anterior L thigh through quads/hip flexors   LOWER EXTREMITY ROM:  Active Assistive ROM Right eval Left eval Left  02/11/23  Hip flexion WNL 90 106  Hip extension WNL 0 +10  Hip abduction WNL Limited    Hip adduction     Hip internal rotation   Tight   Hip external rotation   Tight   Knee flexion WNL WNL   Knee extension WNL WNL   Ankle dorsiflexion     Ankle plantarflexion     Ankle inversion     Ankle eversion      (Blank rows = not tested)  LOWER EXTREMITY MMT: L LE not tested resistively - lifting L LE well against gravity   MMT Right eval Left eval  Hip flexion 5   Hip  extension 5   Hip abduction 5   Hip adduction    Hip internal rotation    Hip external rotation    Knee flexion    Knee extension    Ankle dorsiflexion    Ankle plantarflexion    Ankle inversion    Ankle eversion     (Blank rows = not tested)   GAIT: Distance walked: 40 feet Assistive device utilized: Single point cane Level of assistance: Complete Independence Comments: slowed gait;  good gait pattern    [facility] Adult PT Treatment:                                                DATE: 02/11/23 Therapeutic Exercise: Recumbent bike L3 x 8  Gait Ambulating forward and back Ambulating side to side R/L  Supine  Hamstring stretch 30 sec x 1  Hamstring stretch hip abduction, stretching abduction 20-30 sec x 3 each  Hip flexor stretch L LE off edge of table PT guiding LE and pt using stretch out strap 30 sec x 2  Sidelying  Sitting   Prone   Standing Hip extension R/L 2 sec x 10 x 3  Hip abduction leading with heel 2 sec x 10 x 2  Forward walking; backward walking x 40 ft x 4 each  SLS on air-ex 4 -6 reps UE support as needed SLS with fwd reach x 10 reps  Step up 6 inch step x 10 R/L  Lateral heel tap 4 inch step x 10  R/L (UE support on L)   [facility] Adult PT Treatment:                                                DATE: 02/07/23 Therapeutic Exercise: Nustep L7 x 8  Gait Ambulating forward and back Ambulating side to side R/L  Supine  Hip adduction w/ball 3 sec x 10 x 2  Hip abduction green 3 sec alternating LE's x 10 x 2  Bridging with green TB above knees 3 sec x 10 x 2 Hamstring stretch 30 sec x 2  Hamstring stretch with adduction and abduction 10-15 sec x 2 each  Hip flexor stretch L LE off edge of table PT guiding LE and pt using stretch out strap 30 sec x 2  Sidelying Clamshell L green TB 3 sec x 10  Hip abduction L leading with heel 3 sec x 10 x 2 Sitting  Prone  Standing Hip extension R/L 2 sec x 10 x 2  Hip abduction leading with heel 2 sec x 10 x 2  Forward walking; backward walking x 40 ft x 4 each     Manual Therapy: Myofacial work with massage stick in sitting HEP Neuromuscular re-ed: Standing weight shifts  Gait:  Ambulating without cane 40 feet x 3  Backward walking with cane 40 feet x 3 Sidesteps  Modalities: Has TENS unit at home but has not had to use it has use heating pad on her back and sometimes ice on hip   PATIENT EDUCATION:  Education details: POC; HEP Person educated: Patient Education method: Programmer, multimedia, Demonstration, Tactile cues, Verbal cues, and Handouts Education comprehension: verbalized understanding, returned demonstration, verbal cues required, tactile cues required, and needs  further education  HOME EXERCISE PROGRAM:  Access Code: [email or web address] URL: [email or web address] Date: 02/05/2023 Prepared by: Corlis Leak  Exercises - Supine Transversus Abdominis Bracing with Pelvic Floor Contraction  - 2 x daily - 7 x weekly - 1 sets - 10 reps - 10sec  hold - Seated Anterior Pelvic Tilt  - 2 x daily - 7 x weekly - 1 sets - 10 reps - 2-3 sec  hold - Hip Flexor Stretch at Edge of Bed  - 2 x daily - 7 x weekly - 1 sets - 3 reps - 30 sec  hold -  Wall Quarter Squat  - 2 x daily - 7 x weekly - 1-2 sets - 10 reps - 5-10 sec  hold - Seated Hamstring Stretch  - 2 x daily - 7 x weekly - 1 sets - 3 reps - 30 sec  hold - Hooklying Hamstring Stretch with Strap  - 1 x daily - 7 x weekly - 1 sets - 3 reps - 30 sec  hold - Gastroc Stretch on Wall  - 1 x daily - 7 x weekly - 1 sets - 3 reps - 30 sec  hold - Soleus Stretch on Wall  - 1 x daily - 7 x weekly - 1 sets - 3 reps - 30 sec  hold - Supine Quad Set  - 1 x daily - 7 x weekly - 1 sets - 10 reps - 3 sec  hold - Small Range Straight Leg Raise  - 1 x daily - 7 x weekly - 1 sets - 10 reps - 5 sec  hold - Supine Knee Extension Strengthening  - 1 x daily - 7 x weekly - 1-2 sets - 10 reps - 5 sec  hold - Sidelying Hip Abduction  - 1 x daily - 7 x weekly - 3 sets - 10 reps - 3-5 sec  hold - Prone Gluteal Sets  - 1 x daily - 7 x weekly - 1 sets - 10 reps - 10 sec  hold - Prone Hip Extension  - 1 x daily - 7 x weekly - 1 sets - 10 reps - 3 sec  hold - Prone Quadriceps Stretch with Strap  - 2 x daily - 7 x weekly - 1 sets - 3 reps - 30 sec  hold - Hooklying Isometric Clamshell  - 2 x daily - 7 x weekly - 1 sets - 10 reps - 3 sec  hold - Bridge with Hip Abduction and Resistance  - 2 x daily - 7 x weekly - 1-2 sets - 10 reps - 3 sec  hold - Side Stepping with Resistance at Thighs  - 1 x daily - 7 x weekly  ASSESSMENT:  CLINICAL IMPRESSION: Patient reports and demonstrates continued progress with decrease in pain and increased ability to sit, stand and walk. She is consistent with HEP.  Reviewed and progressed exercises including work on balance activities. Good response to treatment with improved gait pattern and increased strength with resistive exercises. Progressing well toward stated goals of therapy.   Eval: Patient is a 67 y.o. female who was seen today for physical therapy evaluation and treatment s/p L THA 12/31/22. She presents with decreased L LE ROM, strength, function; abnormal gait pattern;  limited functional activities; inability to participate in recreational activities and community activities. Patient will benefit from PT to address problems identified.   OBJECTIVE IMPAIRMENTS: Abnormal gait, decreased activity tolerance, decreased balance, decreased mobility, difficulty walking, decreased ROM, decreased  strength, hypomobility, increased fascial restrictions, increased muscle spasms, impaired flexibility, improper body mechanics, postural dysfunction, and pain.    GOALS: Goals reviewed with patient? Yes  SHORT TERM GOALS: Target date: 03/05/2023   Independent in initial HEP  Baseline: Goal status: met  2.  Independent gait without assistive device in home and short distance level surfaces ~ 100 ft  Baseline:  Goal status: met  LONG TERM GOALS: Target date: 04/16/2023   AROM L hip WFL's and pain free Baseline:  Goal status: on going   2.  5/5 strength L LE  Baseline:  Goal status: on going   3.  Normal gait pattern with no assistive device for community distances ~ 400-500 ft  Baseline:  Goal status: on going   4.  Independent in HEP including aquatic exercise program  Baseline:  Goal status: on going   5.  Improve functional limitation score to 53 Baseline:  Goal status: on going   6.  Return to community based recreational activities including gym program and walking  Baseline:  Goal status: on going    PLAN:  PT FREQUENCY: 2x/week  PT DURATION: 12 weeks  PLANNED INTERVENTIONS: Therapeutic exercises, Therapeutic activity, Neuromuscular re-education, Balance training, Gait training, Patient/Family education, Self Care, Joint mobilization, Stair training, DME instructions, Aquatic Therapy, Dry Needling, Electrical stimulation, Spinal mobilization, Cryotherapy, Moist heat, Taping, Ultrasound, Ionotophoresis 4mg /ml Dexamethasone, Manual therapy, and Re-evaluation  PLAN FOR NEXT SESSION: review and progress exercise; continue eduction re-post op  precautions following THA; manual work, DN, modalities as indicated    [employer], PT 02/11/2023, 2:45 PM

## 2023-02-12 ENCOUNTER — Other Ambulatory Visit (INDEPENDENT_AMBULATORY_CARE_PROVIDER_SITE_OTHER): Payer: Medicare Other

## 2023-02-12 ENCOUNTER — Ambulatory Visit (INDEPENDENT_AMBULATORY_CARE_PROVIDER_SITE_OTHER): Payer: Medicare Other | Admitting: Orthopaedic Surgery

## 2023-02-12 ENCOUNTER — Telehealth: Payer: Self-pay | Admitting: *Deleted

## 2023-02-12 DIAGNOSIS — Z96642 Presence of left artificial hip joint: Secondary | ICD-10-CM

## 2023-02-12 NOTE — Progress Notes (Signed)
Post-Op Visit Note   Patient: Jamie [name]           Date of Birth: [birth year]           MRN: [id number] Visit Date: 02/12/2023 PCP: Jeani Sow, MD   Assessment & Plan:  Chief Complaint:  Chief Complaint  Patient presents with   Left Hip - Routine Post Op    Left THA 12/31/22   Visit Diagnoses:  1. Status post total replacement of left hip     Plan: Jamie [name] is 6 weeks status post left total hip replacement.  Overall doing okay.  Reports some soreness and achiness and numbness around the thigh.  She is walking without a cane now.  Examination of the left hip shows fully healed surgical scar.  She has improving range of motion without pain.  Jamie [name] is recovering well from my standpoint.  Dental prophylaxis reinforced.  Recheck in 6 weeks for 47-month postop visit.  Follow-Up Instructions: Return in about 6 weeks (around 03/26/2023) for with lindsey.   Orders:  Orders Placed This Encounter  Procedures   XR Pelvis 1-2 Views   No orders of the defined types were placed in this encounter.   Imaging: XR Pelvis 1-2 Views  Result Date: 02/12/2023 Stable total hip replacement without complications   PMFS History: Patient Active Problem List   Diagnosis Date Noted   Status post total replacement of left hip 12/31/2022   Prediabetes 11/13/2022   Chronic left hip pain 05/08/2022   Family history of coronary arteriosclerosis 10/13/2021   Primary osteoarthritis of left hip 03/23/2021   Family history of osteoporosis 06/12/2018   History of breast cancer 06/12/2018   Advance directive discussed with patient 04/03/2018   Pap smear of cervix declined 04/03/2018   Lumbago 08/16/2017   Basal cell carcinoma 07/17/2017   Vaginismus 07/17/2017   Abnormal TSH 03/28/2017   Diarrhea 11/05/2016   Port catheter in place 10/23/2016   Genetic testing 08/15/2016   Malignant neoplasm of upper-outer quadrant of left breast in female, estrogen receptor positive (HCC) 07/10/2016    Allergic rhinitis 06/13/2016   History of basal cell carcinoma 06/13/2016   Eczema 06/13/2016   Hyperlipemia 06/13/2016   History of colon polyps 06/13/2016   Plantar fascial fibromatosis 06/13/2016   RLS (restless legs syndrome) 06/13/2016   Subclinical hypothyroidism 07/20/2013   Bunion 01/21/2013   Osteopenia 01/20/2013   Past Medical History:  Diagnosis Date   Allergy    Anemia    Hx   Anxiety    Occasionally   Arthritis    feet   Basal cell carcinoma    Breast cancer (HCC) 06/2016   left breast   Colon polyps    Depression    Eczema    Genetic testing 08/15/2016   Ms. Ramsburg underwent genetic testing for hereditary cancer syndrome through [facility]'s 43-gene Common Hereditary Cancers Panel. Ms. 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 Result report is dated 08/15/2016. Please see genetic counseling documentation from 08/17/2016 for further discussion.   History of radiation therapy 01/02/17-01/31/17   left breast was treated to 42.72 Gy in 16 fractions, left breast boost 10 Gy in 5 fractions   Hyperlipidemia    Personal history of chemotherapy    Personal history of radiation therapy    Pneumonia    2000s   PONV (postoperative nausea and vomiting)     Family History  Problem Relation Age  of Onset   Hyperlipidemia Mother    Ovarian cancer Mother 59   Asthma Mother    Cancer Mother    Hyperlipidemia Father    Cancer Father    Hypertension Father    Stroke Father    Heart disease Father    Depression Father    Heart attack Father    Hyperlipidemia Sister    Arthritis Sister    Osteoporosis Sister    Breast cancer Maternal Aunt 42       recurred at 90   Liver cancer Maternal Uncle 77   Heart attack Maternal Grandmother    Heart attack Paternal Grandmother    Heart disease Paternal Grandmother    Colon cancer Neg Hx    Colon polyps Neg Hx    Esophageal cancer Neg Hx    Rectal cancer  Neg Hx    Stomach cancer Neg Hx     Past Surgical History:  Procedure Laterality Date   BONE SPUR Bilateral 2001 AND 1988   BREAST BIOPSY     BREAST LUMPECTOMY Left    07/2016   BREAST LUMPECTOMY WITH RADIOACTIVE SEED AND SENTINEL LYMPH NODE BIOPSY Left 07/26/2016   Procedure: BREAST LUMPECTOMY WITH RADIOACTIVE SEED AND SENTINEL LYMPH NODE BIOPSY;  Surgeon: Almond Lint, MD;  Location: [facility];  Service: General;  Laterality: Left;   BUNIONECTOMY Bilateral 02/2012   COLONOSCOPY W/ POLYPECTOMY  08/2007   DILATION AND CURETTAGE OF UTERUS  2002   MOHS SURGERY  2010   PORT-A-CATH REMOVAL N/A 07/18/2017   Procedure: REMOVAL PORT-A-CATH;  Surgeon: Almond Lint, MD;  Location: [facility];  Service: General;  Laterality: N/A;   PORTACATH PLACEMENT Right 07/26/2016   Procedure: INSERTION PORT-A-CATH;  Surgeon: Almond Lint, MD;  Location: [facility];  Service: General;  Laterality: Right;   TOTAL HIP ARTHROPLASTY Left 12/31/2022   Procedure: LEFT TOTAL HIP REPLACEMENT;  Surgeon: Tarry Kos, MD;  Location: [facility];  Service: Orthopedics;  Laterality: Left;  3-C   Social History   Occupational History   Occupation: retired  Tobacco Use   Smoking status: Never    Passive exposure: Never   Smokeless tobacco: Never  Vaping Use   Vaping status: Never Used  Substance and Sexual Activity   Alcohol use: Not Currently    Comment: occasionally   Drug use: No   Sexual activity: Yes    Birth control/protection: Post-menopausal

## 2023-02-12 NOTE — Telephone Encounter (Signed)
Ortho bundle in office meeting completed.

## 2023-02-14 ENCOUNTER — Encounter: Payer: Self-pay | Admitting: Rehabilitative and Restorative Service Providers"

## 2023-02-14 ENCOUNTER — Ambulatory Visit: Payer: Medicare Other | Admitting: Rehabilitative and Restorative Service Providers"

## 2023-02-14 DIAGNOSIS — M25552 Pain in left hip: Secondary | ICD-10-CM

## 2023-02-14 DIAGNOSIS — R29898 Other symptoms and signs involving the musculoskeletal system: Secondary | ICD-10-CM

## 2023-02-14 DIAGNOSIS — Z471 Aftercare following joint replacement surgery: Secondary | ICD-10-CM | POA: Diagnosis not present

## 2023-02-14 DIAGNOSIS — M6281 Muscle weakness (generalized): Secondary | ICD-10-CM

## 2023-02-14 DIAGNOSIS — R2689 Other abnormalities of gait and mobility: Secondary | ICD-10-CM

## 2023-02-14 DIAGNOSIS — M25652 Stiffness of left hip, not elsewhere classified: Secondary | ICD-10-CM

## 2023-02-14 NOTE — Therapy (Signed)
OUTPATIENT PHYSICAL THERAPY LOWER EXTREMITY TREATMENT AND DISCHARGE SUMMARY   PHYSICAL THERAPY DISCHARGE SUMMARY  Visits from Start of Care: 7  Current functional level related to goals / functional outcomes: See progress note for discharge status    Remaining deficits: Needs to continue with HEP to achieve maximum rehab potential    Education / Equipment: HEP    Patient agrees to discharge. Patient goals were partially met. Patient is being discharged due to being pleased with the current functional level.  Ardell Aaronson P. Leonor Liv PT, MPH 02/14/23 11:26 AM  Patient Name: Jamie [name] MRN: [id number] DOB:June 02, 1955, 67 y.o., female Today's Date: 02/14/2023  END OF SESSION:  PT End of Session - 02/14/23 1101     Visit Number 7    Number of Visits 24    Date for PT Re-Evaluation 04/16/23    Authorization Type medicare and [employer]    Progress Note Due on Visit 10    PT Start Time 1058    PT Stop Time 1144    PT Time Calculation (min) 46 min    Activity Tolerance Patient tolerated treatment well             Past Medical History:  Diagnosis Date   Allergy    Anemia    Hx   Anxiety    Occasionally   Arthritis    feet   Basal cell carcinoma    Breast cancer (HCC) 06/2016   left breast   Colon polyps    Depression    Eczema    Genetic testing 08/15/2016   Ms. Thull underwent genetic testing for hereditary cancer syndrome through [facility]'s 43-gene Common Hereditary Cancers Panel. Ms. 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 Result report is dated 08/15/2016. Please see genetic counseling documentation from 08/17/2016 for further discussion.   History of radiation therapy 01/02/17-01/31/17   left breast was treated to 42.72 Gy in 16 fractions, left breast boost 10 Gy in 5 fractions   Hyperlipidemia    Personal history of chemotherapy    Personal history of radiation therapy    Pneumonia    2000s    PONV (postoperative nausea and vomiting)    Past Surgical History:  Procedure Laterality Date   BONE SPUR Bilateral 2001 AND 1988   BREAST BIOPSY     BREAST LUMPECTOMY Left    07/2016   BREAST LUMPECTOMY WITH RADIOACTIVE SEED AND SENTINEL LYMPH NODE BIOPSY Left 07/26/2016   Procedure: BREAST LUMPECTOMY WITH RADIOACTIVE SEED AND SENTINEL LYMPH NODE BIOPSY;  Surgeon: Almond Lint, MD;  Location: [facility];  Service: General;  Laterality: Left;   BUNIONECTOMY Bilateral 02/2012   COLONOSCOPY W/ POLYPECTOMY  08/2007   DILATION AND CURETTAGE OF UTERUS  2002   MOHS SURGERY  2010   PORT-A-CATH REMOVAL N/A 07/18/2017   Procedure: REMOVAL PORT-A-CATH;  Surgeon: Almond Lint, MD;  Location: [facility];  Service: General;  Laterality: N/A;   PORTACATH PLACEMENT Right 07/26/2016   Procedure: INSERTION PORT-A-CATH;  Surgeon: Almond Lint, MD;  Location: [facility];  Service: General;  Laterality: Right;   TOTAL HIP ARTHROPLASTY Left 12/31/2022   Procedure: LEFT TOTAL HIP REPLACEMENT;  Surgeon: Tarry Kos, MD;  Location: [facility];  Service: Orthopedics;  Laterality: Left;  3-C   Patient Active Problem List   Diagnosis Date Noted   Status post total replacement of left hip 12/31/2022   Prediabetes 11/13/2022   Chronic  left hip pain 05/08/2022   Family history of coronary arteriosclerosis 10/13/2021   Primary osteoarthritis of left hip 03/23/2021   Family history of osteoporosis 06/12/2018   History of breast cancer 06/12/2018   Advance directive discussed with patient 04/03/2018   Pap smear of cervix declined 04/03/2018   Lumbago 08/16/2017   Basal cell carcinoma 07/17/2017   Vaginismus 07/17/2017   Abnormal TSH 03/28/2017   Diarrhea 11/05/2016   Port catheter in place 10/23/2016   Genetic testing 08/15/2016   Malignant neoplasm of upper-outer quadrant of left breast in female, estrogen receptor positive (HCC) 07/10/2016   Allergic rhinitis 06/13/2016   History of  basal cell carcinoma 06/13/2016   Eczema 06/13/2016   Hyperlipemia 06/13/2016   History of colon polyps 06/13/2016   Plantar fascial fibromatosis 06/13/2016   RLS (restless legs syndrome) 06/13/2016   Subclinical hypothyroidism 07/20/2013   Bunion 01/21/2013   Osteopenia 01/20/2013    PCP: Dr Dewayne Hatch [place]  REFERRING PROVIDER: Dr Tarry Kos  REFERRING DIAG: s/p L THA  THERAPY DIAG:  Pain in left hip  Muscle weakness (generalized)  Stiffness of left hip, not elsewhere classified  Other symptoms and signs involving the musculoskeletal system  Other abnormalities of gait and mobility  Rationale for Evaluation and Treatment: Rehabilitation  ONSET DATE: Surgery:  12/31/22  SUBJECTIVE:   SUBJECTIVE STATEMENT: Saw the doctor this week and he is pleased with progress. She is ready to continue with exercises on her on. Doing well. Doing better with ascending stairs. Going down is no problem. Getting easier to put on her sock. Can do it but it feels tight. Hip and leg continue to improve. Exercises are going well.    Eval: L hip pain for a few years with gradual progression of symptoms. She has significant increase in symptoms in the past 1.5 years. MRI showed degenerative changes L hip. Underwent L THA anterior approach 12/31/22 with no complications.   PERTINENT HISTORY: Breast cancer 2018; chronic L hip pain PAIN:  Are you having pain? Yes: NPRS scale: 0-2/10 Pain location: L hip  Pain description: almost constant ache not really pain  Aggravating factors: sitting; moving sit to stand   Relieving factors: meds; ice; avoiding activities; lying down   PRECAUTIONS: Anterior hip  WEIGHT BEARING RESTRICTIONS: No  FALLS:  Has patient fallen in last 6 months? 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  Walking; gym 4 days/wk; sewing; church   PATIENT GOALS: get rid of the cane walking; return to normal activities including gym programs  NEXT MD VISIT: 02/12/23  OBJECTIVE:   DIAGNOSTIC FINDINGS: xray 10/23/22 Spurring of the femoral head consistent with advanced left hip  degenerative joint disease  PATIENT SURVEYS:  FOTO 19; goal 53 02/14/23: 55   SENSATION: Numbness L lateral hip area   EDEMA:  Mild in L lateral hip   MUSCLE LENGTH: Hamstrings: Right 80 deg; Left 80 deg Thomas test - modified: Left 10 deg  POSTURE: rounded shoulders, forward head, flexed trunk , and weight shift right  PALPATION: Tightness anterior L thigh through quads/hip flexors   LOWER EXTREMITY ROM:  Active Assistive ROM Right eval Left eval Left  02/11/23  Hip flexion  WNL 90 106  Hip extension WNL 0 +10  Hip abduction WNL Limited    Hip adduction     Hip internal rotation   Tight   Hip external rotation   Tight   Knee flexion WNL WNL   Knee extension WNL WNL   Ankle dorsiflexion     Ankle plantarflexion     Ankle inversion     Ankle eversion      (Blank rows = not tested)  LOWER EXTREMITY MMT: L LE not tested resistively - lifting L LE well against gravity   MMT Right eval Left eval  Hip flexion 5   Hip extension 5   Hip abduction 5   Hip adduction    Hip internal rotation    Hip external rotation    Knee flexion    Knee extension    Ankle dorsiflexion    Ankle plantarflexion    Ankle inversion    Ankle eversion     (Blank rows = not tested)   GAIT: Distance walked: 40 feet Assistive device utilized: Single point cane Level of assistance: Complete Independence Comments: slowed gait;  good gait pattern    [facility] Adult PT Treatment:                                                DATE: 02/14/23 Therapeutic Exercise: Recumbent  bike L3 x 8  Gait Ambulating forward and back Ambulating side to side R/L  Supine  Hamstring stretch 30 sec x 1  Hamstring stretch hip abduction, stretching abduction 20-30 sec x 3 each  Hip flexor stretch L LE off edge of table PT guiding LE and pt using stretch out strap 30 sec x 2  Sitting  Sit to stand x 10  Standing Wall slide 10 sec x 10  Hip extension R/L 2 sec x 10  Hip abduction leading with heel 2 sec x 10  Captain morgan  R hip to wall working L glut med 3 sec x 5 x 2  Forward walking; backward walking x 40 ft x 4 each  SLS with UE movements  SLS with fwd reach x 10 reps R/L  Step up 6 inch step x 10 R/L  Lateral heel tap 4 inch step x 10 R/L (UE support as needed)  SLS with fwd/back/side taps R/L  Side steps 10 ft x 5 R/L  Braiding 10 ft x 5 R/L  Forward lunge x 10 R/L  Lateral lunge x 10 R/L  Minitramp bouncing; marching; walking    [facility] Adult PT Treatment:                                                DATE: 02/11/23 Therapeutic Exercise: Recumbent bike L3 x 8  Gait Ambulating forward and back Ambulating side to side R/L  Supine  Hamstring stretch 30 sec x 1  Hamstring stretch hip abduction, stretching abduction 20-30 sec x 3 each  Hip flexor stretch L LE off edge of table PT guiding LE and pt using stretch out strap 30 sec x 2  Standing Hip extension R/L 2 sec x 10 x 3  Hip abduction leading with heel 2 sec x 10 x 2  Forward walking; backward  walking x 40 ft x 4 each  SLS on air-ex 4 -6 reps UE support as needed SLS with fwd reach x 10 reps  Step up 6 inch step x 10 R/L  Lateral heel tap 4 inch step x 10 R/L (UE support on L)    PATIENT EDUCATION:  Education details: POC; HEP Person educated: Patient Education method: Explanation, Demonstration, Tactile cues, Verbal cues, and Handouts Education comprehension: verbalized understanding, returned demonstration, verbal cues required, tactile cues required, and needs further education  HOME EXERCISE  PROGRAM: Access Code: [email or web address] URL: [email or web address] Date: 02/14/2023 Prepared by: Corlis Leak  Exercises - Supine Transversus Abdominis Bracing with Pelvic Floor Contraction  - 2 x daily - 7 x weekly - 1 sets - 10 reps - 10sec  hold - Seated Anterior Pelvic Tilt  - 2 x daily - 7 x weekly - 1 sets - 10 reps - 2-3 sec  hold - Hip Flexor Stretch at Edge of Bed  - 2 x daily - 7 x weekly - 1 sets - 3 reps - 30 sec  hold - Wall Quarter Squat  - 2 x daily - 7 x weekly - 1-2 sets - 10 reps - 5-10 sec  hold - Seated Hamstring Stretch  - 2 x daily - 7 x weekly - 1 sets - 3 reps - 30 sec  hold - Hooklying Hamstring Stretch with Strap  - 1 x daily - 7 x weekly - 1 sets - 3 reps - 30 sec  hold - Gastroc Stretch on Wall  - 1 x daily - 7 x weekly - 1 sets - 3 reps - 30 sec  hold - Soleus Stretch on Wall  - 1 x daily - 7 x weekly - 1 sets - 3 reps - 30 sec  hold - Supine Quad Set  - 1 x daily - 7 x weekly - 1 sets - 10 reps - 3 sec  hold - Small Range Straight Leg Raise  - 1 x daily - 7 x weekly - 1 sets - 10 reps - 5 sec  hold - Supine Knee Extension Strengthening  - 1 x daily - 7 x weekly - 1-2 sets - 10 reps - 5 sec  hold - Sidelying Hip Abduction  - 1 x daily - 7 x weekly - 3 sets - 10 reps - 3-5 sec  hold - Prone Gluteal Sets  - 1 x daily - 7 x weekly - 1 sets - 10 reps - 10 sec  hold - Prone Hip Extension  - 1 x daily - 7 x weekly - 1 sets - 10 reps - 3 sec  hold - Prone Quadriceps Stretch with Strap  - 2 x daily - 7 x weekly - 1 sets - 3 reps - 30 sec  hold - Hooklying Isometric Clamshell  - 2 x daily - 7 x weekly - 1 sets - 10 reps - 3 sec  hold - Bridge with Hip Abduction and Resistance  - 2 x daily - 7 x weekly - 1-2 sets - 10 reps - 3 sec  hold - Side Stepping with Resistance at Thighs  - 1 x daily - 7 x weekly - Single Leg Stance  - 1 x daily - 7 x weekly - 2 sets - 5 reps - 20 sec hold - The Diver  - 1 x daily - 7 x weekly - 1 sets - 10 reps - 2-3 sec  hold - Step Up  -  1 x  daily - 7 x weekly - 1 sets - 10 reps - 2 sec  hold - Lateral Step Up  - 1 x daily - 7 x weekly - 2 sets - 10 reps - 2 sec  hold - Glute Med Wall Lean  - 1 x daily - 7 x weekly - 1-2 sets - 10 reps - 3-5 sec  hold - Backwards Walking  - 1 x daily - 7 x weekly - 1 sets - 3 reps - 30 sec  hold - Carioca with Counter Support  - 1 x daily - 7 x weekly - 1 sets - 3 reps - 30 sec  hold - Heel Raises with Counter Support  - 1 x daily - 7 x weekly - 1 sets - 3 reps - 30 sec  hold - Standard Lunge  - 1 x daily - 7 x weekly - 1 sets - 10 reps - 2-3 sec  hold - Lateral Lunge  - 1 x daily - 7 x weekly - 1 sets - 10 reps - 2-3 sec  hold - Forward Step Down  - 2 x daily - 7 x weekly - 1-3 sets - 10 reps - 2 sec  hold   ASSESSMENT:  CLINICAL IMPRESSION: Patient reports and demonstrates continued progress with no pain and increased ability to sit, stand and walk. She is consistent with HEP.  Reviewed and progressed exercises including work on balance activities. Good response to treatment with improved gait pattern and increased strength with resistive exercises. Goals of therapy are partially accomplished. Patient will continue with independent HEP and call with any questions or problems.    Eval: Patient is a 67 y.o. female who was seen today for physical therapy evaluation and treatment s/p L THA 12/31/22. She presents with decreased L LE ROM, strength, function; abnormal gait pattern; limited functional activities; inability to participate in recreational activities and community activities. Patient will benefit from PT to address problems identified.   OBJECTIVE IMPAIRMENTS: Abnormal gait, decreased activity tolerance, decreased balance, decreased mobility, difficulty walking, decreased ROM, decreased strength, hypomobility, increased fascial restrictions, increased muscle spasms, impaired flexibility, improper body mechanics, postural dysfunction, and pain.    GOALS: Goals reviewed with patient?  Yes  SHORT TERM GOALS: Target date: 03/05/2023   Independent in initial HEP  Baseline: Goal status: met  2.  Independent gait without assistive device in home and short distance level surfaces ~ 100 ft  Baseline:  Goal status: met  LONG TERM GOALS: Target date: 04/16/2023   AROM L hip WFL's and pain free Baseline:  Goal status: met  2.  5/5 strength L LE  Baseline:  Goal status: on going   3.  Normal gait pattern with no assistive device for community distances ~ 400-500 ft  Baseline:  Goal status: met   4.  Independent in HEP including aquatic exercise program  Baseline:  Goal status: met for HEP    5.  Improve functional limitation score to 53 02/14/23: 55 Goal status: met   6.  Return to community based recreational activities including gym program and walking  Baseline:  Goal status: on going    PLAN:  PT FREQUENCY: 2x/week  PT DURATION: 12 weeks  PLANNED INTERVENTIONS: Therapeutic exercises, Therapeutic activity, Neuromuscular re-education, Balance training, Gait training, Patient/Family education, Self Care, Joint mobilization, Stair training, DME instructions, Aquatic Therapy, Dry Needling, Electrical stimulation, Spinal mobilization, Cryotherapy, Moist heat, Taping, Ultrasound, Ionotophoresis 4mg /ml Dexamethasone, Manual therapy, and Re-evaluation  PLAN  FOR NEXT SESSION: d/c to independent HEP patient will call with any questions or problems     Ezma Rehm Rober Minion, PT 02/14/2023, 11:02 AM

## 2023-02-20 ENCOUNTER — Ambulatory Visit (HOSPITAL_BASED_OUTPATIENT_CLINIC_OR_DEPARTMENT_OTHER): Payer: Medicare Other | Admitting: Physical Therapy

## 2023-02-21 ENCOUNTER — Telehealth: Payer: Self-pay | Admitting: *Deleted

## 2023-02-21 ENCOUNTER — Ambulatory Visit: Payer: Medicare Other | Admitting: Rehabilitative and Restorative Service Providers"

## 2023-02-21 NOTE — Telephone Encounter (Signed)
Should be fine.

## 2023-02-21 NOTE — Telephone Encounter (Signed)
Patient called today and asked about exercising. Goes to a senior fit class that does some cardiac, aerobic type things. No real running, but does do some bouncing and stepping in place as well as some band work, jumping type things. We discussed no yoga type stretching yet and no running, but unsure what else she shouldn't do. She mentioned twisting of the torso as well. She just wanted to make sure she wasn't going to hurt anything. I told I would get your take as well.

## 2023-02-22 ENCOUNTER — Ambulatory Visit (HOSPITAL_BASED_OUTPATIENT_CLINIC_OR_DEPARTMENT_OTHER): Payer: Medicare Other | Admitting: Physical Therapy

## 2023-02-26 ENCOUNTER — Ambulatory Visit (HOSPITAL_BASED_OUTPATIENT_CLINIC_OR_DEPARTMENT_OTHER): Payer: Medicare Other | Admitting: Physical Therapy

## 2023-02-28 ENCOUNTER — Ambulatory Visit (HOSPITAL_BASED_OUTPATIENT_CLINIC_OR_DEPARTMENT_OTHER): Payer: Medicare Other | Admitting: Physical Therapy

## 2023-02-28 ENCOUNTER — Encounter: Payer: Medicare Other | Admitting: Rehabilitative and Restorative Service Providers"

## 2023-03-07 ENCOUNTER — Encounter: Payer: Medicare Other | Admitting: Rehabilitative and Restorative Service Providers"

## 2023-03-07 ENCOUNTER — Encounter: Payer: Self-pay | Admitting: Family Medicine

## 2023-03-26 ENCOUNTER — Ambulatory Visit (INDEPENDENT_AMBULATORY_CARE_PROVIDER_SITE_OTHER): Payer: Medicare Other | Admitting: Physician Assistant

## 2023-03-26 ENCOUNTER — Telehealth: Payer: Self-pay | Admitting: *Deleted

## 2023-03-26 DIAGNOSIS — Z96642 Presence of left artificial hip joint: Secondary | ICD-10-CM

## 2023-03-26 NOTE — Telephone Encounter (Signed)
In office 90 day Ortho bundle visit completed. No further CM needs.

## 2023-03-26 NOTE — Progress Notes (Signed)
Post-Op Visit Note   Patient: Jamie [name]           Date of Birth: 27-Dec-1955           MRN: [id number] Visit Date: 03/26/2023 PCP: Jeani Sow, MD   Assessment & Plan:  Chief Complaint:  Chief Complaint  Patient presents with   Left Hip - Follow-up    12/31/22 LT THA   Visit Diagnoses:  1. Status post total replacement of left hip     Plan: Patient is a pleasant 66 year old female who comes in today 3 months status post left total hip replacement 12/31/2022.  She has been doing much better.  She has slight discomfort getting in and out of the bed but nothing more.  She has been working on exercises at the gym.  Examination of her left hip reveals painless hip flexion and logroll.  She is neurovascularly intact distally.  At this point, she will continue to advance with activity as tolerated.  Dental prophylaxis reinforced.  Follow-up in 3 months for repeat evaluation and AP pelvis x-rays.  Call with concerns or questions.  Follow-Up Instructions: Return in about 3 months (around 06/26/2023).   Orders:  No orders of the defined types were placed in this encounter.  No orders of the defined types were placed in this encounter.   Imaging: No new imaging  PMFS History: Patient Active Problem List   Diagnosis Date Noted   Status post total replacement of left hip 12/31/2022   Prediabetes 11/13/2022   Chronic left hip pain 05/08/2022   Family history of coronary arteriosclerosis 10/13/2021   Primary osteoarthritis of left hip 03/23/2021   Family history of osteoporosis 06/12/2018   History of breast cancer 06/12/2018   Advance directive discussed with patient 04/03/2018   Pap smear of cervix declined 04/03/2018   Lumbago 08/16/2017   Basal cell carcinoma 07/17/2017   Vaginismus 07/17/2017   Abnormal TSH 03/28/2017   Diarrhea 11/05/2016   Port catheter in place 10/23/2016   Genetic testing 08/15/2016   Malignant neoplasm of upper-outer quadrant of left breast in  female, estrogen receptor positive (HCC) 07/10/2016   Allergic rhinitis 06/13/2016   History of basal cell carcinoma 06/13/2016   Eczema 06/13/2016   Hyperlipemia 06/13/2016   History of colon polyps 06/13/2016   Plantar fascial fibromatosis 06/13/2016   RLS (restless legs syndrome) 06/13/2016   Subclinical hypothyroidism 07/20/2013   Bunion 01/21/2013   Osteopenia 01/20/2013   Past Medical History:  Diagnosis Date   Allergy    Anemia    Hx   Anxiety    Occasionally   Arthritis    feet   Basal cell carcinoma    Breast cancer (HCC) 06/2016   left breast   Colon polyps    Depression    Eczema    Genetic testing 08/15/2016   Ms. Flakes underwent genetic testing for hereditary cancer syndrome through [facility]'s 43-gene Common Hereditary Cancers Panel. Ms. 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 Result report is dated 08/15/2016. Please see genetic counseling documentation from 08/17/2016 for further discussion.   History of radiation therapy 01/02/17-01/31/17   left breast was treated to 42.72 Gy in 16 fractions, left breast boost 10 Gy in 5 fractions   Hyperlipidemia    Personal history of chemotherapy    Personal history of radiation therapy    Pneumonia    2000s   PONV (postoperative nausea and  vomiting)     Family History  Problem Relation Age of Onset   Hyperlipidemia Mother    Ovarian cancer Mother 110   Asthma Mother    Cancer Mother    Hyperlipidemia Father    Cancer Father    Hypertension Father    Stroke Father    Heart disease Father    Depression Father    Heart attack Father    Hyperlipidemia Sister    Arthritis Sister    Osteoporosis Sister    Breast cancer Maternal Aunt 75       recurred at 1   Liver cancer Maternal Uncle 77   Heart attack Maternal Grandmother    Heart attack Paternal Grandmother    Heart disease Paternal Grandmother    Colon cancer Neg Hx    Colon polyps  Neg Hx    Esophageal cancer Neg Hx    Rectal cancer Neg Hx    Stomach cancer Neg Hx     Past Surgical History:  Procedure Laterality Date   BONE SPUR Bilateral 2001 AND 1988   BREAST BIOPSY     BREAST LUMPECTOMY Left    07/2016   BREAST LUMPECTOMY WITH RADIOACTIVE SEED AND SENTINEL LYMPH NODE BIOPSY Left 07/26/2016   Procedure: BREAST LUMPECTOMY WITH RADIOACTIVE SEED AND SENTINEL LYMPH NODE BIOPSY;  Surgeon: Almond Lint, MD;  Location: [facility];  Service: General;  Laterality: Left;   BUNIONECTOMY Bilateral 02/2012   COLONOSCOPY W/ POLYPECTOMY  08/2007   DILATION AND CURETTAGE OF UTERUS  2002   MOHS SURGERY  2010   PORT-A-CATH REMOVAL N/A 07/18/2017   Procedure: REMOVAL PORT-A-CATH;  Surgeon: Almond Lint, MD;  Location: [facility];  Service: General;  Laterality: N/A;   PORTACATH PLACEMENT Right 07/26/2016   Procedure: INSERTION PORT-A-CATH;  Surgeon: Almond Lint, MD;  Location: [facility];  Service: General;  Laterality: Right;   TOTAL HIP ARTHROPLASTY Left 12/31/2022   Procedure: LEFT TOTAL HIP REPLACEMENT;  Surgeon: Tarry Kos, MD;  Location: [facility];  Service: Orthopedics;  Laterality: Left;  3-C   Social History   Occupational History   Occupation: retired  Tobacco Use   Smoking status: Never    Passive exposure: Never   Smokeless tobacco: Never  Vaping Use   Vaping status: Never Used  Substance and Sexual Activity   Alcohol use: Not Currently    Comment: occasionally   Drug use: No   Sexual activity: Yes    Birth control/protection: Post-menopausal

## 2023-06-12 ENCOUNTER — Other Ambulatory Visit: Payer: Self-pay | Admitting: Physician Assistant

## 2023-06-12 NOTE — Telephone Encounter (Signed)
Pt requesting refill for Requip 0.25 mg. Last OV 11/13/2022.

## 2023-06-20 ENCOUNTER — Other Ambulatory Visit: Payer: Self-pay | Admitting: Family Medicine

## 2023-06-20 DIAGNOSIS — Z1231 Encounter for screening mammogram for malignant neoplasm of breast: Secondary | ICD-10-CM

## 2023-06-25 ENCOUNTER — Ambulatory Visit: Payer: Medicare Other | Admitting: Physician Assistant

## 2023-06-26 ENCOUNTER — Encounter: Payer: Self-pay | Admitting: Family Medicine

## 2023-06-27 ENCOUNTER — Ambulatory Visit (INDEPENDENT_AMBULATORY_CARE_PROVIDER_SITE_OTHER): Payer: Medicare Other | Admitting: Physician Assistant

## 2023-06-27 ENCOUNTER — Encounter: Payer: Self-pay | Admitting: Orthopaedic Surgery

## 2023-06-27 ENCOUNTER — Other Ambulatory Visit (INDEPENDENT_AMBULATORY_CARE_PROVIDER_SITE_OTHER): Payer: Medicare Other

## 2023-06-27 DIAGNOSIS — Z96642 Presence of left artificial hip joint: Secondary | ICD-10-CM | POA: Diagnosis not present

## 2023-06-27 NOTE — Progress Notes (Signed)
 Post-Op Visit Note   Patient: Jamie [name]           Date of Birth: 03-Feb-1956           MRN: [id number] Visit Date: 06/27/2023 PCP: Jamie Jenkins Jansky, MD   Assessment & Plan:  Chief Complaint:  Chief Complaint  Patient presents with   Left Hip - Pain   Visit Diagnoses:  1. Status post total replacement of left hip     Plan: Patient is a pleasant 68 year old female who comes in today almost 6 months status post left total hip replacement 12/31/2022.  She has been doing well.  She notes occasional discomfort to the left hip as well as paresthesias for which she thinks are improving.  Overall, doing well.  Examination of the left hip reveals minimal discomfort with hip flexion.  She is neurovascularly intact distally.  At this point, she will continue to increase activity as tolerated.  Dental prophylaxis reinforced.  Follow-up in 6 months for repeat evaluation and AP pelvis x-rays.  Call with concerns or questions.  Follow-Up Instructions: Return in about 6 months (around 12/25/2023).   Orders:  Orders Placed This Encounter  Procedures   XR Pelvis 1-2 Views   No orders of the defined types were placed in this encounter.   Imaging: XR Pelvis 1-2 Views Result Date: 06/27/2023 Well-seated prosthesis without complication   PMFS History: Patient Active Problem List   Diagnosis Date Noted   Status post total replacement of left hip 12/31/2022   Prediabetes 11/13/2022   Chronic left hip pain 05/08/2022   Family history of coronary arteriosclerosis 10/13/2021   Primary osteoarthritis of left hip 03/23/2021   Family history of osteoporosis 06/12/2018   History of breast cancer 06/12/2018   Advance directive discussed with patient 04/03/2018   Pap smear of cervix declined 04/03/2018   Lumbago 08/16/2017   Basal cell carcinoma 07/17/2017   Vaginismus 07/17/2017   Abnormal TSH 03/28/2017   Diarrhea 11/05/2016   Port catheter in place 10/23/2016   Genetic testing 08/15/2016    Malignant neoplasm of upper-outer quadrant of left breast in female, estrogen receptor positive (HCC) 07/10/2016   Allergic rhinitis 06/13/2016   History of basal cell carcinoma 06/13/2016   Eczema 06/13/2016   Hyperlipemia 06/13/2016   History of colon polyps 06/13/2016   Plantar fascial fibromatosis 06/13/2016   RLS (restless legs syndrome) 06/13/2016   Subclinical hypothyroidism 07/20/2013   Bunion 01/21/2013   Osteopenia 01/20/2013   Past Medical History:  Diagnosis Date   Allergy    Anemia    Hx   Anxiety    Occasionally   Arthritis    feet   Basal cell carcinoma    Breast cancer (HCC) 06/2016   left breast   Colon polyps    Depression    Eczema    Genetic testing 08/15/2016   [name] underwent genetic testing for hereditary cancer syndrome through [employer]'s 43-gene Common Hereditary Cancers Panel. 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 Result report is dated 08/15/2016. Please see genetic counseling documentation from 08/17/2016 for further discussion.   History of radiation therapy 01/02/17-01/31/17   left breast was treated to 42.72 Gy in 16 fractions, left breast boost 10 Gy in 5 fractions   Hyperlipidemia    Personal history of chemotherapy    Personal history of radiation therapy    Pneumonia    2000s   PONV (postoperative  nausea and vomiting)     Family History  Problem Relation Age of Onset   Hyperlipidemia Mother    Ovarian cancer Mother 29   Asthma Mother    Cancer Mother    Hyperlipidemia Father    Cancer Father    Hypertension Father    Stroke Father    Heart disease Father    Depression Father    Heart attack Father    Hyperlipidemia Sister    Arthritis Sister    Osteoporosis Sister    Breast cancer Maternal Aunt 17       recurred at 2   Liver cancer Maternal Uncle 77   Heart attack Maternal Grandmother    Heart attack Paternal Grandmother    Heart disease  Paternal Grandmother    Colon cancer Neg Hx    Colon polyps Neg Hx    Esophageal cancer Neg Hx    Rectal cancer Neg Hx    Stomach cancer Neg Hx     Past Surgical History:  Procedure Laterality Date   BONE SPUR Bilateral 2001 AND 1988   BREAST BIOPSY     BREAST LUMPECTOMY Left    07/2016   BREAST LUMPECTOMY WITH RADIOACTIVE SEED AND SENTINEL LYMPH NODE BIOPSY Left 07/26/2016   Procedure: BREAST LUMPECTOMY WITH RADIOACTIVE SEED AND SENTINEL LYMPH NODE BIOPSY;  Surgeon: Jina Nephew, MD;  Location: [facility];  Service: General;  Laterality: Left;   BUNIONECTOMY Bilateral 02/2012   COLONOSCOPY W/ POLYPECTOMY  08/2007   DILATION AND CURETTAGE OF UTERUS  2002   MOHS SURGERY  2010   PORT-A-CATH REMOVAL N/A 07/18/2017   Procedure: REMOVAL PORT-A-CATH;  Surgeon: Nephew Jina, MD;  Location: [facility];  Service: General;  Laterality: N/A;   PORTACATH PLACEMENT Right 07/26/2016   Procedure: INSERTION PORT-A-CATH;  Surgeon: Jina Nephew, MD;  Location: [facility];  Service: General;  Laterality: Right;   TOTAL HIP ARTHROPLASTY Left 12/31/2022   Procedure: LEFT TOTAL HIP REPLACEMENT;  Surgeon: Jerri Kay HERO, MD;  Location: [facility];  Service: Orthopedics;  Laterality: Left;  3-C   Social History   Occupational History   Occupation: retired  Tobacco Use   Smoking status: Never    Passive exposure: Never   Smokeless tobacco: Never  Vaping Use   Vaping status: Never Used  Substance and Sexual Activity   Alcohol use: Not Currently    Comment: occasionally   Drug use: No   Sexual activity: Yes    Birth control/protection: Post-menopausal

## 2023-06-28 ENCOUNTER — Encounter: Payer: Self-pay | Admitting: Family Medicine

## 2023-06-28 ENCOUNTER — Ambulatory Visit (INDEPENDENT_AMBULATORY_CARE_PROVIDER_SITE_OTHER): Payer: Medicare Other | Admitting: Family Medicine

## 2023-06-28 VITALS — BP 106/74 | HR 68 | Temp 97.9°F | Resp 16 | Ht 65.0 in | Wt 157.4 lb

## 2023-06-28 DIAGNOSIS — K648 Other hemorrhoids: Secondary | ICD-10-CM

## 2023-06-28 MED ORDER — HYDROCORTISONE ACETATE 25 MG RE SUPP: 25.0000 mg | Freq: Two times a day (BID) | RECTAL | 0 refills | Status: DC

## 2023-06-28 NOTE — Progress Notes (Signed)
 Subjective:     Patient ID: Jamie [name], female    DOB: 1955/08/09, 68 y.o.   MRN: [id number]  Chief Complaint  Patient presents with   Hemorrhoids    Possible hemorrhoids, having discomfort and a lot of mucus in her stool that started 2 weeks ago Was using Tux, suppository has helped some    HPI Discussed the use of AI scribe software for clinical note transcription with the patient, who gave verbal consent to proceed.  History of Present Illness   Jamie [name] is a 68 year old female who presents with rectal discomfort and mucus discharge.  She has been experiencing rectal discomfort and mucus discharge for the past two weeks. The mucus is present even without stool, and she feels uncomfortable while sitting. She has been using Tux pads for a week without relief and tried hemorrhoidal suppositories, which initially helped. However, she felt unwell using them during the day and reduced the dosage to nighttime only. She did not use them last night and is currently uncomfortable again. No blood or dark stools, and her stools appear normal brown. She has not experienced this level of mucus before and denies any abdominal pain.  She underwent a colonoscopy approximately a year ago, which revealed internal hemorrhoids. She has been using Metamucil for years to manage constipation, which she experiences if she does not use it. Her bowel movements are sometimes too loose or have a 'plug' followed by loose stool. She has not used suppositories or other treatments prior to this recent episode.  She had a hip replacement surgery on August 12th of the previous year. She experienced significant postoperative spasms and vomiting, which she attributes to a poor reaction to anesthesia. Her potassium levels were not checked postoperatively, but she had elevated white blood cell counts. Her recovery was difficult, but she is now doing well and has returned to the gym.  She is concerned about her  weight, noting that she weighs 157 pounds at a height of 5'5. She has been trying to lose weight since her surgery, aiming to return to the 140s. She has cut out snacks and reduced sugar intake but finds it difficult to lose weight. She exercises regularly and has adjusted her diet to include more protein.       There are no preventive care reminders to display for this patient.  Past Medical History:  Diagnosis Date   Allergy    Anemia    Hx   Anxiety    Occasionally   Arthritis    feet   Basal cell carcinoma    Breast cancer (HCC) 06/2016   left breast   Colon polyps    Depression    Eczema    Genetic testing 08/15/2016   Ms. Gavidia underwent genetic testing for hereditary cancer syndrome through [employer]'s 43-gene Common Hereditary Cancers Panel. Ms. 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 Result report is dated 08/15/2016. Please see genetic counseling documentation from 08/17/2016 for further discussion.   History of radiation therapy 01/02/17-01/31/17   left breast was treated to 42.72 Gy in 16 fractions, left breast boost 10 Gy in 5 fractions   Hyperlipidemia    Personal history of chemotherapy    Personal history of radiation therapy    Pneumonia    2000s   PONV (postoperative nausea and vomiting)     Past Surgical History:  Procedure Laterality Date   BONE  SPUR Bilateral 2001 AND 1988   BREAST BIOPSY     BREAST LUMPECTOMY Left    07/2016   BREAST LUMPECTOMY WITH RADIOACTIVE SEED AND SENTINEL LYMPH NODE BIOPSY Left 07/26/2016   Procedure: BREAST LUMPECTOMY WITH RADIOACTIVE SEED AND SENTINEL LYMPH NODE BIOPSY;  Surgeon: Jina Nephew, MD;  Location: [facility];  Service: General;  Laterality: Left;   BUNIONECTOMY Bilateral 02/2012   COLONOSCOPY W/ POLYPECTOMY  08/2007   DILATION AND CURETTAGE OF UTERUS  2002   MOHS SURGERY  2010   PORT-A-CATH REMOVAL N/A 07/18/2017   Procedure:  REMOVAL PORT-A-CATH;  Surgeon: Nephew Jina, MD;  Location: [facility];  Service: General;  Laterality: N/A;   PORTACATH PLACEMENT Right 07/26/2016   Procedure: INSERTION PORT-A-CATH;  Surgeon: Jina Nephew, MD;  Location: [facility];  Service: General;  Laterality: Right;   TOTAL HIP ARTHROPLASTY Left 12/31/2022   Procedure: LEFT TOTAL HIP REPLACEMENT;  Surgeon: Jerri Kay HERO, MD;  Location: [facility];  Service: Orthopedics;  Laterality: Left;  3-C     Current Outpatient Medications:    BIOTIN PO, Take 1 tablet by mouth daily., Disp: , Rfl:    calcium-vitamin D  (OSCAL WITH D) 500-200 MG-UNIT tablet, Take 1 tablet by mouth 2 (two) times daily., Disp: , Rfl:    cetirizine (ZYRTEC) 10 MG tablet, Take 10 mg by mouth daily as needed (for allergies--Spring/Summer). , Disp: , Rfl:    Cholecalciferol (VITAMIN D3) 1000 units CAPS, Take 1,000 Units by mouth daily. , Disp: , Rfl:    Ginkgo Biloba 40 MG TABS, Take 40 mg by mouth daily., Disp: , Rfl:    hydrocortisone  (ANUSOL -HC) 25 MG suppository, Place 1 suppository (25 mg total) rectally 2 (two) times daily., Disp: 12 suppository, Rfl: 0   Multiple Vitamin (MULTIVITAMIN) tablet, Take 1 tablet by mouth daily., Disp: , Rfl:    Psyllium (METAMUCIL) 28.3 % POWD, Take 1 Scoop by mouth 2 (two) times daily as needed (constipation)., Disp: , Rfl:    Red Yeast Rice Extract (RED YEAST RICE PO), Take 1 capsule by mouth in the morning and at bedtime., Disp: , Rfl:    rOPINIRole  (REQUIP ) 0.25 MG tablet, TAKE 1 TABLET BY MOUTH EVERYDAY AT BEDTIME, Disp: 90 tablet, Rfl: 1   urea  (GORDONS UREA ) 40 % ointment, Apply topically as needed., Disp: 30 g, Rfl: 0  Allergies  Allergen Reactions   Sulfa Antibiotics Nausea And Vomiting   Adhesive [Tape] Rash    Paper Tape is ok    Codeine Nausea Only, Other (See Comments) and Nausea And Vomiting    Dizzy   Sulfamethoxazole Nausea And Vomiting   Wound Dressing Adhesive Rash   ROS neg/noncontributory except as noted  HPI/below      Objective:     BP 106/74   Pulse 68   Temp 97.9 F (36.6 C) (Temporal)   Resp 16   Ht 5' 5 (1.651 m)   Wt 157 lb 6 oz (71.4 kg)   SpO2 97%   BMI 26.19 kg/m  Wt Readings from Last 3 Encounters:  06/28/23 157 lb 6 oz (71.4 kg)  12/31/22 155 lb (70.3 kg)  12/21/22 158 lb 9.6 oz (71.9 kg)    Physical Exam   Gen: WDWN NAD HEENT: NCAT, conjunctiva not injected, sclera nonicteric EXT:  no edema MSK: no gross abnormalities.  NEURO: A&O x3.  CN II-XII intact.  PSYCH: normal mood. Good eye contact  Rectal-declined chaperone.  No external lesions.  +internal hemorroid on  L.  Guiac neg     Assessment & Plan:  Other hemorrhoids  Other orders -     Hydrocortisone  Acetate; Place 1 suppository (25 mg total) rectally 2 (two) times daily.  Dispense: 12 suppository; Refill: 0  Assessment and Plan    Internal Hemorrhoids Discomfort and mucus discharge from the rectum, especially when sitting, have been present for two weeks. A recent colonoscopy confirmed internal hemorrhoids, and examination showed a small internal hemorrhoid without significant external ones. No blood was detected. Symptoms may also be due to inflammation or a minor tear. Treatment options were discussed, including rectal suppositories to shrink hemorrhoids and reduce inflammation, with a note on potential cost and insurance issues. Prescribe rectal suppositories (steroid) twice daily. Add Colace 100 mg daily to manage constipation. Continue Metamucil and increase water intake. Follow up in one week if no improvement, and refer to GI if symptoms persist.  Post-Hip Replacement Status Hip replacement surgery was performed on December 31, 2022, with initial postoperative spasms and vomiting likely due to anesthesia. Current status is well-managed with no ongoing issues. A recent six-month checkup and x-ray were normal. Continue current activities and follow-up as needed.  Weight Management Current weight is  157 lbs with a BMI of 26. There is difficulty losing weight post-surgery despite dietary adjustments and regular exercise. Discussed challenges of weight loss with age and potential dietary modifications. Increase protein intake, especially at lunch, and consider low-calorie, low-sugar protein drinks. Continue regular exercise and monitor weight and dietary changes.  General Health Maintenance The last colonoscopy was in January 2024. No new surgeries or significant health changes reported. Continue routine health screenings as scheduled.        Return if symptoms worsen or fail to improve.  Jenkins CHRISTELLA Carrel, MD

## 2023-06-28 NOTE — Patient Instructions (Signed)
 It was very nice to see you today!  Colace 100mg  daily for stools.   If not improving 1 wk, let me know and refer to GI   PLEASE NOTE:  If you had any lab tests please let us  know if you have not heard back within a few days. You may see your results on MyChart before we have a chance to review them but we will give you a call once they are reviewed by us . If we ordered any referrals today, please let us  know if you have not heard from their office within the next week.   Please try these tips to maintain a healthy lifestyle:  Eat most of your calories during the day when you are active. Eliminate processed foods including packaged sweets (pies, cakes, cookies), reduce intake of potatoes, white bread, white pasta, and white rice. Look for whole grain options, oat flour or almond flour.  Each meal should contain half fruits/vegetables, one quarter protein, and one quarter carbs (no bigger than a computer mouse).  Cut down on sweet beverages. This includes juice, soda, and sweet tea. Also watch fruit intake, though this is a healthier sweet option, it still contains natural sugar! Limit to 3 servings daily.  Drink at least 1 glass of water with each meal and aim for at least 8 glasses per day  Exercise at least 150 minutes every week.

## 2023-07-02 ENCOUNTER — Ambulatory Visit: Payer: Medicare Other | Admitting: Physician Assistant

## 2023-08-16 ENCOUNTER — Ambulatory Visit
Admission: RE | Admit: 2023-08-16 | Discharge: 2023-08-16 | Disposition: A | Discharge status: Home / Self Care | Payer: Medicare Other | Source: Ambulatory Visit | Attending: Family Medicine | Admitting: Family Medicine

## 2023-08-16 DIAGNOSIS — Z1231 Encounter for screening mammogram for malignant neoplasm of breast: Secondary | ICD-10-CM

## 2023-08-19 ENCOUNTER — Encounter: Payer: Self-pay | Admitting: Family Medicine

## 2023-08-20 ENCOUNTER — Other Ambulatory Visit: Payer: Self-pay

## 2023-08-20 ENCOUNTER — Encounter: Payer: Self-pay | Admitting: Orthopaedic Surgery

## 2023-08-20 MED ORDER — AMOXICILLIN 500 MG PO TABS: ORAL_TABLET | ORAL | 2 refills | Status: DC

## 2023-08-20 NOTE — Telephone Encounter (Signed)
Approve.  Thank you.

## 2023-09-25 ENCOUNTER — Ambulatory Visit (INDEPENDENT_AMBULATORY_CARE_PROVIDER_SITE_OTHER): Admitting: Family Medicine

## 2023-09-25 ENCOUNTER — Encounter: Payer: Self-pay | Admitting: Family Medicine

## 2023-09-25 VITALS — BP 101/69 | HR 72 | Temp 97.4°F | Resp 18 | Ht 65.0 in | Wt 159.4 lb

## 2023-09-25 DIAGNOSIS — R0789 Other chest pain: Secondary | ICD-10-CM

## 2023-09-25 DIAGNOSIS — J40 Bronchitis, not specified as acute or chronic: Secondary | ICD-10-CM

## 2023-09-25 MED ORDER — AMOXICILLIN-POT CLAVULANATE 875-125 MG PO TABS: 1.0000 | ORAL_TABLET | Freq: Two times a day (BID) | ORAL | 0 refills | Status: DC

## 2023-09-25 MED ORDER — BENZONATATE 100 MG PO CAPS: 100.0000 mg | ORAL_CAPSULE | Freq: Three times a day (TID) | ORAL | 0 refills | Status: DC | PRN

## 2023-09-25 NOTE — Progress Notes (Signed)
 Subjective:     Patient ID: Jamie [name], female    DOB: Nov 11, 1955, 68 y.o.   MRN: [id number]  Chief Complaint  Patient presents with   Cough    Deep cough and wheezing that started a couple of weeks ago, [facility] Has dark mucus Has been taking mucinex and a decongestant    HPI 5/2 Discussed the use of AI scribe software for clinical note transcription with the patient, who gave verbal consent to proceed.  History of Present Illness Jamie [name] is a 68 year old female who presents with a persistent cough and concerns about possible pneumonia.  She has been experiencing a cough for a couple of weeks, initially seeking care at a [facility] on May 2nd after over a week of symptoms. She has associated congestion and uses saline nasal spray and a neti pot for relief.  She describes a sensation of wheezing on the left side, which was not audible to the clinician at the [facility]. This sensation persisted until yesterday, prompting further evaluation. She completed a five-day course of Augmentin from urgent care but continued to feel a 'rattle' in her chest until today.  Coughing fits are less severe but still bothersome. She used albuterol on Monday and Wednesday without significant improvement, leading her to discontinue its use. She experiences slight shortness of breath when climbing stairs, attributing it to her current illness.  No fever is present, but she has a productive cough with sputum that varies from clear to dark green and thick. On Sunday, she noticed a red tinge in her sputum after forceful coughing, which she then stopped.  She has a history of pneumonia from ten to twelve years ago, contributing to her concern about current symptoms. She uses Mucinex for symptom relief and takes cetirizine for allergies.  In addition to respiratory concerns, she has a history of breast cancer treated with chemotherapy and radiation. She experiences  occasional excruciating pain in her back and left breast, particularly when straining or reaching, which she attributes to potential scar tissue from radiation. This pain has been occurring for a couple of years. Will be gone for months and then flares again    Health Maintenance Due  Topic Date Due   Medicare Annual Wellness (AWV)  11/19/2023    Past Medical History:  Diagnosis Date   Allergy    Anemia    Hx   Anxiety    Occasionally   Arthritis    feet   Basal cell carcinoma    Breast cancer (HCC) 06/2016   left breast   Colon polyps    Depression    Eczema    Genetic testing 08/15/2016   Ms. Freeze underwent genetic testing for hereditary cancer syndrome through [employer]'s 43-gene Common Hereditary Cancers Panel. Ms. 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 Result report is dated 08/15/2016. Please see genetic counseling documentation from 08/17/2016 for further discussion.   History of radiation therapy 01/02/17-01/31/17   left breast was treated to 42.72 Gy in 16 fractions, left breast boost 10 Gy in 5 fractions   Hyperlipidemia    Personal history of chemotherapy    Personal history of radiation therapy    Pneumonia    2000s   PONV (postoperative nausea and vomiting)     Past Surgical History:  Procedure Laterality Date   BONE SPUR Bilateral 2001 AND 1988   BREAST  BIOPSY     BREAST LUMPECTOMY Left    07/2016   BREAST LUMPECTOMY WITH RADIOACTIVE SEED AND SENTINEL LYMPH NODE BIOPSY Left 07/26/2016   Procedure: BREAST LUMPECTOMY WITH RADIOACTIVE SEED AND SENTINEL LYMPH NODE BIOPSY;  Surgeon: Lockie Rima, MD;  Location: [facility];  Service: General;  Laterality: Left;   BUNIONECTOMY Bilateral 02/2012   COLONOSCOPY W/ POLYPECTOMY  08/2007   DILATION AND CURETTAGE OF UTERUS  2002   MOHS SURGERY  2010   PORT-A-CATH REMOVAL N/A 07/18/2017   Procedure: REMOVAL PORT-A-CATH;  Surgeon:  Lockie Rima, MD;  Location: [facility];  Service: General;  Laterality: N/A;   PORTACATH PLACEMENT Right 07/26/2016   Procedure: INSERTION PORT-A-CATH;  Surgeon: Lockie Rima, MD;  Location: [facility];  Service: General;  Laterality: Right;   TOTAL HIP ARTHROPLASTY Left 12/31/2022   Procedure: LEFT TOTAL HIP REPLACEMENT;  Surgeon: Wes Hamman, MD;  Location: [facility] OR;  Service: Orthopedics;  Laterality: Left;  3-C     Current Outpatient Medications:    albuterol (VENTOLIN HFA) 108 (90 Base) MCG/ACT inhaler, Inhale 2 puffs into the lungs., Disp: , Rfl:    amoxicillin  (AMOXIL ) 500 MG tablet, Take 4 tablets by mouth 1 hour prior to dental procedure., Disp: 8 tablet, Rfl: 2   amoxicillin -clavulanate (AUGMENTIN) 875-125 MG tablet, Take 1 tablet by mouth 2 (two) times daily., Disp: 6 tablet, Rfl: 0   benzonatate  (TESSALON  PERLES) 100 MG capsule, Take 1 capsule (100 mg total) by mouth 3 (three) times daily as needed., Disp: 20 capsule, Rfl: 0   benzonatate  (TESSALON ) 100 MG capsule, Swallow whole one (100mg ) capsule by mouth 3 times a day  as needed.Do not break, chew, dissolve, cut or crush., Disp: , Rfl:    BIOTIN PO, Take 1 tablet by mouth daily., Disp: , Rfl:    calcium-vitamin D  (OSCAL WITH D) 500-200 MG-UNIT tablet, Take 1 tablet by mouth 2 (two) times daily., Disp: , Rfl:    cetirizine (ZYRTEC) 10 MG tablet, Take 10 mg by mouth daily as needed (for allergies--Spring/Summer). , Disp: , Rfl:    Cholecalciferol (VITAMIN D3) 1000 units CAPS, Take 1,000 Units by mouth daily. , Disp: , Rfl:    Ginkgo Biloba 40 MG TABS, Take 40 mg by mouth daily., Disp: , Rfl:    hydrocortisone  (ANUSOL -HC) 25 MG suppository, Place 1 suppository (25 mg total) rectally 2 (two) times daily., Disp: 12 suppository, Rfl: 0   Multiple Vitamin (MULTIVITAMIN) tablet, Take 1 tablet by mouth daily., Disp: , Rfl:    Psyllium (METAMUCIL) 28.3 % POWD, Take 1 Scoop by mouth 2 (two) times daily as needed (constipation).,  Disp: , Rfl:    Red Yeast Rice Extract (RED YEAST RICE PO), Take 1 capsule by mouth in the morning and at bedtime., Disp: , Rfl:    rOPINIRole  (REQUIP ) 0.25 MG tablet, TAKE 1 TABLET BY MOUTH EVERYDAY AT BEDTIME, Disp: 90 tablet, Rfl: 1   urea  (GORDONS UREA ) 40 % ointment, Apply topically as needed., Disp: 30 g, Rfl: 0  Allergies  Allergen Reactions   Sulfa Antibiotics Nausea And Vomiting   Adhesive [Tape] Rash    Paper Tape is ok    Codeine Nausea Only, Other (See Comments) and Nausea And Vomiting    Dizzy   Sulfamethoxazole Nausea And Vomiting   Wound Dressing Adhesive Rash   ROS neg/noncontributory except as noted HPI/below      Objective:     BP 101/69   Pulse 72  Temp (!) 97.4 F (36.3 C) (Temporal)   Resp 18   Ht 5\' 5"  (1.651 m)   Wt 159 lb 6 oz (72.3 kg)   SpO2 94%   BMI 26.52 kg/m  Wt Readings from Last 3 Encounters:  09/25/23 159 lb 6 oz (72.3 kg)  06/28/23 157 lb 6 oz (71.4 kg)  12/31/22 155 lb (70.3 kg)    Physical Exam   Gen: WDWN NAD HEENT: NCAT, conjunctiva not injected, sclera nonicteric TM WNL B, OP moist, no exudates  NECK:  supple, no thyromegaly, no nodes CARDIAC: RRR, S1S2+, no murmur.  LUNGS: CTAB. No wheezes EXT:  no edema MSK: no gross abnormalities.  NEURO: A&O x3.  CN II-XII intact.  PSYCH: normal mood. Good eye contact  Reviewed UC notes     Assessment & Plan:  Bronchitis  Other chest pain  Other orders -     Amoxicillin -Pot Clavulanate; Take 1 tablet by mouth 2 (two) times daily.  Dispense: 6 tablet; Refill: 0 -     Benzonatate ; Take 1 capsule (100 mg total) by mouth 3 (three) times daily as needed.  Dispense: 20 capsule; Refill: 0  Assessment and Plan Assessment & Plan Cough with sputum production   She has a persistent cough with sputum production for over two weeks, initially treated with five days of Augmentin. Concerns about pneumonia arise due to her past history, although there is no fever. Sputum is sometimes green  and thick, with an occasional red tinge. She experiences a wheezing sensation on the left side, but no wheezing is heard on examination, and her lungs are clear on auscultation. She experiences shortness of breath when climbing stairs. Augmentin did not cause diarrhea, and she is not considered contagious. Prescribe three additional days of Augmentin due to concerns about the initial course being insufficient. Use an albuterol inhaler as needed for severe coughing or rattling sensation. Continue or discontinue Mucinex based on personal preference and effectiveness. Prescribe Tessalon  Perles for cough suppression, to be taken three times a day. Consider using Mucinex DM for additional cough suppression. Avoid non-essential social activities to prevent alarming others and avoid close contact with immunocompromised individuals.  Bronchitis   Suspected due to persistent cough and sputum production, with no fever present. Augmentin was initially prescribed for five days and is now extended for three more days. She uses an albuterol inhaler as needed, although it has not been found effective. Continue three additional days of Augmentin. Use the albuterol inhaler as needed for severe coughing or rattling sensation. Consider using Mucinex DM for additional cough suppression. Prescribe Tessalon  Perles for cough suppression, to be taken three times a day.  Pneumonia   Her history of pneumonia raises concern for current symptoms, but there is no evidence of pneumonia on examination. Her lungs are clear, and no fever is present. She is worried about recurrence due to past experience. Monitor symptoms and contact the clinic if symptoms worsen by Friday morning.  Scar tissue pain post-radiation   She experiences intermittent pain on the left side, likely due to scar tissue from past radiation therapy for breast cancer. Pain is triggered by certain movements and has been present for a couple of years, with no recent  worsening of symptoms. Consider a chest x-ray if symptoms worsen or for reassurance, although she is hesitant due to potential abnormalities from past radiation.  Breast cancer   She has a history of breast cancer with past chemotherapy and radiation. She reports intermittent pain on the  left side, possibly related to scar tissue from radiation. Pain occurs with certain movements and has been present for a couple of years, with no recent worsening of symptoms. Consider a chest x-ray if symptoms worsen or for reassurance, although she is hesitant due to potential abnormalities from past radiation.    Return if symptoms worsen or fail to improve.  Ellsworth Haas, MD

## 2023-09-25 NOTE — Patient Instructions (Addendum)
 Delsym,  mucinex DM,  cough drops

## 2023-11-28 ENCOUNTER — Ambulatory Visit: Payer: Medicare Other

## 2023-11-28 ENCOUNTER — Encounter: Payer: Self-pay | Admitting: Family Medicine

## 2023-11-28 VITALS — Ht 65.0 in | Wt 159.0 lb

## 2023-11-28 DIAGNOSIS — Z0000 Encounter for general adult medical examination without abnormal findings: Secondary | ICD-10-CM | POA: Diagnosis not present

## 2023-11-28 NOTE — Patient Instructions (Signed)
 [name] , Thank you for taking time out of your busy schedule to complete your Annual Wellness Visit with me. I enjoyed our conversation and look forward to speaking with you again next year. I, as well as your care team,  appreciate your ongoing commitment to your health goals. Please review the following plan we discussed and let me know if I can assist you in the future. Your Game plan/ To Do List    Referrals: If you haven't heard from the office you've been referred to, please reach out to them at the phone provided.   Follow up Visits: Next Medicare AWV with our clinical staff: 12/07/24   Have you seen your provider in the last 6 months (3 months if uncontrolled diabetes)? Yes Next Office Visit with your provider: 12/25/23  Clinician Recommendations:  Aim for 30 minutes of exercise or brisk walking, 6-8 glasses of water, and 5 servings of fruits and vegetables each day.       This is a list of the screening recommended for you and due dates:  Health Maintenance  Topic Date Due   COVID-19 Vaccine (5 - 2024-25 season) 01/20/2023   Medicare Annual Wellness Visit  11/19/2023   Flu Shot  12/20/2023   Mammogram  08/15/2025   Colon Cancer Screening  05/31/2027   DTaP/Tdap/Td vaccine (5 - Td or Tdap) 03/24/2031   Pneumococcal Vaccine for age over 41  Completed   DEXA scan (bone density measurement)  Completed   Hepatitis C Screening  Completed   Zoster (Shingles) Vaccine  Completed   Hepatitis B Vaccine  Aged Out   HPV Vaccine  Aged Out   Meningitis B Vaccine  Aged Out    Advanced directives: (Copy Requested) Please bring a copy of your health care power of attorney and living will to the office to be added to your chart at your convenience. You can mail to [facility] [address] [place], [place] [address] or email to [email or web address] Advance Care Planning is important because it:  [x]  Makes sure you receive the medical care that is consistent with your  values, goals, and preferences  [x]  It provides guidance to your family and loved ones and reduces their decisional burden about whether or not they are making the right decisions based on your wishes.  Follow the link provided in your after visit summary or read over the paperwork we have mailed to you to help you started getting your Advance Directives in place. If you need assistance in completing these, please reach out to us  so that we can help you!  See attachments for Preventive Care and Fall Prevention Tips.

## 2023-11-28 NOTE — Progress Notes (Signed)
 Subjective:   Jamie [name] is a 67 y.o. who presents for a Medicare Wellness preventive visit.  As a reminder, Annual Wellness Visits don't include a physical exam, and some assessments may be limited, especially if this visit is performed virtually. We may recommend an in-person follow-up visit with your provider if needed.  Visit Complete: Virtual I connected with  Jamie [name] on 11/28/23 by a audio enabled telemedicine application and verified that I am speaking with the correct person using two identifiers.  Patient Location: Home  Provider Location: Office/Clinic  I discussed the limitations of evaluation and management by telemedicine. The patient expressed understanding and agreed to proceed.  Vital Signs: Because this visit was a virtual/telehealth visit, some criteria may be missing or patient reported. Any vitals not documented were not able to be obtained and vitals that have been documented are patient reported.  VideoDeclined- This patient declined Librarian, academic. Therefore the visit was completed with audio only.  Persons Participating in Visit: Patient.  AWV Questionnaire: Yes: Patient Medicare AWV questionnaire was completed by the patient on 11/24/23; I have confirmed that all information answered by patient is correct and no changes since this date.  Cardiac Risk Factors include: advanced age (>15men, >20 women);dyslipidemia     Objective:    Today's Vitals   11/28/23 1313  Weight: 159 lb (72.1 kg)  Height: 5' 5 (1.651 m)   Body mass index is 26.46 kg/m.     11/28/2023    1:16 PM 12/21/2022    1:23 PM 11/19/2022    3:10 PM 10/30/2021    1:16 PM 08/29/2021    3:38 PM 04/24/2021    1:45 PM 02/19/2020   10:21 AM  Advanced Directives  Does Patient Have a Medical Advance Directive? Yes Yes Yes Yes Yes Yes Yes  Type of Estate agent of [place];Living will Healthcare Power of [place];Living will Healthcare  Power of [place];Living will Healthcare Power of [employer] of [place];Living will Living will;Healthcare Power of Attorney  Does patient want to make changes to medical advance directive?       No - Patient declined  Copy of Healthcare Power of Attorney in Chart? No - copy requested No - copy requested No - copy requested No - copy requested   No - copy requested    Current Medications (verified) Outpatient Encounter Medications as of 11/28/2023  Medication Sig   BIOTIN PO Take 1 tablet by mouth daily.   calcium-vitamin D  (OSCAL WITH D) 500-200 MG-UNIT tablet Take 1 tablet by mouth 2 (two) times daily.   cetirizine (ZYRTEC) 10 MG tablet Take 10 mg by mouth daily as needed (for allergies--Spring/Summer).    Cholecalciferol (VITAMIN D3) 1000 units CAPS Take 1,000 Units by mouth daily.    Ginkgo Biloba 40 MG TABS Take 40 mg by mouth daily.   Multiple Vitamin (MULTIVITAMIN) tablet Take 1 tablet by mouth daily.   Psyllium (METAMUCIL) 28.3 % POWD Take 1 Scoop by mouth 2 (two) times daily as needed (constipation).   Red Yeast Rice Extract (RED YEAST RICE PO) Take 1 capsule by mouth in the morning and at bedtime.   rOPINIRole  (REQUIP ) 0.25 MG tablet TAKE 1 TABLET BY MOUTH EVERYDAY AT BEDTIME   urea  (GORDONS UREA ) 40 % ointment Apply topically as needed.   albuterol (VENTOLIN HFA) 108 (90 Base) MCG/ACT inhaler Inhale 2 puffs into the lungs. (Patient not taking: Reported on 11/28/2023)   [DISCONTINUED] amoxicillin  (AMOXIL ) 500  MG tablet Take 4 tablets by mouth 1 hour prior to dental procedure.   [DISCONTINUED] amoxicillin -clavulanate (AUGMENTIN ) 875-125 MG tablet Take 1 tablet by mouth 2 (two) times daily.   [DISCONTINUED] benzonatate  (TESSALON  PERLES) 100 MG capsule Take 1 capsule (100 mg total) by mouth 3 (three) times daily as needed.   [DISCONTINUED] hydrocortisone  (ANUSOL -HC) 25 MG suppository Place 1 suppository (25 mg total) rectally 2 (two) times daily.   No  facility-administered encounter medications on file as of 11/28/2023.    Allergies (verified) Sulfa antibiotics, Adhesive [tape], Codeine, Sulfamethoxazole, and Wound dressing adhesive   History: Past Medical History:  Diagnosis Date   Allergy    Anemia    Hx   Anxiety    Occasionally   Arthritis    feet   Basal cell carcinoma    Breast cancer (HCC) 06/2016   left breast   Colon polyps    Depression    Eczema    Genetic testing 08/15/2016   Ms. Layton underwent genetic testing for hereditary cancer syndrome through [facility]'s 43-gene Common Hereditary Cancers Panel. Ms. 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 Result report is dated 08/15/2016. Please see genetic counseling documentation from 08/17/2016 for further discussion.   History of radiation therapy 01/02/17-01/31/17   left breast was treated to 42.72 Gy in 16 fractions, left breast boost 10 Gy in 5 fractions   Hyperlipidemia    Personal history of chemotherapy    Personal history of radiation therapy    Pneumonia    2000s   PONV (postoperative nausea and vomiting)    Past Surgical History:  Procedure Laterality Date   BONE SPUR Bilateral 2001 AND 1988   BREAST BIOPSY     BREAST LUMPECTOMY Left    07/2016   BREAST LUMPECTOMY WITH RADIOACTIVE SEED AND SENTINEL LYMPH NODE BIOPSY Left 07/26/2016   Procedure: BREAST LUMPECTOMY WITH RADIOACTIVE SEED AND SENTINEL LYMPH NODE BIOPSY;  Surgeon: Jina Nephew, MD;  Location: [facility];  Service: General;  Laterality: Left;   BUNIONECTOMY Bilateral 02/2012   COLONOSCOPY W/ POLYPECTOMY  08/2007   DILATION AND CURETTAGE OF UTERUS  2002   MOHS SURGERY  2010   PORT-A-CATH REMOVAL N/A 07/18/2017   Procedure: REMOVAL PORT-A-CATH;  Surgeon: Nephew Jina, MD;  Location: [facility];  Service: General;  Laterality: N/A;   PORTACATH PLACEMENT Right 07/26/2016   Procedure: INSERTION PORT-A-CATH;  Surgeon:  Jina Nephew, MD;  Location: [facility];  Service: General;  Laterality: Right;   TOTAL HIP ARTHROPLASTY Left 12/31/2022   Procedure: LEFT TOTAL HIP REPLACEMENT;  Surgeon: Jerri Kay HERO, MD;  Location: [facility];  Service: Orthopedics;  Laterality: Left;  3-C   Family History  Problem Relation Age of Onset   Hyperlipidemia Mother    Ovarian cancer Mother 77   Asthma Mother    Cancer Mother    Hyperlipidemia Father    Cancer Father    Hypertension Father    Stroke Father    Heart disease Father    Depression Father    Heart attack Father    Hyperlipidemia Sister    Arthritis Sister    Osteoporosis Sister    Breast cancer Maternal Aunt 60       recurred at 43   Liver cancer Maternal Uncle 77   Heart attack Maternal Grandmother    Heart attack Paternal Grandmother    Heart disease Paternal Grandmother    Colon  cancer Neg Hx    Colon polyps Neg Hx    Esophageal cancer Neg Hx    Rectal cancer Neg Hx    Stomach cancer Neg Hx    Social History   Socioeconomic History   Marital status: Married    Spouse name: Not on file   Number of children: 2   Years of education: Not on file   Highest education level: Associate degree: academic program  Occupational History   Occupation: retired  Tobacco Use   Smoking status: Never    Passive exposure: Never   Smokeless tobacco: Never  Vaping Use   Vaping status: Never Used  Substance and Sexual Activity   Alcohol use: Not Currently    Comment: occasionally   Drug use: No   Sexual activity: Yes    Birth control/protection: Post-menopausal  Other Topics Concern   Not on file  Social History Narrative   2 adopted children.  4 grands   Retired Arts administrator   Social Drivers of Corporate investment banker Strain: Low Risk  (11/24/2023)   Overall Financial Resource Strain (CARDIA)    Difficulty of Paying Living Expenses: Not hard at all  Food Insecurity: No Food Insecurity (11/24/2023)   Hunger Vital Sign     Worried About Running Out of Food in the Last Year: Never true    Ran Out of Food in the Last Year: Never true  Transportation Needs: No Transportation Needs (11/24/2023)   PRAPARE - Administrator, Civil Service (Medical): No    Lack of Transportation (Non-Medical): No  Physical Activity: Sufficiently Active (11/24/2023)   Exercise Vital Sign    Days of Exercise per Week: 4 days    Minutes of Exercise per Session: 60 min  Stress: No Stress Concern Present (11/24/2023)   [employer] of Occupational Health - Occupational Stress Questionnaire    Feeling of Stress: Only a little  Social Connections: Socially Integrated (11/24/2023)   Social Connection and Isolation Panel    Frequency of Communication with Friends and Family: More than three times a week    Frequency of Social Gatherings with Friends and Family: More than three times a week    Attends Religious Services: More than 4 times per year    Active Member of [name] or Organizations: Yes    Attends Engineer, structural: More than 4 times per year    Marital Status: Married    Tobacco Counseling Counseling given: Not Answered    Clinical Intake:  Pre-visit preparation completed: Yes  Pain : No/denies pain     BMI - recorded: 24.46 Nutritional Status: BMI of 19-24  Normal Nutritional Risks: None Diabetes: No  Lab Results  Component Value Date   HGBA1C 5.7 11/13/2022   HGBA1C 5.7 10/17/2021     How often do you need to have someone help you when you read instructions, pamphlets, or other written materials from your doctor or pharmacy?: 1 - Never  Interpreter Needed?: No  Information entered by :: Ellouise Haws, LPN   Activities of Daily Living     11/24/2023    2:55 PM 12/21/2022    1:25 PM  In your present state of health, do you have any difficulty performing the following activities:  Hearing? 0   Vision? 0   Difficulty concentrating or making decisions? 0   Walking or climbing stairs?  0   Dressing or bathing? 0   Doing errands, shopping? 0 0  Preparing Food  and eating ? N   Using the Toilet? N   In the past six months, have you accidently leaked urine? N   Do you have problems with loss of bowel control? N   Managing your Medications? N   Managing your Finances? N   Housekeeping or managing your Housekeeping? N     Patient Care Team: Wendolyn Jenkins Jansky, MD as PCP - General (Family Medicine) Aron Shoulders, MD as Consulting Physician (General Surgery) Shannon Agent, MD as Consulting Physician (Radiation Oncology) Jacques Lenis, MD as Referring Physician Armbruster, Elspeth SQUIBB, MD as Consulting Physician (Gastroenterology) Crawford Morna Pickle, NP as Nurse Practitioner (Hematology and Oncology) Loretha Ash, MD as Consulting Physician (Hematology and Oncology)  I have updated your Care Teams any recent Medical Services you may have received from other providers in the past year.     Assessment:   This is a routine wellness examination for Jamie [name].  Hearing/Vision screen Hearing Screening - Comments:: Pt denies any hearing issues  Vision Screening - Comments:: Wears rx glasses - up to date with routine eye exams with Dr Thom Hamilton [facility]     Goals Addressed             This Visit's Progress    Patient Stated       Keep exercising        Depression Screen     11/28/2023    1:18 PM 11/19/2022    3:09 PM 09/10/2022   11:30 AM 10/30/2021    1:14 PM 10/13/2021   10:29 AM 09/17/2019    7:44 AM 09/08/2019    1:21 PM  PHQ 2/9 Scores  PHQ - 2 Score 0 0 0 0 0 0 0  PHQ- 9 Score   1  0 1 1    Fall Risk     11/24/2023    2:55 PM 11/15/2022    2:17 PM 09/10/2022   11:30 AM 10/30/2021    1:18 PM 10/13/2021   10:29 AM  Fall Risk   Falls in the past year? 0 0 0 0 0  Number falls in past yr: 0  0 0 0  Injury with Fall? 0 0 0 0 0  Risk for fall due to : No Fall Risks Impaired vision No Fall Risks Impaired vision No Fall Risks  Follow up  Falls prevention discussed Falls prevention discussed Falls evaluation completed Falls prevention discussed  Falls evaluation completed      Data saved with a previous flowsheet row definition    MEDICARE RISK AT HOME:  Medicare Risk at Home Any stairs in or around the home?: (Patient-Rptd) Yes If so, are there any without handrails?: (Patient-Rptd) No Home free of loose throw rugs in walkways, pet beds, electrical cords, etc?: (Patient-Rptd) Yes Adequate lighting in your home to reduce risk of falls?: (Patient-Rptd) Yes Life alert?: (Patient-Rptd) No Use of a cane, walker or w/c?: (Patient-Rptd) No Grab bars in the bathroom?: (Patient-Rptd) Yes Shower chair or bench in shower?: (Patient-Rptd) Yes Elevated toilet seat or a handicapped toilet?: (Patient-Rptd) No  TIMED UP AND GO:  Was the test performed?  No  Cognitive Function: 6CIT completed        11/28/2023    1:19 PM 11/19/2022    3:11 PM 10/30/2021    1:20 PM  6CIT Screen  What Year? 0 points 0 points 0 points  What month? 0 points 0 points 0 points  What time? 0 points 0 points 0 points  Count  back from 20 0 points 0 points 0 points  Months in reverse 0 points 0 points 0 points  Repeat phrase 0 points 0 points 0 points  Total Score 0 points 0 points 0 points    Immunizations Immunization History  Administered Date(s) Administered   Fluad Quad(high Dose 65+) 02/04/2020   Influenza Split 03/24/2009, 02/18/2014, 03/08/2015   Influenza,inj,Quad PF,6+ Mos 02/18/2014, 02/13/2017, 01/14/2018, 02/10/2019   Influenza-Unspecified 03/24/2009, 03/08/2015, 02/23/2022, 02/20/2023   Moderna Sars-Covid-2 Vaccination 06/15/2019, 07/13/2019, 03/14/2020   PNEUMOCOCCAL CONJUGATE-20 09/19/2020   Pfizer Covid-19 Vaccine Bivalent Booster 67yrs & up 07/27/2021   Td 05/17/1999   Td (Adult),5 Lf Tetanus Toxid, Preservative Free 05/17/1999   Tdap 10/30/2010, 03/23/2021   Zoster Recombinant(Shingrix ) 05/01/2018, 07/11/2018   Zoster, Live  02/15/2015    Screening Tests Health Maintenance  Topic Date Due   COVID-19 Vaccine (5 - 2024-25 season) 01/20/2023   INFLUENZA VACCINE  12/20/2023   Medicare Annual Wellness (AWV)  11/27/2024   MAMMOGRAM  08/15/2025   Colonoscopy  05/31/2027   DTaP/Tdap/Td (5 - Td or Tdap) 03/24/2031   Pneumococcal Vaccine: 50+ Years  Completed   DEXA SCAN  Completed   Hepatitis C Screening  Completed   Zoster Vaccines- Shingrix   Completed   Hepatitis B Vaccines  Aged Out   HPV VACCINES  Aged Out   Meningococcal B Vaccine  Aged Out    Health Maintenance  Health Maintenance Due  Topic Date Due   COVID-19 Vaccine (5 - 2024-25 season) 01/20/2023   Health Maintenance Items Addressed: See Nurse Notes at the end of this note  Additional Screening:  Vision Screening: Recommended annual ophthalmology exams for early detection of glaucoma and other disorders of the eye. Would you like a referral to an eye doctor? No    Dental Screening: Recommended annual dental exams for proper oral hygiene  Community Resource Referral / Chronic Care Management: CRR required this visit?  No   CCM required this visit?  No   Plan:    I have personally reviewed and noted the following in the patient's chart:   Medical and social history Use of alcohol, tobacco or illicit drugs  Current medications and supplements including opioid prescriptions. Patient is not currently taking opioid prescriptions. Functional ability and status Nutritional status Physical activity Advanced directives List of other physicians Hospitalizations, surgeries, and ER visits in previous 12 months Vitals Screenings to include cognitive, depression, and falls Referrals and appointments  In addition, I have reviewed and discussed with patient certain preventive protocols, quality metrics, and best practice recommendations. A written personalized care plan for preventive services as well as general preventive health  recommendations were provided to patient.   Ellouise VEAR Haws, LPN   2/89/7974   After Visit Summary: (MyChart) Due to this being a telephonic visit, the after visit summary with patients personalized plan was offered to patient via MyChart   Notes: Nothing significant to report at this time.

## 2023-12-06 ENCOUNTER — Other Ambulatory Visit: Payer: Self-pay | Admitting: Family

## 2023-12-12 ENCOUNTER — Encounter: Payer: Self-pay | Admitting: Family Medicine

## 2023-12-13 ENCOUNTER — Other Ambulatory Visit: Payer: Self-pay

## 2023-12-13 MED ORDER — ROPINIROLE HCL 0.25 MG PO TABS: 0.2500 mg | ORAL_TABLET | Freq: Every day | ORAL | 0 refills | Status: DC

## 2023-12-24 ENCOUNTER — Ambulatory Visit (INDEPENDENT_AMBULATORY_CARE_PROVIDER_SITE_OTHER): Payer: Medicare Other | Admitting: Physician Assistant

## 2023-12-24 ENCOUNTER — Other Ambulatory Visit (INDEPENDENT_AMBULATORY_CARE_PROVIDER_SITE_OTHER): Payer: Self-pay

## 2023-12-24 DIAGNOSIS — Z96642 Presence of left artificial hip joint: Secondary | ICD-10-CM

## 2023-12-24 NOTE — Progress Notes (Signed)
 Post-Op Visit Note   Patient: Jamie [name]           Date of Birth: [birth year]           MRN: [id number] Visit Date: 12/24/2023 PCP: Wendolyn Jenkins Jansky, MD   Assessment & Plan:  Chief Complaint:  Chief Complaint  Patient presents with   Left Hip - Follow-up   Visit Diagnoses:  1. History of left hip replacement     Plan: Patient is a very pleasant 68 year old female who comes in today 1 year status post left total hip replacement 12/31/2022.  Doing great in regards to her left hip.  She is having some occasional left buttock pain for which she had during the initial postoperative period.  She has been doing Pilates.  She does tell me about an area to the distal aspect of her incision which recently popped up.  No pain just concerned with the appearance.  Examination of her left hip reveals painless hip flexion logroll.  She does have what looks like superficial abrasion to the distal aspect of her wound.  No compromise to the incision itself.  No tenderness.  She is neurovascular intact distally.  At this point, she will continue to advance with activity as tolerated.  Dental prophylaxis reinforced.  If the abrasion seems to worsen, she will let us  know.  Otherwise, follow-up in 1 year for repeat evaluation AP pelvis x-rays.  Call with concerns or questions.  Follow-Up Instructions: Return in about 1 year (around 12/23/2024).   Orders:  Orders Placed This Encounter  Procedures   XR HIP UNILAT W OR W/O PELVIS 1V LEFT   No orders of the defined types were placed in this encounter.   Imaging: No results found.  PMFS History: Patient Active Problem List   Diagnosis Date Noted   Status post total replacement of left hip 12/31/2022   Prediabetes 11/13/2022   Chronic left hip pain 05/08/2022   Family history of coronary arteriosclerosis 10/13/2021   Primary osteoarthritis of left hip 03/23/2021   Family history of osteoporosis 06/12/2018   History of breast cancer 06/12/2018    Advance directive discussed with patient 04/03/2018   Pap smear of cervix declined 04/03/2018   Lumbago 08/16/2017   Basal cell carcinoma 07/17/2017   Vaginismus 07/17/2017   Abnormal TSH 03/28/2017   Diarrhea 11/05/2016   Port catheter in place 10/23/2016   Genetic testing 08/15/2016   Malignant neoplasm of upper-outer quadrant of left breast in female, estrogen receptor positive (HCC) 07/10/2016   Allergic rhinitis 06/13/2016   History of basal cell carcinoma 06/13/2016   Eczema 06/13/2016   Hyperlipemia 06/13/2016   History of colon polyps 06/13/2016   Plantar fascial fibromatosis 06/13/2016   RLS (restless legs syndrome) 06/13/2016   Subclinical hypothyroidism 07/20/2013   Bunion 01/21/2013   Osteopenia 01/20/2013   Past Medical History:  Diagnosis Date   Allergy    Anemia    Hx   Anxiety    Occasionally   Arthritis    feet   Basal cell carcinoma    Breast cancer (HCC) 06/2016   left breast   Colon polyps    Depression    Eczema    Genetic testing 08/15/2016   [name] underwent genetic testing for hereditary cancer syndrome through [facility]'s 43-gene Common Hereditary Cancers Panel. 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 Result report is dated 08/15/2016. Please see genetic counseling  documentation from 08/17/2016 for further discussion.   History of radiation therapy 01/02/17-01/31/17   left breast was treated to 42.72 Gy in 16 fractions, left breast boost 10 Gy in 5 fractions   Hyperlipidemia    Personal history of chemotherapy    Personal history of radiation therapy    Pneumonia    2000s   PONV (postoperative nausea and vomiting)     Family History  Problem Relation Age of Onset   Hyperlipidemia Mother    Ovarian cancer Mother 70   Asthma Mother    Cancer Mother    Hyperlipidemia Father    Cancer Father    Hypertension Father    Stroke Father    Heart disease Father     Depression Father    Heart attack Father    Hyperlipidemia Sister    Arthritis Sister    Osteoporosis Sister    Breast cancer Maternal Aunt 33       recurred at 33   Liver cancer Maternal Uncle 77   Heart attack Maternal Grandmother    Heart attack Paternal Grandmother    Heart disease Paternal Grandmother    Colon cancer Neg Hx    Colon polyps Neg Hx    Esophageal cancer Neg Hx    Rectal cancer Neg Hx    Stomach cancer Neg Hx     Past Surgical History:  Procedure Laterality Date   BONE SPUR Bilateral 2001 AND 1988   BREAST BIOPSY     BREAST LUMPECTOMY Left    07/2016   BREAST LUMPECTOMY WITH RADIOACTIVE SEED AND SENTINEL LYMPH NODE BIOPSY Left 07/26/2016   Procedure: BREAST LUMPECTOMY WITH RADIOACTIVE SEED AND SENTINEL LYMPH NODE BIOPSY;  Surgeon: Jina Nephew, MD;  Location: [facility];  Service: General;  Laterality: Left;   BUNIONECTOMY Bilateral 02/2012   COLONOSCOPY W/ POLYPECTOMY  08/2007   DILATION AND CURETTAGE OF UTERUS  2002   MOHS SURGERY  2010   PORT-A-CATH REMOVAL N/A 07/18/2017   Procedure: REMOVAL PORT-A-CATH;  Surgeon: Nephew Jina, MD;  Location: [facility];  Service: General;  Laterality: N/A;   PORTACATH PLACEMENT Right 07/26/2016   Procedure: INSERTION PORT-A-CATH;  Surgeon: Jina Nephew, MD;  Location: [facility];  Service: General;  Laterality: Right;   TOTAL HIP ARTHROPLASTY Left 12/31/2022   Procedure: LEFT TOTAL HIP REPLACEMENT;  Surgeon: Jerri Kay HERO, MD;  Location: [facility];  Service: Orthopedics;  Laterality: Left;  3-C   Social History   Occupational History   Occupation: retired  Tobacco Use   Smoking status: Never    Passive exposure: Never   Smokeless tobacco: Never  Vaping Use   Vaping status: Never Used  Substance and Sexual Activity   Alcohol use: Not Currently    Comment: occasionally   Drug use: No   Sexual activity: Yes    Birth control/protection: Post-menopausal

## 2023-12-25 ENCOUNTER — Ambulatory Visit (INDEPENDENT_AMBULATORY_CARE_PROVIDER_SITE_OTHER): Admitting: Family Medicine

## 2023-12-25 ENCOUNTER — Ambulatory Visit: Payer: Self-pay | Admitting: Family Medicine

## 2023-12-25 ENCOUNTER — Encounter: Payer: Self-pay | Admitting: Family Medicine

## 2023-12-25 VITALS — BP 105/68 | HR 57 | Temp 97.5°F | Resp 16 | Ht 65.0 in | Wt 155.0 lb

## 2023-12-25 DIAGNOSIS — E7800 Pure hypercholesterolemia, unspecified: Secondary | ICD-10-CM | POA: Diagnosis not present

## 2023-12-25 DIAGNOSIS — R7989 Other specified abnormal findings of blood chemistry: Secondary | ICD-10-CM

## 2023-12-25 DIAGNOSIS — G2581 Restless legs syndrome: Secondary | ICD-10-CM

## 2023-12-25 DIAGNOSIS — R7303 Prediabetes: Secondary | ICD-10-CM | POA: Diagnosis not present

## 2023-12-25 DIAGNOSIS — Z853 Personal history of malignant neoplasm of breast: Secondary | ICD-10-CM

## 2023-12-25 DIAGNOSIS — E038 Other specified hypothyroidism: Secondary | ICD-10-CM | POA: Diagnosis not present

## 2023-12-25 LAB — CBC WITH DIFFERENTIAL/PLATELET
Basophils Absolute: 0.1 K/uL (ref 0.0–0.1)
Basophils Relative: 1 % (ref 0.0–3.0)
Eosinophils Absolute: 0.2 K/uL (ref 0.0–0.7)
Eosinophils Relative: 4 % (ref 0.0–5.0)
HCT: 40.8 % (ref 36.0–46.0)
Hemoglobin: 13.5 g/dL (ref 12.0–15.0)
Lymphocytes Relative: 28.8 % (ref 12.0–46.0)
Lymphs Abs: 1.7 K/uL (ref 0.7–4.0)
MCHC: 33 g/dL (ref 30.0–36.0)
MCV: 90.3 fl (ref 78.0–100.0)
Monocytes Absolute: 0.5 K/uL (ref 0.1–1.0)
Monocytes Relative: 7.6 % (ref 3.0–12.0)
Neutro Abs: 3.5 K/uL (ref 1.4–7.7)
Neutrophils Relative %: 58.6 % (ref 43.0–77.0)
Platelets: 355 K/uL (ref 150.0–400.0)
RBC: 4.51 Mil/uL (ref 3.87–5.11)
RDW: 14.1 % (ref 11.5–15.5)
WBC: 6.1 K/uL (ref 4.0–10.5)

## 2023-12-25 LAB — COMPREHENSIVE METABOLIC PANEL WITH GFR
ALT: 13 U/L (ref 0–35)
AST: 20 U/L (ref 0–37)
Albumin: 4.4 g/dL (ref 3.5–5.2)
Alkaline Phosphatase: 43 U/L (ref 39–117)
BUN: 13 mg/dL (ref 6–23)
CO2: 28 meq/L (ref 19–32)
Calcium: 9.4 mg/dL (ref 8.4–10.5)
Chloride: 101 meq/L (ref 96–112)
Creatinine, Ser: 0.75 mg/dL (ref 0.40–1.20)
GFR: 81.48 mL/min (ref >60.00)
Glucose, Bld: 95 mg/dL (ref 70–99)
Potassium: 4 meq/L (ref 3.5–5.1)
Sodium: 136 meq/L (ref 135–145)
Total Bilirubin: 0.4 mg/dL (ref 0.2–1.2)
Total Protein: 7.3 g/dL (ref 6.0–8.3)

## 2023-12-25 LAB — LIPID PANEL
Cholesterol: 249 mg/dL — ABNORMAL HIGH (ref 0–200)
HDL: 59.4 mg/dL (ref >39.00)
LDL Cholesterol: 168 mg/dL — ABNORMAL HIGH (ref 0–99)
NonHDL: 189.65
Total CHOL/HDL Ratio: 4
Triglycerides: 106 mg/dL (ref 0.0–149.0)
VLDL: 21.2 mg/dL (ref 0.0–40.0)

## 2023-12-25 LAB — TSH: TSH: 4 u[IU]/mL (ref 0.35–5.50)

## 2023-12-25 LAB — HEMOGLOBIN A1C: Hgb A1c MFr Bld: 5.9 % (ref 4.6–6.5)

## 2023-12-25 MED ORDER — ROPINIROLE HCL 0.25 MG PO TABS: 0.2500 mg | ORAL_TABLET | Freq: Every day | ORAL | 3 refills | Status: DC

## 2023-12-25 NOTE — Patient Instructions (Signed)
 It was very nice to see you today!  Keep up the great work!   PLEASE NOTE:  If you had any lab tests please let us know if you have not heard back within a few days. You may see your results on MyChart before we have a chance to review them but we will give you a call once they are reviewed by [place]. If we ordered any referrals today, please let us know if you have not heard from their office within the next week.   Please try these tips to maintain a healthy lifestyle:  Eat most of your calories during the day when you are active. Eliminate processed foods including packaged sweets (pies, cakes, cookies), reduce intake of potatoes, white bread, white pasta, and white rice. Look for whole grain options, oat flour or almond flour.  Each meal should contain half fruits/vegetables, one quarter protein, and one quarter carbs (no bigger than a computer mouse).  Cut down on sweet beverages. This includes juice, soda, and sweet tea. Also watch fruit intake, though this is a healthier sweet option, it still contains natural sugar! Limit to 3 servings daily.  Drink at least 1 glass of water with each meal and aim for at least 8 glasses per day  Exercise at least 150 minutes every week.

## 2023-12-25 NOTE — Progress Notes (Signed)
 Subjective:     Patient ID: Jamie [name], female    DOB: Aug 23, 1955, 68 y.o.   MRN: [id number]  Chief Complaint  Patient presents with   Annual Exam    Annual exam Fasting     HPI Discussed the use of AI scribe software for clinical note transcription with the patient, who gave verbal consent to proceed.  History of Present Illness Jamie [name] is a 68 year old female with restless legs syndrome who presents for follow-up on her medication regimen.  She has been managing her restless legs syndrome with ropinirole  0.25 mg. Recently, she attempted to discontinue the medication in favor of magnesium  supplements based on a friend's recommendation, but found the magnesium  alone ineffective. She has since resumed ropinirole  at the lowest dose and added magnesium  supplements, which has been effective in managing her symptoms, allowing her to sleep well.  She maintains an active lifestyle, attending a senior fitness class four times a week, which includes weights, bar exercises, and yoga. She has resumed yoga post-hip replacement and finds it beneficial for stretching and balance. She believes that balancing exercises may help with bone density and is pleased with her current level of physical activity.  She is aware of her family history of type 2 diabetes. She was surprised by a previous indication of prediabetes, as she does not consume many sweets, but has since re-evaluated her diet to reduce added sugars.  She reports a lifelong challenge with constipation, which she manages by drinking plenty of water and consuming pineapple. She notes that excessive peanut butter intake is not a viable solution for her.  HLD-working on diet/exercie  Abn thyroid  tests in past-no meds  No new surgeries since her hip replacement, headaches, dizziness, vision changes, respiratory symptoms, gastrointestinal issues aside from constipation, urinary symptoms, or significant musculoskeletal pain.  Occasional joint pain, attributed to aging.    Health Maintenance Due  Topic Date Due   INFLUENZA VACCINE  12/20/2023    Past Medical History:  Diagnosis Date   Allergy    Anemia    Hx   Anxiety    Occasionally   Arthritis    feet   Basal cell carcinoma    Breast cancer (HCC) 06/2016   left breast   Colon polyps    Depression    Eczema    Genetic testing 08/15/2016   Ms. Dickman underwent genetic testing for hereditary cancer syndrome through [employer]'s 43-gene Common Hereditary Cancers Panel. Ms. 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 Result report is dated 08/15/2016. Please see genetic counseling documentation from 08/17/2016 for further discussion.   History of radiation therapy 01/02/17-01/31/17   left breast was treated to 42.72 Gy in 16 fractions, left breast boost 10 Gy in 5 fractions   Hyperlipidemia    Personal history of chemotherapy    Personal history of radiation therapy    Pneumonia    2000s   PONV (postoperative nausea and vomiting)     Past Surgical History:  Procedure Laterality Date   BONE SPUR Bilateral 2001 AND 1988   BREAST BIOPSY     BREAST LUMPECTOMY Left    07/2016   BREAST LUMPECTOMY WITH RADIOACTIVE SEED AND SENTINEL LYMPH NODE BIOPSY Left 07/26/2016   Procedure: BREAST LUMPECTOMY WITH RADIOACTIVE SEED AND SENTINEL LYMPH NODE BIOPSY;  Surgeon: Jina Nephew, MD;  Location: [facility];  Service: General;  Laterality: Left;   BUNIONECTOMY  Bilateral 02/2012   COLONOSCOPY W/ POLYPECTOMY  08/2007   DILATION AND CURETTAGE OF UTERUS  2002   MOHS SURGERY  2010   PORT-A-CATH REMOVAL N/A 07/18/2017   Procedure: REMOVAL PORT-A-CATH;  Surgeon: Aron Shoulders, MD;  Location: [facility];  Service: General;  Laterality: N/A;   PORTACATH PLACEMENT Right 07/26/2016   Procedure: INSERTION PORT-A-CATH;  Surgeon: Shoulders Aron, MD;  Location:  SUR[facility]ERY CENTER;  Service:  General;  Laterality: Right;   TOTAL HIP ARTHROPLASTY Left 12/31/2022   Procedure: LEFT TOTAL HIP REPLACEMENT;  Surgeon: Jerri Kay HERO, MD;  Location: [facility];  Service: Orthopedics;  Laterality: Left;  3-C     Current Outpatient Medications:    albuterol (VENTOLIN HFA) 108 (90 Base) MCG/ACT inhaler, Inhale 2 puffs into the lungs., Disp: , Rfl:    BIOTIN PO, Take 1 tablet by mouth daily., Disp: , Rfl:    calcium-vitamin D  (OSCAL WITH D) 500-200 MG-UNIT tablet, Take 1 tablet by mouth 2 (two) times daily., Disp: , Rfl:    cetirizine (ZYRTEC) 10 MG tablet, Take 10 mg by mouth daily as needed (for allergies--Spring/Summer). , Disp: , Rfl:    Cholecalciferol (VITAMIN D3) 1000 units CAPS, Take 1,000 Units by mouth daily. , Disp: , Rfl:    Ginkgo Biloba 40 MG TABS, Take 40 mg by mouth daily., Disp: , Rfl:    MAGNESIUM  PO, Take 200 mg by mouth. High Absorption Magnesium  Lysinate Glycinate, Disp: , Rfl:    Multiple Vitamin (MULTIVITAMIN) tablet, Take 1 tablet by mouth daily., Disp: , Rfl:    Probiotic CHEW, 2 gummys daily, Disp: , Rfl:    Psyllium (METAMUCIL) 28.3 % POWD, Take 1 Scoop by mouth 2 (two) times daily as needed (constipation)., Disp: , Rfl:    Red Yeast Rice Extract (RED YEAST RICE PO), Take 1 capsule by mouth in the morning and at bedtime., Disp: , Rfl:    urea  (GORDONS UREA ) 40 % ointment, Apply topically as needed., Disp: 30 g, Rfl: 0   rOPINIRole  (REQUIP ) 0.25 MG tablet, Take 1 tablet (0.25 mg total) by mouth at bedtime., Disp: 90 tablet, Rfl: 3  Allergies  Allergen Reactions   Sulfa Antibiotics Nausea And Vomiting   Adhesive [Tape] Rash    Paper Tape is ok    Codeine Nausea Only, Other (See Comments) and Nausea And Vomiting    Dizzy   Sulfamethoxazole Nausea And Vomiting   Wound Dressing Adhesive Rash   ROS neg/noncontributory except as noted HPI/below      Objective:     BP 105/68   Pulse (!) 57   Temp (!) 97.5 F (36.4 C) (Temporal)   Resp 16   Ht 5' 5 (1.651 m)    Wt 155 lb (70.3 kg)   SpO2 98%   BMI 25.79 kg/m  Wt Readings from Last 3 Encounters:  12/25/23 155 lb (70.3 kg)  11/28/23 159 lb (72.1 kg)  09/25/23 159 lb 6 oz (72.3 kg)    Physical Exam   Gen: WDWN NAD HEENT: NCAT, conjunctiva not injected, sclera nonicteric NECK:  supple, no thyromegaly, no nodes, no carotid bruits CARDIAC: RRR, S1S2+, no murmur. DP 2+B LUNGS: CTAB. No wheezes ABDOMEN:  BS+, soft, NTND, No HSM, no masses EXT:  no edema MSK: no gross abnormalities.  NEURO: A&O x3.  CN II-XII intact.  PSYCH: normal mood. Good eye contact     Assessment & Plan:  Pure hypercholesterolemia -     CBC with Differential/Platelet -  Comprehensive metabolic panel with GFR -     Lipid panel  Prediabetes -     Comprehensive metabolic panel with GFR -     TSH -     Hemoglobin A1c  RLS (restless legs syndrome) -     CBC with Differential/Platelet -     rOPINIRole  HCl; Take 1 tablet (0.25 mg total) by mouth at bedtime.  Dispense: 90 tablet; Refill: 3  Subclinical hypothyroidism -     TSH  History of breast cancer  Abnormal TSH -     TSH  Assessment and Plan Assessment & Plan Restless legs syndrome   Restless legs syndrome is well-managed with low-dose ropinirole  (0.25 mg) and magnesium  supplements. Previous attempts to discontinue ropinirole  in favor of magnesium  alone were ineffective. The combination therapy effectively manages symptoms, allowing for restful sleep. Continue ropinirole  0.25 mg daily and magnesium  supplements. Adjust ropinirole  dosage if symptoms worsen.  Constipation   Chronic constipation is a lifelong issue. She consumes plenty of water and uses pineapple as a dietary aid, providing some relief. Excessive peanut butter intake is noted but not preferred. Continue dietary management with pineapple and encourage adequate hydration. Adjust dietary intake as needed.  Hyperlipidemia   Cholesterol levels are borderline high. She participates in a senior  fitness program, including weights and yoga, to manage cholesterol levels and overall health. Family history of hyperlipidemia is noted. Continue regular exercise regimen including senior fitness classes and yoga. Monitor cholesterol levels.  Prediabetes   She is at the border of prediabetes, which was unexpected given her dietary habits. She has re-evaluated her diet, focusing on reducing added sugars, and hopes for improved lab results. Family history of type 2 diabetes is noted. Continue dietary modifications to reduce added sugars.   Subclinical hypothyroidism-monitor  Return in about 1 year (around 12/24/2024) for annual physical.  Jenkins CHRISTELLA Carrel, MD

## 2023-12-25 NOTE — Progress Notes (Signed)
 Labs ok except  A1C(3 month average of sugars) is elevated.  This is considered PreDiabetes.  Work on diet-decrease sugars and starches and aim for 30 minutes of exercise 5 days/week to prevent progression to diabetes  Your cholesterol levels are elevated.  Work on low cholesterol and lower carbs/sugars diet and  get exercise to try to lower your cholesterol. - the levels are quite high-may need meds or decide on risk of treating or not.  Also, if some room for improvement on diet, can work on that and sch appt to see me in 6 mo or so to repeat and decide on plan

## 2023-12-27 ENCOUNTER — Telehealth: Payer: Self-pay | Admitting: *Deleted

## 2023-12-27 NOTE — Telephone Encounter (Signed)
 1 year office visit for Left THA completed.

## 2024-01-07 ENCOUNTER — Encounter: Payer: Self-pay | Admitting: Orthopaedic Surgery

## 2024-01-08 NOTE — Telephone Encounter (Signed)
 Yes approve PT.  Thanks.

## 2024-01-09 ENCOUNTER — Other Ambulatory Visit: Payer: Self-pay

## 2024-01-09 DIAGNOSIS — Z96642 Presence of left artificial hip joint: Secondary | ICD-10-CM

## 2024-01-16 ENCOUNTER — Other Ambulatory Visit: Payer: Self-pay

## 2024-01-16 ENCOUNTER — Ambulatory Visit: Attending: Orthopaedic Surgery | Admitting: Rehabilitative and Restorative Service Providers"

## 2024-01-16 DIAGNOSIS — Z96642 Presence of left artificial hip joint: Secondary | ICD-10-CM | POA: Insufficient documentation

## 2024-01-16 DIAGNOSIS — M25552 Pain in left hip: Secondary | ICD-10-CM | POA: Diagnosis present

## 2024-01-16 DIAGNOSIS — R29898 Other symptoms and signs involving the musculoskeletal system: Secondary | ICD-10-CM | POA: Insufficient documentation

## 2024-01-16 DIAGNOSIS — M6281 Muscle weakness (generalized): Secondary | ICD-10-CM | POA: Diagnosis present

## 2024-01-16 NOTE — Therapy (Signed)
 OUTPATIENT PHYSICAL THERAPY LOWER EXTREMITY EVALUATION   Patient Name: Jamie [name] MRN: [id number] DOB:02-Jan-1956, 68 y.o., female Today's Date: 01/16/2024  END OF SESSION:  PT End of Session - 01/16/24 1450     Visit Number 1    Number of Visits 4    Date for PT Re-Evaluation 03/16/24    Authorization Type Medicare and [employer]    PT Start Time 1404    PT Stop Time 1450    PT Time Calculation (min) 46 min    Activity Tolerance Patient tolerated treatment well    Behavior During Therapy [facility] for tasks assessed/performed          Past Medical History:  Diagnosis Date   Allergy    Anemia    Hx   Anxiety    Occasionally   Arthritis    feet   Basal cell carcinoma    Breast cancer (HCC) 06/2016   left breast   Colon polyps    Depression    Eczema    Genetic testing 08/15/2016   Ms. Ryser underwent genetic testing for hereditary cancer syndrome through [employer]'s 43-gene Common Hereditary Cancers Panel. Ms. Vides testing revealed a single pathogenic mutation in MUTYH and a variant of uncertain significance (VUS) in SDHB. Result report is dated 08/15/2016. Please see genetic counseling documentation from 08/17/2016 for further discussion.   History of radiation therapy 01/02/17-01/31/17   left breast was treated to 42.72 Gy in 16 fractions, left breast boost 10 Gy in 5 fractions   Hyperlipidemia    Personal history of chemotherapy    Personal history of radiation therapy    Pneumonia    2000s   PONV (postoperative nausea and vomiting)    Past Surgical History:  Procedure Laterality Date   BONE SPUR Bilateral 2001 AND 1988   BREAST BIOPSY     BREAST LUMPECTOMY Left    07/2016   BREAST LUMPECTOMY WITH RADIOACTIVE SEED AND SENTINEL LYMPH NODE BIOPSY Left 07/26/2016   Procedure: BREAST LUMPECTOMY WITH RADIOACTIVE SEED AND SENTINEL LYMPH NODE BIOPSY;  Surgeon: Jina Nephew, MD;  Location: [facility];  Service: General;  Laterality: Left;   BUNIONECTOMY Bilateral  02/2012   COLONOSCOPY W/ POLYPECTOMY  08/2007   DILATION AND CURETTAGE OF UTERUS  2002   MOHS SURGERY  2010   PORT-A-CATH REMOVAL N/A 07/18/2017   Procedure: REMOVAL PORT-A-CATH;  Surgeon: Nephew Jina, MD;  Location: [facility];  Service: General;  Laterality: N/A;   PORTACATH PLACEMENT Right 07/26/2016   Procedure: INSERTION PORT-A-CATH;  Surgeon: Jina Nephew, MD;  Location: [facility];  Service: General;  Laterality: Right;   TOTAL HIP ARTHROPLASTY Left 12/31/2022   Procedure: LEFT TOTAL HIP REPLACEMENT;  Surgeon: Jerri Kay CHRISTELLA, MD;  Location: [facility];  Service: Orthopedics;  Laterality: Left;  3-C   Patient Active Problem List   Diagnosis Date Noted   Status post total replacement of left hip 12/31/2022   Prediabetes 11/13/2022   Chronic left hip pain 05/08/2022   Family history of coronary arteriosclerosis 10/13/2021   Primary osteoarthritis of left hip 03/23/2021   Family history of osteoporosis 06/12/2018   History of breast cancer 06/12/2018   Advance directive discussed with patient 04/03/2018   Pap smear of cervix declined 04/03/2018   Lumbago 08/16/2017   Basal cell carcinoma 07/17/2017   Vaginismus 07/17/2017   Abnormal TSH 03/28/2017   Diarrhea 11/05/2016   Port catheter in place 10/23/2016   Genetic testing 08/15/2016   Malignant neoplasm  of upper-outer quadrant of left breast in female, estrogen receptor positive (HCC) 07/10/2016   Allergic rhinitis 06/13/2016   History of basal cell carcinoma 06/13/2016   Eczema 06/13/2016   Hyperlipemia 06/13/2016   History of colon polyps 06/13/2016   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 She was seen for PT immediately after surgery. She gets occasional L glut/SI pain that is worse after driving for long distances. Symptoms are worse with sitting. She has a trip on 9/4 and wants some ideas of how to help with pain.  No h/o stress incontinence or bowel issues.  PERTINENT HISTORY: Breast cancer 2018; chronic L hip pain, h/o chronic low back pain back can go out  PAIN:  Are you having pain? Yes: NPRS scale: 3/10 Pain location: I know it's there can get worse with driving up to 4/89  Pain description: aching Aggravating factors: sitting worsens Relieving factors: movement  PRECAUTIONS: None  RED FLAGS: None   WEIGHT BEARING RESTRICTIONS: No  FALLS:  Has patient fallen in last 6 months? No  LIVING ENVIRONMENT: Lives with: lives with their spouse Lives in: House/apartment Stairs: Yes: Internal: 12-14 steps; one handrail Has following equipment at home: None  OCCUPATION: retired  PLOF: Independent  PATIENT GOALS: Reduce pain to tolerate travel well  OBJECTIVE:  Note: Objective measures were completed at Evaluation unless otherwise noted.  PATIENT SURVEYS:  N/a-- more for self mgmt of glut pain  COGNITION: Overall cognitive status: Within functional limits for tasks assessed     SENSATION: Some areas of numbness around L hip  MUSCLE LENGTH: Hamstrings: Mild tightness in L Hamstring as compared to the [employer] test: WNLs bilat  POSTURE: No Significant postural limitations  PALPATION: Lumbar region is hypomobile--tightness noted in prone press up position  LUMBAR ROM: pain with L sidebending  LOWER EXTREMITY ROM:   mild limitations in hip ER  LOWER EXTREMITY MMT:  Active ROM  Right eval Left eval  Hip flexion 5/5 4/5  Hip extension    Hip abduction 5/5 4+/5  Hip adduction    Hip internal rotation 5/5 4+/5  Hip external rotation 5/5 4/5  Knee flexion 5/5 5/5  Knee extension 5/5 5/5  Ankle dorsiflexion 5/5 4+/5  Ankle plantarflexion 5/5 3/5  Ankle inversion    Ankle eversion     (Blank rows = not tested)  FUNCTIONAL TESTS:  Heel raises reproduce L glut pain  GAIT: Distance walked: mild antalgic pattern noted when first rising  [facility] Adult PT Treatment:                                                DATE: 01/16/24 Therapeutic Exercise: Prone Press ups x 10 reps Supine SLR x 10 reps elbow propping Standing Heel raises with eccentric lowering on L side Hip ER against wall x 10 reps Hip rotation against counter for ER--- unable to tolerate-- modified Seated Hip IR/ER with massage ball Piriformis stretch R and L sides Manual Therapy: STM L glut and discussed self release against wall and while sitting   PATIENT EDUCATION:  Education details: HEP, self massage, glut release Person educated: Patient Education method: Explanation, Demonstration, and Handouts Education comprehension: verbalized understanding and returned demonstration  HOME EXERCISE PROGRAM: Access Code: [email or web address] URL: [email or web address] Date: 01/16/2024 Prepared by: Tawni Ferrier  Exercises - Prone Press Up  - 1 x daily - 7 x weekly - 1 sets - 10 reps - Supine Straight Leg Raise with Elbow Support  - 2 x daily - 3 x weekly - 1 sets - 10 reps - Standing Lumbar Extension with Counter  - 1 x daily - 7 x weekly - 1 sets - 10 reps - Standing Eccentric Heel Raise  - 1 x daily - 3 x weekly - 1 sets - 10-15 reps - Standing Hip External Rotation at Wall  - 1 x daily - 3 x weekly - 1 sets - 5-8 reps - Standing Glute Med Mobilization with Small  Ball on Wall  - 1 x daily - 5 x weekly - 1 sets - 10 reps - Seated Piriformis Release With [employer]  - 1 x daily - 5 x weekly - 1 sets - 1 reps - Seated Figure 4 Piriformis Stretch  - 1 x daily - 5 x weekly - 1 sets - 2 reps - 30 seconds hold  ASSESSMENT:  CLINICAL IMPRESSION: Patient is a 68 y.o. female who was seen today for physical therapy evaluation and treatment for L glut pain s/p L THR in 12/2022. At today's assessment, she presents with impairments in lumbar mobility, weakness in L hip as compared to the R, weakness in L ankle as compared to R, mild tightness in L LE, and pain with palpation. PT initiated HEP today providing activities to help relieve pain while traveling and strengthening exercises. We also discussed a follow up after travels to ensure HEP is addressing pain and symptoms are improving.   OBJECTIVE IMPAIRMENTS: Abnormal gait, decreased strength, hypomobility, and pain.   ACTIVITY LIMITATIONS: sitting  PARTICIPATION LIMITATIONS: travel (driving, flying-- due to sitting)  PERSONAL FACTORS: 1-2 comorbidities: h/o THR, h/o breast cancer, h/o low back pain are also affecting patient's functional outcome.   REHAB POTENTIAL: Good  CLINICAL DECISION MAKING: Evolving/moderate complexity  EVALUATION COMPLEXITY: Moderate   GOALS: Goals reviewed with patient? Yes  SHORT TERM GOALS: Target date: *n/a due to traveling x 4 weeks after evaluation.  LONG TERM GOALS: Target date: 03/16/24  The patient will be indep with HEP. Baseline:  initiated at eval Goal status: INITIAL  2.  The patient will report pain < or equal to 2/10 with extended travel (driving). Baseline:  up to 5/10 Goal status: INITIAL  3.  The patient will report pain at worst is 2/10. Baseline:  5/10 Goal status: INITIAL  4.  The patient will tolerate L heel  raises x 10 reps without glut pain. Baseline:  Sharp pain after 5 reps Goal status: INITIAL  5.  The patient will be indep with gym  routine to address remaining weakness. Baseline:  strength training at [facility] as part of her routine-- may add gym activities Goal status: INITIAL  PLAN:  PT FREQUENCY: 1-2x/week  PT DURATION: 4 weeks  PLANNED INTERVENTIONS: [id number]- PT Re-evaluation, 97750- Physical Performance Testing, 97110-Therapeutic exercises, 97530- Therapeutic activity, [id number]- Neuromuscular re-education, 97535- Self Care, [id number]- Manual therapy, Patient/Family education, Joint mobilization, Cryotherapy, and Moist heat  PLAN FOR NEXT SESSION: check HEP and progress to tolerance.    Jerrine Urschel, PT 01/16/2024, 2:50 PM

## 2024-01-21 ENCOUNTER — Encounter: Payer: Self-pay | Admitting: Sports Medicine

## 2024-01-27 ENCOUNTER — Encounter: Payer: Self-pay | Admitting: Family Medicine

## 2024-01-28 ENCOUNTER — Other Ambulatory Visit: Payer: Self-pay | Admitting: *Deleted

## 2024-01-28 ENCOUNTER — Encounter: Payer: Self-pay | Admitting: *Deleted

## 2024-01-28 DIAGNOSIS — G2581 Restless legs syndrome: Secondary | ICD-10-CM

## 2024-01-28 MED ORDER — ROPINIROLE HCL 0.25 MG PO TABS
0.2500 mg | ORAL_TABLET | Freq: Every day | ORAL | 3 refills | Status: AC
Start: 2024-01-28 — End: ?

## 2024-02-20 ENCOUNTER — Ambulatory Visit: Attending: Orthopaedic Surgery | Admitting: Rehabilitative and Restorative Service Providers"

## 2024-02-20 ENCOUNTER — Encounter: Payer: Self-pay | Admitting: Rehabilitative and Restorative Service Providers"

## 2024-02-20 DIAGNOSIS — M25552 Pain in left hip: Secondary | ICD-10-CM | POA: Insufficient documentation

## 2024-02-20 DIAGNOSIS — R2689 Other abnormalities of gait and mobility: Secondary | ICD-10-CM | POA: Insufficient documentation

## 2024-02-20 DIAGNOSIS — R29898 Other symptoms and signs involving the musculoskeletal system: Secondary | ICD-10-CM | POA: Insufficient documentation

## 2024-02-20 DIAGNOSIS — M6281 Muscle weakness (generalized): Secondary | ICD-10-CM | POA: Insufficient documentation

## 2024-02-20 DIAGNOSIS — M25652 Stiffness of left hip, not elsewhere classified: Secondary | ICD-10-CM | POA: Diagnosis present

## 2024-02-20 NOTE — Therapy (Addendum)
 OUTPATIENT PHYSICAL THERAPY LOWER EXTREMITY TREATMENT AND DISCHARGE SUMMARY   Patient Name: Jamie [name] MRN: [id number] DOB:08-Mar-1956, 68 y.o., female Today's Date: 02/20/2024   PHYSICAL THERAPY DISCHARGE SUMMARY  Visits from Start of Care: 2  Current functional level related to goals / functional outcomes: See goals below   Remaining deficits: See below for last known status-- patient was meeting goals early   Education / Equipment: HEP   Patient agrees to discharge. Patient goals were partially met. Patient is being discharged due to meeting the stated rehab goals.   END OF SESSION:  PT End of Session - 02/20/24 1403     Visit Number 2    Number of Visits 4    Date for Recertification  03/16/24    Authorization Type Medicare and [employer]    PT Start Time 1403    PT Stop Time 1441    PT Time Calculation (min) 38 min    Activity Tolerance Patient tolerated treatment well    Behavior During Therapy [facility] for tasks assessed/performed          Past Medical History:  Diagnosis Date   Allergy    Anemia    Hx   Anxiety    Occasionally   Arthritis    feet   Basal cell carcinoma    Breast cancer (HCC) 06/2016   left breast   Colon polyps    Depression    Eczema    Genetic testing 08/15/2016   Ms. Labine underwent genetic testing for hereditary cancer syndrome through [employer]'s 43-gene Common Hereditary Cancers Panel. Ms. 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 Result report is dated 08/15/2016. Please see genetic counseling documentation from 08/17/2016 for further discussion.   History of radiation therapy 01/02/17-01/31/17   left breast was treated to 42.72 Gy in 16 fractions, left breast boost 10 Gy in 5 fractions   Hyperlipidemia    Personal history of chemotherapy    Personal history of radiation therapy    Pneumonia    2000s   PONV (postoperative nausea and vomiting)    Past  Surgical History:  Procedure Laterality Date   BONE SPUR Bilateral 2001 AND 1988   BREAST BIOPSY     BREAST LUMPECTOMY Left    07/2016   BREAST LUMPECTOMY WITH RADIOACTIVE SEED AND SENTINEL LYMPH NODE BIOPSY Left 07/26/2016   Procedure: BREAST LUMPECTOMY WITH RADIOACTIVE SEED AND SENTINEL LYMPH NODE BIOPSY;  Surgeon: Jina Nephew, MD;  Location: [facility];  Service: General;  Laterality: Left;   BUNIONECTOMY Bilateral 02/2012   COLONOSCOPY W/ POLYPECTOMY  08/2007   DILATION AND CURETTAGE OF UTERUS  2002   MOHS SURGERY  2010   PORT-A-CATH REMOVAL N/A 07/18/2017   Procedure: REMOVAL PORT-A-CATH;  Surgeon: Nephew Jina, MD;  Location: [facility] OR;  Service: General;  Laterality: N/A;   PORTACATH PLACEMENT Right 07/26/2016   Procedure: INSERTION PORT-A-CATH;  Surgeon: Jina Nephew, MD;  Location: [facility];  Service: General;  Laterality: Right;   TOTAL HIP ARTHROPLASTY Left 12/31/2022   Procedure: LEFT TOTAL HIP REPLACEMENT;  Surgeon: Jerri Kay CHRISTELLA, MD;  Location: [facility] OR;  Service: Orthopedics;  Laterality: Left;  3-C   Patient Active Problem List   Diagnosis Date Noted   Status post total replacement of left hip 12/31/2022   Prediabetes 11/13/2022   Chronic left hip pain 05/08/2022   Family history of coronary arteriosclerosis 10/13/2021   Primary osteoarthritis  of left hip 03/23/2021   Family history of osteoporosis 06/12/2018   History of breast cancer 06/12/2018   Advance directive discussed with patient 04/03/2018   Pap smear of cervix declined 04/03/2018   Lumbago 08/16/2017   Basal cell carcinoma 07/17/2017   Vaginismus 07/17/2017   Abnormal TSH 03/28/2017   Diarrhea 11/05/2016   Port catheter in place 10/23/2016   Genetic testing 08/15/2016   Malignant neoplasm of upper-outer quadrant of left breast in female, estrogen receptor positive (HCC) 07/10/2016   Allergic rhinitis 06/13/2016   History of basal cell carcinoma 06/13/2016   Eczema 06/13/2016    Hyperlipemia 06/13/2016   History of colon polyps 06/13/2016   Plantar fascial fibromatosis 06/13/2016   RLS (restless legs syndrome) 06/13/2016   Subclinical hypothyroidism 07/20/2013   Bunion 01/21/2013   Osteopenia 01/20/2013    PCP: Jenkins Earnie Carrel, MD REFERRING PROVIDER: Jerri Moody, MD REFERRING DIAG:   [phone] (ICD-10-CM) - History of left hip replacement    THERAPY DIAG:  Pain in left hip  Muscle weakness (generalized)  Other symptoms and signs involving the musculoskeletal system  Stiffness of left hip, not elsewhere classified  Other abnormalities of gait and mobility  Rationale for Evaluation and Treatment: Rehabilitation  ONSET DATE: 01/09/24  SUBJECTIVE:   SUBJECTIVE STATEMENT: The patient reports that she traveled for a month with no hip pain. She walked a lot on vacation with no hip pain. She feels like pain returned once she got home.   EVAL: The patient reports she underwent total hip replacement last August 2024. She was seen for PT immediately after surgery. She gets occasional L glut/SI pain that is worse after driving for long distances. Symptoms are worse with sitting. She has a trip on 9/4 and wants some ideas of how to help with pain.  No h/o stress incontinence or bowel issues.  PERTINENT HISTORY: Breast cancer 2018; chronic L hip pain, h/o chronic low back pain back can go out  PAIN:  Are you having pain? Yes: NPRS scale: 3/10 Pain location: I know it's there can get worse with driving up to 4/89  Pain description: aching Aggravating factors: sitting worsens Relieving factors: movement  PRECAUTIONS: None  WEIGHT BEARING RESTRICTIONS: No  FALLS:  Has patient fallen in last 6 months? No  LIVING ENVIRONMENT: Lives with: lives with their spouse Lives in: House/apartment Stairs: Yes: Internal: 12-14 steps; one handrail Has following equipment at home: None  OCCUPATION: retired  PLOF: Independent  PATIENT GOALS: Reduce pain to  tolerate travel well  OBJECTIVE:  Note: Objective measures were completed at Evaluation unless otherwise noted.  PATIENT SURVEYS:  N/a-- more for self mgmt of glut pain  COGNITION: Overall cognitive status: Within functional limits for tasks assessed     SENSATION: Some areas of numbness around L hip  MUSCLE LENGTH: Hamstrings: Mild tightness in L Hamstring as compared to the [employer] test: WNLs bilat  POSTURE: No Significant postural limitations  PALPATION: Lumbar region is hypomobile--tightness noted in prone press up position  LUMBAR ROM: pain with L sidebending  LOWER EXTREMITY ROM:   mild limitations in hip ER  LOWER EXTREMITY MMT:  Active ROM Right eval Left eval  Hip flexion 5/5 4/5  Hip extension    Hip abduction 5/5 4+/5  Hip adduction    Hip internal rotation 5/5 4+/5  Hip external rotation 5/5 4/5  Knee flexion 5/5 5/5  Knee extension 5/5 5/5  Ankle dorsiflexion 5/5 4+/5  Ankle plantarflexion 5/5 3/5  Ankle  inversion    Ankle eversion     (Blank rows = not tested)  FUNCTIONAL TESTS:  Heel raises reproduce L glut pain  GAIT: Distance walked: mild antalgic pattern noted when first rising                            [facility] Adult PT Treatment:                                                DATE: 02/20/24 Therapeutic Exercise: Standing near a wall doing hip ER Seated piriformis stretch Therapeutic Activity: Seated toe yoga working on foot mobility  Standing heel rases off edge of step Standing single limb heel raises x 10 reps-- much improved from evaluation Standing L trunk leaning that no longer provokes pain Sidestepping without band X band side stepping Self Care: We discussed factors that changed upon her return home since no pain on vacation.  She stands and moves around the house barefoot-- recommended trying an indoor supported slide (oofos) She is doing an exercise that provokes mild discomfort  We discussed home strengthening Recommended  toe yoga and x band walk to add to current program We discussed home gym routine Patient does strengthening regularly and has a good routine                                                                                                   [facility] Adult PT Treatment:                                                DATE: 01/16/24 Therapeutic Exercise: Prone Press ups x 10 reps Supine SLR x 10 reps elbow propping Standing Heel raises with eccentric lowering on L side Hip ER against wall x 10 reps Hip rotation against counter for ER--- unable to tolerate-- modified Seated Hip IR/ER with massage ball Piriformis stretch R and L sides Manual Therapy: STM L glut and discussed self release against wall and while sitting   PATIENT EDUCATION:  Education details: HEP, self massage, glut release Person educated: Patient Education method: Explanation, Demonstration, and Handouts Education comprehension: verbalized understanding and returned demonstration  HOME EXERCISE PROGRAM: Access Code: [email or web address] URL: [email or web address] Date: 02/20/2024 Prepared by: Tawni Ferrier  Exercises - Prone Press Up  - 1 x daily - 7 x weekly - 1 sets - 10 reps - Supine Straight Leg Raise with Elbow Support  - 2 x daily - 3 x weekly - 1 sets - 10 reps - Standing Lumbar Extension with Counter  - 1 x daily - 7 x weekly - 1 sets - 10 reps - Standing Eccentric Heel Raise  - 1 x daily - 3 x weekly - 1 sets - 10-15 reps - Standing Hip External Rotation at Wall  -  1 x daily - 3 x weekly - 1 sets - 5-8 reps - Standing Glute Med Mobilization with Small Ball on Wall  - 1 x daily - 5 x weekly - 1 sets - 10 reps - Seated Piriformis Release With [employer]  - 1 x daily - 5 x weekly - 1 sets - 1 reps - Seated Figure 4 Piriformis Stretch  - 1 x daily - 5 x weekly - 1 sets - 2 reps - 30 seconds hold - Toe Yoga - Alternating Great Toe and Lesser Toe Extension  - 2 x daily - 7 x weekly - 1 sets - 10  reps  ASSESSMENT:  CLINICAL IMPRESSION: The patient has met 4 LTGs. PT keeping goal # 1 open for patient to continue strengthening routine and call if hip/back flare and she needs to return. Due to her activity level, gym participation, and adherence to a plan, PT to have patient continue working on HEP and call if she needs any return to care before the end of her plan of care (10/27). Patient made gains in gastroc strength per single limb heel raises.   EVAL: Patient is a 68 y.o. female who was seen today for physical therapy evaluation and treatment for L glut pain s/p L THR in 12/2022. At today's assessment, she presents with impairments in lumbar mobility, weakness in L hip as compared to the R, weakness in L ankle as compared to R, mild tightness in L LE, and pain with palpation. PT initiated HEP today providing activities to help relieve pain while traveling and strengthening exercises. We also discussed a follow up after travels to ensure HEP is addressing pain and symptoms are improving.   OBJECTIVE IMPAIRMENTS: Abnormal gait, decreased strength, hypomobility, and pain.   GOALS: Goals reviewed with patient? Yes  SHORT TERM GOALS: Target date: *n/a due to traveling x 4 weeks after evaluation.  LONG TERM GOALS: Target date: 03/16/24  The patient will be indep with HEP. Baseline:  initiated at eval Goal status: ONGOING-- to check in if any questions remain re: exercises.  2.  The patient will report pain < or equal to 2/10 with extended travel (driving). Baseline:  up to 5/10 Goal status: MET  3.  The patient will report pain at worst is 2/10. Baseline:  5/10 Goal status: MET  4.  The patient will tolerate L heel raises x 10 reps without glut pain. Baseline:  Sharp pain after 5 reps Goal status: MET  5.  The patient will be indep with gym routine to address remaining weakness. Baseline:  strength training at [facility] as part of her routine-- may add gym activities Goal status:  MET  PLAN:  PT FREQUENCY: 1-2x/week  PT DURATION: 4 weeks  PLANNED INTERVENTIONS: 97164- PT Re-evaluation, 97750- Physical Performance Testing, 97110-Therapeutic exercises, 97530- Therapeutic activity, [id number]- Neuromuscular re-education, 97535- Self Care, [id number]- Manual therapy, Patient/Family education, Joint mobilization, Cryotherapy, and Moist heat  PLAN FOR NEXT SESSION:  Patient d/c plan on 10/27-- will call if anything arises between now and then.   Dannon Perlow, PT 02/20/2024, 3:11 PM

## 2024-03-20 ENCOUNTER — Telehealth: Admitting: Physician Assistant

## 2024-03-20 DIAGNOSIS — K648 Other hemorrhoids: Secondary | ICD-10-CM

## 2024-03-20 MED ORDER — HYDROCORTISONE ACETATE 25 MG RE SUPP: 25.0000 mg | Freq: Two times a day (BID) | RECTAL | 0 refills | Status: AC

## 2024-03-20 NOTE — Progress Notes (Signed)

## 2024-03-23 ENCOUNTER — Encounter: Payer: Self-pay | Admitting: Radiology

## 2024-04-09 ENCOUNTER — Telehealth: Payer: Self-pay | Admitting: *Deleted

## 2024-04-09 DIAGNOSIS — L97512 Non-pressure chronic ulcer of other part of right foot with fat layer exposed: Secondary | ICD-10-CM

## 2024-04-09 NOTE — Telephone Encounter (Signed)
 I called and asked the patient to arrive 20 prior to her appointment and stop by imaging for xrays prior to coming to us .

## 2024-04-10 ENCOUNTER — Ambulatory Visit

## 2024-04-10 ENCOUNTER — Ambulatory Visit (INDEPENDENT_AMBULATORY_CARE_PROVIDER_SITE_OTHER): Payer: Self-pay | Admitting: Podiatry

## 2024-04-10 ENCOUNTER — Encounter: Payer: Self-pay | Admitting: Podiatry

## 2024-04-10 DIAGNOSIS — L97512 Non-pressure chronic ulcer of other part of right foot with fat layer exposed: Secondary | ICD-10-CM

## 2024-04-10 DIAGNOSIS — M898X9 Other specified disorders of bone, unspecified site: Secondary | ICD-10-CM

## 2024-04-10 NOTE — Progress Notes (Signed)
  Subjective:  Patient ID: Jamie [name], female    DOB: Oct 10, 1955,   MRN: [id number]  Chief Complaint  Patient presents with   Toe Pain    I have pain and irritation between my big toe and the toe next to it on my right foot.  When I put Neosporin on it, it doesn't hurt.    68 y.o. female presents for concern of sore in between her right great and second toe.  This has been present for about a year.  She has tried padding Band-Aids and callus remover's.  She relates it will get better at times but then it will come back.  She is wondering what else she can do.  She has a history of bunion surgery bilateral feet.  She has had it twice on her right foot.. Denies any other pedal complaints. Denies n/v/f/c.   Past Medical History:  Diagnosis Date   Allergy    Anemia    Hx   Anxiety    Occasionally   Arthritis    feet   Basal cell carcinoma    Breast cancer (HCC) 06/2016   left breast   Colon polyps    Depression    Eczema    Genetic testing 08/15/2016   Ms. Cast underwent genetic testing for hereditary cancer syndrome through [employer]'s 43-gene Common Hereditary Cancers Panel. Ms. 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 Result report is dated 08/15/2016. Please see genetic counseling documentation from 08/17/2016 for further discussion.   History of radiation therapy 01/02/17-01/31/17   left breast was treated to 42.72 Gy in 16 fractions, left breast boost 10 Gy in 5 fractions   Hyperlipidemia    Personal history of chemotherapy    Personal history of radiation therapy    Pneumonia    2000s   PONV (postoperative nausea and vomiting)     Objective:  Physical Exam: Vascular: DP/PT pulses 2/4 bilateral. CFT <3 seconds. Normal hair growth on digits. No edema.  Skin. No lacerations or abrasions bilateral feet.  Hyperkeratotic lesion noted to medial aspect of second digit on right foot. Musculoskeletal:  MMT 5/5 bilateral lower extremities in DF, PF, Inversion and Eversion. Deceased ROM in DF of ankle joint.  No major hammering right foot.  Hallux and second digit do abut each other.  Neurological: Sensation intact to light touch.   Assessment:   1. Bony exostosis      Plan:  Patient was evaluated and treated and all questions answered. -X-rays reviewed.  No acute fractures or dislocations noted.  Significant degenerative changes noted to the first MPJ.  Prominence noted to the lateral base of phalanx of hallux.  Noted prominence to medial aspect of middle phalanx of second digit as well. -Educated on exostosis and heloma molle and treatment options  -Hyperkeratotic lesion debrided as courtesy today without incident. -Discussed padding including toe caps and crest pads.  -Discussed need for potential surgery if pain does not improved.  Discussed potential removal of bony abscess. -Patient to follow-up as needed. Discussed calling if any changes or increased pain.    Asberry Failing, DPM

## 2024-04-23 ENCOUNTER — Telehealth: Payer: Self-pay | Admitting: Orthopaedic Surgery

## 2024-04-23 NOTE — Telephone Encounter (Signed)
 Yes that's fine

## 2024-04-23 NOTE — Telephone Encounter (Signed)
 Pt called saying that she had a hip replacement in Aug 2023 and wants to know if she can go sledding or tubing. Callback number is [phone]

## 2024-04-23 NOTE — Telephone Encounter (Signed)
 Called and let patient know

## 2024-06-01 ENCOUNTER — Other Ambulatory Visit: Payer: Self-pay | Admitting: Family Medicine

## 2024-06-01 DIAGNOSIS — Z1231 Encounter for screening mammogram for malignant neoplasm of breast: Secondary | ICD-10-CM

## 2024-08-18 ENCOUNTER — Ambulatory Visit

## 2024-12-07 ENCOUNTER — Ambulatory Visit
